# Patient Record
Sex: Male | Born: 1990 | Race: White | Hispanic: No | Marital: Single | Smoking: Former smoker
Health system: Southern US, Community
[De-identification: ages and names within clinical notes are randomized; demographics above are authoritative.]

## PROBLEM LIST (undated history)

## (undated) DIAGNOSIS — Z21 Asymptomatic human immunodeficiency virus [HIV] infection status: Secondary | ICD-10-CM

## (undated) DIAGNOSIS — Z789 Other specified health status: Secondary | ICD-10-CM

## (undated) DIAGNOSIS — C6290 Malignant neoplasm of unspecified testis, unspecified whether descended or undescended: Secondary | ICD-10-CM

## (undated) HISTORY — PX: NO PAST SURGERIES: SHX2092

## (undated) HISTORY — DX: Asymptomatic human immunodeficiency virus (hiv) infection status: Z21

## (undated) HISTORY — DX: Malignant neoplasm of unspecified testis, unspecified whether descended or undescended: C62.90

## (undated) MED FILL — Fosaprepitant Dimeglumine For IV Infusion 150 MG (Base Eq): INTRAVENOUS | Qty: 5 | Status: AC

---

## 2009-05-05 ENCOUNTER — Emergency Department (HOSPITAL_BASED_OUTPATIENT_CLINIC_OR_DEPARTMENT_OTHER): Admission: EM | Admit: 2009-05-05 | Discharge: 2009-05-05 | Payer: Self-pay | Admitting: Emergency Medicine

## 2012-02-11 ENCOUNTER — Emergency Department (HOSPITAL_BASED_OUTPATIENT_CLINIC_OR_DEPARTMENT_OTHER)
Admission: EM | Admit: 2012-02-11 | Discharge: 2012-02-11 | Disposition: A | Discharge status: Home / Self Care | Payer: BC Managed Care – PPO | Attending: Emergency Medicine | Admitting: Emergency Medicine

## 2012-02-11 ENCOUNTER — Encounter (HOSPITAL_BASED_OUTPATIENT_CLINIC_OR_DEPARTMENT_OTHER): Payer: Self-pay | Admitting: *Deleted

## 2012-02-11 ENCOUNTER — Emergency Department (HOSPITAL_BASED_OUTPATIENT_CLINIC_OR_DEPARTMENT_OTHER): Payer: BC Managed Care – PPO

## 2012-02-11 DIAGNOSIS — Y998 Other external cause status: Secondary | ICD-10-CM | POA: Insufficient documentation

## 2012-02-11 DIAGNOSIS — Y92009 Unspecified place in unspecified non-institutional (private) residence as the place of occurrence of the external cause: Secondary | ICD-10-CM | POA: Insufficient documentation

## 2012-02-11 DIAGNOSIS — S91319A Laceration without foreign body, unspecified foot, initial encounter: Secondary | ICD-10-CM

## 2012-02-11 DIAGNOSIS — Y9301 Activity, walking, marching and hiking: Secondary | ICD-10-CM | POA: Insufficient documentation

## 2012-02-11 DIAGNOSIS — S91309A Unspecified open wound, unspecified foot, initial encounter: Secondary | ICD-10-CM | POA: Insufficient documentation

## 2012-02-11 DIAGNOSIS — W268XXA Contact with other sharp object(s), not elsewhere classified, initial encounter: Secondary | ICD-10-CM | POA: Insufficient documentation

## 2012-02-11 MED ORDER — CEPHALEXIN 500 MG PO CAPS: 500.0000 mg | ORAL_CAPSULE | Freq: Four times a day (QID) | ORAL | Status: AC

## 2012-02-11 MED ORDER — CEPHALEXIN 250 MG PO CAPS
500.0000 mg | ORAL_CAPSULE | Freq: Once | ORAL | Status: AC
  Administered 2012-02-11: 500 mg via ORAL
  Filled 2012-02-11: qty 2

## 2012-02-11 MED ORDER — LIDOCAINE HCL 2 % IJ SOLN
INTRAMUSCULAR | Status: AC
  Filled 2012-02-11: qty 1

## 2012-02-11 MED ORDER — TETANUS-DIPHTH-ACELL PERTUSSIS 5-2.5-18.5 LF-MCG/0.5 IM SUSP
0.5000 mL | Freq: Once | INTRAMUSCULAR | Status: AC
  Administered 2012-02-11: 0.5 mL via INTRAMUSCULAR
  Filled 2012-02-11: qty 0.5

## 2012-02-11 NOTE — ED Provider Notes (Signed)
History     CSN: [id number]  Arrival date & time 02/11/12  0809   First MD Initiated Contact with Patient 02/11/12 [phone]      Chief Complaint  Patient presents with  . Extremity Laceration    (Consider location/radiation/quality/duration/timing/severity/associated sxs/prior treatment) HPI Pt reports while cooking out in his yard yesterday he stepped on a large piece of glass, sustaining a laceration to the bottom of his L foot. He pulled the glass out but did not seek medical attention at that time. Complaining of mild pain to bottom of foot.   History reviewed. No pertinent past medical history.  History reviewed. No pertinent past surgical history.  History reviewed. No pertinent family history.  History  Substance Use Topics  . Smoking status: Never Smoker   . Smokeless tobacco: Not on file  . Alcohol Use: No      Review of Systems All other systems reviewed and are negative except as noted in HPI.   Allergies  Review of patient's allergies indicates no known allergies.  Home Medications  No current outpatient prescriptions on file.  BP 132/72  Pulse 85  Resp 14  Ht 5\' 11"  (1.803 m)  Wt 230 lb (104.327 kg)  BMI 32.08 kg/m2  SpO2 100%  Physical Exam  Nursing note and vitals reviewed. Constitutional: He is oriented to person, place, and time. He appears well-developed and well-nourished.  HENT:  Head: Normocephalic and atraumatic.  Eyes: EOM are normal. Pupils are equal, round, and reactive to light.  Neck: Normal range of motion. Neck supple.  Cardiovascular: Normal rate, normal heart sounds and intact distal pulses.   Pulmonary/Chest: Effort normal and breath sounds normal.  Abdominal: Bowel sounds are normal. He exhibits no distension. There is no tenderness.  Musculoskeletal: Normal range of motion. He exhibits no edema and no tenderness.  Neurological: He is alert and oriented to person, place, and time. He has normal strength. No cranial nerve  deficit or sensory deficit.  Skin: Skin is warm and dry.       3cm linear laceration to bottom of L foot  Psychiatric: He has a normal mood and affect.    ED Course  Procedures (including critical care time)  Labs Reviewed - No data to display No results found.   No diagnosis found.    MDM  Discussed with patient that his injury is too remote for primary closure due to infection risk. Will check xray to eval for retained foreign body. Tdap updated. Wound irrigation and prophylactic antibiotics.         Tula Schryver B. Bernette Mayers, MD 02/11/12 [phone]

## 2012-02-11 NOTE — ED Notes (Signed)
Stepped on glass yesterday around 11am, laceration to L foot

## 2012-06-16 ENCOUNTER — Encounter (HOSPITAL_BASED_OUTPATIENT_CLINIC_OR_DEPARTMENT_OTHER): Payer: Self-pay | Admitting: *Deleted

## 2012-06-16 ENCOUNTER — Emergency Department (HOSPITAL_BASED_OUTPATIENT_CLINIC_OR_DEPARTMENT_OTHER)
Admission: EM | Admit: 2012-06-16 | Discharge: 2012-06-16 | Disposition: A | Discharge status: Home / Self Care | Payer: BC Managed Care – PPO | Attending: Emergency Medicine | Admitting: Emergency Medicine

## 2012-06-16 DIAGNOSIS — F4320 Adjustment disorder, unspecified: Secondary | ICD-10-CM

## 2012-06-16 DIAGNOSIS — F39 Unspecified mood [affective] disorder: Secondary | ICD-10-CM | POA: Insufficient documentation

## 2012-06-16 DIAGNOSIS — R630 Anorexia: Secondary | ICD-10-CM | POA: Insufficient documentation

## 2012-06-16 DIAGNOSIS — F17200 Nicotine dependence, unspecified, uncomplicated: Secondary | ICD-10-CM | POA: Insufficient documentation

## 2012-06-16 LAB — CBC WITH DIFFERENTIAL/PLATELET
Basophils Absolute: 0 K/uL (ref 0.0–0.1)
Basophils Relative: 0 % (ref 0–1)
Eosinophils Absolute: 0.2 K/uL (ref 0.0–0.7)
Eosinophils Relative: 2 % (ref 0–5)
HCT: 43.5 % (ref 39.0–52.0)
Hemoglobin: 15.1 g/dL (ref 13.0–17.0)
Lymphocytes Relative: 31 % (ref 12–46)
Lymphs Abs: 3.2 K/uL (ref 0.7–4.0)
MCH: 29.8 pg (ref 26.0–34.0)
MCHC: 34.7 g/dL (ref 30.0–36.0)
MCV: 85.8 fL (ref 78.0–100.0)
Monocytes Absolute: 1 K/uL (ref 0.1–1.0)
Monocytes Relative: 9 % (ref 3–12)
Neutro Abs: 6 K/uL (ref 1.7–7.7)
Neutrophils Relative %: 57 % (ref 43–77)
Platelets: 150 K/uL (ref 150–400)
RBC: 5.07 MIL/uL (ref 4.22–5.81)
RDW: 13 % (ref 11.5–15.5)
WBC: 10.4 K/uL (ref 4.0–10.5)

## 2012-06-16 LAB — BASIC METABOLIC PANEL WITH GFR
BUN: 11 mg/dL (ref 6–23)
CO2: 23 meq/L (ref 19–32)
Calcium: 9.4 mg/dL (ref 8.4–10.5)
Chloride: 102 meq/L (ref 96–112)
Creatinine, Ser: 1 mg/dL (ref 0.50–1.35)
GFR calc Af Amer: >90 mL/min (ref >90)
GFR calc non Af Amer: >90 mL/min (ref >90)
Glucose, Bld: 97 mg/dL (ref 70–99)
Potassium: 3.6 meq/L (ref 3.5–5.1)
Sodium: 138 meq/L (ref 135–145)

## 2012-06-16 LAB — RAPID URINE DRUG SCREEN, HOSP PERFORMED
Amphetamines: NOT DETECTED
Barbiturates: NOT DETECTED
Tetrahydrocannabinol: POSITIVE — AB

## 2012-06-16 NOTE — ED Notes (Signed)
Pt states that he has been feeling "down", states that he has been feeling depressed.  Pt states that this feeling began 4-5 days ago.  No specific event triggered this feeling.  Pt states that he has also been having generalized body aches and dizzyness with this.  Pt denies any SI or HI

## 2012-06-16 NOTE — ED Notes (Signed)
Called [facility] to have community resource information sent over to be given to pt.  I spoke with Trey Paula.

## 2012-06-16 NOTE — ED Notes (Signed)
[facility] called. 

## 2012-06-16 NOTE — ED Provider Notes (Signed)
History  This chart was scribed for Sequoyah Counterman Smitty Cords, MD by Ladona Ridgel Day. This patient was seen in room MH05/MH05 and the patient's care was started at 0014.   CSN: [id number]  Arrival date & time 06/16/12  0014   First MD Initiated Contact with Patient 06/16/12 0021      Chief Complaint  Patient presents with  . Depression   Patient is a 21 y.o. male presenting with mental health disorder. The history is provided by the patient. No language interpreter was used.  Mental Health Problem The primary symptoms include dysphoric mood. The primary symptoms do not include delusions, hallucinations, bizarre behavior, negative symptoms or somatic symptoms. The current episode started this week. This is a new problem.  The onset of the illness is precipitated by emotional stress. The degree of incapacity that he is experiencing as a consequence of his illness is mild. Sequelae: none. Additional symptoms of the illness include appetite change (decreased appetite). Additional symptoms of the illness do not include no anhedonia, no insomnia, no hypersomnia, no unexpected weight change, no fatigue, no agitation, no flight of ideas, no abdominal pain or no seizures. He does not admit to suicidal ideas. He does not have a plan to commit suicide. He does not contemplate harming himself. He has not already injured self. He does not contemplate injuring another person. He has not already  injured another person. Risk factors: none.   Steve Stewart is a 21 y.o. male who presents to the Emergency Department complaining of constant gradually worsening feeling down and depressed over the past 5 days associated with long work hours. He states associated sx of feeling shaky. He states no specific set of events that have led to him feeling down now except that he has been working long hours at work. He denies any SI/HI, hearing voices, hallucinations, fever, emesis, rash, diarrhea. He has not yet seen anyone for this  problem. He denies any history or family history of mental health problems. He takes no medicines, has no medical history, no allergies, no surgeries, no recent travel.   History reviewed. No pertinent past medical history.  History reviewed. No pertinent past surgical history.  No family history on file.  History  Substance Use Topics  . Smoking status: Current Every Day Smoker -- 0.5 packs/day    Types: Cigarettes  . Smokeless tobacco: Not on file  . Alcohol Use: No      Review of Systems  Constitutional: Positive for appetite change (decreased appetite). Negative for fever, chills, fatigue and unexpected weight change.  Respiratory: Negative for shortness of breath.   Gastrointestinal: Negative for nausea, vomiting and abdominal pain.  Neurological: Negative for seizures and weakness.  Psychiatric/Behavioral: Positive for dysphoric mood. Negative for suicidal ideas, hallucinations, behavioral problems, self-injury and agitation. The patient is not nervous/anxious and does not have insomnia.   All other systems reviewed and are negative.    Allergies  Review of patient's allergies indicates no known allergies.  Home Medications  No current outpatient prescriptions on file.  Triage vitals: BP 128/78  Pulse 92  Temp 98.6 F (37 C) (Oral)  Resp 16  Ht 5\' 11"  (1.803 m)  Wt 217 lb (98.431 kg)  BMI 30.27 kg/m2  SpO2 100%  Physical Exam  Nursing note and vitals reviewed. Constitutional: He is oriented to person, place, and time. He appears well-developed and well-nourished. No distress.  HENT:  Head: Normocephalic and atraumatic.  Mouth/Throat: Oropharynx is clear and moist.  Eyes: EOM are  normal. Pupils are equal, round, and reactive to light.  Neck: Neck supple. No tracheal deviation present.  Cardiovascular: Normal rate, regular rhythm and normal heart sounds.   No murmur heard. Pulmonary/Chest: Effort normal and breath sounds normal. No respiratory distress. He  has no wheezes. He has no rales.  Abdominal: Soft. Bowel sounds are normal. He exhibits no distension. There is no tenderness. There is no rebound and no guarding.  Musculoskeletal: Normal range of motion.  Neurological: He is alert and oriented to person, place, and time.  Skin: Skin is warm and dry.  Psychiatric: He has a normal mood and affect. His behavior is normal.    ED Course  Procedures (including critical care time) DIAGNOSTIC STUDIES: Oxygen Saturation is 100% on room air, normal by my interpretation.    COORDINATION OF CARE: At 1240 AM Discussed treatment plan with patient which includes UD, blood work. Patient agrees.   Labs Reviewed - No data to display No results found.   No diagnosis found.    MDM   no si or hi no indications for admission.  Community resources given.    I personally performed the services described in this documentation, which was scribed in my presence. The recorded information has been reviewed and considered.           Jasmine Awe, MD 06/16/12 [phone]

## 2012-08-30 ENCOUNTER — Emergency Department (HOSPITAL_BASED_OUTPATIENT_CLINIC_OR_DEPARTMENT_OTHER)
Admission: EM | Admit: 2012-08-30 | Discharge: 2012-08-30 | Disposition: A | Discharge status: Home / Self Care | Payer: BC Managed Care – PPO | Attending: Emergency Medicine | Admitting: Emergency Medicine

## 2012-08-30 ENCOUNTER — Encounter (HOSPITAL_BASED_OUTPATIENT_CLINIC_OR_DEPARTMENT_OTHER): Payer: Self-pay | Admitting: Emergency Medicine

## 2012-08-30 DIAGNOSIS — M62838 Other muscle spasm: Secondary | ICD-10-CM | POA: Insufficient documentation

## 2012-08-30 DIAGNOSIS — F17200 Nicotine dependence, unspecified, uncomplicated: Secondary | ICD-10-CM | POA: Insufficient documentation

## 2012-08-30 MED ORDER — IBUPROFEN 800 MG PO TABS: 800.0000 mg | ORAL_TABLET | Freq: Three times a day (TID) | ORAL | Status: DC

## 2012-08-30 MED ORDER — IBUPROFEN 800 MG PO TABS
800.0000 mg | ORAL_TABLET | Freq: Once | ORAL | Status: AC
  Administered 2012-08-30: 800 mg via ORAL
  Filled 2012-08-30: qty 1

## 2012-08-30 MED ORDER — DIAZEPAM 5 MG PO TABS: 5.0000 mg | ORAL_TABLET | Freq: Two times a day (BID) | ORAL | Status: DC

## 2012-08-30 MED ORDER — HYDROCODONE-ACETAMINOPHEN 5-325 MG PO TABS: 1.0000 | ORAL_TABLET | Freq: Four times a day (QID) | ORAL | Status: DC | PRN

## 2012-08-30 NOTE — ED Provider Notes (Signed)
History     CSN: [id number]  Arrival date & time 08/30/12  0865   First MD Initiated Contact with Patient 08/30/12 [phone]      Chief Complaint  Patient presents with  . Shoulder Pain    (Consider location/radiation/quality/duration/timing/severity/associated sxs/prior treatment) HPICorey Stewart is a 22 y.o. male says that he was moving heavy furniture approximately second or third of January over the course of the last couple days he's had an increasingly worse pain in his right trapezius. He's had difficulty sleeping because the pain tonight. Pain is severe, sharp, worse when he elevates his shoulder, or presses on it. No fevers, no chills, no numbness, tingling, no weakness in the right arm.  No cough, chest pain, shortness of breath, abdominal pain, nausea vomiting or diarrhea. Patient has not had relief with 600 mg of ibuprofen which he took once earlier today.   History reviewed. No pertinent past medical history.  History reviewed. No pertinent past surgical history.  No family history on file.  History  Substance Use Topics  . Smoking status: Current Every Day Smoker -- 0.5 packs/day    Types: Cigarettes  . Smokeless tobacco: Not on file  . Alcohol Use: No      Review of Systems At least 10pt or greater review of systems completed and are negative except where specified in the HPI.  Allergies  Review of patient's allergies indicates no known allergies.  Home Medications   Current Outpatient Rx  Name  Route  Sig  Dispense  Refill  . GOODY HEADACHE PO   Oral   Take by mouth as needed.         . IBUPROFEN 200 MG PO TABS   Oral   Take 400 mg by mouth as needed.           BP 137/86  Pulse 100  Temp 98.6 F (37 C) (Oral)  Resp 18  Ht 6\' 1"  (1.854 m)  Wt 215 lb (97.523 kg)  BMI 28.37 kg/m2  SpO2 100%  Physical Exam  Nursing notes reviewed.  Electronic medical record reviewed. VITAL SIGNS:   Filed Vitals:   08/30/12 0355  BP: 137/86  Pulse: 100    Temp: 98.6 F (37 C)  TempSrc: Oral  Resp: 18  Height: 6\' 1"  (1.854 m)  Weight: 215 lb (97.523 kg)  SpO2: 100%   CONSTITUTIONAL: Awake, oriented, appears non-toxic HENT: Atraumatic, normocephalic, oral mucosa pink and moist, airway patent. Nares patent without drainage. External ears normal. EYES: Conjunctiva clear, EOMI, PERRLA NECK: Trachea midline, non-tender, supple CARDIOVASCULAR: Normal heart rate, Normal rhythm, No murmurs, rubs, gallops PULMONARY/CHEST: Clear to auscultation, no rhonchi, wheezes, or rales. Symmetrical breath sounds. Non-tender. Palpable muscle spasm in the right trapezius muscle. ABDOMINAL: Non-distended, soft, non-tender - no rebound or guarding.  BS normal. NEUROLOGIC: Non-focal, moving all four extremities, no gross sensory or motor deficits. EXTREMITIES: No clubbing, cyanosis, or edema SKIN: Warm, Dry, No erythema, No rash  ED Course  Procedures (including critical care time)  Labs Reviewed - No data to display No results found.   1. Trapezius muscle spasm       MDM  [name] is a 22 y.o. male presenting with trapezius muscle spasm. Patient has a history of exertion, and has palpable muscle spasm in the right trapezius. Vital signs are unremarkable-pulse is 100 this could be due to discomfort. I do not think is significant of any occult infection or injury.  Pain does not seem out of proportion to  exam - I doubt a deep tissue infection.  I do not think any labs or imaging is indicated at this time.  We'll send the patient home with conservative pain measures to  I explained the diagnosis and have given explicit precautions to return to the ER including fever or chills, worsening pain despite medication or any other new or worsening symptoms. The patient understands and accepts the medical plan as it's been dictated and I have answered their questions. Discharge instructions concerning home care and prescriptions have been given.  The patient is  STABLE and is discharged to home in good condition.          Jones Skene, MD 08/30/12 [phone]

## 2012-08-30 NOTE — ED Notes (Signed)
Pt c/o right shoulder pain x several days. Pt states he has been lifting heavy furniture.

## 2013-04-13 IMAGING — CR DG FOOT 2V*L*
2 series · 2 of 2 positions shown · non-contrast
Comparison: None

CLINICAL DATA: Left foot pain following injury/laceration.
Evaluate for retained foreign body/glass.

LEFT FOOT - 2 VIEW

[t foot ap left]
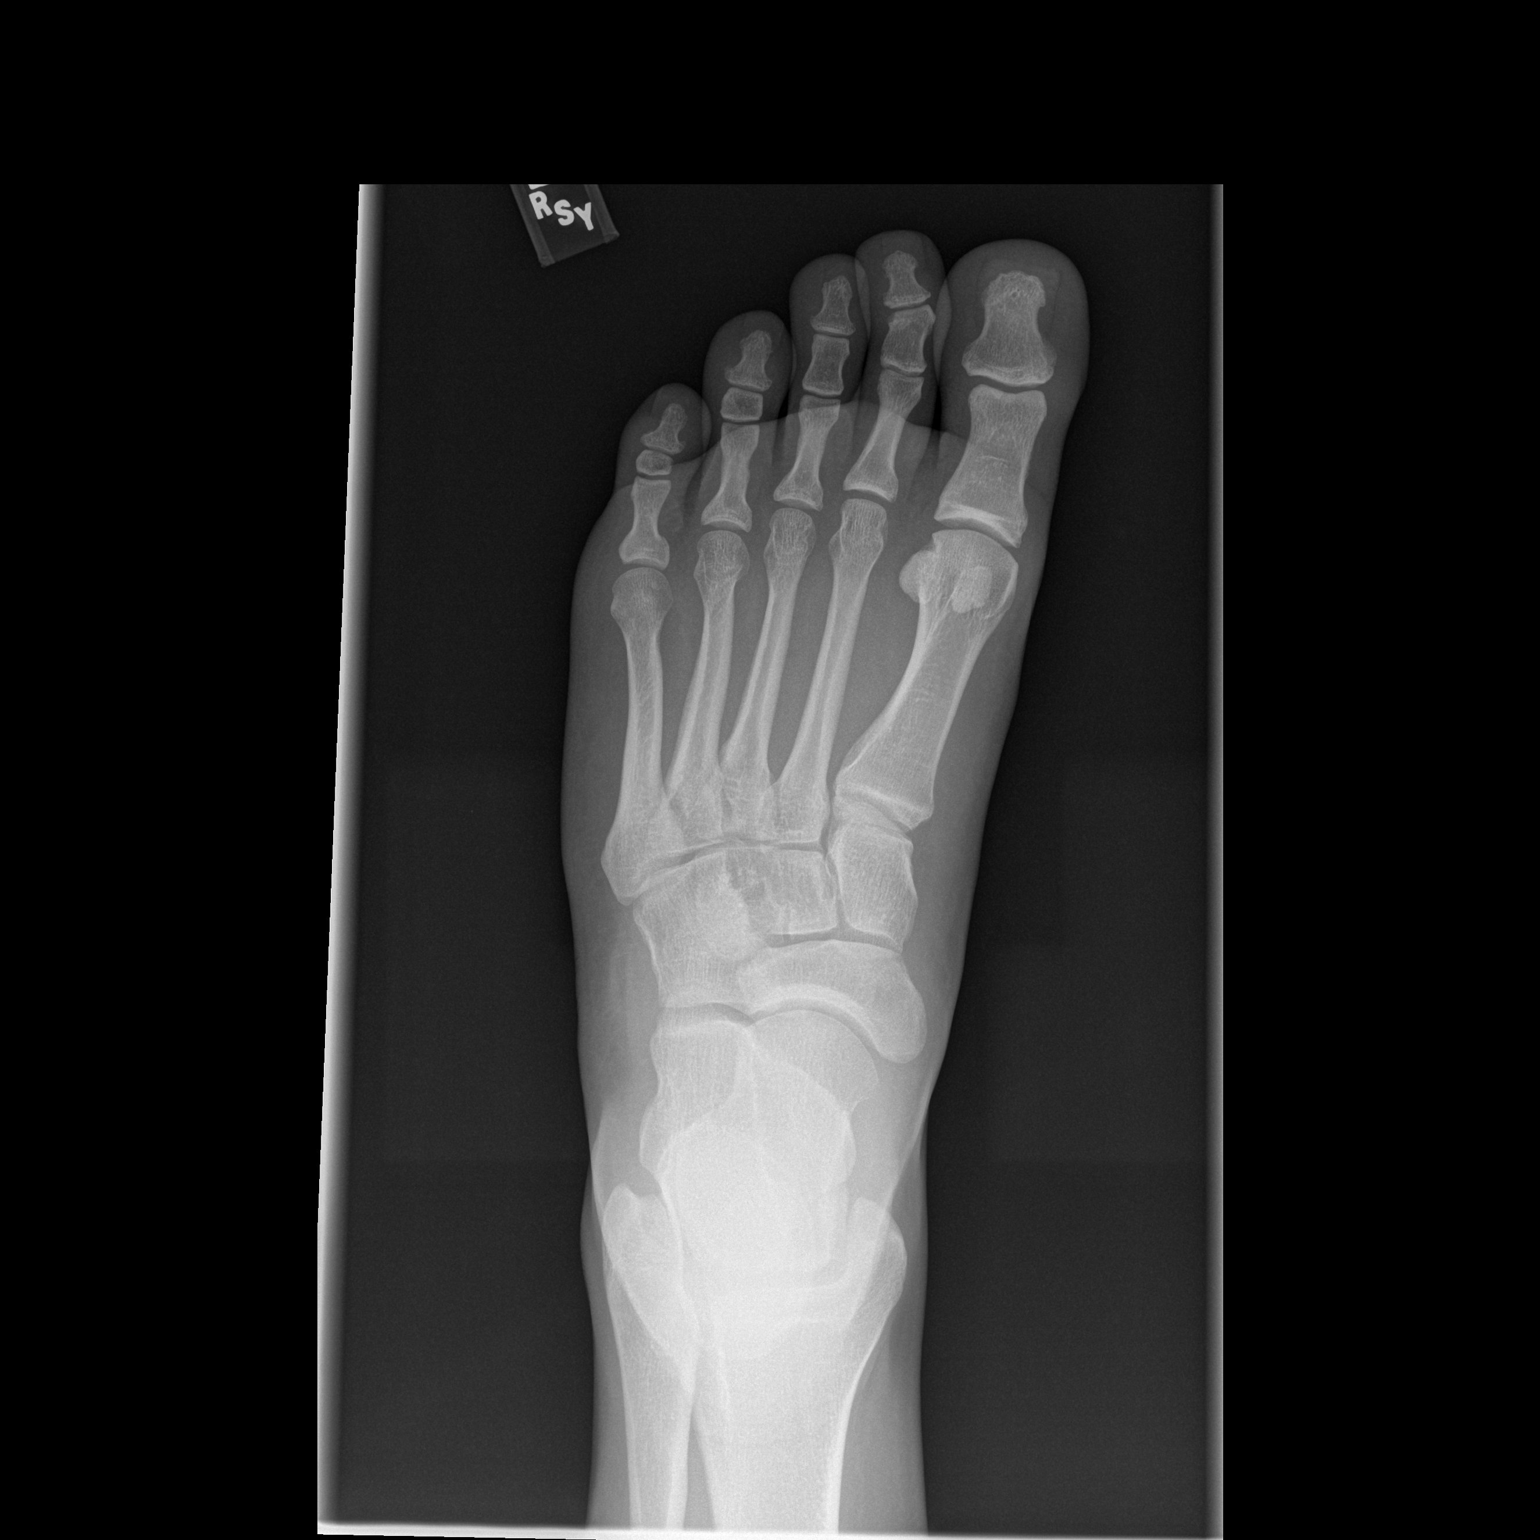

[t foot lat left]
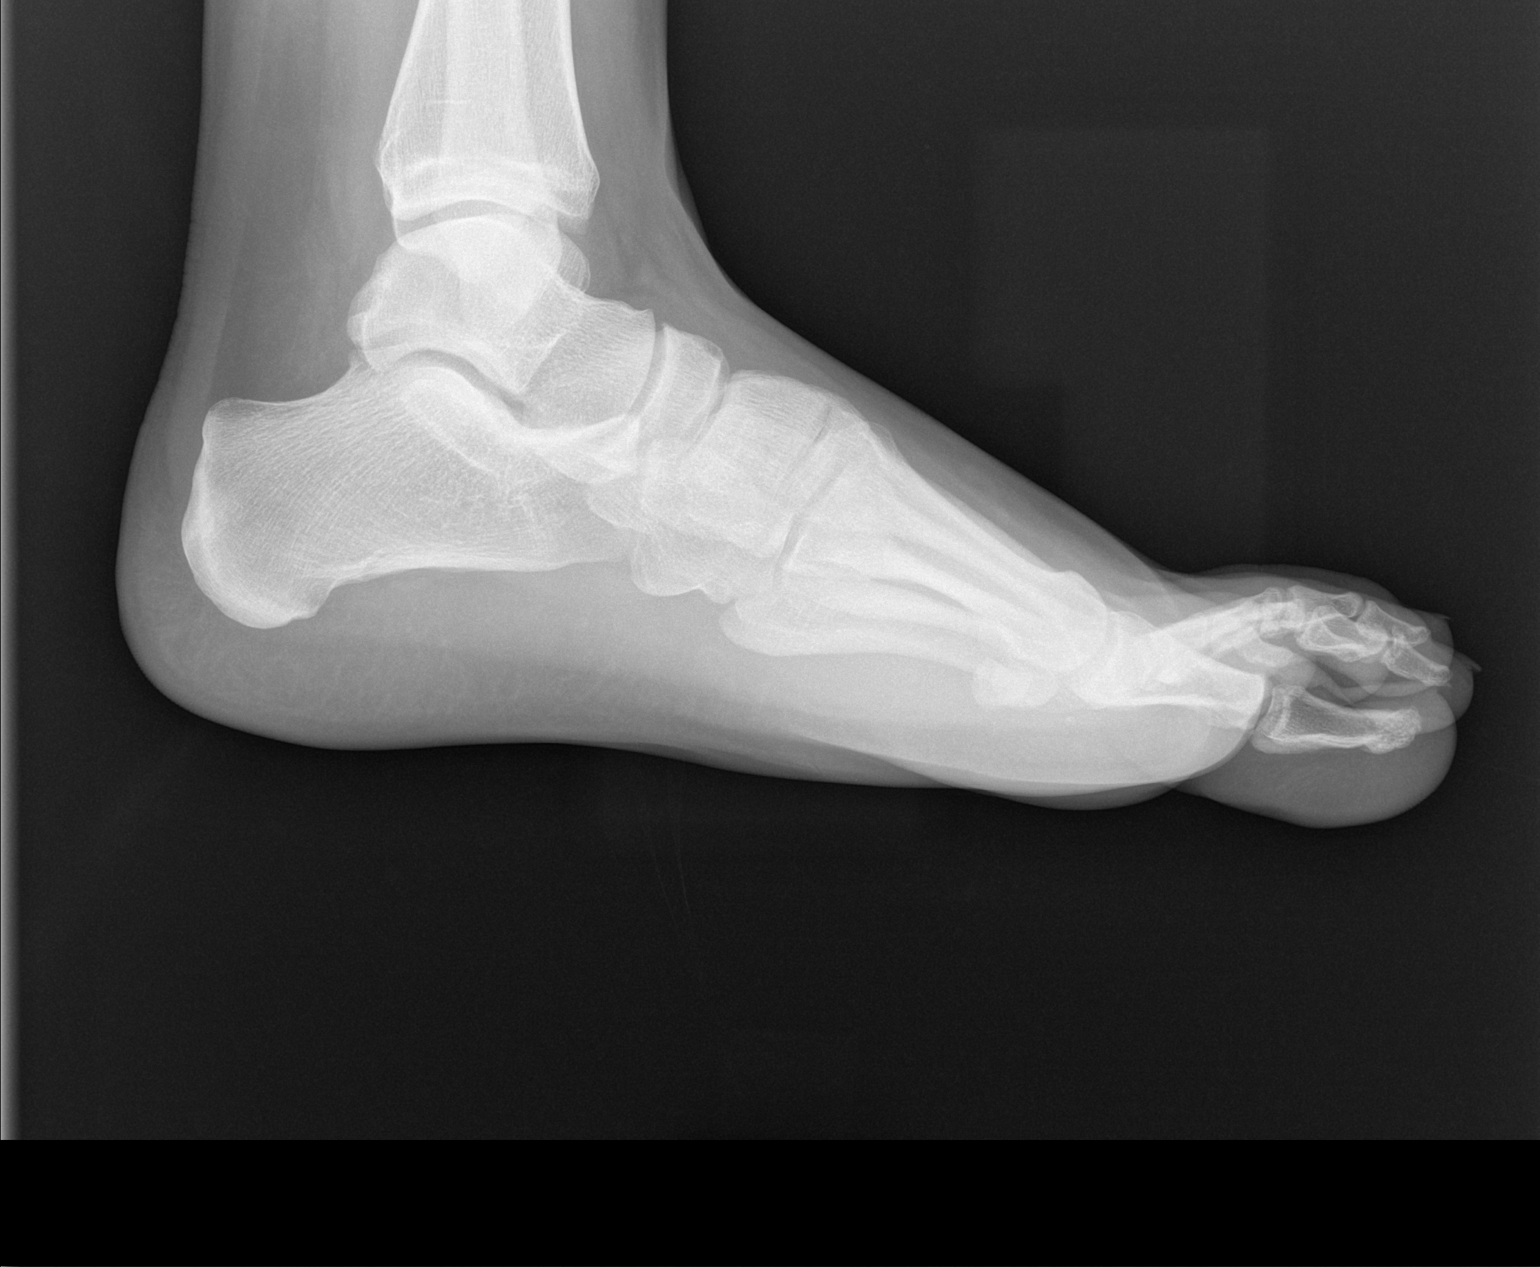

[2 of 2 positions shown; findings below may reference images not displayed]

FINDINGS: There is no evidence of acute fracture, subluxation, or
dislocation.
The Lisfranc joints are intact.
No focal bony lesions are identified.
There is no evidence of radiopaque foreign body.

The joint spaces are unremarkable.
IMPRESSION: No evidence of acute bony abnormality or radiopaque foreign body.

## 2017-07-03 ENCOUNTER — Emergency Department (HOSPITAL_BASED_OUTPATIENT_CLINIC_OR_DEPARTMENT_OTHER)
Admission: EM | Admit: 2017-07-03 | Discharge: 2017-07-03 | Disposition: A | Discharge status: Home / Self Care | Payer: Self-pay | Attending: Emergency Medicine | Admitting: Emergency Medicine

## 2017-07-03 ENCOUNTER — Encounter (HOSPITAL_BASED_OUTPATIENT_CLINIC_OR_DEPARTMENT_OTHER): Payer: Self-pay

## 2017-07-03 ENCOUNTER — Other Ambulatory Visit: Payer: Self-pay

## 2017-07-03 ENCOUNTER — Emergency Department (HOSPITAL_BASED_OUTPATIENT_CLINIC_OR_DEPARTMENT_OTHER): Payer: Self-pay

## 2017-07-03 DIAGNOSIS — M7121 Synovial cyst of popliteal space [Baker], right knee: Secondary | ICD-10-CM

## 2017-07-03 DIAGNOSIS — M79604 Pain in right leg: Secondary | ICD-10-CM

## 2017-07-03 DIAGNOSIS — F17210 Nicotine dependence, cigarettes, uncomplicated: Secondary | ICD-10-CM | POA: Insufficient documentation

## 2017-07-03 MED ORDER — NAPROXEN 375 MG PO TABS
375.0000 mg | ORAL_TABLET | Freq: Two times a day (BID) | ORAL | 0 refills | Status: DC
Start: 2017-07-03 — End: 2023-04-30

## 2017-07-03 NOTE — Discharge Instructions (Signed)
Pick up compression stocking (thigh high) at the pharmacy. Follow-up with orthopedics as discussed.

## 2017-07-03 NOTE — ED Notes (Signed)
ED Provider at bedside. 

## 2017-07-03 NOTE — ED Provider Notes (Signed)
[facility] EMERGENCY DEPARTMENT Provider Note   CSN: [id number] Arrival date & time: 07/03/17  1842     History   Chief Complaint Chief Complaint  Patient presents with  . Leg Pain    HPI Steve Stewart is a 26 y.o. male.  Patient reporting pain that originates in ankle and radiates to the mid thigh. 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 He has also increased physical activity with frequent climbing of a ladder. His pain is most pronounced in the right calf/knee, with tenderness to palpation. He also has pain to his lower/mid thigh and foot.   The history is provided by the patient. No language interpreter was used.  Leg Pain   This is a new problem. The current episode started more than 1 week ago. The problem has been gradually worsening. The quality of the pain is described as aching. The pain is moderate. Pertinent negatives include no numbness and full range of motion.    History reviewed. No pertinent past medical history.  There are no active problems to display for this patient.   History reviewed. No pertinent surgical history.     Home Medications    Prior to Admission medications   Not on File    Family History No family history on file.  Social History Social History   Tobacco Use  . Smoking status: Current Every Day Smoker    Packs/day: 0.50    Types: Cigarettes  . Smokeless tobacco: Never Used  Substance Use Topics  . Alcohol use: No  . Drug use: No     Allergies   Patient has no known allergies.   Review of Systems Review of Systems  Musculoskeletal: Positive for myalgias.  Neurological: Negative for numbness.  All other systems reviewed and are negative.    Physical Exam Updated Vital Signs BP (!) 133/91 (BP Location: Left Arm)   Pulse 99   Temp 98.5 F (36.9 C) (Oral)   Resp 18   Ht 5\' 11"  (1.803 m)   Wt 112 kg (246 lb 14.6 oz)   SpO2 98%   BMI 34.44  kg/m   Physical Exam  Constitutional: He appears well-developed and well-nourished.  HENT:  Head: Normocephalic and atraumatic.  Eyes: Conjunctivae are normal.  Neck: Neck supple.  Cardiovascular: Normal rate and regular rhythm.  No murmur heard. Pulmonary/Chest: Effort normal and breath sounds normal. No respiratory distress.  Abdominal: Soft. There is no tenderness.  Musculoskeletal: He exhibits tenderness. He exhibits no edema.  Positive homans' right calf  Neurological: He is alert.  Skin: Skin is warm and dry.  Psychiatric: He has a normal mood and affect.  Nursing note and vitals reviewed.    ED Treatments / Results  Labs (all labs ordered are listed, but only abnormal results are displayed) Labs Reviewed - No data to display  EKG  EKG Interpretation None       Radiology [place] Venous Img Lower Unilateral Right  Result Date: 07/03/2017 CLINICAL DATA:  Right calf pain and edema for 1-2 weeks EXAM: Right LOWER EXTREMITY VENOUS DOPPLER ULTRASOUND TECHNIQUE: Gray-scale sonography with graded compression, as well as color Doppler and duplex ultrasound were performed to evaluate the lower extremity deep venous systems from the level of the common femoral vein and including the common femoral, femoral, profunda femoral, popliteal and calf veins including the posterior tibial, peroneal and gastrocnemius veins when visible. The superficial great saphenous vein was  also interrogated. Spectral Doppler was utilized to evaluate flow at rest and with distal augmentation maneuvers in the common femoral, femoral and popliteal veins. COMPARISON:  None. FINDINGS: Contralateral Common Femoral Vein: Respiratory phasicity is normal and symmetric with the symptomatic side. No evidence of thrombus. Normal compressibility. Common Femoral Vein: No evidence of thrombus. Normal compressibility, respiratory phasicity and response to augmentation. Saphenofemoral Junction: No evidence of thrombus. Normal  compressibility and flow on color Doppler imaging. Profunda Femoral Vein: No evidence of thrombus. Normal compressibility and flow on color Doppler imaging. Femoral Vein: No evidence of thrombus. Normal compressibility, respiratory phasicity and response to augmentation. Popliteal Vein: No evidence of thrombus. Normal compressibility, respiratory phasicity and response to augmentation. Calf Veins: No evidence of thrombus. Normal compressibility and flow on color Doppler imaging. Superficial Great Saphenous Vein: No evidence of thrombus. Normal compressibility. Venous Reflux:  None. Other Findings: Hypoechoic fluid collection medial and posterior knee measuring 2 x 2.3 x 3.5 cm consistent with popliteal fossa cyst. IMPRESSION: 1. Negative for acute deep venous thrombosis of the right lower extremity 2. Fluid collection posterior and medial knee consistent with popliteal fossa cyst Electronically Signed   By: Jasmine PangKim  Fujinaga M.D.   On: 07/03/2017 22:35    Procedures Procedures (including critical care time)  Medications Ordered in ED Medications - No data to display   Initial Impression / Assessment and Plan / ED Course  I have reviewed the triage vital signs and the nursing notes.  Pertinent labs & imaging results that were available during my care of the patient were reviewed by me and considered in my medical decision making (see chart for details).     Patient US negative for DVT. Baker cyst identified. Suspect majority of pain is muscular in origin due to recent change in activity. Pt advised to follow up with orthopedics if knee pain persists or worsens.. Conservative therapy recommended and discussed. Patient will be discharged home & is agreeable with above plan. Returns precautions discussed. Pt appears safe for discharge.  Final Clinical Impressions(s) / ED Diagnoses   Final diagnoses:  Right leg pain  Baker's cyst of knee, right    ED Discharge Orders        Ordered    naproxen  (NAPROSYN) 375 MG tablet  2 times daily     07/03/17 2306       Felicie MornSmith, Destani Wamser, NP 07/04/17 0007    Rolland PorterJames, Mark, MD 07/08/17 [phone]

## 2017-07-03 NOTE — ED Triage Notes (Signed)
C/o pain to right LE from mid thigh to foot x 1.5 weeks ago-denies injury-NAD-liming gait

## 2017-07-03 NOTE — ED Notes (Signed)
ED Provider at bedside discussing test results and dispo plan of care. 

## 2023-04-28 ENCOUNTER — Emergency Department (HOSPITAL_COMMUNITY): Payer: Medicaid Other

## 2023-04-28 ENCOUNTER — Emergency Department (HOSPITAL_COMMUNITY)
Admission: EM | Admit: 2023-04-28 | Discharge: 2023-04-28 | Disposition: A | Discharge status: Home / Self Care | Payer: Medicaid Other | Attending: Emergency Medicine | Admitting: Emergency Medicine

## 2023-04-28 ENCOUNTER — Encounter (HOSPITAL_COMMUNITY): Payer: Self-pay

## 2023-04-28 DIAGNOSIS — N50812 Left testicular pain: Secondary | ICD-10-CM | POA: Insufficient documentation

## 2023-04-28 LAB — BASIC METABOLIC PANEL
Anion gap: 8 (ref 5–15)
BUN: 14 mg/dL (ref 6–20)
CO2: 22 mmol/L (ref 22–32)
Calcium: 8.6 mg/dL — ABNORMAL LOW (ref 8.9–10.3)
Chloride: 105 mmol/L (ref 98–111)
Creatinine, Ser: 0.97 mg/dL (ref 0.61–1.24)
GFR, Estimated: >60 mL/min (ref >60)
Glucose, Bld: 98 mg/dL (ref 70–99)
Potassium: 4.3 mmol/L (ref 3.5–5.1)
Sodium: 135 mmol/L (ref 135–145)

## 2023-04-28 LAB — CBC WITH DIFFERENTIAL/PLATELET
Abs Immature Granulocytes: 0.01 10*3/uL (ref 0.00–0.07)
Basophils Absolute: 0 10*3/uL (ref 0.0–0.1)
Basophils Relative: 0 %
Eosinophils Absolute: 0.1 10*3/uL (ref 0.0–0.5)
Eosinophils Relative: 1 %
HCT: 43.3 % (ref 39.0–52.0)
Hemoglobin: 14.7 g/dL (ref 13.0–17.0)
Immature Granulocytes: 0 %
Lymphocytes Relative: 32 %
Lymphs Abs: 1.9 10*3/uL (ref 0.7–4.0)
MCH: 29.6 pg (ref 26.0–34.0)
MCHC: 33.9 g/dL (ref 30.0–36.0)
MCV: 87.1 fL (ref 80.0–100.0)
Monocytes Absolute: 0.5 10*3/uL (ref 0.1–1.0)
Monocytes Relative: 8 %
Neutro Abs: 3.5 10*3/uL (ref 1.7–7.7)
Neutrophils Relative %: 59 %
Platelets: 104 10*3/uL — ABNORMAL LOW (ref 150–400)
RBC: 4.97 MIL/uL (ref 4.22–5.81)
RDW: 14.6 % (ref 11.5–15.5)
WBC: 5.9 10*3/uL (ref 4.0–10.5)
nRBC: 0 % (ref 0.0–0.2)

## 2023-04-28 LAB — URINALYSIS, W/ REFLEX TO CULTURE (INFECTION SUSPECTED)
Bacteria, UA: NONE SEEN
Bilirubin Urine: NEGATIVE
Glucose, UA: NEGATIVE mg/dL
Hgb urine dipstick: NEGATIVE
Ketones, ur: NEGATIVE mg/dL
Leukocytes,Ua: NEGATIVE
Nitrite: NEGATIVE
Protein, ur: NEGATIVE mg/dL
Specific Gravity, Urine: 1.011 (ref 1.005–1.030)
pH: 5 (ref 5.0–8.0)

## 2023-04-28 LAB — LACTATE DEHYDROGENASE: LDH: 199 U/L — ABNORMAL HIGH (ref 98–192)

## 2023-04-28 MED ORDER — OXYCODONE HCL 5 MG PO TABS
5.0000 mg | ORAL_TABLET | Freq: Once | ORAL | Status: AC
  Administered 2023-04-28: 5 mg via ORAL
  Filled 2023-04-28: qty 1

## 2023-04-28 MED ORDER — LIDOCAINE HCL (PF) 1 % IJ SOLN
20.0000 mL | Freq: Once | INTRAMUSCULAR | Status: AC
  Administered 2023-04-28: 20 mL via INTRADERMAL
  Filled 2023-04-28: qty 20

## 2023-04-28 MED ORDER — IOHEXOL 350 MG/ML SOLN
75.0000 mL | Freq: Once | INTRAVENOUS | Status: AC | PRN
  Administered 2023-04-28: 75 mL via INTRAVENOUS

## 2023-04-28 NOTE — Consult Note (Signed)
Urology Consult Note   Requesting Attending Physician:  Wynetta Fines, MD Service Providing Consult: Urology  Consulting Attending: Rhoderick Moody, MD   Reason for Consult:  testicular pain  HPI: Steve Stewart is seen in consultation for reasons noted above at the request of Wynetta Fines, MD  for evaluation of testicular pain.  This is a 32 y.o. male with no significant medical history who presents with a week of left testicular pain. He has noted his left testicle growing in size for the past two months, and had new left testicular pain last week. He went to an OSH ED x2 for pain and was discharged on antibiotics. He had a scrotal ultrasound done there and was told he may have testicular cancer and was instructed to follow up with urology.  Overnight he developed worsening of pain and new testicular bruising prompting presentation to [facility] ED. Scrotal ultrasound notable for a heterogeneous left testicle c/f neoplasm. Physical exam shows a firm, enlarged left testicle with an associated area of ecchymosis/fluctuance on the left inferior aspect of his scrotum.   He takes no blood thinners, has no surgical history, no family history of testicular cancer.   We discussed his findings which are highly concerning for testicular malignancy. Our recommendation is for left radical orchiectomy. We discussed the surgical approach and recovery in detail, as well as testosterone/fertility with a single testicle. We also discussed obtaining tumor markers/staging scans while in the ED. We will plan for orchiectomy on Monday.    Past Medical History: History reviewed. No pertinent past medical history.  Past Surgical History:  History reviewed. No pertinent surgical history.  Medication: No current facility-administered medications for this encounter.   Current Outpatient Medications  Medication Sig Dispense Refill   naproxen (NAPROSYN) 375 MG tablet Take 1 tablet (375 mg total) 2  (two) times daily by mouth. 20 tablet 0    Allergies: No Known Allergies  Social History: Social History   Tobacco Use   Smoking status: Every Day    Current packs/day: 0.50    Types: Cigarettes   Smokeless tobacco: Never  Substance Use Topics   Alcohol use: No   Drug use: No    Family History History reviewed. No pertinent family history.  Review of Systems 10 systems were reviewed and are negative except as noted specifically in the HPI.  Objective   Vital signs in last 24 hours: BP (!) 132/94   Pulse 83   Temp (!) 97.5 F (36.4 C)   Resp 18   Ht 5\' 11"  (1.803 m)   Wt 120.2 kg   SpO2 100%   BMI 36.96 kg/m   Physical Exam General: NAD, A&O, resting, appropriate HEENT: [place]/AT, EOMI, MMM Pulmonary: Normal work of breathing Cardiovascular: HDS, adequate peripheral perfusion Abdomen: Soft, NTTP, nondistended. GU: Right testicle soft. Left testicle enlarged and firm, with area of fluctuance on inferior lateral aspect with overlying ecchymosis.  Extremities: warm and well perfused Neuro: Appropriate, no focal neurological deficits  Most Recent Labs: Lab Results  Component Value Date   WBC 5.9 04/28/2023   HGB 14.7 04/28/2023   HCT 43.3 04/28/2023   PLT 104 (L) 04/28/2023    Lab Results  Component Value Date   NA 135 04/28/2023   K 4.3 04/28/2023   CL 105 04/28/2023   CO2 22 04/28/2023   BUN 14 04/28/2023   CREATININE 0.97 04/28/2023   CALCIUM 8.6 (L) 04/28/2023    No results found for: "INR", "APTT"  Urine Culture: @[id number] (laburin,org,[id number],[id number])@   IMAGING: US SCROTUM W/DOPPLER  Result Date: 04/28/2023 CLINICAL DATA:  32 year old male with left side scrotal pain. EXAM: SCROTAL ULTRASOUND DOPPLER ULTRASOUND OF THE TESTICLES TECHNIQUE: Complete ultrasound examination of the testicles, epididymis, and other scrotal structures was performed. Color and spectral Doppler ultrasound were also utilized to evaluate blood flow to the testicles.  COMPARISON:  None Available. FINDINGS: Right testicle Measurements: 4.8 x 3.1 x 2.8 cm. No mass or microlithiasis visualized. Left testicle Measurements: 6.4 x 4.8 x 4.6 cm. In addition to being asymmetrically enlarged the left testis echogenicity is highly heterogeneous (series 1, image 23 and see cine series 2 images). There is maintained vascularity within the testis. Although not with the intense color Doppler hypervascularity typically seen with orchitis. Right epididymis:  Normal in size and appearance. Left epididymis:  Normal in size and appearance. Hydrocele:  None visualized. Varicocele:  None visualized. Pulsed Doppler interrogation of both testes demonstrates normal low resistance arterial and venous waveforms bilaterally. IMPRESSION: 1. Positive for abnormally enlarged and heterogeneous but vascular Left Testis highly suspicious for Testicular Neoplasm. Recommend Urology consultation. 2. Negative for testicular torsion.  Right testis appears normal. Electronically Signed   By: Odessa Fleming M.D.   On: 04/28/2023 10:25    ------  Assessment:  32 y.o. male with left testicular pain and imaging/physical exam findings concerning for testicular cancer.   Recommendations: - Tumor markers ordered (AFP, LDH, bHCG) - staging scans ordered (CT chest/abdomen/pelvis) - Patient will be scheduled for radical left orchiectomy on Monday, 04/30/23. He was instructed to remain NPO after midnight on Sunday night.    Thank you for this consult. Please contact the urology consult pager with any further questions/concerns.

## 2023-04-28 NOTE — ED Notes (Signed)
Pt verbalized understanding of discharge instructions. Pt reports understanding follow up with urology Monday. Pt ambulated to the bathroom then was wheeled from the ed. Pt IV removed. Family to drive home

## 2023-04-28 NOTE — Procedures (Signed)
Scrotal Abscess Incision & Drainage Note  Indications: 32 y.o. male with left scrotal abscess and left testicular mass  Pre-operative Diagnosis: left scrotal abscess  Post-operative Diagnosis: Same  Surgeon: Cathren Harsh, MD  Assistants: None  Procedure Details  Patient was placed in the supine position, prepped with Betadine and draped in the usual sterile fashion. Lidocaine was injected around the abscess base and given time to provide adequate anesthetic. The most fluctuant area was then incised with an 11 blade and 10-15cc of purulence expressed which was swabbed and sent for culture. The area was thoroughly irrigated and probed, then packed with a small gauze strip.                 Complications: None; patient tolerated the procedure well.  Plan:   1.   See consult note for details       Attending Attestation: Dr. Liliane Shi was available.

## 2023-04-28 NOTE — ED Triage Notes (Signed)
Pt reports  going to [facility] in [place] and was given antibiotic for pain in testicles. Pt reports being referred to a urologist. Pt woke up in severe pain this morning with a black spot on his skin on his testicles. Pt verbalizes severe discomfort.

## 2023-04-28 NOTE — ED Provider Notes (Signed)
[facility] EMERGENCY DEPARTMENT AT [facility] Provider Note   CSN: [id number] Arrival date & time: 04/28/23  0849     History  Chief Complaint  Patient presents with   Testicle Pain    Steve Stewart is a 32 y.o. male.  32 year old male with prior medical history as detailed below presents for evaluation.  Patient complains of worsening pain in his left scrotum.  Patient previously seen for same complaint at [facility] in [place].  Patient was diagnosed with possible left testicular mass.  Patient is reporting that he has follow-up scheduled with urology on Tuesday of this coming week in [place].  Patient reports increased pain to the left scrotum overnight.  He decided to come to the ED for repeat evaluation.  The history is provided by the patient and medical records.       Home Medications Prior to Admission medications   Medication Sig Start Date End Date Taking? Authorizing Provider  naproxen (NAPROSYN) 375 MG tablet Take 1 tablet (375 mg total) 2 (two) times daily by mouth. 07/03/17   Felicie Morn, NP      Allergies    Patient has no known allergies.    Review of Systems   Review of Systems  All other systems reviewed and are negative.   Physical Exam Updated Vital Signs BP (!) 132/94   Pulse 83   Temp (!) 97.5 F (36.4 C)   Resp 18   Ht 5\' 11"  (1.803 m)   Wt 120.2 kg   SpO2 100%   BMI 36.96 kg/m  Physical Exam Vitals and nursing note reviewed.  Constitutional:      General: He is not in acute distress.    Appearance: Normal appearance. He is well-developed.  HENT:     Head: Normocephalic and atraumatic.  Eyes:     Conjunctiva/sclera: Conjunctivae normal.     Pupils: Pupils are equal, round, and reactive to light.  Cardiovascular:     Rate and Rhythm: Normal rate and regular rhythm.     Heart sounds: Normal heart sounds.  Pulmonary:     Effort: Pulmonary effort is normal. No respiratory distress.     Breath sounds: Normal  breath sounds.  Abdominal:     General: There is no distension.     Palpations: Abdomen is soft.     Tenderness: There is no abdominal tenderness.  Genitourinary:    Comments: Enlargement of left scrotum noted on exam.  Softly fluctuant area with likely hematoma on the left scrotal wall.  See images below. Musculoskeletal:        General: No deformity. Normal range of motion.     Cervical back: Normal range of motion and neck supple.  Skin:    General: Skin is warm and dry.  Neurological:     General: No focal deficit present.     Mental Status: He is alert and oriented to person, place, and time.        ED Results / Procedures / Treatments   Labs (all labs ordered are listed, but only abnormal results are displayed) Labs Reviewed  CBC WITH DIFFERENTIAL/PLATELET - Abnormal; Notable for the following components:      Result Value   Platelets 104 (*)    All other components within normal limits  BASIC METABOLIC PANEL - Abnormal; Notable for the following components:   Calcium 8.6 (*)    All other components within normal limits  LACTATE DEHYDROGENASE - Abnormal; Notable for the following components:  LDH 199 (*)    All other components within normal limits  URINALYSIS, W/ REFLEX TO CULTURE (INFECTION SUSPECTED)  AFP TUMOR MARKER  BETA HCG QUANT (REF LAB)    EKG None  Radiology US SCROTUM W/DOPPLER  Result Date: 04/28/2023 CLINICAL DATA:  32 year old male with left side scrotal pain. EXAM: SCROTAL ULTRASOUND DOPPLER ULTRASOUND OF THE TESTICLES TECHNIQUE: Complete ultrasound examination of the testicles, epididymis, and other scrotal structures was performed. Color and spectral Doppler ultrasound were also utilized to evaluate blood flow to the testicles. COMPARISON:  None Available. FINDINGS: Right testicle Measurements: 4.8 x 3.1 x 2.8 cm. No mass or microlithiasis visualized. Left testicle Measurements: 6.4 x 4.8 x 4.6 cm. In addition to being asymmetrically enlarged the  left testis echogenicity is highly heterogeneous (series 1, image 23 and see cine series 2 images). There is maintained vascularity within the testis. Although not with the intense color Doppler hypervascularity typically seen with orchitis. Right epididymis:  Normal in size and appearance. Left epididymis:  Normal in size and appearance. Hydrocele:  None visualized. Varicocele:  None visualized. Pulsed Doppler interrogation of both testes demonstrates normal low resistance arterial and venous waveforms bilaterally. IMPRESSION: 1. Positive for abnormally enlarged and heterogeneous but vascular Left Testis highly suspicious for Testicular Neoplasm. Recommend Urology consultation. 2. Negative for testicular torsion.  Right testis appears normal. Electronically Signed   By: Odessa Fleming M.D.   On: 04/28/2023 10:25    Procedures Procedures    Medications Ordered in ED Medications  lidocaine (PF) (XYLOCAINE) 1 % injection 20 mL (has no administration in time range)  oxyCODONE (Oxy IR/ROXICODONE) immediate release tablet 5 mg (5 mg Oral Given 04/28/23 0934)    ED Course/ Medical Decision Making/ A&P                                 Medical Decision Making Amount and/or Complexity of Data Reviewed Labs: ordered. Radiology: ordered.  Risk Prescription drug management.    Medical Screen Complete  This patient presented to the ED with complaint of left-sided scrotal pain.  This complaint involves an extensive number of treatment options. The initial differential diagnosis includes, but is not limited to, scrotal abscess, torsion, testicular mass  This presentation is: Acute, Self-Limited, Previously Undiagnosed, Uncertain Prognosis, Complicated, Systemic Symptoms, and Threat to Life/Bodily Function  Patient with recently diagnosed likely testicular mass.  Patient presents with worsening pain and swelling to the left scrotum.  Exam is suggestive of possible abscess.  Urology aware of case.   Urology resident performed I&D in ED at bedside.  Patient is scheduled with [facility] for left radical orchiectomy on Monday morning.  Patient understands plan of care.  Importance of close follow-up stressed.  Strict return precautions given and understood.   Additional history obtained: External records from outside sources obtained and reviewed including prior ED visits and prior Inpatient records.    Lab Tests:  I ordered and personally interpreted labs.  The pertinent results include: CBC, BMP   Imaging Studies ordered:  I ordered imaging studies including testicular ultrasound I independently visualized and interpreted obtained imaging which showed no torsion I agree with the radiologist interpretation.   Problem List / ED Course:  Scrotal mass, scrotal abscess   Reevaluation:  After the interventions noted above, I reevaluated the patient and found that they have: improved   Disposition:  After consideration of the diagnostic results and the patients response to  treatment, I feel that the patent would benefit from close outpatient follow-up.          Final Clinical Impression(s) / ED Diagnoses Final diagnoses:  Pain in left testicle    Rx / DC Orders ED Discharge Orders     None         Wynetta Fines, MD 04/28/23 [phone]

## 2023-04-28 NOTE — Discharge Instructions (Addendum)
Return for any problem.   Return as instructed by urology.  You should not eat or drink anything after midnight on Sunday.  Patient present as instructed Monday morning for your scheduled surgical procedure with [facility].  Continue antibiotics as previously prescribed. Use pain medication as previously prescribed.

## 2023-04-30 ENCOUNTER — Other Ambulatory Visit: Payer: Self-pay | Admitting: Urology

## 2023-04-30 NOTE — Patient Instructions (Signed)
SURGICAL WAITING ROOM VISITATION  Patients having surgery or a procedure may have no more than 2 support people in the waiting area - these visitors may rotate.    Children under the age of 22 must have an adult with them who is not the patient.  Due to an increase in RSV and influenza rates and associated hospitalizations, children ages 58 and under may not visit patients in [facility] hospitals.  If the patient needs to stay at the hospital during part of their recovery, the visitor guidelines for inpatient rooms apply. Pre-op nurse will coordinate an appropriate time for 1 support person to accompany patient in pre-op.  This support person may not rotate.    Please refer to the [facility] website for the visitor guidelines for Inpatients (after your surgery is over and you are in a regular room).       Your procedure is scheduled on: 04/02/23   Report to [facility] Main Entrance    Report to admitting at  12 noon   Call this number if you have problems the morning of surgery [phone]   Do not eat food :After Midnight.   After Midnight you may have the following liquids until ___11:30___ AM DAY OF SURGERY  Water Non-Citrus Juices (without pulp, NO RED-Apple, White grape, White cranberry) Black Coffee (NO MILK/CREAM OR CREAMERS, sugar ok)  Clear Tea (NO MILK/CREAM OR CREAMERS, sugar ok) regular and decaf                             Plain Jell-O (NO RED)                                           Fruit ices (not with fruit pulp, NO RED)                                     Popsicles (NO RED)                                                               Sports drinks like Gatorade (NO RED)                  The day of surgery:  Drink ONE (1) Pre-Surgery Clear Ensure at 11:30 AM the morning of surgery. Drink in one sitting. Do not sip.  This drink was given to you during your hospital  pre-op appointment visit. Nothing else to drink after completing the   Pre-Surgery Clear Ensure     Oral Hygiene is also important to reduce your risk of infection.                                    Remember - BRUSH YOUR TEETH THE MORNING OF SURGERY WITH YOUR REGULAR TOOTHPASTE   Stop all vitamins and herbal supplements 7 days before surgery.   Take these medicines the morning of surgery : Tylenol if needed  You may not have any metal on your body including hair pins, jewelry, and body piercing             Do not wear make-up, lotions, powders, perfumes/cologne, or deodorant               Men may shave face and neck.   Do not bring valuables to the hospital. [facility] IS NOT             RESPONSIBLE   FOR VALUABLES.   Contacts, glasses, dentures or bridgework may not be worn into surgery.  DO NOT BRING YOUR HOME MEDICATIONS TO THE HOSPITAL. PHARMACY WILL DISPENSE MEDICATIONS LISTED ON YOUR MEDICATION LIST TO YOU DURING YOUR ADMISSION IN THE HOSPITAL!    Patients discharged on the day of surgery will not be allowed to drive home.  Someone NEEDS to stay with you for the first 24 hours after anesthesia.   Special Instructions: Bring a copy of your healthcare power of attorney and living will documents the day of surgery if you haven't scanned them before.              Please read over the following fact sheets you were given: IF YOU HAVE QUESTIONS ABOUT YOUR PRE-OP INSTRUCTIONS PLEASE CALL [phone] Steve Stewart   If you received a COVID test during your pre-op visit  it is requested that you wear a mask when out in public, stay away from anyone that may not be feeling well and notify your surgeon if you develop symptoms. If you test positive for Covid or have been in contact with anyone that has tested positive in the last 10 days please notify you surgeon.    [facility] - Preparing for Surgery Before surgery, you can play an important role.  Because skin is not sterile, your skin needs to be as free of germs as possible.  You can reduce  the number of germs on your skin by washing with CHG (chlorahexidine gluconate) soap before surgery.  CHG is an antiseptic cleaner which kills germs and bonds with the skin to continue killing germs even after washing. Please DO NOT use if you have an allergy to CHG or antibacterial soaps.  If your skin becomes reddened/irritated stop using the CHG and inform your nurse when you arrive at Short Stay. Do not shave (including legs and underarms) for at least 48 hours prior to the first CHG shower.  You may shave your face/neck.  Please follow these instructions carefully:  1.  Shower with CHG Soap the night before surgery and the  morning of surgery.  2.  If you choose to wash your hair, wash your hair first as usual with your normal  shampoo.  3.  After you shampoo, rinse your hair and body thoroughly to remove the shampoo.                             4.  Use CHG as you would any other liquid soap.  You can apply chg directly to the skin and wash.  Gently with a scrungie or clean washcloth.  5.  Apply the CHG Soap to your body ONLY FROM THE NECK DOWN.   Do   not use on face/ open                           Wound or open sores. Avoid contact with eyes,  ears mouth and   genitals (private parts).                       Wash face,  Genitals (private parts) with your normal soap.             6.  Wash thoroughly, paying special attention to the area where your    surgery  will be performed.  7.  Thoroughly rinse your body with warm water from the neck down.  8.  DO NOT shower/wash with your normal soap after using and rinsing off the CHG Soap.                9.  Pat yourself dry with a clean towel.            10.  Wear clean pajamas.            11.  Place clean sheets on your bed the night of your first shower and do not  sleep with pets. Day of Surgery : Do not apply any lotions/deodorants the morning of surgery.  Please wear clean clothes to the hospital/surgery center.  FAILURE TO FOLLOW THESE  INSTRUCTIONS MAY RESULT IN THE CANCELLATION OF YOUR SURGERY  PATIENT SIGNATURE_________________________________  NURSE SIGNATURE__________________________________  ________________________________________________________________________

## 2023-04-30 NOTE — Patient Instructions (Addendum)
SURGICAL WAITING ROOM VISITATION  Patients having surgery or a procedure may have no more than 2 support people in the waiting area - these visitors may rotate.    Children under the age of 7 must have an adult with them who is not the patient.  Due to an increase in RSV and influenza rates and associated hospitalizations, children ages 42 and under may not visit patients in [facility] hospitals.  If the patient needs to stay at the hospital during part of their recovery, the visitor guidelines for inpatient rooms apply. Pre-op nurse will coordinate an appropriate time for 1 support person to accompany patient in pre-op.  This support person may not rotate.    Please refer to the [facility] website for the visitor guidelines for Inpatients (after your surgery is over and you are in a regular room).       Your procedure is scheduled on: Wednesday, Sept 11, 2024   Report to [facility] Main Entrance    Report to admitting at 6:45 AM   Call this number if you have problems the morning of surgery [phone]   Do not eat food or drink :After Midnight.   FOLLOW BOWEL PREP AND ANY ADDITIONAL PRE OP INSTRUCTIONS YOU RECEIVED FROM YOUR SURGEON'S OFFICE!!!     Oral Hygiene is also important to reduce your risk of infection.                                    Remember - BRUSH YOUR TEETH THE MORNING OF SURGERY WITH YOUR REGULAR TOOTHPASTE  DENTURES WILL BE REMOVED PRIOR TO SURGERY PLEASE DO NOT APPLY "Poly grip" OR ADHESIVES!!!   Do NOT smoke after Midnight   Stop all vitamins and herbal supplements 7 days before surgery.   Take these medicines the morning of surgery with A SIP OF WATER: NONE                              You may not have any metal on your body including jewelry, and body piercing             Do not wear lotions, powders, perfumes/cologne, or deodorant              Men may shave face and neck.   Do not bring valuables to the hospital. [facility] IS NOT              RESPONSIBLE   FOR VALUABLES.   Contacts, glasses, dentures or bridgework may not be worn into surgery.   Bring small overnight bag day of surgery.   DO NOT BRING YOUR HOME MEDICATIONS TO THE HOSPITAL. PHARMACY WILL DISPENSE MEDICATIONS LISTED ON YOUR MEDICATION LIST TO YOU DURING YOUR ADMISSION IN THE HOSPITAL!    Patients discharged on the day of surgery will not be allowed to drive home.  Someone NEEDS to stay with you for the first 24 hours after anesthesia.   Special Instructions: Bring a copy of your healthcare power of attorney and living will documents the day of surgery if you haven't scanned them before.              Please read over the following fact sheets you were given: IF YOU HAVE QUESTIONS ABOUT YOUR PRE-OP INSTRUCTIONS PLEASE CALL [phone]   If you received a COVID test during your pre-op visit  it is requested that you wear a mask when out in public, stay away from anyone that may not be feeling well and notify your surgeon if you develop symptoms. If you test positive for Covid or have been in contact with anyone that has tested positive in the last 10 days please notify you surgeon.    [facility] - Preparing for Surgery Before surgery, you can play an important role.  Because skin is not sterile, your skin needs to be as free of germs as possible.  You can reduce the number of germs on your skin by washing with CHG (chlorahexidine gluconate) soap before surgery.  CHG is an antiseptic cleaner which kills germs and bonds with the skin to continue killing germs even after washing. Please DO NOT use if you have an allergy to CHG or antibacterial soaps.  If your skin becomes reddened/irritated stop using the CHG and inform your nurse when you arrive at Short Stay. Do not shave (including legs and underarms) for at least 48 hours prior to the first CHG shower.  You may shave your face/neck.  Please follow these instructions carefully:  1.  Shower with CHG Soap  the night before surgery and the  morning of surgery.  2.  If you choose to wash your hair, wash your hair first as usual with your normal  shampoo.  3.  After you shampoo, rinse your hair and body thoroughly to remove the shampoo.                             4.  Use CHG as you would any other liquid soap.  You can apply chg directly to the skin and wash.  Gently with a scrungie or clean washcloth.  5.  Apply the CHG Soap to your body ONLY FROM THE NECK DOWN.   Do   not use on face/ open                           Wound or open sores. Avoid contact with eyes, ears mouth and   genitals (private parts).                       Wash face,  Genitals (private parts) with your normal soap.             6.  Wash thoroughly, paying special attention to the area where your    surgery  will be performed.  7.  Thoroughly rinse your body with warm water from the neck down.  8.  DO NOT shower/wash with your normal soap after using and rinsing off the CHG Soap.                9.  Pat yourself dry with a clean towel.            10.  Wear clean pajamas.            11.  Place clean sheets on your bed the night of your first shower and do not  sleep with pets. Day of Surgery : Do not apply any lotions/deodorants the morning of surgery.  Please wear clean clothes to the hospital/surgery center.  FAILURE TO FOLLOW THESE INSTRUCTIONS MAY RESULT IN THE CANCELLATION OF YOUR SURGERY  PATIENT SIGNATURE_________________________________  NURSE SIGNATURE__________________________________  ________________________________________________________________________

## 2023-05-01 ENCOUNTER — Encounter (HOSPITAL_COMMUNITY): Payer: Self-pay

## 2023-05-01 ENCOUNTER — Encounter (HOSPITAL_COMMUNITY)
Admission: RE | Admit: 2023-05-01 | Discharge: 2023-05-01 | Disposition: A | Discharge status: Home / Self Care | Payer: Self-pay | Source: Ambulatory Visit | Attending: Urology | Admitting: Urology

## 2023-05-01 ENCOUNTER — Other Ambulatory Visit: Payer: Self-pay

## 2023-05-01 HISTORY — DX: Other specified health status: Z78.9

## 2023-05-01 LAB — AFP TUMOR MARKER: AFP, Serum, Tumor Marker: <1.8 ng/mL (ref 0.0–6.9)

## 2023-05-01 LAB — BETA HCG QUANT (REF LAB): hCG Quant: 2 m[IU]/mL (ref 0–3)

## 2023-05-01 NOTE — Progress Notes (Signed)
Second attempt to complete Medical history on phone . No answer LVM.

## 2023-05-01 NOTE — Anesthesia Preprocedure Evaluation (Signed)
Anesthesia Evaluation  Patient identified by MRN, date of birth, ID band Patient awake    Reviewed: Allergy & Precautions, NPO status , Patient's Chart, lab work & pertinent test results  Airway Mallampati: II  TM Distance: >3 FB Neck ROM: Full    Dental  (+) Chipped, Dental Advisory Given,    Pulmonary Current SmokerPatient did not abstain from smoking.   Pulmonary exam normal breath sounds clear to auscultation       Cardiovascular negative cardio ROS Normal cardiovascular exam Rhythm:Regular Rate:Normal     Neuro/Psych negative neurological ROS  negative psych ROS   GI/Hepatic negative GI ROS, Neg liver ROS,,,  Endo/Other  negative endocrine ROS    Renal/GU negative Renal ROS  negative genitourinary   Musculoskeletal negative musculoskeletal ROS (+)    Abdominal   Peds  Hematology negative hematology ROS (+)   Anesthesia Other Findings   Reproductive/Obstetrics                             Anesthesia Physical Anesthesia Plan  ASA: 2  Anesthesia Plan: General   Post-op Pain Management: Tylenol PO (pre-op)*   Induction: Intravenous  PONV Risk Score and Plan: 1 and Ondansetron, Dexamethasone and Midazolam  Airway Management Planned: LMA  Additional Equipment:   Intra-op Plan:   Post-operative Plan: Extubation in OR  Informed Consent: I have reviewed the patients History and Physical, chart, labs and discussed the procedure including the risks, benefits and alternatives for the proposed anesthesia with the patient or authorized representative who has indicated his/her understanding and acceptance.     Dental advisory given  Plan Discussed with: CRNA  Anesthesia Plan Comments:        Anesthesia Quick Evaluation

## 2023-05-01 NOTE — Progress Notes (Addendum)
PCP - no PCP per pt.  Cardiologist - no  PPM/ICD -  Device Orders -  Rep Notified -   Chest x-ray -  EKG -  Stress Test -  ECHO -  Cardiac Cath -   Sleep Study -  CPAP -   Fasting Blood Sugar -  Checks Blood Sugar _____ times a day  Blood Thinner Instructions: Aspirin Instructions:  ERAS Protcol - PRE-SURGERY Ensure or G2- n/a   COVID vaccine -no  Activity--Able to complete exercise and Climb a flight of stairs without CP or SOB Anesthesia review:   Patient denies shortness of breath, fever, cough and chest pain at PAT appointment   All instructions explained to the patient, with a verbal understanding of the material. Patient agrees to go over the instructions while at home for a better understanding. Patient also instructed to self quarantine after being tested for COVID-19. The opportunity to ask questions was provided.

## 2023-05-01 NOTE — Progress Notes (Addendum)
Please second sign orders for surgery 05-02-23

## 2023-05-01 NOTE — Progress Notes (Signed)
3rd attempt  to obtain med, history via phone call . No answer.

## 2023-05-02 ENCOUNTER — Ambulatory Visit (HOSPITAL_BASED_OUTPATIENT_CLINIC_OR_DEPARTMENT_OTHER): Payer: Medicaid Other | Admitting: Anesthesiology

## 2023-05-02 ENCOUNTER — Other Ambulatory Visit: Payer: Self-pay

## 2023-05-02 ENCOUNTER — Encounter (HOSPITAL_COMMUNITY)
Admission: RE | Disposition: A | Discharge status: Home / Self Care | Payer: Self-pay | Source: Home / Self Care | Attending: Urology

## 2023-05-02 ENCOUNTER — Ambulatory Visit (HOSPITAL_COMMUNITY): Payer: Medicaid Other | Admitting: Anesthesiology

## 2023-05-02 ENCOUNTER — Ambulatory Visit (HOSPITAL_COMMUNITY)
Admission: RE | Admit: 2023-05-02 | Discharge: 2023-05-02 | Disposition: A | Discharge status: Home / Self Care | Payer: Medicaid Other | Attending: Urology | Admitting: Urology

## 2023-05-02 ENCOUNTER — Encounter (HOSPITAL_COMMUNITY): Payer: Self-pay | Admitting: Urology

## 2023-05-02 DIAGNOSIS — R590 Localized enlarged lymph nodes: Secondary | ICD-10-CM | POA: Insufficient documentation

## 2023-05-02 DIAGNOSIS — N509 Disorder of male genital organs, unspecified: Secondary | ICD-10-CM

## 2023-05-02 DIAGNOSIS — C6292 Malignant neoplasm of left testis, unspecified whether descended or undescended: Secondary | ICD-10-CM | POA: Diagnosis not present

## 2023-05-02 DIAGNOSIS — F17210 Nicotine dependence, cigarettes, uncomplicated: Secondary | ICD-10-CM | POA: Insufficient documentation

## 2023-05-02 DIAGNOSIS — N5089 Other specified disorders of the male genital organs: Secondary | ICD-10-CM | POA: Diagnosis present

## 2023-05-02 HISTORY — PX: ORCHIECTOMY: SHX2116

## 2023-05-02 SURGERY — ORCHIECTOMY
Anesthesia: General | Laterality: Left

## 2023-05-02 MED ORDER — DEXAMETHASONE SODIUM PHOSPHATE 10 MG/ML IJ SOLN
INTRAMUSCULAR | Status: DC | PRN
  Administered 2023-05-02: 10 mg via INTRAVENOUS

## 2023-05-02 MED ORDER — FENTANYL CITRATE (PF) 100 MCG/2ML IJ SOLN
INTRAMUSCULAR | Status: AC
  Filled 2023-05-02: qty 2

## 2023-05-02 MED ORDER — PROPOFOL 10 MG/ML IV BOLUS
INTRAVENOUS | Status: AC
  Filled 2023-05-02: qty 20

## 2023-05-02 MED ORDER — ONDANSETRON HCL 4 MG/2ML IJ SOLN
INTRAMUSCULAR | Status: DC | PRN
  Administered 2023-05-02: 4 mg via INTRAVENOUS

## 2023-05-02 MED ORDER — LACTATED RINGERS IV SOLN: INTRAVENOUS | Status: DC | PRN

## 2023-05-02 MED ORDER — PROPOFOL 10 MG/ML IV BOLUS
INTRAVENOUS | Status: DC | PRN
  Administered 2023-05-02: 200 mg via INTRAVENOUS

## 2023-05-02 MED ORDER — CEFAZOLIN IN SODIUM CHLORIDE 3-0.9 GM/100ML-% IV SOLN
2.0000 g | INTRAVENOUS | Status: AC
  Administered 2023-05-02: 3 g via INTRAVENOUS
  Filled 2023-05-02: qty 100

## 2023-05-02 MED ORDER — FENTANYL CITRATE PF 50 MCG/ML IJ SOSY
25.0000 ug | PREFILLED_SYRINGE | INTRAMUSCULAR | Status: DC | PRN
  Administered 2023-05-02: 50 ug via INTRAVENOUS

## 2023-05-02 MED ORDER — DEXAMETHASONE SODIUM PHOSPHATE 10 MG/ML IJ SOLN
INTRAMUSCULAR | Status: AC
  Filled 2023-05-02: qty 1

## 2023-05-02 MED ORDER — OXYCODONE-ACETAMINOPHEN 5-325 MG PO TABS
1.0000 | ORAL_TABLET | ORAL | 0 refills | Status: DC | PRN
Start: 2023-05-02 — End: 2023-06-25

## 2023-05-02 MED ORDER — FENTANYL CITRATE PF 50 MCG/ML IJ SOSY
PREFILLED_SYRINGE | INTRAMUSCULAR | Status: AC
  Filled 2023-05-02: qty 1

## 2023-05-02 MED ORDER — LACTATED RINGERS IV SOLN: INTRAVENOUS | Status: DC

## 2023-05-02 MED ORDER — MIDAZOLAM HCL 2 MG/2ML IJ SOLN
INTRAMUSCULAR | Status: AC
  Filled 2023-05-02: qty 2

## 2023-05-02 MED ORDER — HYDROMORPHONE HCL 2 MG/ML IJ SOLN
INTRAMUSCULAR | Status: AC
  Filled 2023-05-02: qty 1

## 2023-05-02 MED ORDER — MIDAZOLAM HCL 5 MG/5ML IJ SOLN
INTRAMUSCULAR | Status: DC | PRN
  Administered 2023-05-02: 2 mg via INTRAVENOUS

## 2023-05-02 MED ORDER — ACETAMINOPHEN 500 MG PO TABS
1000.0000 mg | ORAL_TABLET | Freq: Once | ORAL | Status: AC
  Administered 2023-05-02: 1000 mg via ORAL
  Filled 2023-05-02: qty 2

## 2023-05-02 MED ORDER — LIDOCAINE HCL (PF) 2 % IJ SOLN
INTRAMUSCULAR | Status: AC
  Filled 2023-05-02: qty 5

## 2023-05-02 MED ORDER — ONDANSETRON HCL 4 MG/2ML IJ SOLN
INTRAMUSCULAR | Status: AC
  Filled 2023-05-02: qty 2

## 2023-05-02 MED ORDER — CHLORHEXIDINE GLUCONATE 0.12 % MT SOLN
15.0000 mL | Freq: Once | OROMUCOSAL | Status: AC
  Administered 2023-05-02: 15 mL via OROMUCOSAL

## 2023-05-02 MED ORDER — ORAL CARE MOUTH RINSE: 15.0000 mL | Freq: Once | OROMUCOSAL | Status: AC

## 2023-05-02 MED ORDER — FENTANYL CITRATE (PF) 100 MCG/2ML IJ SOLN
INTRAMUSCULAR | Status: DC | PRN
  Administered 2023-05-02 (×4): 50 ug via INTRAVENOUS

## 2023-05-02 MED ORDER — 0.9 % SODIUM CHLORIDE (POUR BTL) OPTIME
TOPICAL | Status: DC | PRN
Start: 2023-05-02 — End: 2023-05-02
  Administered 2023-05-02: 1000 mL

## 2023-05-02 MED ORDER — LIDOCAINE HCL (CARDIAC) PF 100 MG/5ML IV SOSY
PREFILLED_SYRINGE | INTRAVENOUS | Status: DC | PRN
  Administered 2023-05-02: 100 mg via INTRAVENOUS

## 2023-05-02 MED ORDER — BUPIVACAINE-EPINEPHRINE (PF) 0.5% -1:200000 IJ SOLN
INTRAMUSCULAR | Status: DC | PRN
  Administered 2023-05-02: 10 mL

## 2023-05-02 MED ORDER — HYDROMORPHONE HCL 1 MG/ML IJ SOLN
INTRAMUSCULAR | Status: DC | PRN
Start: 2023-05-02 — End: 2023-05-02
  Administered 2023-05-02: .5 mg via INTRAVENOUS

## 2023-05-02 MED ORDER — BUPIVACAINE-EPINEPHRINE (PF) 0.5% -1:200000 IJ SOLN
INTRAMUSCULAR | Status: AC
  Filled 2023-05-02: qty 30

## 2023-05-02 SURGICAL SUPPLY — 33 items
ADH SKN CLS APL DERMABOND .7 (GAUZE/BANDAGES/DRESSINGS) ×1
BAG COUNTER SPONGE SURGICOUNT (BAG) IMPLANT
BAG SPNG CNTER NS LX DISP (BAG)
BLADE HEX COATED 2.75 (ELECTRODE) ×1 IMPLANT
BNDG GAUZE DERMACEA FLUFF 4 (GAUZE/BANDAGES/DRESSINGS) ×1 IMPLANT
BNDG GZE DERMACEA 4 6PLY (GAUZE/BANDAGES/DRESSINGS) ×1
COVER SURGICAL LIGHT HANDLE (MISCELLANEOUS) ×1 IMPLANT
DERMABOND ADVANCED .7 DNX12 (GAUZE/BANDAGES/DRESSINGS) IMPLANT
DISSECTOR ROUND CHERRY 3/8 STR (MISCELLANEOUS) ×1 IMPLANT
DRAIN PENROSE 0.5X18 (DRAIN) ×1 IMPLANT
DRAPE LAPAROTOMY T 98X78 PEDS (DRAPES) ×1 IMPLANT
ELECT NDL TIP 2.8 STRL (NEEDLE) ×1 IMPLANT
ELECT NEEDLE TIP 2.8 STRL (NEEDLE) ×1
ELECT REM PT RETURN 15FT ADLT (MISCELLANEOUS) ×1 IMPLANT
GLOVE SURG LX STRL 7.5 STRW (GLOVE) ×1 IMPLANT
GOWN STRL REUS W/ TWL LRG LVL3 (GOWN DISPOSABLE) ×1 IMPLANT
GOWN STRL REUS W/TWL LRG LVL3 (GOWN DISPOSABLE) ×1
KIT BASIN OR (CUSTOM PROCEDURE TRAY) ×1 IMPLANT
KIT TURNOVER KIT A (KITS) IMPLANT
NDL HYPO 22X1.5 SAFETY MO (MISCELLANEOUS) IMPLANT
NEEDLE HYPO 22X1.5 SAFETY MO (MISCELLANEOUS)
NS IRRIG 1000ML POUR BTL (IV SOLUTION) ×1 IMPLANT
PACK GENERAL/GYN (CUSTOM PROCEDURE TRAY) ×1 IMPLANT
SUPPORTER AHLETIC TETRA LG (SOFTGOODS) ×1 IMPLANT
SUT CHROMIC 2 0 SH (SUTURE) IMPLANT
SUT MNCRL AB 4-0 PS2 18 (SUTURE) ×1 IMPLANT
SUT SILK 0 SH 30 (SUTURE) ×1 IMPLANT
SUT VIC AB 2-0 SH 27 (SUTURE) ×2
SUT VIC AB 2-0 SH 27X BRD (SUTURE) IMPLANT
SUT VICRYL 0 UR6 27IN ABS (SUTURE) IMPLANT
SYR CONTROL 10ML LL (SYRINGE) IMPLANT
TOWEL OR 17X26 10 PK STRL BLUE (TOWEL DISPOSABLE) ×2 IMPLANT
WATER STERILE IRR 1000ML POUR (IV SOLUTION) ×1 IMPLANT

## 2023-05-02 NOTE — H&P (Signed)
Urology Preoperative H&P   Chief Complaint: Left testicular mass  History of Present Illness: Steve Stewart is a 32 y.o. male with a large left testicular mass that has been present for 2 months with heterogenous echogeneity with features concerning for testis cancer.  He underwent an I&D of a concurrent left inguinal/lateral scrotal abscess on 04/28/23.  LDH was slightly elevated, but AFP and hCG were WNL. CT from 9/7 showed axillary, retroperitoneal and inguinal lymphadenopathy.  He is here today for left inguinal orchiectomy.    Past Medical History:  Diagnosis Date   Medical history non-contributory    Past Surgical History:  Procedure Laterality Date   NO PAST SURGERIES      Allergies: No Known Allergies  History reviewed. No pertinent family history.  Social History:  reports that he has been smoking cigarettes. He has never used smokeless tobacco. He reports that he does not drink alcohol and does not use drugs.  ROS: A complete review of systems was performed.  All systems are negative except for pertinent findings as noted.  Physical Exam:  Vital signs in last 24 hours: Temp:  [98.6 F (37 C)] 98.6 F (37 C) (09/11 0659) Pulse Rate:  [72] 72 (09/11 0659) Resp:  [20] 20 (09/11 0659) BP: (120)/(73) 120/73 (09/11 0659) SpO2:  [97 %] 97 % (09/11 0659) Weight:  [120 kg-120.2 kg] 120 kg (09/11 0735) Constitutional:  Alert and oriented, No acute distress Cardiovascular: Regular rate and rhythm, No JVD Respiratory: Normal respiratory effort, Lungs clear bilaterally GI: Abdomen is soft, nontender, nondistended, no abdominal masses GU: No CVA tenderness Lymphatic: No lymphadenopathy Neurologic: Grossly intact, no focal deficits Psychiatric: Normal mood and affect  Laboratory Data:  No results for input(s): "WBC", "HGB", "HCT", "PLT" in the last 72 hours.  No results for input(s): "NA", "K", "CL", "GLUCOSE", "BUN", "CALCIUM", "CREATININE" in the last 72 hours.  Invalid  input(s): "CO3"   No results found for this or any previous visit (from the past 24 hour(s)). No results found for this or any previous visit (from the past 240 hour(s)).  Renal Function: Recent Labs    04/28/23 0941  CREATININE 0.97   Estimated Creatinine Clearance: 141.5 mL/min (by C-G formula based on SCr of 0.97 mg/dL).  Radiologic Imaging: CLINICAL DATA:  32 year old male with left side scrotal pain.   EXAM: SCROTAL ULTRASOUND   DOPPLER ULTRASOUND OF THE TESTICLES   TECHNIQUE: Complete ultrasound examination of the testicles, epididymis, and other scrotal structures was performed. Color and spectral Doppler ultrasound were also utilized to evaluate blood flow to the testicles.   COMPARISON:  None Available.   FINDINGS: Right testicle   Measurements: 4.8 x 3.1 x 2.8 cm. No mass or microlithiasis visualized.   Left testicle   Measurements: 6.4 x 4.8 x 4.6 cm. In addition to being asymmetrically enlarged the left testis echogenicity is highly heterogeneous (series 1, image 23 and see cine series 2 images). There is maintained vascularity within the testis. Although not with the intense color Doppler hypervascularity typically seen with orchitis.   Right epididymis:  Normal in size and appearance.   Left epididymis:  Normal in size and appearance.   Hydrocele:  None visualized.   Varicocele:  None visualized.   Pulsed Doppler interrogation of both testes demonstrates normal low resistance arterial and venous waveforms bilaterally.   IMPRESSION: 1. Positive for abnormally enlarged and heterogeneous but vascular Left Testis highly suspicious for Testicular Neoplasm. Recommend Urology consultation. 2. Negative for testicular torsion.  Right  testis appears normal.     Electronically Signed   By: Odessa Fleming M.D.   On: 04/28/2023 10:25        I independently reviewed the above imaging studies.  Assessment and Plan [name] is a 32 y.o. male with a  heterogenous left testicular mass with features concerning for testis cancer   The risks, benefits and alternatives of left inguinal orchiectomy was discussed with the patient.  Risks include, but are not limited to, bleeding complications, infections, nerve damage, hernia formation and the inherent risks of general anesthesia.  He voices understanding and wishes to proceed.   Rhoderick Moody, MD 05/02/2023, 7:45 AM  [facility] Pager: [phone]

## 2023-05-02 NOTE — Op Note (Signed)
Operative Note  Preoperative diagnosis:  1.  Left testicular mass  Postoperative diagnosis: 1.  Left testicular mass  Procedure(s): 1.  Left inguinal radical orchiectomy  Surgeon: Rhoderick Moody, MD  Assistants:  None  Anesthesia:  General  Complications:  None  EBL: 20 mL  Specimens: 1.  Left testicle and spermatic cord  Drains/Catheters: 1.  None  Intraoperative findings:   Grossly negative margins following excision of left testicle and spermatic cord  Indication:  Steve Stewart is a 32 y.o. 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  Tumor markers revealed an elevated LDH.  Staging CT chest/abdomen/pelvis revealed widespread lymphadenopathy in the axilla, retroperitoneum and inguinal regions.  He has been consented for the above procedures, voices understanding and wishes to proceed.  Description of procedure:  After informed consent was obtained, the patient was brought to the operating room and general endotracheal anesthesia was administered.  The patient was placed in the supine position and prepped and draped in usual sterile fashion.  A timeout was then performed.  A 10 cm left inguinal incision was made.  The overlying subcutaneous tissue was incised using electrocautery.  Once the external inguinal ring and external oblique aponeurosis was exposed, the external oblique aponeurosis was incised sharply using Metzenbaum scissors.  The left ilioinguinal nerve was identified and preserved.  The spermatic cord was then circumferentially isolated and a Penrose was placed around it for a tourniquet.  The left testicle was then expressed from the left hemiscrotum, incising the gubernacular attachments with electrocautery.  The left spermatic cord was then dissected as proximally as possible and bisected with Kelly clamps.  The left spermatic cord was then sharply incised distal to the [facility] clamps and sent off  for permanent section.  The left spermatic cord stumps were then suture-ligated with 0 silk suture (with tags).  Inspection of the left hemiscrotum revealed no evidence of persistent infection or evidence of bleeding.  The left inguinal canal was hemostatic.  The external oblique aponeurosis was then closed with a running 2-0 Vicryl suture.  The skin was then closed in 2 layers and dressed with Dermabond.  He tolerated the procedure well and was transferred to the postanesthesia in stable condition.  Plan: Discharge home. Medical oncology referral pending as on OP.

## 2023-05-02 NOTE — Transfer of Care (Signed)
Immediate Anesthesia Transfer of Care Note  Patient: Jawanza Volesky  Procedure(s) Performed: LEFT INGUINAL ORCHIECTOMY (Left)  Patient Location: PACU  Anesthesia Type:General  Level of Consciousness: drowsy  Airway & Oxygen Therapy: Patient Spontanous Breathing and Patient connected to face mask oxygen  Post-op Assessment: Report given to RN and Post -op Vital signs reviewed and stable  Post vital signs: Reviewed and stable  Last Vitals:  Vitals Value Taken Time  BP 121/79 05/02/23 1020  Temp    Pulse 80 05/02/23 1021  Resp 20 05/02/23 1021  SpO2 98 % 05/02/23 1021  Vitals shown include unfiled device data.  Last Pain:  Vitals:   05/02/23 0735  TempSrc:   PainSc: 0-No pain         Complications: No notable events documented.

## 2023-05-03 ENCOUNTER — Encounter (HOSPITAL_COMMUNITY): Payer: Self-pay | Admitting: Urology

## 2023-05-03 NOTE — Anesthesia Postprocedure Evaluation (Signed)
Anesthesia Post Note  Patient: Steve Stewart  Procedure(s) Performed: LEFT INGUINAL ORCHIECTOMY (Left)     Patient location during evaluation: PACU Anesthesia Type: General Level of consciousness: awake and alert Pain management: pain level controlled Vital Signs Assessment: post-procedure vital signs reviewed and stable Respiratory status: spontaneous breathing, nonlabored ventilation, respiratory function stable and patient connected to nasal cannula oxygen Cardiovascular status: blood pressure returned to baseline and stable Postop Assessment: no apparent nausea or vomiting Anesthetic complications: no  No notable events documented.  Last Vitals:  Vitals:   05/02/23 1055 05/02/23 1129  BP: 120/85 117/81  Pulse: 74 68  Resp: 14 14  Temp: 36.4 C 36.5 C  SpO2: 99% 98%    Last Pain:  Vitals:   05/02/23 1129  TempSrc: Oral  PainSc: 3    Pain Goal: Patients Stated Pain Goal: 3 (05/02/23 1055)                 Steve Stewart

## 2023-05-04 ENCOUNTER — Other Ambulatory Visit: Payer: Self-pay

## 2023-05-04 DIAGNOSIS — C6292 Malignant neoplasm of left testis, unspecified whether descended or undescended: Secondary | ICD-10-CM

## 2023-05-04 NOTE — Progress Notes (Signed)
Labs ordered to be done a day before visit.

## 2023-05-07 LAB — SURGICAL PATHOLOGY

## 2023-05-10 ENCOUNTER — Inpatient Hospital Stay: Payer: Self-pay

## 2023-05-10 DIAGNOSIS — E278 Other specified disorders of adrenal gland: Secondary | ICD-10-CM | POA: Insufficient documentation

## 2023-05-10 DIAGNOSIS — F17210 Nicotine dependence, cigarettes, uncomplicated: Secondary | ICD-10-CM | POA: Insufficient documentation

## 2023-05-10 DIAGNOSIS — C6292 Malignant neoplasm of left testis, unspecified whether descended or undescended: Secondary | ICD-10-CM | POA: Insufficient documentation

## 2023-05-10 LAB — CBC WITH DIFFERENTIAL (CANCER CENTER ONLY)
Abs Immature Granulocytes: 0.02 10*3/uL (ref 0.00–0.07)
Basophils Absolute: 0 10*3/uL (ref 0.0–0.1)
Basophils Relative: 0 %
Eosinophils Absolute: 0.2 10*3/uL (ref 0.0–0.5)
Eosinophils Relative: 3 %
HCT: 43.2 % (ref 39.0–52.0)
Hemoglobin: 14.8 g/dL (ref 13.0–17.0)
Immature Granulocytes: 0 %
Lymphocytes Relative: 39 %
Lymphs Abs: 2.3 10*3/uL (ref 0.7–4.0)
MCH: 29.1 pg (ref 26.0–34.0)
MCHC: 34.3 g/dL (ref 30.0–36.0)
MCV: 85 fL (ref 80.0–100.0)
Monocytes Absolute: 0.5 10*3/uL (ref 0.1–1.0)
Monocytes Relative: 8 %
Neutro Abs: 2.9 10*3/uL (ref 1.7–7.7)
Neutrophils Relative %: 50 %
Platelet Count: 114 10*3/uL — ABNORMAL LOW (ref 150–400)
RBC: 5.08 MIL/uL (ref 4.22–5.81)
RDW: 14 % (ref 11.5–15.5)
Smear Review: NORMAL
WBC Count: 5.9 10*3/uL (ref 4.0–10.5)
nRBC: 0 % (ref 0.0–0.2)

## 2023-05-10 LAB — LACTATE DEHYDROGENASE: LDH: 129 U/L (ref 98–192)

## 2023-05-10 LAB — CMP (CANCER CENTER ONLY)
ALT: 21 U/L (ref 0–44)
AST: 14 U/L — ABNORMAL LOW (ref 15–41)
Albumin: 4.1 g/dL (ref 3.5–5.0)
Alkaline Phosphatase: 59 U/L (ref 38–126)
Anion gap: 6 (ref 5–15)
BUN: 19 mg/dL (ref 6–20)
CO2: 27 mmol/L (ref 22–32)
Calcium: 9.6 mg/dL (ref 8.9–10.3)
Chloride: 105 mmol/L (ref 98–111)
Creatinine: 0.99 mg/dL (ref 0.61–1.24)
GFR, Estimated: >60 mL/min (ref >60)
Glucose, Bld: 95 mg/dL (ref 70–99)
Potassium: 4.2 mmol/L (ref 3.5–5.1)
Sodium: 138 mmol/L (ref 135–145)
Total Bilirubin: 0.4 mg/dL (ref 0.3–1.2)
Total Protein: 7.3 g/dL (ref 6.5–8.1)

## 2023-05-10 NOTE — Assessment & Plan Note (Addendum)
pT2 cN1M1 normal TM. Pending evaluation of right adrenal mass. 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 Recommend additional evaluation. If no visceral metastasis, then would be stage III, good risk. Treatment will be BEP x 3. Chemotherapy teaching for BEP Plan for BEP q21 days

## 2023-05-10 NOTE — Assessment & Plan Note (Addendum)
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 Recommend further evaluation. MRI adrenal protocol and biopsy if imaging cannot determine.

## 2023-05-10 NOTE — Progress Notes (Unsigned)
[facility] CONSULT NOTE  Patient Care Team: Patient, [name] - General (General Practice)  ASSESSMENT & PLAN:  Steve Stewart is a 32 y.o.male with unremarkable medical history being seen [facility] for seminoma.  We discussed his pathology and imaging findings today.  His imaging showed multiple stations of lymph node abnormalities mediastinum, right hilar, bilateral axillary, retroperitoneal bilateral iliac chains, bilateral inguinal lymph areas.  There is an incidental right-sided 1.2 cm adrenal nodule.  The pattern of metastasis is, especially for metastasis to the adrenal gland.  We cannot rule out this could be adrenal adenoma which is benign.  Together he does have M1 disease and pT2 cN1M1 with normal TM.  I recommend MRI with adrenal protocol for further evaluation and treatment.  If this is adenoma, he will be staged as M1a, stage IIIA Good risk.  Guideline is chemotherapy with BEP for 3 cycles (9 weeks) or EP for 4 cycles (12 weeks)  He has no history of lung disease.  He is a smoker.  Today we talked about cigarette cessation, he said he will think about it.  We discussed the importance of stopping cigarette smoking to prevent further damage with bleomycin.  He will return to see me in a few weeks after sperm banking, MRI, port placement and teaching prior to starting treatment.   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 Pending evaluation of right adrenal mass. 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 Recommend additional evaluation. If no visceral metastasis, then would be stage III, good risk. Treatment will be BEP x 3. Chemotherapy teaching for BEP Plan for BEP q21 days  Right adrenal mass (HCC) Seminoma to the adrenal gland is very rare. Recommend further evaluation. MRI adrenal protocol and biopsy if imaging cannot determine.  Supportive Sperm banking. Refer to [facility] PFT Refer for mediport  placement  Patient education included in AVS Your pathology showed seminoma from the testicle. The initial treatment is surgery and that has been completed. Per imaging and available date, you have stage IIIA with good risk factors.  The adrenal gland is too small and not safe for biopsy.  We should obtain a MRI and also continue follow-up with imaging in the future.  I have ordered MRI scan to be completed in the next few weeks.   The standard of care as of today is 3 cycles of BEP chemotherapy. We will schedule for chemotherapy teaching.  5-year survival without testicular cancer recurrence is about 90%.  Without treatment, cancer will continue to spread to other areas of the body and patient will succumb to the disease.  Side effects may include decreased blood count, infection, sepsis, need for transfusion, bleeding, hemorrhage, pulmonary toxicity including pulmonary fibrosis, cough, renal toxicity, electrolyte abnormalities, kidney failure, infertility, clotting, neuropathy, mouth sores, hearing loss, and rarely second cancer such as leukemia.  Long-term risks of cardiovascular disease and pulmonary disease may occur. You will need labs prior to chemotherapy and visits prior to each cycle.  If there is signs of residual cancer after chemotherapy, then you may need additional surgery. We will reassess at that time.  You will need schedule for mediport placement, pulmonary function test, fertility consultation. You should get calls from different department on scheduling the appointment  Orders Placed This Encounter  Procedures   MR Abdomen W Wo Contrast    Standing Status:   Future    Standing Expiration Date:   05/09/2024  Order Specific Question:   If indicated for the ordered procedure, I authorize the administration of contrast media per Radiology protocol    Answer:   Yes    Order Specific Question:   What is the patient's sedation requirement?    Answer:   No Sedation    Order  Specific Question:   Does the patient have a pacemaker or implanted devices?    Answer:   No    Order Specific Question:   Preferred imaging location?    Answer:   [facility] (table limit - 550 lbs)   IR IMAGING GUIDED PORT INSERTION    Standing Status:   Future    Standing Expiration Date:   05/10/2024    Order Specific Question:   Reason for Exam (SYMPTOM  OR DIAGNOSIS REQUIRED)    Answer:   mediport for chemotherapy    Order Specific Question:   Preferred Imaging Location?    Answer:   [facility]   CBC with Differential (Cancer Center Only)    Standing Status:   Future    Standing Expiration Date:   06/24/2024   CMP (Cancer Center only)    Standing Status:   Future    Standing Expiration Date:   06/24/2024   Magnesium    Standing Status:   Future    Standing Expiration Date:   06/24/2024   AFP tumor marker    Standing Status:   Future    Standing Expiration Date:   06/24/2024   Beta HCG, Quant (tumor marker)    Standing Status:   Future    Standing Expiration Date:   06/24/2024   Lactate dehydrogenase    Standing Status:   Future    Standing Expiration Date:   06/24/2024   Pulmonary Function Test    Standing Status:   Future    Standing Expiration Date:   05/09/2024    Order Specific Question:   Where should this test be performed?    Answer:   [facility]    Order Specific Question:   Full PFT: includes the following: basic spirometry, spirometry pre & post bronchodilator, diffusion capacity (DLCO), lung volumes    Answer:   Full PFT   All questions were answered. The patient knows to call the clinic with any problems, questions or concerns. No barriers to learning was detected.  [name], MD 9/23/20242:23 PM  CHIEF COMPLAINTS/PURPOSE OF CONSULTATION:  Testicular cancer  HISTORY OF PRESENTING ILLNESS:  Steve Stewart 32 y.o. male is here because of testicular cancer.  I have reviewed his chart and materials related to his cancer extensively and  collaborated history with the patient. Summary of oncologic history is as follows:  He has been smoking about 1/2 pack for day since about 14. He denies short of breath of cough.  Oncology History  Primary seminoma of left testis (HCC)  04/28/2023 Initial Diagnosis   Primary seminoma of left testis [facility]) Presented to ED for worsening testicular pain and presented with 2 months history of testicular mass.   04/28/2023 Imaging   [place] 1. Positive for abnormally enlarged and heterogeneous but vascular Left Testis highly suspicious for Testicular Neoplasm. Recommend Urology consultation. 2. Negative for testicular torsion.  Right testis appears normal.   04/28/2023 Imaging   CT CAP 1. Partially visualized thickening of the skin of the left scrotum. 2. Prominent bilateral axillary lymph nodes, right greater than left. Suspect at least 2 lymph nodes with abnormal cortical thickening in the  right axilla. 3. Shotty retroperitoneal, bilateral iliac chain lymph nodes measuring up to 1.1 cm in short axis. 4. Bilateral inguinal lymph nodes with borderline thickened left inguinal lymph nodes measuring up to 1.1 cm in short axis. 5. 1.2 cm right adrenal mass, indeterminate. Attention on follow-up is recommended. Alternatively evaluation with MRI may be considered. 6. Hazy attenuation within the anterior mediastinum without defined mass may represent residual thymus. Attention on follow-up is recommended. 7. Cholelithiasis. 8. L4-L5 bilateral pars articularis defect. 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 10. Further evaluation with axillary and inguinal ultrasound may be considered.   05/02/2023 Surgery   TESTICLE, LEFT, ORCHIECTOMY:  Seminoma, size 6.1 cm  Tumor limited to testis with rete testis and lymphovascular invasion  (pT2)  Germ cell neoplasia in situ is present  Spermatic cord and all margins of resection are negative for tumor     06/25/2023 -  Chemotherapy    Patient is on Treatment Plan : TESTICULAR BEP q21d x 3 Cycles       MEDICAL HISTORY:  Past Medical History:  Diagnosis Date   Medical history non-contributory     SURGICAL HISTORY: Past Surgical History:  Procedure Laterality Date   NO PAST SURGERIES     ORCHIECTOMY Left 05/02/2023   Procedure: LEFT INGUINAL ORCHIECTOMY;  Surgeon: Rene Paci, MD;  Location: [facility];  Service: Urology;  Laterality: Left;    SOCIAL HISTORY: Social History   Socioeconomic History   Marital status: Single    Spouse name: Not on file   Number of children: Not on file   Years of education: Not on file   Highest education level: Not on file  Occupational History   Not on file  Tobacco Use   Smoking status: Every Day    Current packs/day: 0.50    Types: Cigarettes   Smokeless tobacco: Never   Tobacco comments:    Under a half of a pack a day  Vaping Use   Vaping status: Never Used  Substance and Sexual Activity   Alcohol use: No   Drug use: No   Sexual activity: Yes  Other Topics Concern   Not on file  Social History Narrative   Not on file   Social Determinants of Health   Financial Resource Strain: Not on file  Food Insecurity: No Food Insecurity (05/11/2023)   Hunger Vital Sign    Worried About Running Out of Food in the Last Year: Never true    Ran Out of Food in the Last Year: Never true  Transportation Needs: No Transportation Needs (05/11/2023)   PRAPARE - Administrator, Civil Service (Medical): No    Lack of Transportation (Non-Medical): No  Physical Activity: Not on file  Stress: Not on file  Social Connections: Unknown (01/02/2022)   Received [facility]   Social Network    Social Network: Not on file  Intimate Partner Violence: Not At Risk (04/24/2023)   Received from [facility]   HITS    Over the last 12 months how often did your partner physically hurt you?: 1    Over the last 12 months how often did your partner insult you or  talk down to you?: 1    Over the last 12 months how often did your partner threaten you with physical harm?: 1    Over the last 12 months how often did your partner scream or curse at you?: 1  FAMILY HISTORY: No family history on file.  ALLERGIES:  has No Known Allergies.  MEDICATIONS:  Current Outpatient Medications  Medication Sig Dispense Refill   acetaminophen (TYLENOL) 650 MG CR tablet Take 1,300 mg by mouth every 8 (eight) hours as needed for pain.     albuterol (VENTOLIN HFA) 108 (90 Base) MCG/ACT inhaler Inhale 1 puff into the lungs every 6 (six) hours as needed for wheezing or shortness of breath.     ciprofloxacin (CIPRO) 500 MG tablet Take 500 mg by mouth 2 (two) times daily. 7 day prescription     oxyCODONE-acetaminophen (PERCOCET) 5-325 MG tablet Take 1 tablet by mouth every 4 (four) hours as needed for severe pain. 15 tablet 0   [START ON 06/24/2023] dexamethasone (DECADRON) 4 MG tablet Take 2 tablets (8 mg total) by mouth daily. Start the day after last cisplatin dose on day 6 of chemo x 3 days.Take with food. 30 tablet 1   [START ON 06/24/2023] lidocaine-prilocaine (EMLA) cream Apply to affected area once 30 g 3   [START ON 06/24/2023] ondansetron (ZOFRAN) 8 MG tablet Take 1 tablet (8 mg total) by mouth every 8 (eight) hours as needed for nausea or vomiting. Start on the third day after last cisplatin dose on day 8 of chemo 30 tablet 1   [START ON 06/24/2023] prochlorperazine (COMPAZINE) 10 MG tablet Take 1 tablet (10 mg total) by mouth every 6 (six) hours as needed for nausea or vomiting. 30 tablet 1   No current facility-administered medications for this visit.    REVIEW OF SYSTEMS:   Constitutional: Denies fevers, weight loss or abnormal night sweats Eyes: Denies blurriness of vision, double vision Mouth: Denies mucositis or sore throat Respiratory: Denies cough, shortness of breath or wheezes Cardiovascular: Denies chest discomfort  Gastrointestinal:  Denies nausea,  vomiting, abdominal pain GU: Denies any difficulty. Healing wound Lymphatics: Denies new lymphadenopathy  Neurological: Denies headaches or new weaknesses All other systems were reviewed with the patient and are negative.  PHYSICAL EXAMINATION: ECOG PERFORMANCE STATUS: 0 - Asymptomatic  Vitals:   05/11/23 1240  BP: 111/80  Pulse: 84  Resp: 20  Temp: (!) 97.2 F (36.2 C)  SpO2: 99%   Filed Weights   05/11/23 1240  Weight: 252 lb 11.2 oz (114.6 kg)    GENERAL: alert, no distress and comfortable SKIN: skin color is normal, no jaundice, rashes or significant lesions EYES: sclera clear OROPHARYNX: no exudate, no erythema NECK: supple LYMPH:  no palpable lymphadenopathy in the cervical, axillary regions LUNGS: Effort normal, no respiratory distress.  Clear to auscultation bilaterally HEART: regular rate & rhythm and no lower extremity edema ABDOMEN: soft, non-tender and nondistended NEURO: no focal motor/sensory deficits.  Alternating hand movements equal  LABORATORY DATA:  I have reviewed the data as listed Lab Results  Component Value Date   WBC 5.9 05/10/2023   HGB 14.8 05/10/2023   HCT 43.2 05/10/2023   MCV 85.0 05/10/2023   PLT 114 (L) 05/10/2023   Recent Labs    04/28/23 0941 05/10/23 0834  NA 135 138  K 4.3 4.2  CL 105 105  CO2 22 27  GLUCOSE 98 95  BUN 14 19  CREATININE 0.97 0.99  CALCIUM 8.6* 9.6  GFRNONAA >60 >60  PROT  --  7.3  ALBUMIN  --  4.1  AST  --  14*  ALT  --  21  ALKPHOS  --  59  BILITOT  --  0.4    RADIOGRAPHIC STUDIES: I  have personally reviewed the radiological images as listed and agreed with the findings in the report. CT CHEST ABDOMEN PELVIS W CONTRAST  Result Date: 04/28/2023 CLINICAL DATA:  Suspected testicular neoplasm. Evaluation for metastatic disease. EXAM: CT CHEST, ABDOMEN, AND PELVIS WITH CONTRAST TECHNIQUE: Multidetector CT imaging of the chest, abdomen and pelvis was performed following the standard protocol during  bolus administration of intravenous contrast. RADIATION DOSE REDUCTION: This exam was performed according to the departmental dose-optimization program which includes automated exposure control, adjustment of the mA and/or kV according to patient size and/or use of iterative reconstruction technique. CONTRAST:  75mL OMNIPAQUE IOHEXOL 350 MG/ML SOLN COMPARISON:  Testicular ultrasound April 28, 2023. FINDINGS: CT CHEST FINDINGS Cardiovascular: No significant vascular findings. Normal heart size. No pericardial effusion. Mediastinum/Nodes: Multiple shotty mediastinal lymph nodes. Borderline enlarged right hilar lymph node measures 1.3 cm in short axis. Prominent bilateral axillary lymph nodes, right greater than left. Suspect at least 2 lymph nodes with cortical thickening in the right axilla. Hazy density in the anterior mediastinum may represent residual thymus. Lungs/Pleura: Lungs are clear. No pleural effusion or pneumothorax. Musculoskeletal: No chest wall mass or suspicious bone lesions identified. CT ABDOMEN PELVIS FINDINGS Hepatobiliary: Normal appearance of the liver. Gallstone within the apex of the gallbladder. Pancreas: Unremarkable. No pancreatic ductal dilatation or surrounding inflammatory changes. Spleen: Normal in size without focal abnormality. Adrenals/Urinary Tract: 1.2 cm right adrenal mass. The left adrenal is normal. Normal kidneys. Normal urinary bladder. Stomach/Bowel: Stomach is within normal limits. Appendix appears normal. No evidence of bowel wall thickening, distention, or inflammatory changes. Vascular/Lymphatic: Shotty retroperitoneal bilateral iliac chain lymph nodes measuring up to 1.1 cm in short axis. Bilateral inguinal lymph nodes with borderline thickened left inguinal lymph nodes measuring up to 1.1 cm in short axis. Reproductive: Prostate is unremarkable. Partially visualized thickening of the skin of the left scrotum. Other: No abdominal wall hernia or abnormality. No  abdominopelvic ascites. 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 IMPRESSION: 1. Partially visualized thickening of the skin of the left scrotum. 2. Prominent bilateral axillary lymph nodes, right greater than left. Suspect at least 2 lymph nodes with abnormal cortical thickening in the right axilla. 3. Shotty retroperitoneal, bilateral iliac chain lymph nodes measuring up to 1.1 cm in short axis. 4. Bilateral inguinal lymph nodes with borderline thickened left inguinal lymph nodes measuring up to 1.1 cm in short axis. 5. 1.2 cm right adrenal mass, indeterminate. Attention on follow-up is recommended. Alternatively evaluation with MRI may be considered. 6. Hazy attenuation within the anterior mediastinum without defined mass may represent residual thymus. Attention on follow-up is recommended. 7. Cholelithiasis. 8. L4-L5 bilateral pars articularis defect. 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 10. Further evaluation with axillary and inguinal ultrasound may be considered. Electronically Signed   By: Ted Mcalpine M.D.   On: 04/28/2023 15:05   US SCROTUM W/DOPPLER  Result Date: 04/28/2023 CLINICAL DATA:  32 year old male with left side scrotal pain. EXAM: SCROTAL ULTRASOUND DOPPLER ULTRASOUND OF THE TESTICLES TECHNIQUE: Complete ultrasound examination of the testicles, epididymis, and other scrotal structures was performed. Color and spectral Doppler ultrasound were also utilized to evaluate blood flow to the testicles. COMPARISON:  None Available. FINDINGS: Right testicle Measurements: 4.8 x 3.1 x 2.8 cm. No mass or microlithiasis visualized. Left testicle Measurements: 6.4 x 4.8 x 4.6 cm. In addition to being asymmetrically enlarged the left testis echogenicity is highly heterogeneous (series  1, image 23 and see cine series 2 images). There is maintained vascularity within the testis. Although not with the  intense color Doppler hypervascularity typically seen with orchitis. Right epididymis:  Normal in size and appearance. Left epididymis:  Normal in size and appearance. Hydrocele:  None visualized. Varicocele:  None visualized. Pulsed Doppler interrogation of both testes demonstrates normal low resistance arterial and venous waveforms bilaterally. IMPRESSION: 1. Positive for abnormally enlarged and heterogeneous but vascular Left Testis highly suspicious for Testicular Neoplasm. Recommend Urology consultation. 2. Negative for testicular torsion.  Right testis appears normal. Electronically Signed   By: Odessa Fleming M.D.   On: 04/28/2023 10:25

## 2023-05-11 ENCOUNTER — Other Ambulatory Visit (HOSPITAL_COMMUNITY): Payer: Self-pay

## 2023-05-11 ENCOUNTER — Inpatient Hospital Stay (HOSPITAL_BASED_OUTPATIENT_CLINIC_OR_DEPARTMENT_OTHER): Payer: Medicaid Other

## 2023-05-11 VITALS — BP 111/80 | HR 84 | Temp 97.2°F | Resp 20 | Wt 252.7 lb

## 2023-05-11 DIAGNOSIS — E278 Other specified disorders of adrenal gland: Secondary | ICD-10-CM

## 2023-05-11 DIAGNOSIS — F17210 Nicotine dependence, cigarettes, uncomplicated: Secondary | ICD-10-CM

## 2023-05-11 DIAGNOSIS — C6292 Malignant neoplasm of left testis, unspecified whether descended or undescended: Secondary | ICD-10-CM | POA: Diagnosis not present

## 2023-05-11 LAB — BETA HCG QUANT (REF LAB): hCG Quant: <1 m[IU]/mL (ref 0–3)

## 2023-05-11 MED ORDER — PROCHLORPERAZINE MALEATE 10 MG PO TABS
10.0000 mg | ORAL_TABLET | Freq: Four times a day (QID) | ORAL | 1 refills | Status: AC | PRN
  Filled 2023-05-11: qty 30, 8d supply, fill #0
  Filled 2023-07-11: qty 30, 8d supply, fill #1

## 2023-05-11 MED ORDER — DEXAMETHASONE 4 MG PO TABS
8.0000 mg | ORAL_TABLET | Freq: Every day | ORAL | 1 refills | Status: AC
  Filled 2023-05-11: qty 30, 15d supply, fill #0
  Filled 2023-07-09: qty 30, 15d supply, fill #1

## 2023-05-11 MED ORDER — ONDANSETRON HCL 8 MG PO TABS
8.0000 mg | ORAL_TABLET | Freq: Three times a day (TID) | ORAL | 1 refills | Status: DC | PRN
  Filled 2023-05-11: qty 30, 10d supply, fill #0

## 2023-05-11 MED ORDER — LIDOCAINE-PRILOCAINE 2.5-2.5 % EX CREA
TOPICAL_CREAM | CUTANEOUS | 3 refills | Status: DC
Start: 2023-06-24 — End: 2023-10-30
  Filled 2023-05-11: qty 30, 30d supply, fill #0
  Filled 2023-07-09: qty 30, 1d supply, fill #1

## 2023-05-11 NOTE — Progress Notes (Signed)
START ON PATHWAY REGIMEN - Testicular     A cycle is every 21 days:     Bleomycin      Etoposide      Cisplatin   **Always confirm dose/schedule in your pharmacy ordering system**  Patient Characteristics: Seminoma, Newly Diagnosed, Stage III, Good Risk AJCC T Category: pT2 AJCC N Category: cN1 AJCC M Category: cM1a AJCC S Category Post-Orchiectomy: S0 AJCC 8 Stage Grouping: IIIA Histology: Seminoma Risk Status: Good Risk Intent of Therapy: Curative Intent, Discussed with Patient

## 2023-05-11 NOTE — Patient Instructions (Signed)
Patient education included in AVS Your pathology showed seminoma from the testicle. The initial treatment is surgery and that has been completed. Per imaging and available date, you have stage IIIA with good risk factors.  The adrenal gland is too small and not safe for biopsy.  We should obtain a MRI and also continue follow-up with imaging in the future.  I have ordered MRI scan to be completed in the next few weeks.   The standard of care as of today is 3 cycles of BEP chemotherapy. We will schedule for chemotherapy teaching.  5-year survival without testicular cancer recurrence is about 90%.  Without treatment, cancer will continue to spread to other areas of the body and patient will succumb to the disease.  Side effects may include decreased blood count, infection, sepsis, need for transfusion, bleeding, hemorrhage, pulmonary toxicity including pulmonary fibrosis, cough, renal toxicity, electrolyte abnormalities, kidney failure, infertility, clotting, neuropathy, mouth sores, hearing loss, and rarely second cancer such as leukemia.  Long-term risks of cardiovascular disease and pulmonary disease may occur. You will need labs prior to chemotherapy and visits prior to each cycle.  If there is signs of residual cancer after chemotherapy, then you may need additional surgery. We will reassess at that time.  You will need schedule for mediport placement, pulmonary function test, fertility consultation. You should get calls from different department on scheduling the appointment

## 2023-05-12 ENCOUNTER — Other Ambulatory Visit: Payer: Self-pay

## 2023-05-12 LAB — AFP TUMOR MARKER: AFP, Serum, Tumor Marker: <1.8 ng/mL (ref 0.0–6.9)

## 2023-05-14 ENCOUNTER — Other Ambulatory Visit (HOSPITAL_COMMUNITY): Payer: Self-pay

## 2023-05-14 ENCOUNTER — Other Ambulatory Visit: Payer: Self-pay

## 2023-05-15 ENCOUNTER — Telehealth: Payer: Self-pay

## 2023-05-15 ENCOUNTER — Other Ambulatory Visit (HOSPITAL_COMMUNITY): Payer: Self-pay

## 2023-05-15 ENCOUNTER — Other Ambulatory Visit: Payer: Self-pay

## 2023-05-15 MED ORDER — NICOTINE 7 MG/24HR TD PT24
7.0000 mg | MEDICATED_PATCH | Freq: Every day | TRANSDERMAL | 0 refills | Status: AC
  Filled 2023-05-15 (×2): qty 28, 28d supply, fill #0

## 2023-05-15 MED ORDER — NICOTINE 14 MG/24HR TD PT24
14.0000 mg | MEDICATED_PATCH | Freq: Every day | TRANSDERMAL | 0 refills | Status: AC
  Filled 2023-05-15 (×2): qty 14, 14d supply, fill #0

## 2023-05-15 NOTE — Telephone Encounter (Signed)
TC from pt, requesting medication for smoking cessation which Dr. Cherly Hensen discussed at his last OV. Consulted w/ Dr. Algernon Huxley order given and submitted to [facility]. Also discussed fertility consult w/ pt as Dr. Cherly Hensen ordered yesterday. Received verbal consent to fax referral information to [facility]. Advised pt that someone should be calling him within the next couple of days to set up that appt. He verbalizes understanding of the above information. Demographics and last OV note faxed to [facility] at [phone].

## 2023-05-16 ENCOUNTER — Other Ambulatory Visit (HOSPITAL_COMMUNITY): Payer: Self-pay

## 2023-05-18 ENCOUNTER — Encounter: Payer: Self-pay | Admitting: *Deleted

## 2023-05-18 ENCOUNTER — Other Ambulatory Visit: Payer: Self-pay

## 2023-06-05 ENCOUNTER — Ambulatory Visit (HOSPITAL_COMMUNITY): Payer: Managed Care, Other (non HMO)

## 2023-06-15 ENCOUNTER — Other Ambulatory Visit: Payer: Self-pay | Admitting: Radiology

## 2023-06-15 NOTE — Progress Notes (Unsigned)
Pharmacist Chemotherapy Monitoring - Initial Assessment    Anticipated start date: 06/24/20    The following has been reviewed per standard work regarding the patient's treatment regimen: The patient's diagnosis, treatment plan and drug doses, and organ/hematologic function Lab orders and baseline tests specific to treatment regimen  The treatment plan start date, drug sequencing, and pre-medications Prior authorization status  Patient's documented medication list, including drug-drug interaction screen and prescriptions for anti-emetics and supportive care specific to the treatment regimen The drug concentrations, fluid compatibility, administration routes, and timing of the medications to be used The patient's access for treatment and lifetime cumulative dose history, if applicable  The patient's medication allergies and previous infusion related reactions, if applicable   Changes made to treatment plan:  N/A  Follow up needed:  Pending pulmonary function test   Stephens Shire, [facility], 06/15/2023  3:43 PM

## 2023-06-15 NOTE — Consult Note (Signed)
Chief Complaint: Patient was seen in consultation today for primary seminoma of left testis at the request of Dr. Lilia Pro.  Supervising Physician: Irish Lack  Patient Status: [facility] - Out-pt  History of Present Illness: Steve Stewart is a 32 y.o. male with a medical history of smoking.  A CT scan on 04/28/23 revealed lymphadenopathy in various locations and  a right 1.2 cn adrenal mass.. On 9/7 he also had an I and D of a left inguinal / lateral scrotal abscess, and a left inguinal orchiectomy for malignant neoplasm. Dr Lilia Pro requested consideration for a port-a-catheter placed in IR.Marland Kitchen   Past Medical History:  Diagnosis Date   Medical history non-contributory     Past Surgical History:  Procedure Laterality Date   NO PAST SURGERIES     ORCHIECTOMY Left 05/02/2023   Procedure: LEFT INGUINAL ORCHIECTOMY;  Surgeon: Rene Paci, MD;  Location: [facility] ORS;  Service: Urology;  Laterality: Left;    Allergies: Patient has no known allergies.  Medications: Prior to Admission medications   Medication Sig Start Date End Date Taking? Authorizing Provider  acetaminophen (TYLENOL) 650 MG CR tablet Take 1,300 mg by mouth every 8 (eight) hours as needed for pain.    [provider]  albuterol (VENTOLIN HFA) 108 (90 Base) MCG/ACT inhaler Inhale 1 puff into the lungs every 6 (six) hours as needed for wheezing or shortness of breath. 10/02/22   [provider]  ciprofloxacin (CIPRO) 500 MG tablet Take 500 mg by mouth 2 (two) times daily. 7 day prescription 04/24/23   [provider]  dexamethasone (DECADRON) 4 MG tablet Take 2 tablets (8 mg total) by mouth daily. Start the day after last cisplatin dose on day 6 of chemo x 3 days.Take with food. 06/24/23   [name], MD  lidocaine-prilocaine (EMLA) cream Apply to affected area once 06/24/23   [name], MD  nicotine (NICODERM CQ - DOSED IN MG/24 HR) 7 mg/24hr patch Place 1 patch (7 mg total) onto the  skin daily for 28 days. 05/30/23 06/27/23  [name], MD  ondansetron (ZOFRAN) 8 MG tablet Take 1 tablet (8 mg total) by mouth every 8 (eight) hours as needed for nausea or vomiting. Start on the third day after last cisplatin dose on day 8 of chemo 06/24/23   [name], MD  oxyCODONE-acetaminophen (PERCOCET) 5-325 MG tablet Take 1 tablet by mouth every 4 (four) hours as needed for severe pain. 05/02/23   Rene Paci, MD  prochlorperazine (COMPAZINE) 10 MG tablet Take 1 tablet (10 mg total) by mouth every 6 (six) hours as needed for nausea or vomiting. 06/24/23   [name], MD     No family history on file.  Social History   Socioeconomic History   Marital status: Single    Spouse name: Not on file   Number of children: Not on file   Years of education: Not on file   Highest education level: Not on file  Occupational History   Not on file  Tobacco Use   Smoking status: Every Day    Current packs/day: 0.50    Types: Cigarettes   Smokeless tobacco: Never   Tobacco comments:    Under a half of a pack a day  Vaping Use   Vaping status: Never Used  Substance and Sexual Activity   Alcohol use: No   Drug use: No   Sexual activity: Yes  Other Topics Concern  Not on file  Social History Narrative   Not on file   Social Determinants of Health   Financial Resource Strain: Not on file  Food Insecurity: No Food Insecurity (05/11/2023)   Hunger Vital Sign    Worried About Running Out of Food in the Last Year: Never true    Ran Out of Food in the Last Year: Never true  Transportation Needs: No Transportation Needs (05/11/2023)   PRAPARE - Administrator, Civil Service (Medical): No    Lack of Transportation (Non-Medical): No  Physical Activity: Not on file  Stress: Not on file  Social Connections: Unknown (01/02/2022)   Received from [facility]   Social Network    Social Network: Not on file    Review of Systems: A 12 point ROS discussed  and pertinent positives are indicated in the HPI above.  All other systems are negative.  Review of Systems  Constitutional:  Negative for activity change, chills and fatigue.  Respiratory:  Negative for chest tightness and shortness of breath.   Cardiovascular:  Negative for chest pain.  Neurological:  Negative for speech difficulty.  Psychiatric/Behavioral:  Negative for behavioral problems and confusion.     Vital Signs: There were no vitals taken for this visit.    Physical Exam Constitutional:      Appearance: Normal appearance.  HENT:     Mouth/Throat:     Mouth: Mucous membranes are moist.  Cardiovascular:     Rate and Rhythm: Normal rate and regular rhythm.     Pulses: Normal pulses.     Heart sounds: Normal heart sounds.  Pulmonary:     Effort: Pulmonary effort is normal.     Breath sounds: Normal breath sounds. No wheezing.  Abdominal:     General: Bowel sounds are normal.     Palpations: Abdomen is soft.     Tenderness: There is no abdominal tenderness.  Musculoskeletal:     Right lower leg: No edema.     Left lower leg: No edema.     Comments: Patient states he has left leg pain  Skin:    General: Skin is warm and dry.  Neurological:     Mental Status: He is alert and oriented to person, place, and time.  Psychiatric:        Mood and Affect: Mood normal.        Behavior: Behavior normal.      Labs:  CBC: Recent Labs    04/28/23 0941 05/10/23 0834  WBC 5.9 5.9  HGB 14.7 14.8  HCT 43.3 43.2  PLT 104* 114*      BMP: Recent Labs    04/28/23 0941 05/10/23 0834  NA 135 138  K 4.3 4.2  CL 105 105  CO2 22 27  GLUCOSE 98 95  BUN 14 19  CALCIUM 8.6* 9.6  CREATININE 0.97 0.99  GFRNONAA >60 >60    LIVER FUNCTION TESTS: Recent Labs    05/10/23 0834  BILITOT 0.4  AST 14*  ALT 21  ALKPHOS 59  PROT 7.3  ALBUMIN 4.1   .Marland Kitchen Vitals:   06/18/23 1230  BP: 123/78  Pulse: 75  Resp: 16  Temp: 98.7 F (37.1 C)  SpO2: 98%      Assessment and Plan:  Steve Stewart is a 32 y.o. male with a medical history of smoking.  A CT scan on 04/28/23 revealed lymphadenopathy in various locations and  right 1.2 cn adrenal mass.. On 9/7 he also had  an I and D of a left inguinal / lateral scrotal abscess, and a left inguinal orchiectomy for primary seminoma of left testis.  Dr Lilia Pro requested consideration for a port-a-catheter placed in IR. Procedure reviewed and approved by Dr. Fredia Sorrow. The patient has been NPO since midnight, in no apparent distress and afebrile.   Risks and benefits of image guided port-a-catheter placement was discussed with the patient including, but not limited to bleeding, infection, pneumothorax, or fibrin sheath development and need for additional procedures.  All of the patient's questions were answered, patient is agreeable to proceed. Consent signed and in chart.  Thank you for this interesting consult.  I greatly enjoyed meeting Steve Stewart and look forward to participating in their care.  A copy of this report was sent to the requesting provider on this date.  Electronically Signed: Ardith Dark, NP 06/15/2023, 12:57 PM   I spent a total of  30 Minutes   in face to face in clinical consultation, greater than 50% of which was counseling/coordinating care for port-a-catheter placement.

## 2023-06-18 ENCOUNTER — Other Ambulatory Visit: Payer: Self-pay

## 2023-06-18 ENCOUNTER — Ambulatory Visit (HOSPITAL_COMMUNITY)
Admission: RE | Admit: 2023-06-18 | Discharge: 2023-06-18 | Disposition: A | Discharge status: Home / Self Care | Payer: Medicaid Other | Source: Ambulatory Visit

## 2023-06-18 ENCOUNTER — Encounter (HOSPITAL_COMMUNITY): Payer: Self-pay

## 2023-06-18 DIAGNOSIS — C6290 Malignant neoplasm of unspecified testis, unspecified whether descended or undescended: Secondary | ICD-10-CM | POA: Diagnosis present

## 2023-06-18 DIAGNOSIS — C6292 Malignant neoplasm of left testis, unspecified whether descended or undescended: Secondary | ICD-10-CM

## 2023-06-18 DIAGNOSIS — F17210 Nicotine dependence, cigarettes, uncomplicated: Secondary | ICD-10-CM | POA: Insufficient documentation

## 2023-06-18 HISTORY — PX: IR IMAGING GUIDED PORT INSERTION: IMG5740

## 2023-06-18 MED ORDER — LIDOCAINE HCL 1 % IJ SOLN
20.0000 mL | Freq: Once | INTRAMUSCULAR | Status: AC
  Administered 2023-06-18: 20 mL via INTRADERMAL

## 2023-06-18 MED ORDER — SODIUM CHLORIDE 0.9% FLUSH: 3.0000 mL | Freq: Two times a day (BID) | INTRAVENOUS | Status: DC

## 2023-06-18 MED ORDER — DIPHENHYDRAMINE HCL 50 MG/ML IJ SOLN
INTRAMUSCULAR | Status: AC | PRN
Start: 2023-06-18 — End: 2023-06-18
  Administered 2023-06-18: 25 mg via INTRAVENOUS

## 2023-06-18 MED ORDER — HEPARIN SOD (PORK) LOCK FLUSH 100 UNIT/ML IV SOLN
INTRAVENOUS | Status: AC
  Filled 2023-06-18: qty 5

## 2023-06-18 MED ORDER — LIDOCAINE HCL 1 % IJ SOLN
INTRAMUSCULAR | Status: AC
  Filled 2023-06-18: qty 20

## 2023-06-18 MED ORDER — MIDAZOLAM HCL 2 MG/2ML IJ SOLN
INTRAMUSCULAR | Status: AC
  Filled 2023-06-18: qty 4

## 2023-06-18 MED ORDER — FENTANYL CITRATE (PF) 100 MCG/2ML IJ SOLN
INTRAMUSCULAR | Status: AC
  Filled 2023-06-18: qty 2

## 2023-06-18 MED ORDER — MIDAZOLAM HCL 2 MG/2ML IJ SOLN
INTRAMUSCULAR | Status: AC | PRN
Start: 2023-06-18 — End: 2023-06-18
  Administered 2023-06-18 (×2): 2 mg via INTRAVENOUS

## 2023-06-18 MED ORDER — HEPARIN SOD (PORK) LOCK FLUSH 100 UNIT/ML IV SOLN
500.0000 [IU] | Freq: Once | INTRAVENOUS | Status: AC
  Administered 2023-06-18: 500 [IU]

## 2023-06-18 MED ORDER — FENTANYL CITRATE (PF) 100 MCG/2ML IJ SOLN
INTRAMUSCULAR | Status: AC | PRN
  Administered 2023-06-18 (×2): 50 ug via INTRAVENOUS

## 2023-06-18 MED ORDER — SODIUM CHLORIDE 0.9 % IV SOLN: INTRAVENOUS | Status: DC

## 2023-06-18 MED ORDER — DIPHENHYDRAMINE HCL 50 MG/ML IJ SOLN
INTRAMUSCULAR | Status: AC
  Filled 2023-06-18: qty 1

## 2023-06-18 NOTE — Procedures (Signed)
Interventional Radiology Procedure Note  Procedure: Single Lumen Power Port Placement    Access:  Right IJ vein.  Findings: Catheter tip positioned at SVC/RA junction. Port is ready for immediate use.   Complications: None  EBL: < 10 mL  Recommendations:  - Ok to shower in 24 hours - Do not submerge for 7 days - Routine line care   Lashauna Arpin T. Jacobs Golab, M.D Pager:  [phone]   

## 2023-06-18 NOTE — Discharge Instructions (Signed)
Implanted Port Insertion, Care After  The following information offers guidance on how to care for yourself after your procedure. Your health care provider may also give you more specific instructions. If you have problems or questions, contact your health care provider.  What can I expect after the procedure? After the procedure, it is common to have: Discomfort at the port insertion site. Bruising on the skin over the port. This should improve over 3-4 days.   Urgent needs - Interventional Radiology, clinic [phone] (mon-fri 8-5).   Wound - May remove dressing and shower in 24 to 48 hours.  Keep site clean and dry.  Replace with bandaid as needed.  Do not submerge in tub or water until site healing well. If closed with glue, glue will flake off on its own.   If ordered by your provider, may start Emla cream (or any other creams ointments or lotions) in 2 weeks or after incision is healed. Port is ready for use immediately.   After completion of treatment, your provider should have you set up for monthly port flushes.   Follow these instructions at home: Port care After your port is placed, you will get a manufacturer's information card. The card has information about your port. Keep this card with you at all times. Take care of the port as told by your health care provider. Ask your health care provider if you or a family member can get training for taking care of the port at home. A home health care nurse will be be available to help care for the port. Make sure to remember what type of port you have. Incision care     Follow instructions from your health care provider about how to take care of your port insertion site. Make sure you: Wash your hands with soap and water for at least 20 seconds before and after you change your bandage (dressing). If soap and water are not available, use hand sanitizer. Change your dressing as told by your health care provider. Leave stitches  (sutures), skin glue, or adhesive strips in place. These skin closures may need to stay in place for 2 weeks or longer. If adhesive strip edges start to loosen and curl up, you may trim the loose edges. Do not remove adhesive strips completely unless your health care provider tells you to do that. Check your port insertion site every day for signs of infection. Check for: Redness, swelling, or pain. Fluid or blood. Warmth. Pus or a bad smell. Activity Return to your normal activities as told by your health care provider. Ask your health care provider what activities are safe for you. You may have to avoid lifting. Ask your health care provider how much you can safely lift. General instructions Take over-the-counter and prescription medicines only as told by your health care provider. Do not take baths, swim, or use a hot tub until your health care provider approves. Ask your health care provider if you may take showers. You may only be allowed to take sponge baths. If you were given a sedative during the procedure, it can affect you for several hours. Do not drive or operate machinery until your health care provider says that it is safe. Wear a medical alert bracelet in case of an emergency. This will tell any health care providers that you have a port. Keep all follow-up visits. This is important. Contact a health care provider if: You cannot flush your port with saline as directed, or you cannot   draw blood from the port. You have a fever or chills. You have redness, swelling, or pain around your port insertion site. You have fluid or blood coming from your port insertion site. Your port insertion site feels warm to the touch. You have pus or a bad smell coming from the port insertion site. Get help right away if: You have chest pain or shortness of breath. You have bleeding from your port that you cannot control. These symptoms may be an emergency. Get help right away. Call 911. Do not  wait to see if the symptoms will go away. Do not drive yourself to the hospital. Summary Take care of the port as told by your health care provider. Keep the manufacturer's information card with you at all times. Change your dressing as told by your health care provider. Contact a health care provider if you have a fever or chills or if you have redness, swelling, or pain around your port insertion site. Keep all follow-up visits. This information is not intended to replace advice given to you by your health care provider. Make sure you discuss any questions you have with your health care provider. Document Revised: 02/08/2021 Document Reviewed: 02/08/2021 Elsevier Patient Education  2023 [employer]    Moderate Conscious Sedation  Adult  Care After (English)  After the procedure, it is common to have: Sleepiness for a few hours. Impaired judgment for a few hours. Trouble with balance. Nausea or vomiting if you eat too soon. Follow these instructions at home: For the time period you were told by your health care provider:  Rest. Do not participate in activities where you could fall or become injured. Do not drive or use machinery. Do not drink alcohol. Do not take sleeping pills or medicines that cause drowsiness. Do not make important decisions or sign legal documents. Do not take care of children on your own. Eating and drinking Follow instructions from your health care provider about what you may eat and drink. Drink enough fluid to keep your urine pale yellow. If you vomit: Drink clear fluids slowly and in small amounts as you are able. Clear fluids include water, ice chips, low-calorie sports drinks, and fruit juice that has water added to it (diluted fruit juice). Eat light and bland foods in small amounts as you are able. These foods include bananas, applesauce, rice, lean meats, toast, and crackers. General instructions Take over-the-counter and prescription medicines  only as told by your health care provider. Have a responsible adult stay with you for the time you are told. Do not use any products that contain nicotine or tobacco. These products include cigarettes, chewing tobacco, and vaping devices, such as e-cigarettes. If you need help quitting, ask your health care provider. Return to your normal activities as told by your health care provider. Ask your health care provider what activities are safe for you. Your health care provider may give you more instructions. Make sure you know what you can and cannot do. Contact a health care provider if: You are still sleepy or having trouble with balance after 24 hours. You feel light-headed. You vomit every time you eat or drink. You get a rash. You have a fever. You have redness or swelling around the IV site. Get help right away if: You have trouble breathing. You start to feel confused at home. These symptoms may be an emergency. Get help right away. Call 911. Do not wait to see if the symptoms will go away. Do not   drive yourself to the hospital. This information is not intended to replace advice given to you by your health care provider. Make sure you discuss any questions you have with your health care provider. 

## 2023-06-19 ENCOUNTER — Telehealth: Payer: Self-pay

## 2023-06-19 ENCOUNTER — Other Ambulatory Visit: Payer: Self-pay

## 2023-06-19 ENCOUNTER — Encounter: Payer: Self-pay | Admitting: *Deleted

## 2023-06-19 DIAGNOSIS — C6292 Malignant neoplasm of left testis, unspecified whether descended or undescended: Secondary | ICD-10-CM

## 2023-06-19 NOTE — Progress Notes (Unsigned)
Patient Care Team: Patient, [name] - General (General Practice)  Clinic Day:  06/21/2023  Referring physician: [name], MD  ASSESSMENT & PLAN:  Addendum Ultrasound reported acute DVT in the left lower extremity.  Apixaban starter pack sent to [facility].  Will start at 10 mg twice daily for 1 week, followed by 5 mg twice daily.  Nurse Myriam Jacobson communicated with patient.  Plan to postpone start of chemotherapy treatment for 2 weeks.  Assessment & Plan:  Araceli is a 32 y.o.male with unremarkable medical history being [facility]oma.  We discussed his pathology and imaging findings.  His imaging showed multiple stations of lymph node abnormalities mediastinum, right hilar, bilateral axillary, retroperitoneal bilateral iliac chains, bilateral inguinal lymph areas.  There is an incidental right-sided 1.2 cm adrenal nodule.  The adrenal lesion could be adrenal adenoma which is benign vs malignant.  Together he does have M1 disease and pT2 cN1M1 with normal TM.  I recommend MRI with adrenal protocol for further evaluation and treatment.  If this is adenoma, he will be staged as M1a, stage IIIA Good risk.  Guideline is chemotherapy with BEP for 3 cycles (9 weeks) or EP for 4 cycles (12 weeks)   He has no history of lung disease.  He is a smoker.  Today we talked about cigarette cessation, he said he will think about it.  We discussed the importance of stopping cigarette smoking to prevent further damage with bleomycin.  He will return to see me in a few weeks after sperm banking, MRI, port placement and teaching prior to starting treatment.  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 Pending evaluation of right adrenal mass. 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 Recommend additional evaluation. If no visceral metastasis, then would be stage III, good risk. Treatment will be BEP x 3. Chemotherapy teaching for  BEP Staging CT  Plan for BEP q21 days  Right adrenal mass (HCC) Seminoma to the adrenal gland is very rare. Recommend further evaluation. Delayed due to insurance reason. Now scheduled. MRI adrenal protocol and biopsy if imaging cannot determine.  Left leg pain Stat US   The patient understands the plans discussed today and is in agreement with them.  He knows to contact our office if he develops concerns prior to his next appointment.   [name], MD  [facility] [address] [place] [address] Dept: [phone] Dept Fax: [phone]   No orders of the defined types were placed in this encounter.     CHIEF COMPLAINT:  CC: seminoma  INTERVAL HISTORY:  Josuah is here today for repeat clinical assessment. He denies fevers or chills. He denies groin or testicular pain. His appetite is good.  Left side leg pain including shooting to the hip and back down. No chest pain, short of breath. No bleeding, bloody stool. He reports more lower back pain since the surgery.  He got denied of medicaid and applying again.  I have reviewed the past medical history, past surgical history, social history and family history with the patient and they are unchanged from previous note.  ALLERGIES:  has No Known Allergies.  MEDICATIONS:  Current Outpatient Medications  Medication Sig Dispense Refill   acetaminophen (TYLENOL) 650 MG CR tablet Take 1,300 mg by mouth every 8 (eight) hours as needed for pain.  albuterol (VENTOLIN HFA) 108 (90 Base) MCG/ACT inhaler Inhale 1 puff into the lungs every 6 (six) hours as needed for wheezing or shortness of breath.     nicotine (NICODERM CQ - DOSED IN MG/24 HR) 7 mg/24hr patch Place 1 patch (7 mg total) onto the skin daily for 28 days. 28 patch 0   [START ON 06/24/2023] dexamethasone (DECADRON) 4 MG tablet Take 2 tablets (8 mg total) by mouth daily. Start the day after last  cisplatin dose on day 6 of chemo x 3 days.Take with food. (Patient not taking: Reported on 06/21/2023) 30 tablet 1   [START ON 06/24/2023] lidocaine-prilocaine (EMLA) cream Apply to affected area once (Patient not taking: Reported on 06/21/2023) 30 g 3   [START ON 06/24/2023] ondansetron (ZOFRAN) 8 MG tablet Take 1 tablet (8 mg total) by mouth every 8 (eight) hours as needed for nausea or vomiting. Start on the third day after last cisplatin dose on day 8 of chemo (Patient not taking: Reported on 06/21/2023) 30 tablet 1   oxyCODONE-acetaminophen (PERCOCET) 5-325 MG tablet Take 1 tablet by mouth every 4 (four) hours as needed for severe pain. (Patient not taking: Reported on 06/21/2023) 15 tablet 0   [START ON 06/24/2023] prochlorperazine (COMPAZINE) 10 MG tablet Take 1 tablet (10 mg total) by mouth every 6 (six) hours as needed for nausea or vomiting. (Patient not taking: Reported on 06/21/2023) 30 tablet 1   No current facility-administered medications for this visit.    HISTORY OF PRESENT ILLNESS:   Oncology History  Primary seminoma of left testis (HCC)  04/28/2023 Initial Diagnosis   Primary seminoma of left testis [facility]) Presented to ED for worsening testicular pain and presented with 2 months history of testicular mass.   04/28/2023 Imaging   [place] 1. Positive for abnormally enlarged and heterogeneous but vascular Left Testis highly suspicious for Testicular Neoplasm. Recommend Urology consultation. 2. Negative for testicular torsion.  Right testis appears normal.   04/28/2023 Imaging   CT CAP 1. Partially visualized thickening of the skin of the left scrotum. 2. Prominent bilateral axillary lymph nodes, right greater than left. Suspect at least 2 lymph nodes with abnormal cortical thickening in the right axilla. 3. Shotty retroperitoneal, bilateral iliac chain lymph nodes measuring up to 1.1 cm in short axis. 4. Bilateral inguinal lymph nodes with borderline thickened left inguinal lymph  nodes measuring up to 1.1 cm in short axis. 5. 1.2 cm right adrenal mass, indeterminate. Attention on follow-up is recommended. Alternatively evaluation with MRI may be considered. 6. Hazy attenuation within the anterior mediastinum without defined mass may represent residual thymus. Attention on follow-up is recommended. 7. Cholelithiasis. 8. L4-L5 bilateral pars articularis defect. 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 10. Further evaluation with axillary and inguinal ultrasound may be considered.   05/02/2023 Surgery   TESTICLE, LEFT, ORCHIECTOMY:  Seminoma, size 6.1 cm  Tumor limited to testis with rete testis and lymphovascular invasion  (pT2)  Germ cell neoplasia in situ is present  Spermatic cord and all margins of resection are negative for tumor     07/09/2023 -  Chemotherapy   Patient is on Treatment Plan : TESTICULAR BEP q21d x 3 Cycles         REVIEW OF SYSTEMS:   All relevant systems were reviewed with the patient and are negative.   VITALS:  Blood pressure (!) 140/90, pulse 91, temperature (!) 97.2 F (36.2 C), temperature source Oral, resp. rate 17, weight 257  lb (116.6 kg), SpO2 100%.  Wt Readings from Last 3 Encounters:  06/21/23 257 lb (116.6 kg)  06/18/23 252 lb (114.3 kg)  05/11/23 252 lb 11.2 oz (114.6 kg)    Body mass index is 35.84 kg/m.  Performance status (ECOG): 1 - Symptomatic but completely ambulatory  PHYSICAL EXAM:   GENERAL: alert, no distress and comfortable SKIN: skin color normal, no redness  EYES: normal, sclera clear OROPHARYNX: no exudate, no erythema    NECK: supple,  non-tender, without nodularity LYMPH:  no palpable cervical or axillary lymphadenopathy LUNGS: clear to auscultation with normal breathing effort.  No wheeze or rales HEART: regular rate & rhythm and no murmurs  ABDOMEN: abdomen soft, non-tender and nondistended Musculoskeletal: no right edema. Mild left ankle edema NEURO:  alert  LABORATORY DATA:  I have reviewed the data as listed    Component Value Date/Time   NA 137 06/20/2023 0846   K 4.1 06/20/2023 0846   CL 104 06/20/2023 0846   CO2 26 06/20/2023 0846   GLUCOSE 88 06/20/2023 0846   BUN 16 06/20/2023 0846   CREATININE 1.06 06/20/2023 0846   CALCIUM 9.1 06/20/2023 0846   PROT 7.7 06/20/2023 0846   ALBUMIN 4.2 06/20/2023 0846   AST 17 06/20/2023 0846   ALT 26 06/20/2023 0846   ALKPHOS 64 06/20/2023 0846   BILITOT 0.6 06/20/2023 0846   GFRNONAA >60 06/20/2023 0846   GFRAA >90 06/16/2012 0048    No results found for: "SPEP", "UPEP"  Lab Results  Component Value Date   WBC 6.1 06/20/2023   NEUTROABS 3.2 06/20/2023   HGB 14.9 06/20/2023   HCT 44.7 06/20/2023   MCV 85.3 06/20/2023   PLT 97 (L) 06/20/2023      Chemistry      Component Value Date/Time   NA 137 06/20/2023 0846   K 4.1 06/20/2023 0846   CL 104 06/20/2023 0846   CO2 26 06/20/2023 0846   BUN 16 06/20/2023 0846   CREATININE 1.06 06/20/2023 0846      Component Value Date/Time   CALCIUM 9.1 06/20/2023 0846   ALKPHOS 64 06/20/2023 0846   AST 17 06/20/2023 0846   ALT 26 06/20/2023 0846   BILITOT 0.6 06/20/2023 0846       RADIOGRAPHIC STUDIES: I have personally reviewed the radiological images as listed and agreed with the findings in the report. IR IMAGING GUIDED PORT INSERTION  Result Date: 06/18/2023 CLINICAL DATA:  Metastatic seminoma of the left testicle and need for porta cath to begin chemotherapy. EXAM: IMPLANTED PORT A CATH PLACEMENT WITH ULTRASOUND AND FLUOROSCOPIC GUIDANCE ANESTHESIA/SEDATION: Moderate (conscious) sedation was employed during this procedure. A total of Versed 4.0 mg and Fentanyl 100 mcg was administered intravenously. Moderate Sedation Time: 35 minutes. The patient's level of consciousness and vital signs were monitored continuously by radiology nursing throughout the procedure under my direct supervision. FLUOROSCOPY: 43 seconds.  6.0 mGy.  PROCEDURE: The procedure, risks, benefits, and alternatives were explained to the patient. Questions regarding the procedure were encouraged and answered. The patient understands and consents to the procedure. A time-out was performed prior to initiating the procedure. Ultrasound was utilized to confirm patency of the right internal jugular vein. An ultrasound image was saved and recorded. The right neck and chest were prepped with chlorhexidine in a sterile fashion, and a sterile drape was applied covering the operative field. Maximum barrier sterile technique with sterile gowns and gloves were used for the procedure. Local anesthesia was provided with 1% lidocaine. After  creating a small venotomy incision, a 21 gauge needle was advanced into the right internal jugular vein under direct, real-time ultrasound guidance. Ultrasound image documentation was performed. After securing guidewire access, an 8 Fr dilator was placed. A J-wire was kinked to measure appropriate catheter length. A subcutaneous port pocket was then created along the upper chest wall utilizing sharp and blunt dissection. Portable cautery was utilized. The pocket was irrigated with sterile saline. A single lumen power injectable port was chosen for placement. The 8 Fr catheter was tunneled from the port pocket site to the venotomy incision. The port was placed in the pocket. External catheter was trimmed to appropriate length based on guidewire measurement. At the venotomy, an 8 Fr peel-away sheath was placed over a guidewire. The catheter was then placed through the sheath and the sheath removed. Final catheter positioning was confirmed and documented with a fluoroscopic spot image. The port was accessed with a needle and aspirated and flushed with heparinized saline. The access needle was removed. The venotomy and port pocket incisions were closed with subcutaneous 3-0 Monocryl and subcuticular 4-0 Vicryl. Dermabond was applied to both incisions.  COMPLICATIONS: COMPLICATIONS None FINDINGS: After catheter placement, the tip lies at the cavo-atrial junction. The catheter aspirates normally and is ready for immediate use. IMPRESSION: Placement of single lumen port a cath via right internal jugular vein. The catheter tip lies at the cavo-atrial junction. A power injectable port a cath was placed and is ready for immediate use. Electronically Signed   By: Irish Lack M.D.   On: 06/18/2023 16:19

## 2023-06-19 NOTE — Telephone Encounter (Signed)
TC to pt to find out if PFTs and MRI ordered by Dr. Cherly Hensen have been scheduled or completed. Pt states he knows nothing about PFT and that he went to have MRI on 06/04/23 and it was denied due to him having no insurance. Reports that he is waiting to get approved for Medicaid. Informed Dr. Cherly Hensen, who requests SW consult (order placed), and discussed situation via secure chat w/ N. [place], LCSW and Ramon Dredge, LCSW.   Later in afternoon, set up PFT for pt at [facility] on 10/31 at 1:00, following his visit w/ Dr. Cherly Hensen. Pt notified and states this time will work for him. Per SW information, advised pt to call central scheduling at [phone] to try to reschedule MRI ordered by Dr. Cherly Hensen, informing scheduler that he has applied for Medicaid, which can pay for exam in retrospect if bill is deferred. Pt then states that he found out by a mailed letter that his request for Medicaid was denied; however, he is reapplying due to being out of work since September (was receiving some income when first applied). He has qualified for food stamps, so he believes he will qualify for Medicaid.  Pt states he did not f/u with fertility specialist--he decided he did not want to proceed w/ this. Dr. Cherly Hensen made aware of all of the above information. He will be seeing pt on 10/31 at 11:30.

## 2023-06-19 NOTE — Progress Notes (Signed)
Received call from patient regarding ACS application needing completed by provider office. Advised patient when he arrives for chemoed class tomorrow to ask her to complete or forward to his nurse. He verbalized understanding.  Discussed one-time $1000 Marketing executive to assist with personal expenses while going through treatment. Advised what is needed to apply. He will bring tomorrow as well and provide to registration to be scanned and emailed to me. He will then be given grant paperwork to complete and given a copy as well as expense sheet to review. He will contact me at earliest convenience to discuss expenses in detail. Briefly discussed gas cards and how they are obtained.  He has my card to do so and for any additional financial questions or concerns.

## 2023-06-20 ENCOUNTER — Telehealth: Payer: Self-pay

## 2023-06-20 ENCOUNTER — Other Ambulatory Visit: Payer: Self-pay

## 2023-06-20 ENCOUNTER — Inpatient Hospital Stay: Payer: Medicaid Other

## 2023-06-20 ENCOUNTER — Encounter: Payer: Self-pay | Admitting: *Deleted

## 2023-06-20 DIAGNOSIS — M79605 Pain in left leg: Secondary | ICD-10-CM | POA: Diagnosis not present

## 2023-06-20 DIAGNOSIS — I82402 Acute embolism and thrombosis of unspecified deep veins of left lower extremity: Secondary | ICD-10-CM | POA: Insufficient documentation

## 2023-06-20 DIAGNOSIS — Z7901 Long term (current) use of anticoagulants: Secondary | ICD-10-CM | POA: Insufficient documentation

## 2023-06-20 DIAGNOSIS — F17210 Nicotine dependence, cigarettes, uncomplicated: Secondary | ICD-10-CM | POA: Insufficient documentation

## 2023-06-20 DIAGNOSIS — C6292 Malignant neoplasm of left testis, unspecified whether descended or undescended: Secondary | ICD-10-CM | POA: Insufficient documentation

## 2023-06-20 DIAGNOSIS — E279 Disorder of adrenal gland, unspecified: Secondary | ICD-10-CM | POA: Insufficient documentation

## 2023-06-20 LAB — CBC WITH DIFFERENTIAL (CANCER CENTER ONLY)
Abs Immature Granulocytes: 0.02 10*3/uL (ref 0.00–0.07)
Basophils Absolute: 0 10*3/uL (ref 0.0–0.1)
Basophils Relative: 0 %
Eosinophils Absolute: 0.1 10*3/uL (ref 0.0–0.5)
Eosinophils Relative: 2 %
HCT: 44.7 % (ref 39.0–52.0)
Hemoglobin: 14.9 g/dL (ref 13.0–17.0)
Immature Granulocytes: 0 %
Lymphocytes Relative: 37 %
Lymphs Abs: 2.2 10*3/uL (ref 0.7–4.0)
MCH: 28.4 pg (ref 26.0–34.0)
MCHC: 33.3 g/dL (ref 30.0–36.0)
MCV: 85.3 fL (ref 80.0–100.0)
Monocytes Absolute: 0.6 10*3/uL (ref 0.1–1.0)
Monocytes Relative: 9 %
Neutro Abs: 3.2 10*3/uL (ref 1.7–7.7)
Neutrophils Relative %: 52 %
Platelet Count: 97 10*3/uL — ABNORMAL LOW (ref 150–400)
RBC: 5.24 MIL/uL (ref 4.22–5.81)
RDW: 13.8 % (ref 11.5–15.5)
WBC Count: 6.1 10*3/uL (ref 4.0–10.5)
nRBC: 0 % (ref 0.0–0.2)

## 2023-06-20 LAB — CMP (CANCER CENTER ONLY)
ALT: 26 U/L (ref 0–44)
AST: 17 U/L (ref 15–41)
Albumin: 4.2 g/dL (ref 3.5–5.0)
Alkaline Phosphatase: 64 U/L (ref 38–126)
Anion gap: 7 (ref 5–15)
BUN: 16 mg/dL (ref 6–20)
CO2: 26 mmol/L (ref 22–32)
Calcium: 9.1 mg/dL (ref 8.9–10.3)
Chloride: 104 mmol/L (ref 98–111)
Creatinine: 1.06 mg/dL (ref 0.61–1.24)
GFR, Estimated: >60 mL/min (ref >60)
Glucose, Bld: 88 mg/dL (ref 70–99)
Potassium: 4.1 mmol/L (ref 3.5–5.1)
Sodium: 137 mmol/L (ref 135–145)
Total Bilirubin: 0.6 mg/dL (ref 0.3–1.2)
Total Protein: 7.7 g/dL (ref 6.5–8.1)

## 2023-06-20 LAB — LACTATE DEHYDROGENASE: LDH: 161 U/L (ref 98–192)

## 2023-06-20 NOTE — Progress Notes (Signed)
Patient provided documents for [employer] at check-in. He signed the grant paperwork and received a copy of the approval letter as well as expense sheet in green folder for him to review and call me at earliest convenience to discuss. He received a gas card today from his grant per his request.  He has my card to do so and for any additional financial questions or concerns.

## 2023-06-20 NOTE — Telephone Encounter (Signed)
[facility] Clinical Social Work  Clinical Social Work was referred by medical provider for assessment of psychosocial needs.  Clinical Social Worker attempted to contact patient by phone to offer support and assess for needs.  CSW unable to leave patient vm due to vm not being set up. CSW will attempt again.  Marguerita Merles, LCSWA Clinical Social Worker [facility]

## 2023-06-20 NOTE — Progress Notes (Signed)
[facility] Clinical Social Work  Clinical Social Work was referred by medical provider for assessment of psychosocial needs.  Clinical Social Worker contacted patient by phone to offer support and assess for needs. CSW informed patient of purpose of call and discussed medicaid application. Patient re-submitted application and feels that he meets financial eligibility. CSW and patient discussed the [place] [place] (patient has already been connected with Everlean Alstrom). Patient will contact CSW once he receives status letter from [facility].     Marguerita Merles, LCSWA Clinical Social Worker [facility]

## 2023-06-21 ENCOUNTER — Other Ambulatory Visit (HOSPITAL_COMMUNITY): Payer: Self-pay

## 2023-06-21 ENCOUNTER — Other Ambulatory Visit: Payer: Self-pay

## 2023-06-21 ENCOUNTER — Ambulatory Visit (HOSPITAL_COMMUNITY)
Admission: RE | Admit: 2023-06-21 | Discharge: 2023-06-21 | Disposition: A | Discharge status: Home / Self Care | Payer: Medicaid Other | Source: Ambulatory Visit

## 2023-06-21 ENCOUNTER — Ambulatory Visit (HOSPITAL_BASED_OUTPATIENT_CLINIC_OR_DEPARTMENT_OTHER)
Admission: RE | Admit: 2023-06-21 | Discharge: 2023-06-21 | Disposition: A | Discharge status: Home / Self Care | Payer: Medicaid Other | Source: Ambulatory Visit

## 2023-06-21 ENCOUNTER — Telehealth: Payer: Self-pay

## 2023-06-21 ENCOUNTER — Inpatient Hospital Stay (HOSPITAL_BASED_OUTPATIENT_CLINIC_OR_DEPARTMENT_OTHER): Payer: Medicaid Other

## 2023-06-21 VITALS — BP 140/90 | HR 91 | Temp 97.2°F | Resp 17 | Wt 257.0 lb

## 2023-06-21 DIAGNOSIS — M79605 Pain in left leg: Secondary | ICD-10-CM | POA: Insufficient documentation

## 2023-06-21 DIAGNOSIS — C6292 Malignant neoplasm of left testis, unspecified whether descended or undescended: Secondary | ICD-10-CM | POA: Insufficient documentation

## 2023-06-21 DIAGNOSIS — I82401 Acute embolism and thrombosis of unspecified deep veins of right lower extremity: Secondary | ICD-10-CM | POA: Diagnosis not present

## 2023-06-21 DIAGNOSIS — E278 Other specified disorders of adrenal gland: Secondary | ICD-10-CM

## 2023-06-21 LAB — PULMONARY FUNCTION TEST
DL/VA % pred: 95 %
DL/VA: 4.57 ml/min/mmHg/L
DLCO cor % pred: 82 %
DLCO cor: 27.24 ml/min/mmHg
DLCO unc % pred: 83 %
DLCO unc: 27.47 ml/min/mmHg
FEF 25-75 Post: 5.65 L/s
FEF 25-75 Pre: 4.38 L/s
FEF2575-%Change-Post: 29 %
FEF2575-%Pred-Post: 129 %
FEF2575-%Pred-Pre: 100 %
FEV1-%Change-Post: 6 %
FEV1-%Pred-Post: 96 %
FEV1-%Pred-Pre: 90 %
FEV1-Post: 4.35 L
FEV1-Pre: 4.08 L
FEV1FVC-%Change-Post: 3 %
FEV1FVC-%Pred-Pre: 104 %
FEV6-%Change-Post: 3 %
FEV6-%Pred-Post: 90 %
FEV6-%Pred-Pre: 88 %
FEV6-Post: 4.97 L
FEV6-Pre: 4.83 L
FEV6FVC-%Change-Post: 0 %
FEV6FVC-%Pred-Post: 101 %
FEV6FVC-%Pred-Pre: 101 %
FVC-%Change-Post: 2 %
FVC-%Pred-Post: 89 %
FVC-%Pred-Pre: 86 %
FVC-Post: 4.97 L
FVC-Pre: 4.84 L
Post FEV1/FVC ratio: 87 %
Post FEV6/FVC ratio: 100 %
Pre FEV1/FVC ratio: 84 %
Pre FEV6/FVC Ratio: 100 %
RV % pred: 43 %
RV: 0.77 L
TLC % pred: 80 %
TLC: 5.71 L

## 2023-06-21 MED ORDER — APIXABAN (ELIQUIS) VTE STARTER PACK (10MG AND 5MG)
ORAL_TABLET | ORAL | 0 refills | Status: DC
  Filled 2023-06-21: qty 74, 30d supply, fill #0

## 2023-06-21 MED ORDER — ALBUTEROL SULFATE (2.5 MG/3ML) 0.083% IN NEBU
2.5000 mg | INHALATION_SOLUTION | Freq: Once | RESPIRATORY_TRACT | Status: AC
  Administered 2023-06-21: 2.5 mg via RESPIRATORY_TRACT

## 2023-06-21 NOTE — Assessment & Plan Note (Signed)
pT2 cN1M1 normal TM. Pending evaluation of right adrenal mass. 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 Recommend additional evaluation. If no visceral metastasis, then would be stage III, good risk. Treatment will be BEP x 3. Chemotherapy teaching for BEP Staging CT  Plan for BEP q21 days

## 2023-06-21 NOTE — Assessment & Plan Note (Signed)
Stat US

## 2023-06-21 NOTE — Assessment & Plan Note (Signed)
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 Recommend further evaluation. Delayed due to insurance reason. Now scheduled. MRI adrenal protocol and biopsy if imaging cannot determine.

## 2023-06-21 NOTE — Telephone Encounter (Signed)
-----   Message from [name] sent at 06/21/2023  4:04 PM EDT ----- Steve Stewart please let him know PFT was normal. Thanks

## 2023-06-21 NOTE — Telephone Encounter (Signed)
Left patient a message regarding new dates and start times for treatment left call back number if needed for rescheduling

## 2023-06-21 NOTE — Telephone Encounter (Signed)
Pt advised via MyChart message. °

## 2023-06-22 ENCOUNTER — Inpatient Hospital Stay (HOSPITAL_COMMUNITY)
Admission: EM | Admit: 2023-06-22 | Discharge: 2023-06-25 | DRG: 176 | Disposition: A | Discharge status: Home / Self Care | Payer: 59 | Attending: Student in an Organized Health Care Education/Training Program | Admitting: Student in an Organized Health Care Education/Training Program

## 2023-06-22 ENCOUNTER — Other Ambulatory Visit: Payer: Self-pay

## 2023-06-22 ENCOUNTER — Ambulatory Visit (HOSPITAL_COMMUNITY): Payer: Self-pay

## 2023-06-22 ENCOUNTER — Encounter (HOSPITAL_COMMUNITY): Payer: Self-pay | Admitting: Emergency Medicine

## 2023-06-22 ENCOUNTER — Ambulatory Visit (HOSPITAL_COMMUNITY)
Admission: RE | Admit: 2023-06-22 | Discharge: 2023-06-22 | Disposition: A | Discharge status: Home / Self Care | Payer: 59 | Source: Ambulatory Visit

## 2023-06-22 ENCOUNTER — Emergency Department (HOSPITAL_COMMUNITY): Payer: 59

## 2023-06-22 DIAGNOSIS — E861 Hypovolemia: Secondary | ICD-10-CM | POA: Diagnosis present

## 2023-06-22 DIAGNOSIS — I2602 Saddle embolus of pulmonary artery with acute cor pulmonale: Secondary | ICD-10-CM | POA: Diagnosis not present

## 2023-06-22 DIAGNOSIS — I2690 Septic pulmonary embolism without acute cor pulmonale: Secondary | ICD-10-CM | POA: Diagnosis not present

## 2023-06-22 DIAGNOSIS — C6210 Malignant neoplasm of unspecified descended testis: Secondary | ICD-10-CM

## 2023-06-22 DIAGNOSIS — I2601 Septic pulmonary embolism with acute cor pulmonale: Secondary | ICD-10-CM | POA: Diagnosis not present

## 2023-06-22 DIAGNOSIS — Z1152 Encounter for screening for COVID-19: Secondary | ICD-10-CM

## 2023-06-22 DIAGNOSIS — C779 Secondary and unspecified malignant neoplasm of lymph node, unspecified: Secondary | ICD-10-CM | POA: Diagnosis present

## 2023-06-22 DIAGNOSIS — Z9079 Acquired absence of other genital organ(s): Secondary | ICD-10-CM

## 2023-06-22 DIAGNOSIS — F17210 Nicotine dependence, cigarettes, uncomplicated: Secondary | ICD-10-CM | POA: Diagnosis present

## 2023-06-22 DIAGNOSIS — E278 Other specified disorders of adrenal gland: Secondary | ICD-10-CM

## 2023-06-22 DIAGNOSIS — I2699 Other pulmonary embolism without acute cor pulmonale: Principal | ICD-10-CM | POA: Diagnosis present

## 2023-06-22 DIAGNOSIS — Z21 Asymptomatic human immunodeficiency virus [HIV] infection status: Secondary | ICD-10-CM | POA: Diagnosis present

## 2023-06-22 DIAGNOSIS — E871 Hypo-osmolality and hyponatremia: Secondary | ICD-10-CM | POA: Diagnosis present

## 2023-06-22 DIAGNOSIS — C6212 Malignant neoplasm of descended left testis: Secondary | ICD-10-CM | POA: Diagnosis present

## 2023-06-22 DIAGNOSIS — B20 Human immunodeficiency virus [HIV] disease: Secondary | ICD-10-CM | POA: Diagnosis not present

## 2023-06-22 DIAGNOSIS — Z86718 Personal history of other venous thrombosis and embolism: Secondary | ICD-10-CM

## 2023-06-22 LAB — RESP PANEL BY RT-PCR (RSV, FLU A&B, COVID)  RVPGX2
Influenza A by PCR: NEGATIVE
Influenza B by PCR: NEGATIVE
Resp Syncytial Virus by PCR: NEGATIVE
SARS Coronavirus 2 by RT PCR: NEGATIVE

## 2023-06-22 LAB — CBC WITH DIFFERENTIAL/PLATELET
Abs Immature Granulocytes: 0.01 10*3/uL (ref 0.00–0.07)
Basophils Absolute: 0 10*3/uL (ref 0.0–0.1)
Basophils Relative: 0 %
Eosinophils Absolute: 0.1 10*3/uL (ref 0.0–0.5)
Eosinophils Relative: 1 %
HCT: 43.1 % (ref 39.0–52.0)
Hemoglobin: 14.3 g/dL (ref 13.0–17.0)
Immature Granulocytes: 0 %
Lymphocytes Relative: 33 %
Lymphs Abs: 2.6 10*3/uL (ref 0.7–4.0)
MCH: 28.6 pg (ref 26.0–34.0)
MCHC: 33.2 g/dL (ref 30.0–36.0)
MCV: 86.2 fL (ref 80.0–100.0)
Monocytes Absolute: 0.7 10*3/uL (ref 0.1–1.0)
Monocytes Relative: 9 %
Neutro Abs: 4.5 10*3/uL (ref 1.7–7.7)
Neutrophils Relative %: 57 %
Platelets: 105 10*3/uL — ABNORMAL LOW (ref 150–400)
RBC: 5 MIL/uL (ref 4.22–5.81)
RDW: 13.7 % (ref 11.5–15.5)
WBC: 8 10*3/uL (ref 4.0–10.5)
nRBC: 0 % (ref 0.0–0.2)

## 2023-06-22 LAB — COMPREHENSIVE METABOLIC PANEL
ALT: 27 U/L (ref 0–44)
AST: 25 U/L (ref 15–41)
Albumin: 3.9 g/dL (ref 3.5–5.0)
Alkaline Phosphatase: 62 U/L (ref 38–126)
Anion gap: 10 (ref 5–15)
BUN: 14 mg/dL (ref 6–20)
CO2: 22 mmol/L (ref 22–32)
Calcium: 9.2 mg/dL (ref 8.9–10.3)
Chloride: 95 mmol/L — ABNORMAL LOW (ref 98–111)
Creatinine, Ser: 0.83 mg/dL (ref 0.61–1.24)
GFR, Estimated: >60 mL/min (ref >60)
Glucose, Bld: 99 mg/dL (ref 70–99)
Potassium: 4.7 mmol/L (ref 3.5–5.1)
Sodium: 127 mmol/L — ABNORMAL LOW (ref 135–145)
Total Bilirubin: 1.3 mg/dL — ABNORMAL HIGH (ref 0.3–1.2)
Total Protein: 7.8 g/dL (ref 6.5–8.1)

## 2023-06-22 LAB — URINALYSIS, ROUTINE W REFLEX MICROSCOPIC
Bilirubin Urine: NEGATIVE
Glucose, UA: NEGATIVE mg/dL
Hgb urine dipstick: NEGATIVE
Ketones, ur: NEGATIVE mg/dL
Leukocytes,Ua: NEGATIVE
Nitrite: NEGATIVE
Protein, ur: NEGATIVE mg/dL
Specific Gravity, Urine: 1.025 (ref 1.005–1.030)
pH: 6 (ref 5.0–8.0)

## 2023-06-22 LAB — APTT: aPTT: 33 s (ref 24–36)

## 2023-06-22 LAB — HEPARIN LEVEL (UNFRACTIONATED): Heparin Unfractionated: >1.1 [IU]/mL — ABNORMAL HIGH (ref 0.30–0.70)

## 2023-06-22 MED ORDER — MELATONIN 5 MG PO TABS
5.0000 mg | ORAL_TABLET | Freq: Every evening | ORAL | Status: DC | PRN
  Filled 2023-06-22: qty 1

## 2023-06-22 MED ORDER — FENTANYL CITRATE PF 50 MCG/ML IJ SOSY
50.0000 ug | PREFILLED_SYRINGE | Freq: Once | INTRAMUSCULAR | Status: AC
  Administered 2023-06-22: 50 ug via INTRAVENOUS
  Filled 2023-06-22: qty 1

## 2023-06-22 MED ORDER — ALBUTEROL SULFATE (2.5 MG/3ML) 0.083% IN NEBU: 3.0000 mL | INHALATION_SOLUTION | Freq: Four times a day (QID) | RESPIRATORY_TRACT | Status: DC | PRN

## 2023-06-22 MED ORDER — OXYCODONE HCL 5 MG PO TABS
5.0000 mg | ORAL_TABLET | Freq: Four times a day (QID) | ORAL | Status: DC | PRN
  Administered 2023-06-23 – 2023-06-25 (×7): 5 mg via ORAL
  Filled 2023-06-22 (×7): qty 1

## 2023-06-22 MED ORDER — POLYETHYLENE GLYCOL 3350 17 G PO PACK: 17.0000 g | PACK | Freq: Every day | ORAL | Status: DC | PRN

## 2023-06-22 MED ORDER — SODIUM CHLORIDE 0.9 % IV BOLUS
1000.0000 mL | Freq: Once | INTRAVENOUS | Status: AC
  Administered 2023-06-22: 1000 mL via INTRAVENOUS

## 2023-06-22 MED ORDER — IOHEXOL 350 MG/ML SOLN
75.0000 mL | Freq: Once | INTRAVENOUS | Status: AC | PRN
  Administered 2023-06-22: 75 mL via INTRAVENOUS

## 2023-06-22 MED ORDER — ACETAMINOPHEN 325 MG PO TABS
650.0000 mg | ORAL_TABLET | Freq: Four times a day (QID) | ORAL | Status: DC | PRN
  Administered 2023-06-23 – 2023-06-25 (×5): 650 mg via ORAL
  Filled 2023-06-22 (×5): qty 2

## 2023-06-22 MED ORDER — GADOBUTROL 1 MMOL/ML IV SOLN
10.0000 mL | Freq: Once | INTRAVENOUS | Status: AC | PRN
Start: 2023-06-22 — End: 2023-06-22
  Administered 2023-06-22: 10 mL via INTRAVENOUS

## 2023-06-22 MED ORDER — NICOTINE 7 MG/24HR TD PT24
7.0000 mg | MEDICATED_PATCH | Freq: Every day | TRANSDERMAL | Status: DC
  Administered 2023-06-23 – 2023-06-25 (×3): 7 mg via TRANSDERMAL
  Filled 2023-06-22 (×3): qty 1

## 2023-06-22 MED ORDER — PROCHLORPERAZINE EDISYLATE 10 MG/2ML IJ SOLN: 5.0000 mg | Freq: Four times a day (QID) | INTRAMUSCULAR | Status: DC | PRN

## 2023-06-22 NOTE — Telephone Encounter (Signed)
Pain in right shoulder to mid back hurting. Took 2 tylenol 650 mg without relief. Painful when taking a deep breath. States he was not hurting prior to PFT yesterday. " I'm trying to sit still and take shallow breaths so I don't hurt.I was hurting so bad at 2:00 am , I almost called the ambulance." Took a Percocet 5-325 and has some relief.

## 2023-06-22 NOTE — ED Provider Notes (Signed)
 [place] EMERGENCY DEPARTMENT AT [facility] Provider Note   CSN: [id number] Arrival date & time: 06/22/23  1943     History Chief Complaint  Patient presents with   Generalized pain    Steve Stewart is a 32 y.o. male.  Patient past history significant for primary seminoma of the left testes, left leg DVT presents to the emergency department concerns of generalized pain.  Reports excessive start chemotherapy for testicular cancer on Monday but this has been pushed back given his current symptoms.  States he was diagnosed with a DVT and started on a blood thinner just yesterday but now endorsing pain all over particularly in the left shoulder.  Also reports some worsening shortness of breath but denies any chest pain.  Denies any fevers, chills, cough, congestion, sore throat.  HPI     Home Medications Prior to Admission medications   Medication Sig Start Date End Date Taking? Authorizing Provider  acetaminophen (TYLENOL) 650 MG CR tablet Take 1,300 mg by mouth every 8 (eight) hours as needed for pain.   Yes [provider]  albuterol (VENTOLIN HFA) 108 (90 Base) MCG/ACT inhaler Inhale 1 puff into the lungs every 6 (six) hours as needed for wheezing or shortness of breath. 10/02/22  Yes [provider]  APIXABAN Everlene Balls) VTE STARTER PACK (10MG  AND 5MG ) Take as directed on package: start with two-5mg  tablets twice daily for 7 days. On day 8, switch to one-5mg  tablet twice daily. 06/21/23  Yes [name], MD  nicotine (NICODERM CQ - DOSED IN MG/24 HR) 7 mg/24hr patch Place 1 patch (7 mg total) onto the skin daily for 28 days. 05/30/23 06/27/23 Yes [name], MD  dexamethasone (DECADRON) 4 MG tablet Take 2 tablets (8 mg total) by mouth daily. Start the day after last cisplatin dose on day 6 of chemo x 3 days.Take with food. Patient not taking: Reported on 06/21/2023 06/24/23   [name], MD  lidocaine-prilocaine (EMLA) cream Apply to affected area  once Patient not taking: Reported on 06/21/2023 06/24/23   [name], MD  ondansetron (ZOFRAN) 8 MG tablet Take 1 tablet (8 mg total) by mouth every 8 (eight) hours as needed for nausea or vomiting. Start on the third day after last cisplatin dose on day 8 of chemo Patient not taking: Reported on 06/21/2023 06/24/23   [name], MD  oxyCODONE-acetaminophen (PERCOCET) 5-325 MG tablet Take 1 tablet by mouth every 4 (four) hours as needed for severe pain. Patient not taking: Reported on 06/21/2023 05/02/23   Rene Paci, MD  prochlorperazine (COMPAZINE) 10 MG tablet Take 1 tablet (10 mg total) by mouth every 6 (six) hours as needed for nausea or vomiting. Patient not taking: Reported on 06/21/2023 06/24/23   [name], MD      Allergies    Patient has no known allergies.    Review of Systems   Review of Systems  Constitutional:        Generalized pain  Musculoskeletal:  Positive for myalgias.  All other systems reviewed and are negative.   Physical Exam Updated Vital Signs BP 127/83   Pulse 85   Temp 99 F (37.2 C) (Oral)   Resp (!) 26   SpO2 97%  Physical Exam Vitals and nursing note reviewed.  Constitutional:      General: He is not in acute distress.    Appearance: He is well-developed.  HENT:     Head: Normocephalic and atraumatic.  Eyes:     Conjunctiva/sclera: Conjunctivae normal.  Cardiovascular:     Rate and Rhythm: Normal rate and regular rhythm.     Heart sounds: No murmur heard. Pulmonary:     Effort: Pulmonary effort is normal. No respiratory distress.     Breath sounds: Normal breath sounds.     Comments: Some crackles noted to right side Abdominal:     Palpations: Abdomen is soft.     Tenderness: There is no abdominal tenderness.  Musculoskeletal:        General: No swelling.     Cervical back: Neck supple.  Skin:    General: Skin is warm and dry.     Capillary Refill: Capillary refill takes less than 2 seconds.   Neurological:     Mental Status: He is alert.  Psychiatric:        Mood and Affect: Mood normal.     ED Results / Procedures / Treatments   Labs (all labs ordered are listed, but only abnormal results are displayed) Labs Reviewed  COMPREHENSIVE METABOLIC PANEL - Abnormal; Notable for the following components:      Result Value   Sodium 127 (*)    Chloride 95 (*)    Total Bilirubin 1.3 (*)    All other components within normal limits  CBC WITH DIFFERENTIAL/PLATELET - Abnormal; Notable for the following components:   Platelets 105 (*)    All other components within normal limits  RESP PANEL BY RT-PCR (RSV, FLU A&B, COVID)  RVPGX2  URINALYSIS, ROUTINE W REFLEX MICROSCOPIC  APTT  HEPARIN LEVEL (UNFRACTIONATED)  HIV ANTIBODY (ROUTINE TESTING W REFLEX)  CBC  BASIC METABOLIC PANEL  MAGNESIUM  PHOSPHORUS    EKG EKG Interpretation Date/Time:  Friday June 22 2023 20:08:15 EDT Ventricular Rate:  93 PR Interval:  144 QRS Duration:  95 QT Interval:  336 QTC Calculation: 418 R Axis:   70  Text Interpretation: Sinus rhythm ST elevation suggests acute pericarditis No previous ECGs available Confirmed by Elayne Snare ([id number]) on 06/22/2023 9:19:26 PM  Radiology CT Angio Chest PE W and/or Wo Contrast  Result Date: 06/22/2023 CLINICAL DATA:  Recent diagnosis left testicular cancer with orchiectomy 05/02/2023, imaging guided right chest port insertion 06/18/2023, presents with dyspnea and chest wall pain with suspected DVT. EXAM: CT ANGIOGRAPHY CHEST WITH CONTRAST TECHNIQUE: Multidetector CT imaging of the chest was performed using the standard protocol during bolus administration of intravenous contrast. Multiplanar CT image reconstructions and MIPs were obtained to evaluate the vascular anatomy. RADIATION DOSE REDUCTION: This exam was performed according to the departmental dose-optimization program which includes automated exposure control, adjustment of the mA and/or kV  according to patient size and/or use of iterative reconstruction technique. CONTRAST:  75mL OMNIPAQUE IOHEXOL 350 MG/ML SOLN COMPARISON:  Chest, abdomen pelvis CT with IV contrast 04/28/2023, MRI abdomen earlier today. FINDINGS: Cardiovascular: Interval new insertion of a right chest port with IJ approach catheter terminating at about the superior cavoatrial junction. The cardiac size is upper limits normal. There is no pericardial effusion. No visible coronary calcifications. The aorta, great vessels, and pulmonary veins are normal. The pulmonary arteries are within normal caliber limits. There is hypodense acute thrombus, which grossly appears nonoccluding, in the right lower lobe main artery with extension into at least 2 posterior basal segmental arteries and with scattered thrombus in some of the downstream subsegmental posterior basilar arteries, some of which does appear occluding within the smaller arteries. In the right upper lobe, additional nonocclusive thrombus is seen  in the anterior segmental artery with extension into 1 downstream subsegmental branch. No other arterial embolic disease is seen. Overall this is a small clot burden without findings of right heart strain. Mediastinum/Nodes: Mildly enlarged bilateral hilar lymph nodes are again noted, largest on the right is 1.5 cm short axis on 4:57, unchanged. Largest on the left 1.3 cm short axis on 4:67, stable. There are shotty prevascular space nodes which were also noted previously. Largest of these is 8 mm in short axis on 4:53, with a subcarinal nodal complex measuring 1.5 cm short axis, slightly larger than previously. There is no other mediastinal adenopathy. No thyroid mass. Bilateral axillary adenopathy is redemonstrated. Again the largest right axillary node is 2.3 x 2.7 cm on 4:45, and the largest on the left 3.2 x 1.8 cm on 4:55, all unchanged. The thoracic trachea, main bronchi and thoracic esophagus are unremarkable. Lungs/Pleura: There  is patchy consolidation in the posterior basal right lower lobe which is probably related to 1 or more evolving infarctions, interspersed with ground-glass disease. Underlying pneumonia is possible in the proper clinical setting. There is increased atelectasis in the posterior left lower lobe superior segment and in the medial segment of the right middle lobe. The lungs are otherwise clear. There is no pleural effusion, thickening or pneumothorax. Upper Abdomen: Fatty liver with mild hepatosplenomegaly, splenic coronal diameter 16 cm, previously also 16 cm, with again noted 2.1 cm stone in the distal gallbladder. No acute upper abdominal abnormality. Stable bilateral adrenal adenomas. Mild elevation right hemidiaphragm. Musculoskeletal: No chest wall abnormality. No acute or significant osseous findings. Review of the MIP images confirms the above findings. IMPRESSION: 1. Acute pulmonary emboli in the right lower lobe main artery with extension into at least 2 posterior basal segmental arteries, scattered thrombus in some of the downstream subsegmental posterior basilar arteries, some of which does appear occluding within the smaller arteries. 2. Additional nonocclusive thrombus in the right upper lobe anterior segmental artery with extension into 1 downstream subsegmental branch. 3. Overall this is a small clot burden without findings of acute right heart strain. 4. Patchy consolidation in the posterior basal right lower lobe which is probably related to 1 or more evolving infarctions, interspersed with ground-glass disease. Underlying pneumonia is possible in the proper clinical setting. 5. Stable bilateral axillary adenopathy. Stable mild bihilar adenopathy. 6. Stable fatty liver with mild hepatosplenomegaly. Cholelithiasis. Stable bilateral adrenal adenomas. 7. Critical Value/emergent results were called by telephone at the time of interpretation on 06/22/2023 at 10:06 pm to provider ABIGAIL HARRIS , who verbally  acknowledged these results. Electronically Signed   By: Almira Bar M.D.   On: 06/22/2023 22:18   MR Abdomen W Wo Contrast  Result Date: 06/22/2023 CLINICAL DATA:  Characterize right adrenal mass, new diagnosis testicular cancer EXAM: MRI ABDOMEN WITHOUT AND WITH CONTRAST TECHNIQUE: Multiplanar multisequence MR imaging of the abdomen was performed both before and after the administration of intravenous contrast. CONTRAST:  10mL GADAVIST GADOBUTROL 1 MMOL/ML IV SOLN COMPARISON:  CT chest abdomen pelvis, 04/28/2023 FINDINGS: Lower chest: No acute abnormality. Hepatobiliary: No solid liver abnormality is seen. Hepatomegaly, maximum coronal span 20.0 cm. Gallstone. No gallbladder wall thickening, or biliary dilatation. Pancreas: Unremarkable. No pancreatic ductal dilatation or surrounding inflammatory changes. Spleen: Splenomegaly, maximum span 16.0 cm. Adrenals/Urinary Tract: Definitively benign, macroscopic fat containing bilateral adrenal adenomata (series 6, image 38). Kidneys are normal, without renal calculi, solid lesion, or hydronephrosis. Stomach/Bowel: Stomach is within normal limits. No evidence of bowel wall thickening, distention,  or inflammatory changes. Vascular/Lymphatic: No significant vascular findings are present. No enlarged abdominal lymph nodes. Other: No abdominal wall hernia or abnormality. No ascites. Musculoskeletal: No acute or significant osseous findings. IMPRESSION: 1. Definitively benign, macroscopic fat containing bilateral adrenal adenomata, for which no specific further follow-up or characterization is required. 2. No evidence of lymphadenopathy or metastatic disease in the abdomen. 3. Cholelithiasis. 4. Hepatosplenomegaly. Electronically Signed   By: Jearld Lesch M.D.   On: 06/22/2023 17:21   VAS [place] LOWER EXTREMITY VENOUS (DVT)  Result Date: 06/21/2023  Lower Venous DVT Study Patient Name:  Steve Stewart  Date of Exam:   06/21/2023 Medical Rec #: [id number]     Accession #:     [id number] Date of Birth: 23-Jun-1991     Patient Gender: M Patient Age:   89 years Exam Location:  [facility] Procedure:      VAS [place] LOWER EXTREMITY VENOUS (DVT) Referring Phys: RUBENS CHANG --------------------------------------------------------------------------------  Indications: Pain.  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 Limitations: Patient tension. Comparison Study: No prior study Performing Technologist: Shona Simpson  Examination Guidelines: A complete evaluation includes B-mode imaging, spectral Doppler, color Doppler, and power Doppler as needed of all accessible portions of each vessel. Bilateral testing is considered an integral part of a complete examination. Limited examinations for reoccurring indications may be performed as noted. The reflux portion of the exam is performed with the patient in reverse Trendelenburg.  +-----+---------------+---------+-----------+----------+---------------+ RIGHTCompressibilityPhasicitySpontaneityPropertiesThrombus Aging  +-----+---------------+---------+-----------+----------+---------------+ CFV                 Yes      Yes                  Patent by color +-----+---------------+---------+-----------+----------+---------------+   +---------+---------------+---------+-----------+----------+---------------+ LEFT     CompressibilityPhasicitySpontaneityPropertiesThrombus Aging  +---------+---------------+---------+-----------+----------+---------------+ CFV      Full           Yes      Yes                                  +---------+---------------+---------+-----------+----------+---------------+ SFJ      Full                                                         +---------+---------------+---------+-----------+----------+---------------+ FV Prox  Full                                                         +---------+---------------+---------+-----------+----------+---------------+ FV Mid   Full                                                          +---------+---------------+---------+-----------+----------+---------------+ FV DistalFull                                                         +---------+---------------+---------+-----------+----------+---------------+  PFV                                                   Patent by color +---------+---------------+---------+-----------+----------+---------------+ POP      None           No       No                                   +---------+---------------+---------+-----------+----------+---------------+ PTV      None                                                         +---------+---------------+---------+-----------+----------+---------------+ PERO     None                                                         +---------+---------------+---------+-----------+----------+---------------+ Soleal   Full                                                         +---------+---------------+---------+-----------+----------+---------------+ Gastroc  Full                                                         +---------+---------------+---------+-----------+----------+---------------+     Summary: RIGHT: - No evidence of common femoral vein obstruction.   LEFT: - Findings consistent with acute deep vein thrombosis involving the left popliteal vein, left posterior tibial veins, and left peroneal veins.  - No cystic structure found in the popliteal fossa.  *See table(s) above for measurements and observations. Electronically signed by Heath Lark on 06/21/2023 at 4:18:20 PM.    Final     Procedures Procedures   Medications Ordered in ED Medications  acetaminophen (TYLENOL) tablet 650 mg (has no administration in time range)  oxyCODONE (Oxy IR/ROXICODONE) immediate release tablet 5 mg (has no administration in time range)  prochlorperazine (COMPAZINE) injection 5 mg (has no administration in time range)  polyethylene  glycol (MIRALAX / GLYCOLAX) packet 17 g (has no administration in time range)  melatonin tablet 5 mg (has no administration in time range)  albuterol (PROVENTIL) (2.5 MG/3ML) 0.083% nebulizer solution 3 mL (has no administration in time range)  nicotine (NICODERM CQ - dosed in mg/24 hr) patch 7 mg (has no administration in time range)  fentaNYL (SUBLIMAZE) injection 50 mcg (50 mcg Intravenous Given 06/22/23 2100)  sodium chloride 0.9 % bolus 1,000 mL (1,000 mLs Intravenous New Bag/Given 06/22/23 2114)  iohexol (OMNIPAQUE) 350 MG/ML injection 75 mL (75 mLs Intravenous Contrast Given 06/22/23 2116)    ED Course/ Medical Decision Making/ A&P  Medical Decision Making Amount and/or Complexity of Data Reviewed Labs: ordered.  Risk Prescription drug management. Decision regarding hospitalization.   This patient presents to the ED for concern of generalized pain.  Differential diagnosis includes sepsis, pneumonia, precautions, PE   Lab Tests:  I Ordered, and personally interpreted labs.  The pertinent results include: CBC with mild leukopenia 105, CMP with sodium 127 chloride 95, UA pending, respiratory panel pending   Imaging Studies ordered:  I ordered imaging studies including CT angio chest I independently visualized and interpreted imaging which showed PE with developing pulmonary infarct I agree with the radiologist interpretation   Medicines ordered and prescription drug management:  I ordered medication including fluids, fentanyl for hyponatremia, pain Reevaluation of the patient after these medicines showed that the patient improved I have reviewed the patients home medicines and have made adjustments as needed   Problem List / ED Course:  Patient presents emergency department concerns of generalized pain.  Was diagnosed with testicular cancer in September and has not been able to have chemotherapy started due to numerous concerns.  Patient  diagnosed with a DVT yesterday in his left leg and started on Eliquis.  Has not received a dose of Eliquis today.  Endorsing pain primarily in the left shoulder but feels that the pain is generalized all over.  Denies any recent fever or chills.  Not currently on chemotherapy.  Fentanyl given for pain and fluids for mild hyponatremia 127.  CT angio chest ordered from triage given increased risk of PE given recent DVT and oncologic history. Otherwise denies any exposure to sick contacts as far as he is aware. No hemoptysis. Appears that patient started Eliquis yesterday and took one dose today prior to worsening symptoms. Concern for possible given recent DVT diagnosis and in the setting of cancer.  CT angio chest concerning for pulmonary embolism. No evidence of heart strain. Radiologist concerned for developing pulmonary infarct on the right side. Will handoff patient to oncoming provider for heparin and admission for further management and evaluation. May require oncology consult. Informed patient of this plan and he and his mother are in agreement. Care handed off to [employer], PA-C.  Final Clinical Impression(s) / ED Diagnoses Final diagnoses:  Acute pulmonary embolism, unspecified pulmonary embolism type, unspecified whether acute cor pulmonale present (HCC)  Hyponatremia    Rx / DC Orders ED Discharge Orders     None         Smitty Knudsen, PA-C 06/22/23 2339    Elayne Snare K, DO 06/23/23 0006

## 2023-06-22 NOTE — ED Provider Notes (Signed)
Handoff from [place], pending CTA PE study to r/o PE.  Physical Exam  BP (!) 154/93   Pulse 85   Temp 99 F (37.2 C) (Oral)   Resp (!) 29   SpO2 96%   Physical Exam Vitals and nursing note reviewed.  Constitutional:      General: He is not in acute distress.    Appearance: He is well-developed.  HENT:     Head: Normocephalic and atraumatic.  Eyes:     Conjunctiva/sclera: Conjunctivae normal.  Cardiovascular:     Rate and Rhythm: Normal rate and regular rhythm.     Heart sounds: No murmur heard. Pulmonary:     Effort: Pulmonary effort is normal. No respiratory distress.     Breath sounds: Normal breath sounds.  Abdominal:     Palpations: Abdomen is soft.     Tenderness: There is no abdominal tenderness.  Musculoskeletal:        General: No swelling.     Cervical back: Neck supple.  Skin:    General: Skin is warm and dry.     Capillary Refill: Capillary refill takes less than 2 seconds.  Neurological:     Mental Status: He is alert.  Psychiatric:        Mood and Affect: Mood normal.     Procedures  Procedures  ED Course / MDM    Medical Decision Making Patient with history of testicular cancer, here for shortness of breath, diagnosed with DVT Hu 1 day prior.  Has only taken 1 dose of anticoag.  CTA ordered, by Jari Favre  PA, found to have PE, with pulmonary infarct developing.  I spoke with Dr. Margo Aye, admitted to her service secondary to developing pulmonary infarct, heparin ordered.    Amount and/or Complexity of Data Reviewed Labs: ordered.  Risk Prescription drug management. Decision regarding hospitalization.         Pete Pelt, [place] 06/23/23 0003    Nira Conn, MD 06/23/23 [phone]

## 2023-06-22 NOTE — Telephone Encounter (Signed)
Make sure he is on apixaban 10 mg twice daily for one week (started last night) Please let him know we will get a CT Chest ab and pelvis to evaluation for any cancer progression. If worsening over the weekend. Report to ED. Thanks   Pressley confirmed that he is taking the apixaban BID. Verbalized understanding of the above.

## 2023-06-22 NOTE — Progress Notes (Signed)
PHARMACY - ANTICOAGULATION CONSULT NOTE  Pharmacy Consult for heparin Indication: DVT  No Known Allergies  Patient Measurements:   Heparin Dosing Weight: 91kg  Vital Signs: Temp: 99 F (37.2 C) (11/01 2001) Temp Source: Oral (11/01 2001) BP: 134/75 (11/01 2200) Pulse Rate: 78 (11/01 2200)  Labs: Recent Labs    06/20/23 0846 06/22/23 2030  HGB 14.9 14.3  HCT 44.7 43.1  PLT 97* 105*  CREATININE 1.06 0.83    Estimated Creatinine Clearance: 162.8 mL/min (by C-G formula based on SCr of 0.83 mg/dL).   Medical History: Past Medical History:  Diagnosis Date   Medical history non-contributory      Assessment: 32 yo male with recent diagnosis of testicular cancer and recent new DVT on eliquis presents with pain all over and some worsening SOB.  Pharmacy to dose heparin drip.  LD eliquis 10mg  11/1 @ 1730 Hgb 14.3, plts 105, Scr 0.83 Baseline HL > 1.1, aptt 33   Goal of Therapy:  Heparin level 0.3-0.7 units/ml aPTT 66-102 seconds Monitor platelets by anticoagulation protocol: Yes   Plan:  Starting on 11/2 @ 0530 heparin drip 1550 units/hr Aptt in level in 6 hours after starting Daily CBC    Arley Phenix RPh 06/22/2023, 10:20 PM

## 2023-06-22 NOTE — ED Triage Notes (Signed)
Pt reports being diagnosed w/ testicular cancer in sept. Pt was supposed to be started on chemo on Monday but treatment was pushed back. Pt was diagnosed w/ blood clot in leg and started on blood thinner yesterday but now reports pain all over especially in shoulder. Pt also reports SHOB.

## 2023-06-23 ENCOUNTER — Inpatient Hospital Stay (HOSPITAL_COMMUNITY): Payer: 59

## 2023-06-23 DIAGNOSIS — I2699 Other pulmonary embolism without acute cor pulmonale: Secondary | ICD-10-CM

## 2023-06-23 DIAGNOSIS — I2602 Saddle embolus of pulmonary artery with acute cor pulmonale: Secondary | ICD-10-CM

## 2023-06-23 LAB — BASIC METABOLIC PANEL
Anion gap: 7 (ref 5–15)
BUN: 12 mg/dL (ref 6–20)
CO2: 23 mmol/L (ref 22–32)
Calcium: 8.5 mg/dL — ABNORMAL LOW (ref 8.9–10.3)
Chloride: 103 mmol/L (ref 98–111)
Creatinine, Ser: 0.78 mg/dL (ref 0.61–1.24)
GFR, Estimated: >60 mL/min (ref >60)
Glucose, Bld: 100 mg/dL — ABNORMAL HIGH (ref 70–99)
Potassium: 4 mmol/L (ref 3.5–5.1)
Sodium: 133 mmol/L — ABNORMAL LOW (ref 135–145)

## 2023-06-23 LAB — ECHOCARDIOGRAM COMPLETE
Area-P 1/2: 3.66 cm2
Calc EF: 60.2 %
Height: 71 in
S' Lateral: 3.2 cm
Single Plane A2C EF: 65.4 %
Single Plane A4C EF: 57.2 %
Weight: 4610.26 [oz_av]

## 2023-06-23 LAB — CBC
HCT: 38.7 % — ABNORMAL LOW (ref 39.0–52.0)
Hemoglobin: 12.8 g/dL — ABNORMAL LOW (ref 13.0–17.0)
MCH: 28.5 pg (ref 26.0–34.0)
MCHC: 33.1 g/dL (ref 30.0–36.0)
MCV: 86.2 fL (ref 80.0–100.0)
Platelets: 101 10*3/uL — ABNORMAL LOW (ref 150–400)
RBC: 4.49 MIL/uL (ref 4.22–5.81)
RDW: 13.7 % (ref 11.5–15.5)
WBC: 6.2 10*3/uL (ref 4.0–10.5)
nRBC: 0 % (ref 0.0–0.2)

## 2023-06-23 LAB — APTT
aPTT: 45 s — ABNORMAL HIGH (ref 24–36)
aPTT: 62 s — ABNORMAL HIGH (ref 24–36)

## 2023-06-23 LAB — PROCALCITONIN: Procalcitonin: <0.1 ng/mL

## 2023-06-23 LAB — HIV ANTIBODY (ROUTINE TESTING W REFLEX): HIV Screen 4th Generation wRfx: REACTIVE — AB

## 2023-06-23 LAB — PHOSPHORUS: Phosphorus: 3.7 mg/dL (ref 2.5–4.6)

## 2023-06-23 LAB — MAGNESIUM: Magnesium: 2.1 mg/dL (ref 1.7–2.4)

## 2023-06-23 MED ORDER — HEPARIN BOLUS VIA INFUSION
2700.0000 [IU] | Freq: Once | INTRAVENOUS | Status: AC
  Administered 2023-06-23: 2700 [IU] via INTRAVENOUS
  Filled 2023-06-23: qty 2700

## 2023-06-23 MED ORDER — SODIUM CHLORIDE 0.9 % IV SOLN
2.0000 g | INTRAVENOUS | Status: DC
  Administered 2023-06-23 – 2023-06-24 (×2): 2 g via INTRAVENOUS
  Filled 2023-06-23 (×2): qty 20

## 2023-06-23 MED ORDER — HEPARIN (PORCINE) 25000 UT/250ML-% IV SOLN
1900.0000 [IU]/h | INTRAVENOUS | Status: DC
  Administered 2023-06-23: 1550 [IU]/h via INTRAVENOUS
  Administered 2023-06-23: 1800 [IU]/h via INTRAVENOUS
  Administered 2023-06-24 – 2023-06-25 (×3): 1900 [IU]/h via INTRAVENOUS
  Filled 2023-06-23 (×5): qty 250

## 2023-06-23 MED ORDER — DEXTROSE 5 % IV SOLN
500.0000 mg | INTRAVENOUS | Status: DC
  Administered 2023-06-23 – 2023-06-24 (×2): 500 mg via INTRAVENOUS
  Filled 2023-06-23 (×2): qty 5

## 2023-06-23 NOTE — Progress Notes (Signed)
Brief progress note: -Admitted earlier today. -As per H&P done earlier today: "Steve Stewart is a 32 y.o. male with medical history significant for testicular cancer, recent diagnosis of left lower extremity DVT, on 06/21/2023 was started on Eliquis, who presents with complaints of worsening shortness of breath and pleuritic pain.  Denies hemoptysis.   In the ED, tachypneic and uncomfortable appearing due to pleuritic pain.  Improved after opiate-based analgesics.     CTA positive for acute pulmonary emboli in the right lower lobe main artery with extension into at least 2 posterior basal segmental arteries, scattered thrombus in downstream subsegmental posterior basilar arteries, some of which does appear occluding within the smaller arteries.  Additional nonocclusive thrombus in the right upper lobe anterior segmental artery with extension into 1 downstream segmental branch.  No evidence of right heart strain on CT scan.  Pneumonia could not be excluded.   The patient was started on heparin drip in the ED.  TRH, hospitalist service, was asked to admit.   ED Course: Temperature 98.1.  BP 129/79, pulse 79, respiratory rate 32.  O2 saturation 91% on room air.  Lab studies notable for WBC 6.2.  Hemoglobin 12.8.  Platelet count 101.  Serum sodium 133, glucose 100".  06/23/2023: Continue heparin drip for now.  Likely discharge on DOAC.

## 2023-06-23 NOTE — Plan of Care (Signed)

## 2023-06-23 NOTE — Progress Notes (Signed)
  Echocardiogram 2D Echocardiogram has been performed.  Janalyn Harder 06/23/2023, 2:57 PM

## 2023-06-23 NOTE — Progress Notes (Signed)
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 Chest CT on 06/22/23 resulted back with acute PE.  Anticoag. changed to heparin drip on admission.  Last dose Eliquis 10 mg 11/1 at 1730    - aPTT collected at 7:36p is slightly sub-therapeutic at 62 seconds - no bleeding documented   Goal of Therapy:  Heparin level 0.3-0.7 units/ml aPTT 66-102 seconds Monitor platelets by anticoagulation protocol: Yes  Plan: - increase heparin drip to 1900 units/hr - check 6 hr aPTT - monitor for s/sx bleeding   Dorna Leitz, PharmD, BCPS 06/23/2023 8:23 PM

## 2023-06-23 NOTE — Progress Notes (Addendum)
PHARMACY - ANTICOAGULATION CONSULT NOTE  Pharmacy Consult for heparin Indication: DVT/PE  No Known Allergies  Patient Measurements:   Heparin Dosing Weight: 91kg  Vital Signs: Temp: 98.6 F (37 C) (11/02 1240) Temp Source: Oral (11/02 1240) BP: 115/85 (11/02 1030) Pulse Rate: 89 (11/02 1030)  Labs: Recent Labs    06/22/23 2030 06/22/23 2237 06/23/23 0500 06/23/23 1245  HGB 14.3  --  12.8*  --   HCT 43.1  --  38.7*  --   PLT 105*  --  101*  --   APTT  --  33  --  45*  HEPARINUNFRC  --  >1.10*  --   --   CREATININE 0.83  --  0.78  --     Estimated Creatinine Clearance: 168.9 mL/min (by C-G formula based on SCr of 0.78 mg/dL).   Medical History: Past Medical History:  Diagnosis Date   Medical history non-contributory      Assessment: 32 yo male with recent diagnosis of testicular cancer and recent new DVT started on Eliquis 10/31 presents with pain all over and some worsening SOB. CTA chest with acute pulmonary emboli in the right lower lobe main artery with extension into at least two posterior basal segmental arteries, scattered thrombus in some of the downstream subsegmental posterior basilar arteries, some of which does appear occluding within the smaller arteries. Additional nonocclusive thrombus in the right upper lobe anterior segmental artery with extension into one downstream subsegmental branch. Overall this is a small clot burden without findings of acute right heart strain. Pharmacy consulted to manage heparin infusion.  Last dose Eliquis 10 mg 11/1 at 1730  Today: -Heparin level elevated s/t recent Eliquis use -aPTT 45 - subtherapeutic on heparin at 1550 units/hr -CBC ok -No complications of therapy noted   Goal of Therapy:  Heparin level 0.3-0.7 units/ml aPTT 66-102 seconds Monitor platelets by anticoagulation protocol: Yes   Plan:  -Heparin bolus 2700 units -Increase heparin infusion to 1800 units/hr -Recheck heparin level 6 hours after rate  increase -Daily CBC  Pricilla Riffle, PharmD, BCPS Clinical Pharmacist 06/23/2023 1:41 PM

## 2023-06-23 NOTE — Progress Notes (Addendum)
The patient's HIV screening test returned positive.  HIV viral load ordered by infectious disease and pending.  If positive, this is a new diagnosis.  Appreciate infectious disease, Dr. Gwyneth Sprout Singh's assistance.    The writer made the patient aware of this abnormal finding and let him know that we are waiting for HIV viral load results and if negative this is a false positive result.  No charge note.

## 2023-06-23 NOTE — H&P (Signed)
History and Physical  Steve Stewart ZOX:[id number] DOB: 24-Sep-1990 DOA: 06/22/2023  Referring physician: Barnie Alderman, PA-EDP  PCP: Patient, No Pcp Per  Outpatient Specialists: Oncology Patient coming from: Home.  Chief Complaint: Shortness of breath, pain when taking a deep breath  HPI: Steve Stewart is a 32 y.o. male with medical history significant for testicular cancer, recent diagnosis of left lower extremity DVT, on 06/21/2023 was started on Eliquis, who presents with complaints of worsening shortness of breath and pleuritic pain.  Denies hemoptysis.  In the ED, tachypneic and uncomfortable appearing due to pleuritic pain.  Improved after opiate-based analgesics.    CTA positive for acute pulmonary emboli in the right lower lobe main artery with extension into at least 2 posterior basal segmental arteries, scattered thrombus in downstream subsegmental posterior basilar arteries, some of which does appear occluding within the smaller arteries.  Additional nonocclusive thrombus in the right upper lobe anterior segmental artery with extension into 1 downstream segmental branch.  No evidence of right heart strain on CT scan.  Pneumonia could not be excluded.  The patient was started on heparin drip in the ED.  TRH, hospitalist service, was asked to admit.  ED Course: Temperature 98.1.  BP 129/79, pulse 79, respiratory rate 32.  O2 saturation 91% on room air.  Lab studies notable for WBC 6.2.  Hemoglobin 12.8.  Platelet count 101.  Serum sodium 133, glucose 100.  Review of Systems: Review of systems as noted in the HPI. All other systems reviewed and are negative.   Past Medical History:  Diagnosis Date   Medical history non-contributory    Past Surgical History:  Procedure Laterality Date   IR IMAGING GUIDED PORT INSERTION  06/18/2023   NO PAST SURGERIES     ORCHIECTOMY Left 05/02/2023   Procedure: LEFT INGUINAL ORCHIECTOMY;  Surgeon: Rene Paci, MD;  Location: [facility];   Service: Urology;  Laterality: Left;    Social History:  reports that he has been smoking cigarettes. He has never used smokeless tobacco. He reports that he does not drink alcohol and does not use drugs.   No Known Allergies  Family history: None reported  Prior to Admission medications   Medication Sig Start Date End Date Taking? Authorizing Provider  acetaminophen (TYLENOL) 650 MG CR tablet Take 1,300 mg by mouth every 8 (eight) hours as needed for pain.   Yes [provider]  albuterol (VENTOLIN HFA) 108 (90 Base) MCG/ACT inhaler Inhale 1 puff into the lungs every 6 (six) hours as needed for wheezing or shortness of breath. 10/02/22  Yes [provider]  APIXABAN Everlene Balls) VTE STARTER PACK (10MG  AND 5MG ) Take as directed on package: start with two-5mg  tablets twice daily for 7 days. On day 8, switch to one-5mg  tablet twice daily. 06/21/23  Yes [name], MD  nicotine (NICODERM CQ - DOSED IN MG/24 HR) 7 mg/24hr patch Place 1 patch (7 mg total) onto the skin daily for 28 days. 05/30/23 06/27/23 Yes [name], MD  dexamethasone (DECADRON) 4 MG tablet Take 2 tablets (8 mg total) by mouth daily. Start the day after last cisplatin dose on day 6 of chemo x 3 days.Take with food. Patient not taking: Reported on 06/21/2023 06/24/23   [name], MD  lidocaine-prilocaine (EMLA) cream Apply to affected area once Patient not taking: Reported on 06/21/2023 06/24/23   [name], MD  ondansetron (ZOFRAN) 8 MG tablet Take 1 tablet (8 mg total) by mouth every 8 (eight)  hours as needed for nausea or vomiting. Start on the third day after last cisplatin dose on day 8 of chemo Patient not taking: Reported on 06/21/2023 06/24/23   [name], MD  oxyCODONE-acetaminophen (PERCOCET) 5-325 MG tablet Take 1 tablet by mouth every 4 (four) hours as needed for severe pain. Patient not taking: Reported on 06/21/2023 05/02/23   Rene Paci, MD  prochlorperazine  (COMPAZINE) 10 MG tablet Take 1 tablet (10 mg total) by mouth every 6 (six) hours as needed for nausea or vomiting. Patient not taking: Reported on 06/21/2023 06/24/23   [name], MD    Physical Exam: BP 127/83   Pulse 85   Temp 98.8 F (37.1 C) (Oral)   Resp (!) 26   SpO2 97%   General: 32 y.o. year-old male well developed well nourished in no acute distress.  Alert and oriented x3. Cardiovascular: Regular rate and rhythm with no rubs or gallops.  No thyromegaly or JVD noted.  Unilateral left lower extremity edema. Respiratory: Clear to auscultation with no wheezes or rales. Good inspiratory effort. Abdomen: Soft nontender nondistended with normal bowel sounds x4 quadrants. Muskuloskeletal: No cyanosis or clubbing noted bilaterally Neuro: CN II-XII intact, strength, sensation, reflexes Skin: No ulcerative lesions noted or rashes Psychiatry: Judgement and insight appear normal. Mood is appropriate for condition and setting          Labs on Admission:  Basic Metabolic Panel: Recent Labs  Lab 06/20/23 0846 06/22/23 2030  NA 137 127*  K 4.1 4.7  CL 104 95*  CO2 26 22  GLUCOSE 88 99  BUN 16 14  CREATININE 1.06 0.83  CALCIUM 9.1 9.2   Liver Function Tests: Recent Labs  Lab 06/20/23 0846 06/22/23 2030  AST 17 25  ALT 26 27  ALKPHOS 64 62  BILITOT 0.6 1.3*  PROT 7.7 7.8  ALBUMIN 4.2 3.9   No results for input(s): "LIPASE", "AMYLASE" in the last 168 hours. No results for input(s): "AMMONIA" in the last 168 hours. CBC: Recent Labs  Lab 06/20/23 0846 06/22/23 2030  WBC 6.1 8.0  NEUTROABS 3.2 4.5  HGB 14.9 14.3  HCT 44.7 43.1  MCV 85.3 86.2  PLT 97* 105*   Cardiac Enzymes: No results for input(s): "CKTOTAL", "CKMB", "CKMBINDEX", "TROPONINI" in the last 168 hours.  BNP (last 3 results) No results for input(s): "BNP" in the last 8760 hours.  ProBNP (last 3 results) No results for input(s): "PROBNP" in the last 8760 hours.  CBG: No results for  input(s): "GLUCAP" in the last 168 hours.  Radiological Exams on Admission: CT Angio Chest PE W and/or Wo Contrast  Result Date: 06/22/2023 CLINICAL DATA:  Recent diagnosis left testicular cancer with orchiectomy 05/02/2023, imaging guided right chest port insertion 06/18/2023, presents with dyspnea and chest wall pain with suspected DVT. EXAM: CT ANGIOGRAPHY CHEST WITH CONTRAST TECHNIQUE: Multidetector CT imaging of the chest was performed using the standard protocol during bolus administration of intravenous contrast. Multiplanar CT image reconstructions and MIPs were obtained to evaluate the vascular anatomy. RADIATION DOSE REDUCTION: This exam was performed according to the departmental dose-optimization program which includes automated exposure control, adjustment of the mA and/or kV according to patient size and/or use of iterative reconstruction technique. CONTRAST:  75mL OMNIPAQUE IOHEXOL 350 MG/ML SOLN COMPARISON:  Chest, abdomen pelvis CT with IV contrast 04/28/2023, MRI abdomen earlier today. FINDINGS: Cardiovascular: Interval new insertion of a right chest port with IJ approach catheter terminating at about the superior cavoatrial junction.  The cardiac size is upper limits normal. There is no pericardial effusion. No visible coronary calcifications. The aorta, great vessels, and pulmonary veins are normal. The pulmonary arteries are within normal caliber limits. There is hypodense acute thrombus, which grossly appears nonoccluding, in the right lower lobe main artery with extension into at least 2 posterior basal segmental arteries and with scattered thrombus in some of the downstream subsegmental posterior basilar arteries, some of which does appear occluding within the smaller arteries. In the right upper lobe, additional nonocclusive thrombus is seen in the anterior segmental artery with extension into 1 downstream subsegmental branch. No other arterial embolic disease is seen. Overall this is a  small clot burden without findings of right heart strain. Mediastinum/Nodes: Mildly enlarged bilateral hilar lymph nodes are again noted, largest on the right is 1.5 cm short axis on 4:57, unchanged. Largest on the left 1.3 cm short axis on 4:67, stable. There are shotty prevascular space nodes which were also noted previously. Largest of these is 8 mm in short axis on 4:53, with a subcarinal nodal complex measuring 1.5 cm short axis, slightly larger than previously. There is no other mediastinal adenopathy. No thyroid mass. Bilateral axillary adenopathy is redemonstrated. Again the largest right axillary node is 2.3 x 2.7 cm on 4:45, and the largest on the left 3.2 x 1.8 cm on 4:55, all unchanged. The thoracic trachea, main bronchi and thoracic esophagus are unremarkable. Lungs/Pleura: There is patchy consolidation in the posterior basal right lower lobe which is probably related to 1 or more evolving infarctions, interspersed with ground-glass disease. Underlying pneumonia is possible in the proper clinical setting. There is increased atelectasis in the posterior left lower lobe superior segment and in the medial segment of the right middle lobe. The lungs are otherwise clear. There is no pleural effusion, thickening or pneumothorax. Upper Abdomen: Fatty liver with mild hepatosplenomegaly, splenic coronal diameter 16 cm, previously also 16 cm, with again noted 2.1 cm stone in the distal gallbladder. No acute upper abdominal abnormality. Stable bilateral adrenal adenomas. Mild elevation right hemidiaphragm. Musculoskeletal: No chest wall abnormality. No acute or significant osseous findings. Review of the MIP images confirms the above findings. IMPRESSION: 1. Acute pulmonary emboli in the right lower lobe main artery with extension into at least 2 posterior basal segmental arteries, scattered thrombus in some of the downstream subsegmental posterior basilar arteries, some of which does appear occluding within the  smaller arteries. 2. Additional nonocclusive thrombus in the right upper lobe anterior segmental artery with extension into 1 downstream subsegmental branch. 3. Overall this is a small clot burden without findings of acute right heart strain. 4. Patchy consolidation in the posterior basal right lower lobe which is probably related to 1 or more evolving infarctions, interspersed with ground-glass disease. Underlying pneumonia is possible in the proper clinical setting. 5. Stable bilateral axillary adenopathy. Stable mild bihilar adenopathy. 6. Stable fatty liver with mild hepatosplenomegaly. Cholelithiasis. Stable bilateral adrenal adenomas. 7. Critical Value/emergent results were called by telephone at the time of interpretation on 06/22/2023 at 10:06 pm to provider ABIGAIL HARRIS , who verbally acknowledged these results. Electronically Signed   By: Almira Bar M.D.   On: 06/22/2023 22:18   MR Abdomen W Wo Contrast  Result Date: 06/22/2023 CLINICAL DATA:  Characterize right adrenal mass, new diagnosis testicular cancer EXAM: MRI ABDOMEN WITHOUT AND WITH CONTRAST TECHNIQUE: Multiplanar multisequence MR imaging of the abdomen was performed both before and after the administration of intravenous contrast. CONTRAST:  10mL GADAVIST  GADOBUTROL 1 MMOL/ML IV SOLN COMPARISON:  CT chest abdomen pelvis, 04/28/2023 FINDINGS: Lower chest: No acute abnormality. Hepatobiliary: No solid liver abnormality is seen. Hepatomegaly, maximum coronal span 20.0 cm. Gallstone. No gallbladder wall thickening, or biliary dilatation. Pancreas: Unremarkable. No pancreatic ductal dilatation or surrounding inflammatory changes. Spleen: Splenomegaly, maximum span 16.0 cm. Adrenals/Urinary Tract: Definitively benign, macroscopic fat containing bilateral adrenal adenomata (series 6, image 38). Kidneys are normal, without renal calculi, solid lesion, or hydronephrosis. Stomach/Bowel: Stomach is within normal limits. No evidence of bowel wall  thickening, distention, or inflammatory changes. Vascular/Lymphatic: No significant vascular findings are present. No enlarged abdominal lymph nodes. Other: No abdominal wall hernia or abnormality. No ascites. Musculoskeletal: No acute or significant osseous findings. IMPRESSION: 1. Definitively benign, macroscopic fat containing bilateral adrenal adenomata, for which no specific further follow-up or characterization is required. 2. No evidence of lymphadenopathy or metastatic disease in the abdomen. 3. Cholelithiasis. 4. Hepatosplenomegaly. Electronically Signed   By: Jearld Lesch M.D.   On: 06/22/2023 17:21   VAS [place] LOWER EXTREMITY VENOUS (DVT)  Result Date: 06/21/2023  Lower Venous DVT Study Patient Name:  Steve Stewart  Date of Exam:   06/21/2023 Medical Rec #: [id number]     Accession #:    [id number] Date of Birth: 08-Mar-1991     Patient Gender: M Patient Age:   64 years Exam Location:  [facility] Procedure:      VAS [place] LOWER EXTREMITY VENOUS (DVT) Referring Phys: RUBENS CHANG --------------------------------------------------------------------------------  Indications: Pain.  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 Limitations: Patient tension. Comparison Study: No prior study Performing Technologist: Shona Simpson  Examination Guidelines: A complete evaluation includes B-mode imaging, spectral Doppler, color Doppler, and power Doppler as needed of all accessible portions of each vessel. Bilateral testing is considered an integral part of a complete examination. Limited examinations for reoccurring indications may be performed as noted. The reflux portion of the exam is performed with the patient in reverse Trendelenburg.  +-----+---------------+---------+-----------+----------+---------------+ RIGHTCompressibilityPhasicitySpontaneityPropertiesThrombus Aging  +-----+---------------+---------+-----------+----------+---------------+ CFV                 Yes      Yes                  Patent by  color +-----+---------------+---------+-----------+----------+---------------+   +---------+---------------+---------+-----------+----------+---------------+ LEFT     CompressibilityPhasicitySpontaneityPropertiesThrombus Aging  +---------+---------------+---------+-----------+----------+---------------+ CFV      Full           Yes      Yes                                  +---------+---------------+---------+-----------+----------+---------------+ SFJ      Full                                                         +---------+---------------+---------+-----------+----------+---------------+ FV Prox  Full                                                         +---------+---------------+---------+-----------+----------+---------------+ FV Mid   Full                                                         +---------+---------------+---------+-----------+----------+---------------+  FV DistalFull                                                         +---------+---------------+---------+-----------+----------+---------------+ PFV                                                   Patent by color +---------+---------------+---------+-----------+----------+---------------+ POP      None           No       No                                   +---------+---------------+---------+-----------+----------+---------------+ PTV      None                                                         +---------+---------------+---------+-----------+----------+---------------+ PERO     None                                                         +---------+---------------+---------+-----------+----------+---------------+ Soleal   Full                                                         +---------+---------------+---------+-----------+----------+---------------+ Gastroc  Full                                                          +---------+---------------+---------+-----------+----------+---------------+     Summary: RIGHT: - No evidence of common femoral vein obstruction.   LEFT: - Findings consistent with acute deep vein thrombosis involving the left popliteal vein, left posterior tibial veins, and left peroneal veins.  - No cystic structure found in the popliteal fossa.  *See table(s) above for measurements and observations. Electronically signed by Heath Lark on 06/21/2023 at 4:18:20 PM.    Final     EKG: I independently viewed the EKG done and my findings are as followed: Sinus rhythm rate of 93.  Nonspecific ST-T changes.  QTc 418.  Assessment/Plan Present on Admission:  Acute pulmonary embolism (HCC)  Principal Problem:   Acute pulmonary embolism (HCC)  Acute pulmonary embolism in the setting of testicular cancer, no right heart strain per CT scan Recent diagnosis of left lower extremity DVT, on Eliquis since 06/21/2023 Continue heparin drip x 48 hours Follow 2D echo Home O2 evaluation with ambulation  Possible pneumonia seen on CT scan Cannot be excluded Immunocompromised Rocephin and azithromycin Follow procalcitonin level  Recent diagnosis of testicular cancer Will follow-up with oncology outpatient  Mild hypovolemic hyponatremia Serum sodium 133 Gentle IV fluid hydration NS at 50 cc/h x 1 day Monitor electrolytes   Time: 75 minutes   DVT prophylaxis: Heparin drip  Code Status: Full code  Family Communication: Updated the patient's mother at bedside.  Disposition Plan: Admitted to telemetry unit  Consults called: None.  Admission status: Inpatient status.   Status is: Inpatient The patient requires at least 2 midnights for further evaluation and treatment of present condition.   Darlin Drop MD [facility] Pager [phone]  If 7PM-7AM, please contact night-coverage [email or web address] Password [facility]  06/23/2023, 12:36 AM

## 2023-06-23 NOTE — ED Notes (Signed)
ED TO INPATIENT HANDOFF REPORT  Name/Age/Gender Steve Stewart 32 y.o. male  Code Status    Code Status Orders  (From admission, onward)           Start     Ordered   06/22/23 2241  Full code  Continuous       Question:  By:  Answer:  Consent: discussion documented in EHR   06/22/23 2240           Code Status History     Date Active Date Inactive Code Status Order ID Comments User Context   06/18/2023 1626 06/19/2023 0512 Full Code [id number]  Irish Lack, MD [facility]       Home/SNF/Other Home  Chief Complaint Acute pulmonary embolism (HCC) [I26.99]  Level of Care/Admitting Diagnosis ED Disposition     ED Disposition  Admit   Condition  --   Comment  Hospital Area: [facility] [[id number]]  Level of Care: Telemetry [5]  Admit to tele based on following criteria: Monitor for Ischemic changes  May admit patient to [facility] or [facility] if equivalent level of care is available:: Yes  Covid Evaluation: Asymptomatic - no recent exposure (last 10 days) testing not required  Diagnosis: Acute pulmonary embolism [facility]) [[id number]]  Admitting Physician: Darlin Drop [[id number]]  Attending Physician: Darlin Drop [[id number]]  Certification:: I certify this patient will need inpatient services for at least 2 midnights  Expected Medical Readiness: 06/24/2023          Medical History Past Medical History:  Diagnosis Date   Medical history non-contributory     Allergies No Known Allergies  IV Location/Drains/Wounds Patient Lines/Drains/Airways Status     Active Line/Drains/Airways     Name Placement date Placement time Site Days   Implanted Port 06/18/23 Right Chest 06/18/23  1547  Chest  5   Peripheral IV 06/22/23 20 G Left Antecubital 06/22/23  2034  Antecubital  1   Peripheral IV 06/23/23 22 G 1" Posterior;Right Forearm 06/23/23  0000  Forearm  less than 1            Labs/Imaging Results for orders placed or performed during the  hospital encounter of 06/22/23 (from the past 48 hour(s))  Urinalysis, Routine w reflex microscopic -Urine, Clean Catch     Status: None   Collection Time: 06/22/23  8:26 PM  Result Value Ref Range   Color, Urine YELLOW YELLOW   APPearance CLEAR CLEAR   Specific Gravity, Urine 1.025 1.005 - 1.030   pH 6.0 5.0 - 8.0   Glucose, UA NEGATIVE NEGATIVE mg/dL   Hgb urine dipstick NEGATIVE NEGATIVE   Bilirubin Urine NEGATIVE NEGATIVE   Ketones, ur NEGATIVE NEGATIVE mg/dL   Protein, ur NEGATIVE NEGATIVE mg/dL   Nitrite NEGATIVE NEGATIVE   Leukocytes,Ua NEGATIVE NEGATIVE    Comment: Performed at [facility], [address], [place], [place] [address]  Comprehensive metabolic panel     Status: Abnormal   Collection Time: 06/22/23  8:30 PM  Result Value Ref Range   Sodium 127 (L) 135 - 145 mmol/L   Potassium 4.7 3.5 - 5.1 mmol/L    Comment: HEMOLYSIS AT THIS LEVEL MAY AFFECT RESULT   Chloride 95 (L) 98 - 111 mmol/L   CO2 22 22 - 32 mmol/L   Glucose, Bld 99 70 - 99 mg/dL    Comment: Glucose reference range applies only to samples taken after fasting for at least 8 hours.   BUN 14 6 -  20 mg/dL   Creatinine, Ser 1.82 0.61 - 1.24 mg/dL   Calcium 9.2 8.9 - 99.3 mg/dL   Total Protein 7.8 6.5 - 8.1 g/dL   Albumin 3.9 3.5 - 5.0 g/dL   AST 25 15 - 41 U/L    Comment: HEMOLYSIS AT THIS LEVEL MAY AFFECT RESULT   ALT 27 0 - 44 U/L    Comment: HEMOLYSIS AT THIS LEVEL MAY AFFECT RESULT   Alkaline Phosphatase 62 38 - 126 U/L   Total Bilirubin 1.3 (H) 0.3 - 1.2 mg/dL    Comment: HEMOLYSIS AT THIS LEVEL MAY AFFECT RESULT   GFR, Estimated >60 >60 mL/min    Comment: (NOTE) Calculated using the CKD-EPI Creatinine Equation (2021)    Anion gap 10 5 - 15    Comment: Performed at [facility], [address], [place], [place] [address]  CBC with Differential     Status: Abnormal   Collection Time: 06/22/23  8:30 PM  Result Value Ref Range   WBC 8.0 4.0 - 10.5 K/uL    RBC 5.00 4.22 - 5.81 MIL/uL   Hemoglobin 14.3 13.0 - 17.0 g/dL   HCT 78.9 38.1 - 01.7 %   MCV 86.2 80.0 - 100.0 fL   MCH 28.6 26.0 - 34.0 pg   MCHC 33.2 30.0 - 36.0 g/dL   RDW 51.0 25.8 - 52.7 %   Platelets 105 (L) 150 - 400 K/uL   nRBC 0.0 0.0 - 0.2 %   Neutrophils Relative % 57 %   Neutro Abs 4.5 1.7 - 7.7 K/uL   Lymphocytes Relative 33 %   Lymphs Abs 2.6 0.7 - 4.0 K/uL   Monocytes Relative 9 %   Monocytes Absolute 0.7 0.1 - 1.0 K/uL   Eosinophils Relative 1 %   Eosinophils Absolute 0.1 0.0 - 0.5 K/uL   Basophils Relative 0 %   Basophils Absolute 0.0 0.0 - 0.1 K/uL   Immature Granulocytes 0 %   Abs Immature Granulocytes 0.01 0.00 - 0.07 K/uL    Comment: Performed at [facility], [address], [place], [place] [address]  Resp panel by RT-PCR (RSV, Flu A&B, Covid) Anterior Nasal Swab     Status: None   Collection Time: 06/22/23  9:05 PM   Specimen: Anterior Nasal Swab  Result Value Ref Range   SARS Coronavirus 2 by RT PCR NEGATIVE NEGATIVE    Comment: (NOTE) SARS-CoV-2 target nucleic acids are NOT DETECTED.  The SARS-CoV-2 RNA is generally detectable in upper respiratory specimens during the acute phase of infection. The lowest concentration of SARS-CoV-2 viral copies this assay can detect is 138 copies/mL. A negative result does not preclude SARS-Cov-2 infection and should not be used as the sole basis for treatment or other patient management decisions. A negative result may occur with  improper specimen collection/handling, submission of specimen other than nasopharyngeal swab, presence of viral mutation(s) within the areas targeted by this assay, and inadequate number of viral copies(<138 copies/mL). A negative result must be combined with clinical observations, patient history, and epidemiological information. The expected result is Negative.  Fact Sheet for Patients:  BloggerCourse.com  Fact Sheet for Healthcare  Providers:  SeriousBroker.it  This test is no t yet approved or cleared by the [place] FDA and  has been authorized for detection and/or diagnosis of SARS-CoV-2 by FDA under an Emergency Use Authorization (EUA). This EUA will remain  in effect (meaning this test can be used) for the duration of the COVID-19  declaration under Section 564(b)(1) of the Act, 21 U.S.C.section 360bbb-3(b)(1), unless the authorization is terminated  or revoked sooner.       Influenza A by PCR NEGATIVE NEGATIVE   Influenza B by PCR NEGATIVE NEGATIVE    Comment: (NOTE) The Xpert Xpress SARS-CoV-2/FLU/RSV plus assay is intended as an aid in the diagnosis of influenza from Nasopharyngeal swab specimens and should not be used as a sole basis for treatment. Nasal washings and aspirates are unacceptable for Xpert Xpress SARS-CoV-2/FLU/RSV testing.  Fact Sheet for Patients: BloggerCourse.com  Fact Sheet for Healthcare Providers: SeriousBroker.it  This test is not yet approved or cleared by the [place] FDA and has been authorized for detection and/or diagnosis of SARS-CoV-2 by FDA under an Emergency Use Authorization (EUA). This EUA will remain in effect (meaning this test can be used) for the duration of the COVID-19 declaration under Section 564(b)(1) of the Act, 21 U.S.C. section 360bbb-3(b)(1), unless the authorization is terminated or revoked.     Resp Syncytial Virus by PCR NEGATIVE NEGATIVE    Comment: (NOTE) Fact Sheet for Patients: BloggerCourse.com  Fact Sheet for Healthcare Providers: SeriousBroker.it  This test is not yet approved or cleared by the [place] FDA and has been authorized for detection and/or diagnosis of SARS-CoV-2 by FDA under an Emergency Use Authorization (EUA). This EUA will remain in effect (meaning this test can be used) for the  duration of the COVID-19 declaration under Section 564(b)(1) of the Act, 21 U.S.C. section 360bbb-3(b)(1), unless the authorization is terminated or revoked.  Performed at [facility], [address], [place], [place] [address]   APTT     Status: None   Collection Time: 06/22/23 10:37 PM  Result Value Ref Range   aPTT 33 24 - 36 seconds    Comment: Performed at [facility], [address] [place], [place] [address]  Heparin level (unfractionated)     Status: Abnormal   Collection Time: 06/22/23 10:37 PM  Result Value Ref Range   Heparin Unfractionated >1.10 (H) 0.30 - 0.70 IU/mL    Comment: (NOTE) The clinical reportable range upper limit is being lowered to >1.10 to align with the FDA approved guidance for the current laboratory assay.  If heparin results are below expected values, and patient dosage has  been confirmed, suggest follow up testing of antithrombin III levels. Performed at [facility], [address] [place], [place] [address]   HIV Antibody (routine testing w rflx)     Status: Abnormal   Collection Time: 06/23/23 12:37 AM  Result Value Ref Range   HIV Screen 4th Generation wRfx Reactive (A) Non Reactive    Comment: REPEATED TO VERIFY (NOTE) Reactive result does not distinguish HIV-1 p24 antigen, HIV-1  antibody, HIV-2 antibody, and HIV-1 group O antibody. Results  reactive by HIV Antigen/Antibody EIA must be confirmed. Sent for  confirmation. Performed at [facility], [address] [place], [place] [address]   CBC     Status: Abnormal   Collection Time: 06/23/23  5:00 AM  Result Value Ref Range   WBC 6.2 4.0 - 10.5 K/uL   RBC 4.49 4.22 - 5.81 MIL/uL   Hemoglobin 12.8 (L) 13.0 - 17.0 g/dL   HCT 29.5 (L) 62.1 - 30.8 %   MCV 86.2 80.0 - 100.0 fL   MCH 28.5 26.0 - 34.0 pg   MCHC 33.1 30.0 - 36.0 g/dL   RDW 65.7 84.6 - 96.2 %  Platelets 101 (L) 150 - 400 K/uL    Comment: Immature  Platelet Fraction may be clinically indicated, consider ordering this additional test [id number]    nRBC 0.0 0.0 - 0.2 %    Comment: Performed at [facility], [address] [place], [place] [address]  Basic metabolic panel     Status: Abnormal   Collection Time: 06/23/23  5:00 AM  Result Value Ref Range   Sodium 133 (L) 135 - 145 mmol/L   Potassium 4.0 3.5 - 5.1 mmol/L   Chloride 103 98 - 111 mmol/L   CO2 23 22 - 32 mmol/L   Glucose, Bld 100 (H) 70 - 99 mg/dL    Comment: Glucose reference range applies only to samples taken after fasting for at least 8 hours.   BUN 12 6 - 20 mg/dL   Creatinine, Ser 4.01 0.61 - 1.24 mg/dL   Calcium 8.5 (L) 8.9 - 10.3 mg/dL   GFR, Estimated >02 >72 mL/min    Comment: (NOTE) Calculated using the CKD-EPI Creatinine Equation (2021)    Anion gap 7 5 - 15    Comment: Performed at [facility], [address], [place], [place] [address]  Magnesium     Status: None   Collection Time: 06/23/23  5:00 AM  Result Value Ref Range   Magnesium 2.1 1.7 - 2.4 mg/dL    Comment: Performed at [facility], [address], [place], [place] [address]  Phosphorus     Status: None   Collection Time: 06/23/23  5:00 AM  Result Value Ref Range   Phosphorus 3.7 2.5 - 4.6 mg/dL    Comment: Performed at [facility], [address] [place], [place] [address]  Procalcitonin     Status: None   Collection Time: 06/23/23  7:00 AM  Result Value Ref Range   Procalcitonin <0.10 ng/mL    Comment:        Interpretation: PCT (Procalcitonin) <= 0.5 ng/mL: Systemic infection (sepsis) is not likely. Local bacterial infection is possible. (NOTE)       Sepsis PCT Algorithm           Lower Respiratory Tract                                      Infection PCT Algorithm    ----------------------------     ----------------------------         PCT < 0.25 ng/mL                PCT < 0.10 ng/mL           Strongly encourage             Strongly discourage   discontinuation of antibiotics    initiation of antibiotics    ----------------------------     -----------------------------       PCT 0.25 - 0.50 ng/mL            PCT 0.10 - 0.25 ng/mL               OR       >80% decrease in PCT            Discourage initiation of  antibiotics      Encourage discontinuation           of antibiotics    ----------------------------     -----------------------------         PCT >= 0.50 ng/mL              PCT 0.26 - 0.50 ng/mL               AND        <80% decrease in PCT             Encourage initiation of                                             antibiotics       Encourage continuation           of antibiotics    ----------------------------     -----------------------------        PCT >= 0.50 ng/mL                  PCT > 0.50 ng/mL               AND         increase in PCT                  Strongly encourage                                      initiation of antibiotics    Strongly encourage escalation           of antibiotics                                     -----------------------------                                           PCT <= 0.25 ng/mL                                                 OR                                        > 80% decrease in PCT                                      Discontinue / Do not initiate                                             antibiotics  Performed at [facility], [address], [place], [place] [address]    CT Angio Chest PE W and/or Wo Contrast  Result Date:  06/22/2023 CLINICAL DATA:  Recent diagnosis left testicular cancer with orchiectomy 05/02/2023, imaging guided right chest port insertion 06/18/2023, presents with dyspnea and chest wall pain with suspected DVT. EXAM: CT ANGIOGRAPHY CHEST WITH CONTRAST TECHNIQUE: Multidetector CT imaging of the chest was performed using the  standard protocol during bolus administration of intravenous contrast. Multiplanar CT image reconstructions and MIPs were obtained to evaluate the vascular anatomy. RADIATION DOSE REDUCTION: This exam was performed according to the departmental dose-optimization program which includes automated exposure control, adjustment of the mA and/or kV according to patient size and/or use of iterative reconstruction technique. CONTRAST:  75mL OMNIPAQUE IOHEXOL 350 MG/ML SOLN COMPARISON:  Chest, abdomen pelvis CT with IV contrast 04/28/2023, MRI abdomen earlier today. FINDINGS: Cardiovascular: Interval new insertion of a right chest port with IJ approach catheter terminating at about the superior cavoatrial junction. The cardiac size is upper limits normal. There is no pericardial effusion. No visible coronary calcifications. The aorta, great vessels, and pulmonary veins are normal. The pulmonary arteries are within normal caliber limits. There is hypodense acute thrombus, which grossly appears nonoccluding, in the right lower lobe main artery with extension into at least 2 posterior basal segmental arteries and with scattered thrombus in some of the downstream subsegmental posterior basilar arteries, some of which does appear occluding within the smaller arteries. In the right upper lobe, additional nonocclusive thrombus is seen in the anterior segmental artery with extension into 1 downstream subsegmental branch. No other arterial embolic disease is seen. Overall this is a small clot burden without findings of right heart strain. Mediastinum/Nodes: Mildly enlarged bilateral hilar lymph nodes are again noted, largest on the right is 1.5 cm short axis on 4:57, unchanged. Largest on the left 1.3 cm short axis on 4:67, stable. There are shotty prevascular space nodes which were also noted previously. Largest of these is 8 mm in short axis on 4:53, with a subcarinal nodal complex measuring 1.5 cm short axis, slightly larger than  previously. There is no other mediastinal adenopathy. No thyroid mass. Bilateral axillary adenopathy is redemonstrated. Again the largest right axillary node is 2.3 x 2.7 cm on 4:45, and the largest on the left 3.2 x 1.8 cm on 4:55, all unchanged. The thoracic trachea, main bronchi and thoracic esophagus are unremarkable. Lungs/Pleura: There is patchy consolidation in the posterior basal right lower lobe which is probably related to 1 or more evolving infarctions, interspersed with ground-glass disease. Underlying pneumonia is possible in the proper clinical setting. There is increased atelectasis in the posterior left lower lobe superior segment and in the medial segment of the right middle lobe. The lungs are otherwise clear. There is no pleural effusion, thickening or pneumothorax. Upper Abdomen: Fatty liver with mild hepatosplenomegaly, splenic coronal diameter 16 cm, previously also 16 cm, with again noted 2.1 cm stone in the distal gallbladder. No acute upper abdominal abnormality. Stable bilateral adrenal adenomas. Mild elevation right hemidiaphragm. Musculoskeletal: No chest wall abnormality. No acute or significant osseous findings. Review of the MIP images confirms the above findings. IMPRESSION: 1. Acute pulmonary emboli in the right lower lobe main artery with extension into at least 2 posterior basal segmental arteries, scattered thrombus in some of the downstream subsegmental posterior basilar arteries, some of which does appear occluding within the smaller arteries. 2. Additional nonocclusive thrombus in the right upper lobe anterior segmental artery with extension into 1 downstream subsegmental branch. 3. Overall this is a small clot burden without findings of acute right heart strain. 4. Patchy consolidation in  the posterior basal right lower lobe which is probably related to 1 or more evolving infarctions, interspersed with ground-glass disease. Underlying pneumonia is possible in the proper  clinical setting. 5. Stable bilateral axillary adenopathy. Stable mild bihilar adenopathy. 6. Stable fatty liver with mild hepatosplenomegaly. Cholelithiasis. Stable bilateral adrenal adenomas. 7. Critical Value/emergent results were called by telephone at the time of interpretation on 06/22/2023 at 10:06 pm to provider ABIGAIL HARRIS , who verbally acknowledged these results. Electronically Signed   By: Almira Bar M.D.   On: 06/22/2023 22:18   MR Abdomen W Wo Contrast  Result Date: 06/22/2023 CLINICAL DATA:  Characterize right adrenal mass, new diagnosis testicular cancer EXAM: MRI ABDOMEN WITHOUT AND WITH CONTRAST TECHNIQUE: Multiplanar multisequence MR imaging of the abdomen was performed both before and after the administration of intravenous contrast. CONTRAST:  10mL GADAVIST GADOBUTROL 1 MMOL/ML IV SOLN COMPARISON:  CT chest abdomen pelvis, 04/28/2023 FINDINGS: Lower chest: No acute abnormality. Hepatobiliary: No solid liver abnormality is seen. Hepatomegaly, maximum coronal span 20.0 cm. Gallstone. No gallbladder wall thickening, or biliary dilatation. Pancreas: Unremarkable. No pancreatic ductal dilatation or surrounding inflammatory changes. Spleen: Splenomegaly, maximum span 16.0 cm. Adrenals/Urinary Tract: Definitively benign, macroscopic fat containing bilateral adrenal adenomata (series 6, image 38). Kidneys are normal, without renal calculi, solid lesion, or hydronephrosis. Stomach/Bowel: Stomach is within normal limits. No evidence of bowel wall thickening, distention, or inflammatory changes. Vascular/Lymphatic: No significant vascular findings are present. No enlarged abdominal lymph nodes. Other: No abdominal wall hernia or abnormality. No ascites. Musculoskeletal: No acute or significant osseous findings. IMPRESSION: 1. Definitively benign, macroscopic fat containing bilateral adrenal adenomata, for which no specific further follow-up or characterization is required. 2. No evidence of  lymphadenopathy or metastatic disease in the abdomen. 3. Cholelithiasis. 4. Hepatosplenomegaly. Electronically Signed   By: Jearld Lesch M.D.   On: 06/22/2023 17:21   VAS [place] LOWER EXTREMITY VENOUS (DVT)  Result Date: 06/21/2023  Lower Venous DVT Study Patient Name:  Steve Stewart  Date of Exam:   06/21/2023 Medical Rec #: [id number]     Accession #:    [id number] Date of Birth: 08-28-1990     Patient Gender: M Patient Age:   68 years Exam Location:  [facility] Procedure:      VAS [place] LOWER EXTREMITY VENOUS (DVT) Referring Phys: RUBENS CHANG --------------------------------------------------------------------------------  Indications: Pain.  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 Limitations: Patient tension. Comparison Study: No prior study Performing Technologist: Shona Simpson  Examination Guidelines: A complete evaluation includes B-mode imaging, spectral Doppler, color Doppler, and power Doppler as needed of all accessible portions of each vessel. Bilateral testing is considered an integral part of a complete examination. Limited examinations for reoccurring indications may be performed as noted. The reflux portion of the exam is performed with the patient in reverse Trendelenburg.  +-----+---------------+---------+-----------+----------+---------------+ RIGHTCompressibilityPhasicitySpontaneityPropertiesThrombus Aging  +-----+---------------+---------+-----------+----------+---------------+ CFV                 Yes      Yes                  Patent by color +-----+---------------+---------+-----------+----------+---------------+   +---------+---------------+---------+-----------+----------+---------------+ LEFT     CompressibilityPhasicitySpontaneityPropertiesThrombus Aging  +---------+---------------+---------+-----------+----------+---------------+ CFV      Full           Yes      Yes                                   +---------+---------------+---------+-----------+----------+---------------+  SFJ      Full                                                         +---------+---------------+---------+-----------+----------+---------------+ FV Prox  Full                                                         +---------+---------------+---------+-----------+----------+---------------+ FV Mid   Full                                                         +---------+---------------+---------+-----------+----------+---------------+ FV DistalFull                                                         +---------+---------------+---------+-----------+----------+---------------+ PFV                                                   Patent by color +---------+---------------+---------+-----------+----------+---------------+ POP      None           No       No                                   +---------+---------------+---------+-----------+----------+---------------+ PTV      None                                                         +---------+---------------+---------+-----------+----------+---------------+ PERO     None                                                         +---------+---------------+---------+-----------+----------+---------------+ Soleal   Full                                                         +---------+---------------+---------+-----------+----------+---------------+ Gastroc  Full                                                         +---------+---------------+---------+-----------+----------+---------------+  Summary: RIGHT: - No evidence of common femoral vein obstruction.   LEFT: - Findings consistent with acute deep vein thrombosis involving the left popliteal vein, left posterior tibial veins, and left peroneal veins.  - No cystic structure found in the popliteal fossa.  *See table(s) above for measurements and observations.  Electronically signed by Heath Lark on 06/21/2023 at 4:18:20 PM.    Final     Pending Labs Unresulted Labs (From admission, onward)     Start     Ordered   06/24/23 0500  CBC  Daily,   R      06/23/23 0026   06/23/23 1200  APTT  Once-Timed,   TIMED        06/23/23 0026   06/23/23 0037  HIV-1/2 AB - differentiation  Once,   AD        06/23/23 0037            Vitals/Pain Today's Vitals   06/23/23 0916 06/23/23 1030 06/23/23 1055 06/23/23 1240  BP:  115/85    Pulse:  89    Resp:  (!) 28    Temp:    98.6 F (37 C)  TempSrc:    Oral  SpO2:  93%    PainSc: 8   4      Isolation Precautions No active isolations  Medications Medications  acetaminophen (TYLENOL) tablet 650 mg (has no administration in time range)  oxyCODONE (Oxy IR/ROXICODONE) immediate release tablet 5 mg (5 mg Oral Given 06/23/23 0931)  prochlorperazine (COMPAZINE) injection 5 mg (has no administration in time range)  polyethylene glycol (MIRALAX / GLYCOLAX) packet 17 g (has no administration in time range)  melatonin tablet 5 mg (has no administration in time range)  albuterol (PROVENTIL) (2.5 MG/3ML) 0.083% nebulizer solution 3 mL (has no administration in time range)  nicotine (NICODERM CQ - dosed in mg/24 hr) patch 7 mg (7 mg Transdermal Patch Applied 06/23/23 0933)  heparin ADULT infusion 100 units/mL (25000 units/220mL) (1,550 Units/hr Intravenous New Bag/Given 06/23/23 0527)  cefTRIAXone (ROCEPHIN) 2 g in sodium chloride 0.9 % 100 mL IVPB (0 g Intravenous Stopped 06/23/23 0915)  azithromycin (ZITHROMAX) 500 mg in dextrose 5 % 250 mL IVPB (500 mg Intravenous New Bag/Given 06/23/23 0915)  fentaNYL (SUBLIMAZE) injection 50 mcg (50 mcg Intravenous Given 06/22/23 2100)  sodium chloride 0.9 % bolus 1,000 mL (0 mLs Intravenous Stopped 06/23/23 0737)  iohexol (OMNIPAQUE) 350 MG/ML injection 75 mL (75 mLs Intravenous Contrast Given 06/22/23 2116)    Mobility walks

## 2023-06-24 DIAGNOSIS — I2601 Septic pulmonary embolism with acute cor pulmonale: Secondary | ICD-10-CM | POA: Diagnosis not present

## 2023-06-24 DIAGNOSIS — Z21 Asymptomatic human immunodeficiency virus [HIV] infection status: Secondary | ICD-10-CM

## 2023-06-24 LAB — CBC
HCT: 38.1 % — ABNORMAL LOW (ref 39.0–52.0)
Hemoglobin: 12.8 g/dL — ABNORMAL LOW (ref 13.0–17.0)
MCH: 28.6 pg (ref 26.0–34.0)
MCHC: 33.6 g/dL (ref 30.0–36.0)
MCV: 85.2 fL (ref 80.0–100.0)
Platelets: 105 10*3/uL — ABNORMAL LOW (ref 150–400)
RBC: 4.47 MIL/uL (ref 4.22–5.81)
RDW: 13.6 % (ref 11.5–15.5)
WBC: 5.3 10*3/uL (ref 4.0–10.5)
nRBC: 0 % (ref 0.0–0.2)

## 2023-06-24 LAB — BETA HCG QUANT (REF LAB): hCG Quant: <1 m[IU]/mL (ref 0–3)

## 2023-06-24 LAB — APTT: aPTT: 75 s — ABNORMAL HIGH (ref 24–36)

## 2023-06-24 LAB — AFP TUMOR MARKER: AFP, Serum, Tumor Marker: <1.8 ng/mL (ref 0.0–6.9)

## 2023-06-24 LAB — HEPARIN LEVEL (UNFRACTIONATED): Heparin Unfractionated: 0.46 [IU]/mL (ref 0.30–0.70)

## 2023-06-24 NOTE — Progress Notes (Addendum)
PHARMACY - ANTICOAGULATION CONSULT NOTE  Pharmacy Consult for heparin Indication: DVT/PE  No Known Allergies  Patient Measurements: Height: 5\' 11"  (180.3 cm) Weight: 130.7 kg (288 lb 2.3 oz) IBW/kg (Calculated) : 75.3 Heparin Dosing Weight: 91kg  Vital Signs: Temp: 97.8 F (36.6 C) (11/03 0636) Temp Source: Oral (11/03 0636) BP: 117/66 (11/03 0636) Pulse Rate: 75 (11/03 0636)  Labs: Recent Labs    06/22/23 2030 06/22/23 2237 06/23/23 0500 06/23/23 1245 06/23/23 1936 06/24/23 0613  HGB 14.3  --  12.8*  --   --   --   HCT 43.1  --  38.7*  --   --   --   PLT 105*  --  101*  --   --   --   APTT  --  33  --  45* 62*  --   HEPARINUNFRC  --  >1.10*  --   --   --  0.46  CREATININE 0.83  --  0.78  --   --   --     Estimated Creatinine Clearance: 179.4 mL/min (by C-G formula based on SCr of 0.78 mg/dL).   Medical History: Past Medical History:  Diagnosis Date   Medical history non-contributory      Assessment: 32 yo male with recent diagnosis of testicular cancer and recent new DVT started on Eliquis 10/31 presents with pain all over and some worsening SOB. CTA chest with acute pulmonary emboli in the right lower lobe main artery with extension into at least two posterior basal segmental arteries, scattered thrombus in some of the downstream subsegmental posterior basilar arteries, some of which does appear occluding within the smaller arteries. Additional nonocclusive thrombus in the right upper lobe anterior segmental artery with extension into one downstream subsegmental branch. Overall this is a small clot burden without findings of acute right heart strain. Pharmacy consulted to manage heparin infusion.  Last dose Eliquis 10 mg 11/1 at 1730  Today, 06/24/23: -Heparin level therapeutic at 0.46 -aPTT therapeutic at 75 - aPTT and heparin level now appear to be correlating  -Hgb 12.8--stable. Plts 105--low, stable.  -No s/sx of bleeding reported  Goal of Therapy:   Heparin level 0.3-0.7 units/ml aPTT 66-102 seconds Monitor platelets by anticoagulation protocol: Yes   Plan:  -Continue heparin gtt @1900  units/hr -Stop checking aPTT now that both heparin level and aPTT are correlating -Monitor heparin level, CBC, s/sx of bleeding daily   Cherylin Mylar, PharmD Clinical Pharmacist  11/3/20246:57 AM

## 2023-06-24 NOTE — Progress Notes (Signed)
PROGRESS NOTE    Steve Stewart  SWF:[id number] DOB: 1991/04/06 DOA: 06/22/2023 PCP: Patient, No Pcp Per  Outpatient Specialists:     Brief Narrative:  Patient is a 32 year old male with past medical history significant for testicular cancer, recent diagnosis of left lower extremity DVT (on 06/21/2023: And was started on Eliquis).  Patient was admitted with worsening shortness of breath and pleuritic chest pain.  CTA chest revealed extensive acute pulmonary embolism.  Patient has been on heparin drip.  Eventually transition to Eliquis.  Significantly, screening HIV test came back negative.  HIV RNA has been ordered by the infectious disease to confirm result.  Communicated above results with infectious disease team.  06/24/2023: Patient seen.  Shortness of breath has improved significantly.  No other symptoms endorsed.   Assessment & Plan:   Principal Problem:   Acute pulmonary embolism (HCC)  Acute pulmonary embolism in the setting of testicular cancer, no right heart strain per CT scan: -Recent Doppler ultrasound of lower extremities revealed left lower extremity DVT, on Eliquis since 06/21/2023. -Patient presented with shortness of breath and pleuritic chest pain. -CTA chest done on presentation revealed extensive acute pulmonary embolism. -Eliquis is on hold.  Patient is currently on heparin drip. -Echo does not show strain. -Patient will be transitioned to DOAC. -Suspect the PE/DVT is related to current diagnosis of testicular cancer.    Possible pneumonia seen on CT scan -Procalcitonin is less than 0.1. -Findings are likely related to acute pulmonary embolism. -Will discontinue azithromycin and ceftriaxone. -Continue to monitor patient closely.     Recent diagnosis of testicular cancer Will follow-up with oncology outpatient   Mild hypovolemic hyponatremia Serum sodium 133 Gentle IV fluid hydration NS at 50 cc/h x 1 day Renal panel in AM.  Positive screening HIV  test: -Pursue confirmatory testing. -Infectious disease team has been consulted.   DVT prophylaxis: Patient is on heparin drip Code Status: Full code. Family Communication:  Disposition Plan: Home eventually.   Consultants:  Infectious disease.  Procedures:  None.  Antimicrobials:  Discontinue Rocephin. Discontinue ceftriaxone.   Subjective: No shortness of breath. No chest pain.  Objective: Vitals:   06/23/23 1800 06/23/23 2252 06/24/23 0120 06/24/23 0636  BP: 116/73 124/72 118/74 117/66  Pulse: 87 85 86 75  Resp:  20 20 20   Temp: 98.4 F (36.9 C) 98.6 F (37 C) 98.8 F (37.1 C) 97.8 F (36.6 C)  TempSrc: Oral Oral Oral Oral  SpO2: 93% 97% 96% 96%  Weight:      Height:        Intake/Output Summary (Last 24 hours) at 06/24/2023 1843 Last data filed at 06/24/2023 1757 Gross per 24 hour  Intake 1770.98 ml  Output --  Net 1770.98 ml   Filed Weights   06/23/23 1413  Weight: 130.7 kg    Examination:  General exam: Appears calm and comfortable.  Patient is morbidly obese. Respiratory system: Clear to auscultation. Cardiovascular system: S1 & S2 heard Gastrointestinal system: Abdomen is soft and nontender.  Central nervous system: Alert and oriented. No focal neurological deficits. Extremities: No leg edema.    Data Reviewed: I have personally reviewed following labs and imaging studies  CBC: Recent Labs  Lab 06/20/23 0846 06/22/23 2030 06/23/23 0500 06/24/23 0613  WBC 6.1 8.0 6.2 5.3  NEUTROABS 3.2 4.5  --   --   HGB 14.9 14.3 12.8* 12.8*  HCT 44.7 43.1 38.7* 38.1*  MCV 85.3 86.2 86.2 85.2  PLT 97* 105* 101* 105*  Basic Metabolic Panel: Recent Labs  Lab 06/20/23 0846 06/22/23 2030 06/23/23 0500  NA 137 127* 133*  K 4.1 4.7 4.0  CL 104 95* 103  CO2 26 22 23   GLUCOSE 88 99 100*  BUN 16 14 12   CREATININE 1.06 0.83 0.78  CALCIUM 9.1 9.2 8.5*  MG  --   --  2.1  PHOS  --   --  3.7   GFR: Estimated Creatinine Clearance: 179.4  mL/min (by C-G formula based on SCr of 0.78 mg/dL). Liver Function Tests: Recent Labs  Lab 06/20/23 0846 06/22/23 2030  AST 17 25  ALT 26 27  ALKPHOS 64 62  BILITOT 0.6 1.3*  PROT 7.7 7.8  ALBUMIN 4.2 3.9   No results for input(s): "LIPASE", "AMYLASE" in the last 168 hours. No results for input(s): "AMMONIA" in the last 168 hours. Coagulation Profile: No results for input(s): "INR", "PROTIME" in the last 168 hours. Cardiac Enzymes: No results for input(s): "CKTOTAL", "CKMB", "CKMBINDEX", "TROPONINI" in the last 168 hours. BNP (last 3 results) No results for input(s): "PROBNP" in the last 8760 hours. HbA1C: No results for input(s): "HGBA1C" in the last 72 hours. CBG: No results for input(s): "GLUCAP" in the last 168 hours. Lipid Profile: No results for input(s): "CHOL", "HDL", "LDLCALC", "TRIG", "CHOLHDL", "LDLDIRECT" in the last 72 hours. Thyroid Function Tests: No results for input(s): "TSH", "T4TOTAL", "FREET4", "T3FREE", "THYROIDAB" in the last 72 hours. Anemia Panel: No results for input(s): "VITAMINB12", "FOLATE", "FERRITIN", "TIBC", "IRON", "RETICCTPCT" in the last 72 hours. Urine analysis:    Component Value Date/Time   COLORURINE YELLOW 06/22/2023 2026   APPEARANCEUR CLEAR 06/22/2023 2026   LABSPEC 1.025 06/22/2023 2026   PHURINE 6.0 06/22/2023 2026   GLUCOSEU NEGATIVE 06/22/2023 2026   HGBUR NEGATIVE 06/22/2023 2026   BILIRUBINUR NEGATIVE 06/22/2023 2026   KETONESUR NEGATIVE 06/22/2023 2026   PROTEINUR NEGATIVE 06/22/2023 2026   NITRITE NEGATIVE 06/22/2023 2026   LEUKOCYTESUR NEGATIVE 06/22/2023 2026   Sepsis Labs: @LABRCNTIP (procalcitonin:4,lacticidven:4)  ) Recent Results (from the past 240 hour(s))  Resp panel by RT-PCR (RSV, Flu A&B, Covid) Anterior Nasal Swab     Status: None   Collection Time: 06/22/23  9:05 PM   Specimen: Anterior Nasal Swab  Result Value Ref Range Status   SARS Coronavirus 2 by RT PCR NEGATIVE NEGATIVE Final    Comment:  (NOTE) SARS-CoV-2 target nucleic acids are NOT DETECTED.  The SARS-CoV-2 RNA is generally detectable in upper respiratory specimens during the acute phase of infection. The lowest concentration of SARS-CoV-2 viral copies this assay can detect is 138 copies/mL. A negative result does not preclude SARS-Cov-2 infection and should not be used as the sole basis for treatment or other patient management decisions. A negative result may occur with  improper specimen collection/handling, submission of specimen other than nasopharyngeal swab, presence of viral mutation(s) within the areas targeted by this assay, and inadequate number of viral copies(<138 copies/mL). A negative result must be combined with clinical observations, patient history, and epidemiological information. The expected result is Negative.  Fact Sheet for Patients:  BloggerCourse.com  Fact Sheet for Healthcare Providers:  SeriousBroker.it  This test is no t yet approved or cleared by the [place] FDA and  has been authorized for detection and/or diagnosis of SARS-CoV-2 by FDA under an Emergency Use Authorization (EUA). This EUA will remain  in effect (meaning this test can be used) for the duration of the COVID-19 declaration under Section 564(b)(1) of the Act, 21 U.S.C.section 360bbb-3(b)(1), unless  the authorization is terminated  or revoked sooner.       Influenza A by PCR NEGATIVE NEGATIVE Final   Influenza B by PCR NEGATIVE NEGATIVE Final    Comment: (NOTE) The Xpert Xpress SARS-CoV-2/FLU/RSV plus assay is intended as an aid in the diagnosis of influenza from Nasopharyngeal swab specimens and should not be used as a sole basis for treatment. Nasal washings and aspirates are unacceptable for Xpert Xpress SARS-CoV-2/FLU/RSV testing.  Fact Sheet for Patients: BloggerCourse.com  Fact Sheet for Healthcare  Providers: SeriousBroker.it  This test is not yet approved or cleared by the [place] FDA and has been authorized for detection and/or diagnosis of SARS-CoV-2 by FDA under an Emergency Use Authorization (EUA). This EUA will remain in effect (meaning this test can be used) for the duration of the COVID-19 declaration under Section 564(b)(1) of the Act, 21 U.S.C. section 360bbb-3(b)(1), unless the authorization is terminated or revoked.     Resp Syncytial Virus by PCR NEGATIVE NEGATIVE Final    Comment: (NOTE) Fact Sheet for Patients: BloggerCourse.com  Fact Sheet for Healthcare Providers: SeriousBroker.it  This test is not yet approved or cleared by the [place] FDA and has been authorized for detection and/or diagnosis of SARS-CoV-2 by FDA under an Emergency Use Authorization (EUA). This EUA will remain in effect (meaning this test can be used) for the duration of the COVID-19 declaration under Section 564(b)(1) of the Act, 21 U.S.C. section 360bbb-3(b)(1), unless the authorization is terminated or revoked.  Performed at [facility], [address], [place], [place] [address]          Radiology Studies: ECHOCARDIOGRAM COMPLETE  Result Date: 06/23/2023    ECHOCARDIOGRAM REPORT   Patient Name:   RISHAN OYAMA Date of Exam: 06/23/2023 Medical Rec #:  [id number]    Height:       71.0 in Accession #:    [id number]   Weight:       288.1 lb Date of Birth:  01/30/1991    BSA:          2.463 m Patient Age:    32 years     BP:           115/85 mmHg Patient Gender: M            HR:           90 bpm. Exam Location:  Inpatient Procedure: 2D Echo, Cardiac Doppler and Color Doppler Indications:    I26.02 Pulmonary embolus  History:        Patient has no prior history of Echocardiogram examinations.                 Signs/Symptoms:Shortness of Breath and Dyspnea. Cancer.  Sonographer:    Sheralyn Boatman RDCS Referring Phys: [id number] CAROLE N HALL  Sonographer Comments: Technically difficult study due to poor echo windows. Patient supine due to pain. IMPRESSIONS  1. Left ventricular ejection fraction, by estimation, is 60 to 65%. The left ventricle has normal function. The left ventricle has no regional wall motion abnormalities. There is mild concentric left ventricular hypertrophy. Left ventricular diastolic parameters were normal.  2. Right ventricular systolic function is normal. The right ventricular size is normal.  3. The mitral valve is normal in structure. Trivial mitral valve regurgitation. No evidence of mitral stenosis.  4. The aortic valve is tricuspid. Aortic valve regurgitation is not visualized. No aortic stenosis is present.  5. The inferior vena cava is normal in size with  greater than 50% respiratory variability, suggesting right atrial pressure of 3 mmHg. Comparison(s): No prior Echocardiogram. FINDINGS  Left Ventricle: Left ventricular ejection fraction, by estimation, is 60 to 65%. The left ventricle has normal function. The left ventricle has no regional wall motion abnormalities. The left ventricular internal cavity size was normal in size. There is  mild concentric left ventricular hypertrophy. Left ventricular diastolic parameters were normal. Right Ventricle: The right ventricular size is normal. Right ventricular systolic function is normal. Left Atrium: Left atrial size was normal in size. Right Atrium: Right atrial size was normal in size. Pericardium: There is no evidence of pericardial effusion. Mitral Valve: The mitral valve is normal in structure. Trivial mitral valve regurgitation. No evidence of mitral valve stenosis. Tricuspid Valve: The tricuspid valve is normal in structure. Tricuspid valve regurgitation is not demonstrated. No evidence of tricuspid stenosis. Aortic Valve: The aortic valve is tricuspid. Aortic valve regurgitation is not visualized. No aortic stenosis is  present. Pulmonic Valve: The pulmonic valve was grossly normal. Pulmonic valve regurgitation is not visualized. No evidence of pulmonic stenosis. Aorta: The aortic root is normal in size and structure. Venous: The inferior vena cava is normal in size with greater than 50% respiratory variability, suggesting right atrial pressure of 3 mmHg. IAS/Shunts: The interatrial septum was not well visualized.  LEFT VENTRICLE PLAX 2D LVIDd:         4.50 cm     Diastology LVIDs:         3.20 cm     LV e' medial:    9.14 cm/s LV PW:         1.30 cm     LV E/e' medial:  8.6 LV IVS:        1.10 cm     LV e' lateral:   11.30 cm/s LVOT diam:     2.70 cm     LV E/e' lateral: 7.0 LV SV:         111 LV SV Index:   45 LVOT Area:     5.73 cm  LV Volumes (MOD) LV vol d, MOD A2C: 48.0 ml LV vol d, MOD A4C: 59.4 ml LV vol s, MOD A2C: 16.6 ml LV vol s, MOD A4C: 25.4 ml LV SV MOD A2C:     31.4 ml LV SV MOD A4C:     59.4 ml LV SV MOD BP:      32.8 ml RIGHT VENTRICLE             IVC RV S prime:     13.10 cm/s  IVC diam: 1.90 cm TAPSE (M-mode): 1.7 cm LEFT ATRIUM             Index        RIGHT ATRIUM           Index LA diam:        3.60 cm 1.46 cm/m   RA Area:     12.30 cm LA Vol (A2C):   23.5 ml 9.54 ml/m   RA Volume:   26.20 ml  10.64 ml/m LA Vol (A4C):   34.9 ml 14.17 ml/m LA Biplane Vol: 29.0 ml 11.78 ml/m  AORTIC VALVE LVOT Vmax:   112.00 cm/s LVOT Vmean:  65.600 cm/s LVOT VTI:    0.194 m  AORTA Ao Root diam: 3.40 cm Ao Asc diam:  3.30 cm MITRAL VALVE MV Area (PHT): 3.66 cm    SHUNTS MV Decel Time: 208 msec    Systemic VTI:  0.19 m MV E velocity: 79.00 cm/s  Systemic Diam: 2.70 cm MV A velocity: 62.70 cm/s MV E/A ratio:  1.26 Olga Millers MD Electronically signed by Olga Millers MD Signature Date/Time: 06/23/2023/3:34:29 PM    Final    CT Angio Chest PE W and/or Wo Contrast  Result Date: 06/22/2023 CLINICAL DATA:  Recent diagnosis left testicular cancer with orchiectomy 05/02/2023, imaging guided right chest port insertion  06/18/2023, presents with dyspnea and chest wall pain with suspected DVT. EXAM: CT ANGIOGRAPHY CHEST WITH CONTRAST TECHNIQUE: Multidetector CT imaging of the chest was performed using the standard protocol during bolus administration of intravenous contrast. Multiplanar CT image reconstructions and MIPs were obtained to evaluate the vascular anatomy. RADIATION DOSE REDUCTION: This exam was performed according to the departmental dose-optimization program which includes automated exposure control, adjustment of the mA and/or kV according to patient size and/or use of iterative reconstruction technique. CONTRAST:  75mL OMNIPAQUE IOHEXOL 350 MG/ML SOLN COMPARISON:  Chest, abdomen pelvis CT with IV contrast 04/28/2023, MRI abdomen earlier today. FINDINGS: Cardiovascular: Interval new insertion of a right chest port with IJ approach catheter terminating at about the superior cavoatrial junction. The cardiac size is upper limits normal. There is no pericardial effusion. No visible coronary calcifications. The aorta, great vessels, and pulmonary veins are normal. The pulmonary arteries are within normal caliber limits. There is hypodense acute thrombus, which grossly appears nonoccluding, in the right lower lobe main artery with extension into at least 2 posterior basal segmental arteries and with scattered thrombus in some of the downstream subsegmental posterior basilar arteries, some of which does appear occluding within the smaller arteries. In the right upper lobe, additional nonocclusive thrombus is seen in the anterior segmental artery with extension into 1 downstream subsegmental branch. No other arterial embolic disease is seen. Overall this is a small clot burden without findings of right heart strain. Mediastinum/Nodes: Mildly enlarged bilateral hilar lymph nodes are again noted, largest on the right is 1.5 cm short axis on 4:57, unchanged. Largest on the left 1.3 cm short axis on 4:67, stable. There are shotty  prevascular space nodes which were also noted previously. Largest of these is 8 mm in short axis on 4:53, with a subcarinal nodal complex measuring 1.5 cm short axis, slightly larger than previously. There is no other mediastinal adenopathy. No thyroid mass. Bilateral axillary adenopathy is redemonstrated. Again the largest right axillary node is 2.3 x 2.7 cm on 4:45, and the largest on the left 3.2 x 1.8 cm on 4:55, all unchanged. The thoracic trachea, main bronchi and thoracic esophagus are unremarkable. Lungs/Pleura: There is patchy consolidation in the posterior basal right lower lobe which is probably related to 1 or more evolving infarctions, interspersed with ground-glass disease. Underlying pneumonia is possible in the proper clinical setting. There is increased atelectasis in the posterior left lower lobe superior segment and in the medial segment of the right middle lobe. The lungs are otherwise clear. There is no pleural effusion, thickening or pneumothorax. Upper Abdomen: Fatty liver with mild hepatosplenomegaly, splenic coronal diameter 16 cm, previously also 16 cm, with again noted 2.1 cm stone in the distal gallbladder. No acute upper abdominal abnormality. Stable bilateral adrenal adenomas. Mild elevation right hemidiaphragm. Musculoskeletal: No chest wall abnormality. No acute or significant osseous findings. Review of the MIP images confirms the above findings. IMPRESSION: 1. Acute pulmonary emboli in the right lower lobe main artery with extension into at least 2 posterior basal segmental arteries, scattered thrombus in some of the  downstream subsegmental posterior basilar arteries, some of which does appear occluding within the smaller arteries. 2. Additional nonocclusive thrombus in the right upper lobe anterior segmental artery with extension into 1 downstream subsegmental branch. 3. Overall this is a small clot burden without findings of acute right heart strain. 4. Patchy consolidation in the  posterior basal right lower lobe which is probably related to 1 or more evolving infarctions, interspersed with ground-glass disease. Underlying pneumonia is possible in the proper clinical setting. 5. Stable bilateral axillary adenopathy. Stable mild bihilar adenopathy. 6. Stable fatty liver with mild hepatosplenomegaly. Cholelithiasis. Stable bilateral adrenal adenomas. 7. Critical Value/emergent results were called by telephone at the time of interpretation on 06/22/2023 at 10:06 pm to provider ABIGAIL HARRIS , who verbally acknowledged these results. Electronically Signed   By: Almira Bar M.D.   On: 06/22/2023 22:18        Scheduled Meds:  nicotine  7 mg Transdermal Daily   Continuous Infusions:  azithromycin 500 mg (06/24/23 0845)   cefTRIAXone (ROCEPHIN)  IV 2 g (06/24/23 0800)   heparin 1,900 Units/hr (06/24/23 0746)     LOS: 2 days    Time spent: 35 minutes.    Berton Mount, MD  [facility] Pager #: [phone] 7PM-7AM contact night coverage as above

## 2023-06-25 ENCOUNTER — Ambulatory Visit: Payer: Self-pay

## 2023-06-25 ENCOUNTER — Encounter (HOSPITAL_COMMUNITY): Payer: Self-pay | Admitting: Internal Medicine

## 2023-06-25 ENCOUNTER — Other Ambulatory Visit (HOSPITAL_COMMUNITY): Payer: Self-pay

## 2023-06-25 ENCOUNTER — Other Ambulatory Visit: Payer: Self-pay

## 2023-06-25 ENCOUNTER — Inpatient Hospital Stay (HOSPITAL_COMMUNITY): Payer: 59

## 2023-06-25 DIAGNOSIS — C779 Secondary and unspecified malignant neoplasm of lymph node, unspecified: Secondary | ICD-10-CM | POA: Diagnosis not present

## 2023-06-25 DIAGNOSIS — C6292 Malignant neoplasm of left testis, unspecified whether descended or undescended: Secondary | ICD-10-CM

## 2023-06-25 DIAGNOSIS — C6210 Malignant neoplasm of unspecified descended testis: Secondary | ICD-10-CM | POA: Diagnosis not present

## 2023-06-25 DIAGNOSIS — E871 Hypo-osmolality and hyponatremia: Secondary | ICD-10-CM | POA: Diagnosis not present

## 2023-06-25 DIAGNOSIS — I2690 Septic pulmonary embolism without acute cor pulmonale: Secondary | ICD-10-CM

## 2023-06-25 DIAGNOSIS — B20 Human immunodeficiency virus [HIV] disease: Secondary | ICD-10-CM | POA: Diagnosis not present

## 2023-06-25 DIAGNOSIS — I2699 Other pulmonary embolism without acute cor pulmonale: Secondary | ICD-10-CM | POA: Diagnosis not present

## 2023-06-25 LAB — HIV-1/2 AB - DIFFERENTIATION
HIV 1 Ab: REACTIVE
HIV 2 Ab: UNDETERMINED

## 2023-06-25 LAB — HIV-1 RNA QUANT-NO REFLEX-BLD
HIV 1 RNA Quant: 59200 {copies}/mL
LOG10 HIV-1 RNA: 4.772 {Log}

## 2023-06-25 LAB — CBC
HCT: 40.2 % (ref 39.0–52.0)
Hemoglobin: 13.1 g/dL (ref 13.0–17.0)
MCH: 28.3 pg (ref 26.0–34.0)
MCHC: 32.6 g/dL (ref 30.0–36.0)
MCV: 86.8 fL (ref 80.0–100.0)
Platelets: 125 10*3/uL — ABNORMAL LOW (ref 150–400)
RBC: 4.63 MIL/uL (ref 4.22–5.81)
RDW: 13.6 % (ref 11.5–15.5)
WBC: 5.1 10*3/uL (ref 4.0–10.5)
nRBC: 0 % (ref 0.0–0.2)

## 2023-06-25 LAB — HEPARIN LEVEL (UNFRACTIONATED): Heparin Unfractionated: 0.4 [IU]/mL (ref 0.30–0.70)

## 2023-06-25 MED ORDER — APIXABAN 5 MG PO TABS: 5.0000 mg | ORAL_TABLET | Freq: Two times a day (BID) | ORAL | Status: DC

## 2023-06-25 MED ORDER — APIXABAN 5 MG PO TABS: 10.0000 mg | ORAL_TABLET | Freq: Two times a day (BID) | ORAL | Status: DC

## 2023-06-25 MED ORDER — IOHEXOL 9 MG/ML PO SOLN
ORAL | Status: AC
  Filled 2023-06-25: qty 1000

## 2023-06-25 MED ORDER — IOHEXOL 300 MG/ML  SOLN
100.0000 mL | Freq: Once | INTRAMUSCULAR | Status: AC | PRN
  Administered 2023-06-25: 100 mL via INTRAVENOUS

## 2023-06-25 MED ORDER — APIXABAN (ELIQUIS) EDUCATION KIT FOR DVT/PE PATIENTS: PACK | Freq: Once | Status: DC

## 2023-06-25 MED ORDER — SODIUM CHLORIDE (PF) 0.9 % IJ SOLN
INTRAMUSCULAR | Status: AC
Start: 2023-06-25 — End: ?
  Filled 2023-06-25: qty 50

## 2023-06-25 MED ORDER — IOHEXOL 9 MG/ML PO SOLN
500.0000 mL | ORAL | Status: AC
  Administered 2023-06-25: 500 mL via ORAL

## 2023-06-25 MED ORDER — HEPARIN (PORCINE) 25000 UT/250ML-% IV SOLN: 1900.0000 [IU]/h | INTRAVENOUS | Status: DC

## 2023-06-25 NOTE — Consult Note (Signed)
[facility]       Reason for Consult:HIV screen test positive    Referring Physician: Dr. Dartha Lodge  Principal Problem:   Acute pulmonary embolism (HCC)    iohexol  500 mL Oral Q1H   nicotine  7 mg Transdermal Daily    Recommendations: Await confirmatory testing Will contact patient if positive  Can discharge at any time from ID standpoint, he confirms contact info up to date   Assessment: Screen positive HIV   HPI: Steve Stewart is a 32 y.o. male with history of testicular cancer on treatment here with shortness of breath and found to have a preliminary postiive HIV test.   He remembers a negative test about 1 year ago.  Denies any particular risk factors.  Here with a PE.  No fever, no leukocytosis.     Review of Systems:  Constitutional: negative for malaise, anorexia, and weight loss All other systems reviewed and are negative    Past Medical History:  Diagnosis Date   Medical history non-contributory     Social History   Tobacco Use   Smoking status: Every Day    Current packs/day: 0.50    Types: Cigarettes   Smokeless tobacco: Never   Tobacco comments:    Under a half of a pack a day  Vaping Use   Vaping status: Never Used  Substance Use Topics   Alcohol use: No   Drug use: No    History reviewed. No pertinent family history.  No Known Allergies  Physical Exam: Constitutional: in no apparent distress  Vitals:   06/24/23 2127 06/25/23 0626  BP: 117/72 116/72  Pulse: 72 (!) 59  Resp: 18 20  Temp: 98.6 F (37 C) 97.7 F (36.5 C)  SpO2: 98% 97%   EYES: anicteric Respiratory: normal respiratory effort   Lab Results  Component Value Date   WBC 5.1 06/25/2023   HGB 13.1 06/25/2023   HCT 40.2 06/25/2023   MCV 86.8 06/25/2023   PLT 125 (L) 06/25/2023    Lab Results  Component Value Date   CREATININE 0.78 06/23/2023   BUN 12 06/23/2023   NA 133 (L) 06/23/2023   K 4.0 06/23/2023   CL 103 06/23/2023   CO2 23  06/23/2023    Lab Results  Component Value Date   ALT 27 06/22/2023   AST 25 06/22/2023   ALKPHOS 62 06/22/2023     Microbiology: Recent Results (from the past 240 hour(s))  Resp panel by RT-PCR (RSV, Flu A&B, Covid) Anterior Nasal Swab     Status: None   Collection Time: 06/22/23  9:05 PM   Specimen: Anterior Nasal Swab  Result Value Ref Range Status   SARS Coronavirus 2 by RT PCR NEGATIVE NEGATIVE Final    Comment: (NOTE) SARS-CoV-2 target nucleic acids are NOT DETECTED.  The SARS-CoV-2 RNA is generally detectable in upper respiratory specimens during the acute phase of infection. The lowest concentration of SARS-CoV-2 viral copies this assay can detect is 138 copies/mL. A negative result does not preclude SARS-Cov-2 infection and should not be used as the sole basis for treatment or other patient management decisions. A negative result may occur with  improper specimen collection/handling, submission of specimen other than nasopharyngeal swab, presence of viral mutation(s) within the areas targeted by this assay, and inadequate number of viral copies(<138 copies/mL). A negative result must be combined with clinical observations, patient history, and epidemiological information. The expected result is Negative.  Fact Sheet for  Patients:  BloggerCourse.com  Fact Sheet for Healthcare Providers:  SeriousBroker.it  This test is no t yet approved or cleared by the [place] FDA and  has been authorized for detection and/or diagnosis of SARS-CoV-2 by FDA under an Emergency Use Authorization (EUA). This EUA will remain  in effect (meaning this test can be used) for the duration of the COVID-19 declaration under Section 564(b)(1) of the Act, 21 U.S.C.section 360bbb-3(b)(1), unless the authorization is terminated  or revoked sooner.       Influenza A by PCR NEGATIVE NEGATIVE Final   Influenza B by PCR NEGATIVE NEGATIVE  Final    Comment: (NOTE) The Xpert Xpress SARS-CoV-2/FLU/RSV plus assay is intended as an aid in the diagnosis of influenza from Nasopharyngeal swab specimens and should not be used as a sole basis for treatment. Nasal washings and aspirates are unacceptable for Xpert Xpress SARS-CoV-2/FLU/RSV testing.  Fact Sheet for Patients: BloggerCourse.com  Fact Sheet for Healthcare Providers: SeriousBroker.it  This test is not yet approved or cleared by the [place] FDA and has been authorized for detection and/or diagnosis of SARS-CoV-2 by FDA under an Emergency Use Authorization (EUA). This EUA will remain in effect (meaning this test can be used) for the duration of the COVID-19 declaration under Section 564(b)(1) of the Act, 21 U.S.C. section 360bbb-3(b)(1), unless the authorization is terminated or revoked.     Resp Syncytial Virus by PCR NEGATIVE NEGATIVE Final    Comment: (NOTE) Fact Sheet for Patients: BloggerCourse.com  Fact Sheet for Healthcare Providers: SeriousBroker.it  This test is not yet approved or cleared by the [place] FDA and has been authorized for detection and/or diagnosis of SARS-CoV-2 by FDA under an Emergency Use Authorization (EUA). This EUA will remain in effect (meaning this test can be used) for the duration of the COVID-19 declaration under Section 564(b)(1) of the Act, 21 U.S.C. section 360bbb-3(b)(1), unless the authorization is terminated or revoked.  Performed at [facility], [address], [place], [place] [address]     Gardiner Barefoot, MD [facility] [email or web address] 06/25/2023, 12:00 PM

## 2023-06-25 NOTE — Consult Note (Signed)
[facility] ADMISSION NOTE  Patient Care Team: Patient, [name] - General (General Practice)  Addendum  Received message from radiology.  Report of RP/iliac/inguinal LAD seen again today and similar to Sept CT. I let Dr. Dareen Piano know he can be discharged.   ASSESSMENT & PLAN:  32 y.o. 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  He was found to have multiple lymphadenopathy above and below the diaphragm complaining on starting chemotherapy.  Due to loss of insurance he was unable to complete several outpatient studies with some delaying imaging.  He had follow-up last week in the office and at that time complained of left lower extremity pain.  Stat US was obtain and found acute DVT. He was prescribed with apixaban.  However, after pulmonary function test he had worsening shortness of breath and chest pain and presented to ED for evaluation.  He was found to have PE and started heparin drip.  Over the weekend, he has improvement of his symptoms and now mostly resolved.  He has no recurrent chest pain or shortness of breath.  Leg pain is mostly resolved.  In the meantime, he was found to have positive for HIV and further testing being requested.  His MRI evaluation for adrenal lesion showed adenoma and reported benign.  However, no evidence of lymphadenopathy from MRI of the abdomen. Previously CT reported retroperitoneal, bilateral iliac chain lymph nodes measuring up to 1.1 cm in short axis. CT angio on admission showed bilateral axillary lymphadenopathy with largest on the left 3.2 x 1.8 cm.  Right axillary node 2.3 x 2.7 cm.  Mildly enlarged bilateral hilar nodes are noted largest on the right 1.5 cm in short axis.  Largest on the left 1.3 cm.  Also prevascular, subcarinal node.  Given the above inconsistent findings, I recommend further evaluation with CT scan to assess for lymphadenopathy.  Previous CT suggestive of metastatic disease.  HIV can also cause  lymphadenopathy.  If there are clear retroperitoneal lymphadenopathy, patient may go home with oral anticoagulation and plan on outpatient chemotherapy.  If there is no clear intra-abdominal or retroperitoneal lymphadenopathy, I recommend biopsy on the axillary node or above the diaphragm for confirmation metastases.  Acute DVT and PE Continue heparin drip If confirmed lymphadenopathy of the retroperitoneum or below the diaphragm, may be discharged with oral anticoagulation with apixaban If no lymphadenopathy below the diaphragm, recommend biopsy on one of the abnormal lymph nodes above the diaphragm for diagnosis.  Left testicular seminoma Status post orchiectomy.  Pending systemic treatment  HIV positive Management per primary.  Discharge planning Follow up in clinic   All questions were answered. The patient knows to call the clinic with any problems, questions or concerns.   [name], MD 06/25/2023 3:28 PM   CHIEF COMPLAINTS/PURPOSE OF ADMISSION Pulmonary embolism with testicular cancer  HISTORY OF PRESENTING ILLNESS:  Steve Stewart 32 y.o. male is admitted for acute pulmonary embolism.  After pulmonary function testing last week, he reported worsening chest pain, difficulty breathing and resulting emergency room visit.  He describes difficulty taking deep breath.  On admission, CT was performed and found acute pulmonary embolism on the right side.  He has been on heparin drip and since feeling better.  He did report minimal discomfort or chest pain, and no short of breath anymore.  His leg pain has mostly resolved.  No new swelling.  He denies any bleeding.  Summary of oncologic history as follows: Oncology History  Primary seminoma of left testis (HCC)  04/28/2023 Initial Diagnosis   Primary seminoma of left testis [facility]) Presented to ED for worsening testicular pain and presented with 2 months history of testicular mass.   04/28/2023 Imaging   [place] 1. Positive for abnormally  enlarged and heterogeneous but vascular Left Testis highly suspicious for Testicular Neoplasm. Recommend Urology consultation. 2. Negative for testicular torsion.  Right testis appears normal.   04/28/2023 Imaging   CT CAP 1. Partially visualized thickening of the skin of the left scrotum. 2. Prominent bilateral axillary lymph nodes, right greater than left. Suspect at least 2 lymph nodes with abnormal cortical thickening in the right axilla. 3. Shotty retroperitoneal, bilateral iliac chain lymph nodes measuring up to 1.1 cm in short axis. 4. Bilateral inguinal lymph nodes with borderline thickened left inguinal lymph nodes measuring up to 1.1 cm in short axis. 5. 1.2 cm right adrenal mass, indeterminate. Attention on follow-up is recommended. Alternatively evaluation with MRI may be considered. 6. Hazy attenuation within the anterior mediastinum without defined mass may represent residual thymus. Attention on follow-up is recommended. 7. Cholelithiasis. 8. L4-L5 bilateral pars articularis defect. 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 10. Further evaluation with axillary and inguinal ultrasound may be considered.   05/02/2023 Surgery   TESTICLE, LEFT, ORCHIECTOMY:  Seminoma, size 6.1 cm  Tumor limited to testis with rete testis and lymphovascular invasion  (pT2)  Germ cell neoplasia in situ is present  Spermatic cord and all margins of resection are negative for tumor     07/09/2023 -  Chemotherapy   Patient is on Treatment Plan : TESTICULAR BEP q21d x 3 Cycles       MEDICAL HISTORY:  Past Medical History:  Diagnosis Date   Medical history non-contributory     SURGICAL HISTORY: Past Surgical History:  Procedure Laterality Date   IR IMAGING GUIDED PORT INSERTION  06/18/2023   NO PAST SURGERIES     ORCHIECTOMY Left 05/02/2023   Procedure: LEFT INGUINAL ORCHIECTOMY;  Surgeon: Rene Paci, MD;  Location: [facility];  Service: Urology;   Laterality: Left;    SOCIAL HISTORY: Social History   Socioeconomic History   Marital status: Single    Spouse name: Not on file   Number of children: Not on file   Years of education: Not on file   Highest education level: Not on file  Occupational History   Not on file  Tobacco Use   Smoking status: Every Day    Current packs/day: 0.50    Types: Cigarettes   Smokeless tobacco: Never   Tobacco comments:    Under a half of a pack a day  Vaping Use   Vaping status: Never Used  Substance and Sexual Activity   Alcohol use: No   Drug use: No   Sexual activity: Yes  Other Topics Concern   Not on file  Social History Narrative   Not on file   Social Determinants of Health   Financial Resource Strain: Not on file  Food Insecurity: No Food Insecurity (06/23/2023)   Hunger Vital Sign    Worried About Running Out of Food in the Last Year: Never true    Ran Out of Food in the Last Year: Never true  Transportation Needs: No Transportation Needs (06/23/2023)   PRAPARE - Administrator, Civil Service (Medical): No    Lack of Transportation (Non-Medical): No  Physical Activity: Not on file  Stress: Not on  file  Social Connections: Unknown (01/02/2022)   Received from [facility]   Social Network    Social Network: Not on file  Intimate Partner Violence: Not At Risk (06/23/2023)   Humiliation, Afraid, Rape, and Kick questionnaire    Fear of Current or Ex-Partner: No    Emotionally Abused: No    Physically Abused: No    Sexually Abused: No    FAMILY HISTORY: History reviewed. No pertinent family history.  ALLERGIES:  has No Known Allergies.  MEDICATIONS:  Current Facility-Administered Medications  Medication Dose Route Frequency Provider Last Rate Last Admin   acetaminophen (TYLENOL) tablet 650 mg  650 mg Oral Q6H PRN Dow Adolph N, DO   650 mg at 06/25/23 0603   albuterol (PROVENTIL) (2.5 MG/3ML) 0.083% nebulizer solution 3 mL  3 mL Inhalation Q6H PRN Dow Adolph N, DO       heparin ADULT infusion 100 units/mL (25000 units/221mL)  1,900 Units/hr Intravenous Continuous Leeroy Bock, MD       melatonin tablet 5 mg  5 mg Oral QHS PRN Dow Adolph N, DO       nicotine (NICODERM CQ - dosed in mg/24 hr) patch 7 mg  7 mg Transdermal Daily Dow Adolph N, DO   7 mg at 06/25/23 5366   oxyCODONE (Oxy IR/ROXICODONE) immediate release tablet 5 mg  5 mg Oral Q6H PRN Dow Adolph N, DO   5 mg at 06/25/23 0604   polyethylene glycol (MIRALAX / GLYCOLAX) packet 17 g  17 g Oral Daily PRN Dow Adolph N, DO       prochlorperazine (COMPAZINE) injection 5 mg  5 mg Intravenous Q6H PRN Dow Adolph N, DO        REVIEW OF SYSTEMS:   All other systems were reviewed with the patient and are negative.  PHYSICAL EXAMINATION: ECOG PERFORMANCE STATUS: 1 - Symptomatic but completely ambulatory  Vitals:   06/25/23 0626 06/25/23 1316  BP: 116/72 112/68  Pulse: (!) 59 66  Resp: 20 20  Temp: 97.7 F (36.5 C) 98.2 F (36.8 C)  SpO2: 97% 98%   Filed Weights   06/23/23 1413  Weight: 288 lb 2.3 oz (130.7 kg)    GENERAL:alert, no distress and comfortable SKIN: skin color normal. No jaundice EYES: normal, sclera clear OROPHARYNX: no exudate, moist NECK: supple. No mass LYMPH:  no palpable cervical lymphadenopathy LUNGS: clear to auscultation and normal breathing effort.  No wheeze or rales HEART: regular rate & rhythm and no murmurs ABDOMEN:abdomen soft, non-tender and normal bowel sounds Musculoskeletal:  no lower extremity edema NEURO: alert   LABORATORY DATA:  I have reviewed the data as listed Lab Results  Component Value Date   WBC 5.1 06/25/2023   HGB 13.1 06/25/2023   HCT 40.2 06/25/2023   MCV 86.8 06/25/2023   PLT 125 (L) 06/25/2023   Recent Labs    05/10/23 0834 06/20/23 0846 06/22/23 2030 06/23/23 0500  NA 138 137 127* 133*  K 4.2 4.1 4.7 4.0  CL 105 104 95* 103  CO2 27 26 22 23   GLUCOSE 95 88 99 100*  BUN 19 16 14 12    CREATININE 0.99 1.06 0.83 0.78  CALCIUM 9.6 9.1 9.2 8.5*  GFRNONAA >60 >60 >60 >60  PROT 7.3 7.7 7.8  --   ALBUMIN 4.1 4.2 3.9  --   AST 14* 17 25  --   ALT 21 26 27   --   ALKPHOS 59 64 62  --   BILITOT  0.4 0.6 1.3*  --     RADIOGRAPHIC STUDIES: I have personally reviewed the radiological images as listed and agreed with the findings in the report. ECHOCARDIOGRAM COMPLETE  Result Date: 06/23/2023    ECHOCARDIOGRAM REPORT   Patient Name:   Steve Stewart Date of Exam: 06/23/2023 Medical Rec #:  [id number]    Height:       71.0 in Accession #:    [id number]   Weight:       288.1 lb Date of Birth:  28-Aug-1990    BSA:          2.463 m Patient Age:    32 years     BP:           115/85 mmHg Patient Gender: M            HR:           90 bpm. Exam Location:  Inpatient Procedure: 2D Echo, Cardiac Doppler and Color Doppler Indications:    I26.02 Pulmonary embolus  History:        Patient has no prior history of Echocardiogram examinations.                 Signs/Symptoms:Shortness of Breath and Dyspnea. Cancer.  Sonographer:    Sheralyn Boatman RDCS Referring Phys: [id number] CAROLE N HALL  Sonographer Comments: Technically difficult study due to poor echo windows. Patient supine due to pain. IMPRESSIONS  1. Left ventricular ejection fraction, by estimation, is 60 to 65%. The left ventricle has normal function. The left ventricle has no regional wall motion abnormalities. There is mild concentric left ventricular hypertrophy. Left ventricular diastolic parameters were normal.  2. Right ventricular systolic function is normal. The right ventricular size is normal.  3. The mitral valve is normal in structure. Trivial mitral valve regurgitation. No evidence of mitral stenosis.  4. The aortic valve is tricuspid. Aortic valve regurgitation is not visualized. No aortic stenosis is present.  5. The inferior vena cava is normal in size with greater than 50% respiratory variability, suggesting right atrial pressure of 3 mmHg.  Comparison(s): No prior Echocardiogram. FINDINGS  Left Ventricle: Left ventricular ejection fraction, by estimation, is 60 to 65%. The left ventricle has normal function. The left ventricle has no regional wall motion abnormalities. The left ventricular internal cavity size was normal in size. There is  mild concentric left ventricular hypertrophy. Left ventricular diastolic parameters were normal. Right Ventricle: The right ventricular size is normal. Right ventricular systolic function is normal. Left Atrium: Left atrial size was normal in size. Right Atrium: Right atrial size was normal in size. Pericardium: There is no evidence of pericardial effusion. Mitral Valve: The mitral valve is normal in structure. Trivial mitral valve regurgitation. No evidence of mitral valve stenosis. Tricuspid Valve: The tricuspid valve is normal in structure. Tricuspid valve regurgitation is not demonstrated. No evidence of tricuspid stenosis. Aortic Valve: The aortic valve is tricuspid. Aortic valve regurgitation is not visualized. No aortic stenosis is present. Pulmonic Valve: The pulmonic valve was grossly normal. Pulmonic valve regurgitation is not visualized. No evidence of pulmonic stenosis. Aorta: The aortic root is normal in size and structure. Venous: The inferior vena cava is normal in size with greater than 50% respiratory variability, suggesting right atrial pressure of 3 mmHg. IAS/Shunts: The interatrial septum was not well visualized.  LEFT VENTRICLE PLAX 2D LVIDd:         4.50 cm     Diastology LVIDs:  3.20 cm     LV e' medial:    9.14 cm/s LV PW:         1.30 cm     LV E/e' medial:  8.6 LV IVS:        1.10 cm     LV e' lateral:   11.30 cm/s LVOT diam:     2.70 cm     LV E/e' lateral: 7.0 LV SV:         111 LV SV Index:   45 LVOT Area:     5.73 cm  LV Volumes (MOD) LV vol d, MOD A2C: 48.0 ml LV vol d, MOD A4C: 59.4 ml LV vol s, MOD A2C: 16.6 ml LV vol s, MOD A4C: 25.4 ml LV SV MOD A2C:     31.4 ml LV SV MOD  A4C:     59.4 ml LV SV MOD BP:      32.8 ml RIGHT VENTRICLE             IVC RV S prime:     13.10 cm/s  IVC diam: 1.90 cm TAPSE (M-mode): 1.7 cm LEFT ATRIUM             Index        RIGHT ATRIUM           Index LA diam:        3.60 cm 1.46 cm/m   RA Area:     12.30 cm LA Vol (A2C):   23.5 ml 9.54 ml/m   RA Volume:   26.20 ml  10.64 ml/m LA Vol (A4C):   34.9 ml 14.17 ml/m LA Biplane Vol: 29.0 ml 11.78 ml/m  AORTIC VALVE LVOT Vmax:   112.00 cm/s LVOT Vmean:  65.600 cm/s LVOT VTI:    0.194 m  AORTA Ao Root diam: 3.40 cm Ao Asc diam:  3.30 cm MITRAL VALVE MV Area (PHT): 3.66 cm    SHUNTS MV Decel Time: 208 msec    Systemic VTI:  0.19 m MV E velocity: 79.00 cm/s  Systemic Diam: 2.70 cm MV A velocity: 62.70 cm/s MV E/A ratio:  1.26 Olga Millers MD Electronically signed by Olga Millers MD Signature Date/Time: 06/23/2023/3:34:29 PM    Final    CT Angio Chest PE W and/or Wo Contrast  Result Date: 06/22/2023 CLINICAL DATA:  Recent diagnosis left testicular cancer with orchiectomy 05/02/2023, imaging guided right chest port insertion 06/18/2023, presents with dyspnea and chest wall pain with suspected DVT. EXAM: CT ANGIOGRAPHY CHEST WITH CONTRAST TECHNIQUE: Multidetector CT imaging of the chest was performed using the standard protocol during bolus administration of intravenous contrast. Multiplanar CT image reconstructions and MIPs were obtained to evaluate the vascular anatomy. RADIATION DOSE REDUCTION: This exam was performed according to the departmental dose-optimization program which includes automated exposure control, adjustment of the mA and/or kV according to patient size and/or use of iterative reconstruction technique. CONTRAST:  75mL OMNIPAQUE IOHEXOL 350 MG/ML SOLN COMPARISON:  Chest, abdomen pelvis CT with IV contrast 04/28/2023, MRI abdomen earlier today. FINDINGS: Cardiovascular: Interval new insertion of a right chest port with IJ approach catheter terminating at about the superior cavoatrial  junction. The cardiac size is upper limits normal. There is no pericardial effusion. No visible coronary calcifications. The aorta, great vessels, and pulmonary veins are normal. The pulmonary arteries are within normal caliber limits. There is hypodense acute thrombus, which grossly appears nonoccluding, in the right lower lobe main artery with extension into at least 2 posterior basal  segmental arteries and with scattered thrombus in some of the downstream subsegmental posterior basilar arteries, some of which does appear occluding within the smaller arteries. In the right upper lobe, additional nonocclusive thrombus is seen in the anterior segmental artery with extension into 1 downstream subsegmental branch. No other arterial embolic disease is seen. Overall this is a small clot burden without findings of right heart strain. Mediastinum/Nodes: Mildly enlarged bilateral hilar lymph nodes are again noted, largest on the right is 1.5 cm short axis on 4:57, unchanged. Largest on the left 1.3 cm short axis on 4:67, stable. There are shotty prevascular space nodes which were also noted previously. Largest of these is 8 mm in short axis on 4:53, with a subcarinal nodal complex measuring 1.5 cm short axis, slightly larger than previously. There is no other mediastinal adenopathy. No thyroid mass. Bilateral axillary adenopathy is redemonstrated. Again the largest right axillary node is 2.3 x 2.7 cm on 4:45, and the largest on the left 3.2 x 1.8 cm on 4:55, all unchanged. The thoracic trachea, main bronchi and thoracic esophagus are unremarkable. Lungs/Pleura: There is patchy consolidation in the posterior basal right lower lobe which is probably related to 1 or more evolving infarctions, interspersed with ground-glass disease. Underlying pneumonia is possible in the proper clinical setting. There is increased atelectasis in the posterior left lower lobe superior segment and in the medial segment of the right middle lobe.  The lungs are otherwise clear. There is no pleural effusion, thickening or pneumothorax. Upper Abdomen: Fatty liver with mild hepatosplenomegaly, splenic coronal diameter 16 cm, previously also 16 cm, with again noted 2.1 cm stone in the distal gallbladder. No acute upper abdominal abnormality. Stable bilateral adrenal adenomas. Mild elevation right hemidiaphragm. Musculoskeletal: No chest wall abnormality. No acute or significant osseous findings. Review of the MIP images confirms the above findings. IMPRESSION: 1. Acute pulmonary emboli in the right lower lobe main artery with extension into at least 2 posterior basal segmental arteries, scattered thrombus in some of the downstream subsegmental posterior basilar arteries, some of which does appear occluding within the smaller arteries. 2. Additional nonocclusive thrombus in the right upper lobe anterior segmental artery with extension into 1 downstream subsegmental branch. 3. Overall this is a small clot burden without findings of acute right heart strain. 4. Patchy consolidation in the posterior basal right lower lobe which is probably related to 1 or more evolving infarctions, interspersed with ground-glass disease. Underlying pneumonia is possible in the proper clinical setting. 5. Stable bilateral axillary adenopathy. Stable mild bihilar adenopathy. 6. Stable fatty liver with mild hepatosplenomegaly. Cholelithiasis. Stable bilateral adrenal adenomas. 7. Critical Value/emergent results were called by telephone at the time of interpretation on 06/22/2023 at 10:06 pm to provider ABIGAIL HARRIS , who verbally acknowledged these results. Electronically Signed   By: Almira Bar M.D.   On: 06/22/2023 22:18   MR Abdomen W Wo Contrast  Result Date: 06/22/2023 CLINICAL DATA:  Characterize right adrenal mass, new diagnosis testicular cancer EXAM: MRI ABDOMEN WITHOUT AND WITH CONTRAST TECHNIQUE: Multiplanar multisequence MR imaging of the abdomen was performed both  before and after the administration of intravenous contrast. CONTRAST:  10mL GADAVIST GADOBUTROL 1 MMOL/ML IV SOLN COMPARISON:  CT chest abdomen pelvis, 04/28/2023 FINDINGS: Lower chest: No acute abnormality. Hepatobiliary: No solid liver abnormality is seen. Hepatomegaly, maximum coronal span 20.0 cm. Gallstone. No gallbladder wall thickening, or biliary dilatation. Pancreas: Unremarkable. No pancreatic ductal dilatation or surrounding inflammatory changes. Spleen: Splenomegaly, maximum span 16.0 cm. Adrenals/Urinary Tract:  Definitively benign, macroscopic fat containing bilateral adrenal adenomata (series 6, image 38). Kidneys are normal, without renal calculi, solid lesion, or hydronephrosis. Stomach/Bowel: Stomach is within normal limits. No evidence of bowel wall thickening, distention, or inflammatory changes. Vascular/Lymphatic: No significant vascular findings are present. No enlarged abdominal lymph nodes. Other: No abdominal wall hernia or abnormality. No ascites. Musculoskeletal: No acute or significant osseous findings. IMPRESSION: 1. Definitively benign, macroscopic fat containing bilateral adrenal adenomata, for which no specific further follow-up or characterization is required. 2. No evidence of lymphadenopathy or metastatic disease in the abdomen. 3. Cholelithiasis. 4. Hepatosplenomegaly. Electronically Signed   By: Jearld Lesch M.D.   On: 06/22/2023 17:21   VAS [place] LOWER EXTREMITY VENOUS (DVT)  Result Date: 06/21/2023  Lower Venous DVT Study Patient Name:  Steve Stewart  Date of Exam:   06/21/2023 Medical Rec #: [id number]     Accession #:    [id number] Date of Birth: 29-May-1991     Patient Gender: M Patient Age:   38 years Exam Location:  [facility] Procedure:      VAS [place] LOWER EXTREMITY VENOUS (DVT) Referring Phys: Artemis Loyal --------------------------------------------------------------------------------  Indications: Pain.  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 Limitations: Patient  tension. Comparison Study: No prior study Performing Technologist: Shona Simpson  Examination Guidelines: A complete evaluation includes B-mode imaging, spectral Doppler, color Doppler, and power Doppler as needed of all accessible portions of each vessel. Bilateral testing is considered an integral part of a complete examination. Limited examinations for reoccurring indications may be performed as noted. The reflux portion of the exam is performed with the patient in reverse Trendelenburg.  +-----+---------------+---------+-----------+----------+---------------+ RIGHTCompressibilityPhasicitySpontaneityPropertiesThrombus Aging  +-----+---------------+---------+-----------+----------+---------------+ CFV                 Yes      Yes                  Patent by color +-----+---------------+---------+-----------+----------+---------------+   +---------+---------------+---------+-----------+----------+---------------+ LEFT     CompressibilityPhasicitySpontaneityPropertiesThrombus Aging  +---------+---------------+---------+-----------+----------+---------------+ CFV      Full           Yes      Yes                                  +---------+---------------+---------+-----------+----------+---------------+ SFJ      Full                                                         +---------+---------------+---------+-----------+----------+---------------+ FV Prox  Full                                                         +---------+---------------+---------+-----------+----------+---------------+ FV Mid   Full                                                         +---------+---------------+---------+-----------+----------+---------------+ FV DistalFull                                                         +---------+---------------+---------+-----------+----------+---------------+  PFV                                                   Patent by color  +---------+---------------+---------+-----------+----------+---------------+ POP      None           No       No                                   +---------+---------------+---------+-----------+----------+---------------+ PTV      None                                                         +---------+---------------+---------+-----------+----------+---------------+ PERO     None                                                         +---------+---------------+---------+-----------+----------+---------------+ Soleal   Full                                                         +---------+---------------+---------+-----------+----------+---------------+ Gastroc  Full                                                         +---------+---------------+---------+-----------+----------+---------------+     Summary: RIGHT: - No evidence of common femoral vein obstruction.   LEFT: - Findings consistent with acute deep vein thrombosis involving the left popliteal vein, left posterior tibial veins, and left peroneal veins.  - No cystic structure found in the popliteal fossa.  *See table(s) above for measurements and observations. Electronically signed by Heath Lark on 06/21/2023 at 4:18:20 PM.    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 EXAM: IMPLANTED PORT A CATH PLACEMENT WITH ULTRASOUND AND FLUOROSCOPIC GUIDANCE ANESTHESIA/SEDATION: Moderate (conscious) sedation was employed during this procedure. A total of Versed 4.0 mg and Fentanyl 100 mcg was administered intravenously. Moderate Sedation Time: 35 minutes. The patient's level of consciousness and vital signs were monitored continuously by radiology nursing throughout the procedure under my direct supervision. FLUOROSCOPY: 43 seconds.  6.0 mGy. PROCEDURE: The procedure, risks, benefits, and  alternatives were explained to the patient. Questions regarding the procedure were encouraged and answered. The patient understands and consents to the procedure. A time-out was performed prior to initiating the procedure. Ultrasound was utilized to confirm patency of the right internal jugular vein. An ultrasound image was saved and recorded. The right neck and chest were prepped with chlorhexidine in a sterile fashion, and a  sterile drape was applied covering the operative field. Maximum barrier sterile technique with sterile gowns and gloves were used for the procedure. Local anesthesia was provided with 1% lidocaine. After creating a small venotomy incision, a 21 gauge needle was advanced into the right internal jugular vein under direct, real-time ultrasound guidance. Ultrasound image documentation was performed. After securing guidewire access, an 8 Fr dilator was placed. A J-wire was kinked to measure appropriate catheter length. A subcutaneous port pocket was then created along the upper chest wall utilizing sharp and blunt dissection. Portable cautery was utilized. The pocket was irrigated with sterile saline. A single lumen power injectable port was chosen for placement. The 8 Fr catheter was tunneled from the port pocket site to the venotomy incision. The port was placed in the pocket. External catheter was trimmed to appropriate length based on guidewire measurement. At the venotomy, an 8 Fr peel-away sheath was placed over a guidewire. The catheter was then placed through the sheath and the sheath removed. Final catheter positioning was confirmed and documented with a fluoroscopic spot image. The port was accessed with a needle and aspirated and flushed with heparinized saline. The access needle was removed. The venotomy and port pocket incisions were closed with subcutaneous 3-0 Monocryl and subcuticular 4-0 Vicryl. Dermabond was applied to both incisions. COMPLICATIONS: COMPLICATIONS None FINDINGS:  After catheter placement, the tip lies at the cavo-atrial junction. The catheter aspirates normally and is ready for immediate use. IMPRESSION: Placement of single lumen port a cath via right internal jugular vein. The catheter tip lies at the cavo-atrial junction. A power injectable port a cath was placed and is ready for immediate use. Electronically Signed   By: Irish Lack M.D.   On: 06/18/2023 16:19

## 2023-06-25 NOTE — Plan of Care (Signed)
Pt and mom given discharge instructions. Pt and mom understood instructions. Vitals , IV sites in tact, patient knows to follow up with PCP. Pt discharged by private vehicle.  Problem: Education: Goal: Knowledge of General Education information will improve Description: Including pain rating scale, medication(s)/side effects and non-pharmacologic comfort measures Outcome: Adequate for Discharge   Problem: Health Behavior/Discharge Planning: Goal: Ability to manage health-related needs will improve Outcome: Adequate for Discharge   Problem: Clinical Measurements: Goal: Ability to maintain clinical measurements within normal limits will improve Outcome: Adequate for Discharge Goal: Will remain free from infection Outcome: Adequate for Discharge Goal: Diagnostic test results will improve Outcome: Adequate for Discharge Goal: Respiratory complications will improve Outcome: Adequate for Discharge Goal: Cardiovascular complication will be avoided Outcome: Adequate for Discharge   Problem: Activity: Goal: Risk for activity intolerance will decrease Outcome: Adequate for Discharge   Problem: Nutrition: Goal: Adequate nutrition will be maintained Outcome: Adequate for Discharge   Problem: Coping: Goal: Level of anxiety will decrease Outcome: Adequate for Discharge   Problem: Elimination: Goal: Will not experience complications related to bowel motility Outcome: Adequate for Discharge Goal: Will not experience complications related to urinary retention Outcome: Adequate for Discharge   Problem: Pain Management: Goal: General experience of comfort will improve Outcome: Adequate for Discharge   Problem: Safety: Goal: Ability to remain free from injury will improve Outcome: Adequate for Discharge   Problem: Skin Integrity: Goal: Risk for impaired skin integrity will decrease Outcome: Adequate for Discharge

## 2023-06-25 NOTE — Progress Notes (Addendum)
PHARMACY - ANTICOAGULATION CONSULT NOTE  Pharmacy Consult for Heparin Indication:  DVT and PE  No Known Allergies  Patient Measurements: Height: 5\' 11"  (180.3 cm) Weight: 130.7 kg (288 lb 2.3 oz) IBW/kg (Calculated) : 75.3 Heparin Dosing Weight: 105.1kg  Vital Signs: Temp: 97.7 F (36.5 C) (11/04 0626) Temp Source: Oral (11/04 0626) BP: 116/72 (11/04 0626) Pulse Rate: 59 (11/04 0626)  Labs: Recent Labs    06/22/23 2030 06/22/23 2030 06/22/23 2237 06/23/23 0500 06/23/23 1245 06/23/23 1936 06/24/23 0613 06/25/23 0518  HGB 14.3  --   --  12.8*  --   --  12.8* 13.1  HCT 43.1  --   --  38.7*  --   --  38.1* 40.2  PLT 105*  --   --  101*  --   --  105* 125*  APTT  --    < > 33  --  45* 62* 75*  --   HEPARINUNFRC  --   --  >1.10*  --   --   --  0.46 0.40  CREATININE 0.83  --   --  0.78  --   --   --   --    < > = values in this interval not displayed.    Estimated Creatinine Clearance: 179.4 mL/min (by C-G formula based on SCr of 0.78 mg/dL).   Medical History: Past Medical History:  Diagnosis Date   Medical history non-contributory    Assessment:  AC/Heme: recent DVT on 06/21/23 on Eliquis PTA --> heparin drip -CTA chest w/ +PE - Hep level 0.4 in goal. Hgb 13.1, Plts 125 up slightly.  Goal of Therapy:  Heparin level 0.3-0.7 units/ml Monitor platelets by anticoagulation protocol: Yes   Plan:  Con't IV heparin at 1900 units/hr Daily HL and CBC   Khaalid Lefkowitz S. Merilynn Finland, PharmD, BCPS Clinical Staff Pharmacist [email or web address] Merilynn Finland, Timaya Bojarski Stillinger 06/25/2023,8:12 AM

## 2023-06-25 NOTE — TOC Benefit Eligibility Note (Signed)
Patient Product/process development scientist completed.    The patient is insured through [employer]. Patient has [employer], may use a copay card, and/or apply for patient assistance if available.    Ran test claim for Eliquis 5 mg and the current 30 day co-pay is $567.26 due to a $6200 deductilbe.   This test claim was processed through [facility]- copay amounts may vary at other pharmacies due to pharmacy/plan contracts, or as the patient moves through the different stages of their insurance plan.     Roland Earl, CPHT Pharmacy Technician III Certified Patient Advocate [facility] Patient Advocate Team Direct Number: [phone]  Fax: [phone]

## 2023-06-25 NOTE — Progress Notes (Signed)
PROGRESS NOTE    Steve Stewart  ZOX:[id number] DOB: 1990-09-17 DOA: 06/22/2023 PCP: Patient, No Pcp Per   Brief Narrative:  Patient is a 32 year old male with past medical history significant for testicular cancer, recent diagnosis of left lower extremity DVT (on 06/21/2023: And was started on Eliquis).    Patient was admitted with worsening shortness of breath and pleuritic chest pain.  CTA chest revealed extensive acute pulmonary embolism.   Screening HIV test resulted positive.  ID consulted.  Started on heparin gtt.   Assessment & Plan:   Principal Problem:   Acute pulmonary embolism (HCC)  Acute pulmonary embolism in the setting of testicular cancer, no right heart strain per CT scan: recently diagnosed LE DVT -Eliquis is on hold.  Patient is currently on heparin drip. -Echo does not show strain. -Patient will be transitioned to DOAC once biopsy done or ruled out. - heme/onc following   Possible pneumonia seen on CT scan -Procalcitonin is less than 0.1. -Findings are likely related to acute pulmonary embolism. -Will discontinue azithromycin and ceftriaxone. -Continue to monitor patient closely.     Recent diagnosis of testicular cancer Will follow-up with oncology outpatient - CT abd/pelvis pending with possible lymph node biopsy by IR   Mild hypovolemic hyponatremia- Serum sodium 133 Renal panel in AM.  Positive screening HIV test: -CD4 and HIV RNA testing pending -Infectious disease team has been consulted.  - CT abdomen, pelvis pending.   DVT prophylaxis: Patient is on heparin drip Code Status: Full code. Family Communication:  none at bedside Disposition Plan: Home  Consultants:  Infectious disease. Heme/onc IR  Procedures:  None.  Antimicrobials:  Discontinue Rocephin. Discontinue ceftriaxone.   Subjective: Feeling well overall and wants to go home. Denies shortness of breath. No chest pain.  Objective: Vitals:   06/24/23 0120 06/24/23 0636  06/24/23 2127 06/25/23 0626  BP: 118/74 117/66 117/72 116/72  Pulse: 86 75 72 (!) 59  Resp: 20 20 18 20   Temp: 98.8 F (37.1 C) 97.8 F (36.6 C) 98.6 F (37 C) 97.7 F (36.5 C)  TempSrc: Oral Oral Oral Oral  SpO2: 96% 96% 98% 97%  Weight:      Height:        Intake/Output Summary (Last 24 hours) at 06/25/2023 0807 Last data filed at 06/24/2023 1757 Gross per 24 hour  Intake 817 ml  Output --  Net 817 ml   Filed Weights   06/23/23 1413  Weight: 130.7 kg    Examination:  General exam: Appears calm and comfortable.  Patient is morbidly obese. Respiratory system: Clear to auscultation. Cardiovascular system: S1 & S2 heard Gastrointestinal system: Abdomen is soft and nontender.  Central nervous system: Alert and oriented. No focal neurological deficits. Extremities: No leg edema.  Data Reviewed: I have personally reviewed following labs and imaging studies  CBC: Recent Labs  Lab 06/20/23 0846 06/22/23 2030 06/23/23 0500 06/24/23 0613 06/25/23 0518  WBC 6.1 8.0 6.2 5.3 5.1  NEUTROABS 3.2 4.5  --   --   --   HGB 14.9 14.3 12.8* 12.8* 13.1  HCT 44.7 43.1 38.7* 38.1* 40.2  MCV 85.3 86.2 86.2 85.2 86.8  PLT 97* 105* 101* 105* 125*   Basic Metabolic Panel: Recent Labs  Lab 06/20/23 0846 06/22/23 2030 06/23/23 0500  NA 137 127* 133*  K 4.1 4.7 4.0  CL 104 95* 103  CO2 26 22 23   GLUCOSE 88 99 100*  BUN 16 14 12   CREATININE 1.06 0.83 0.78  CALCIUM  9.1 9.2 8.5*  MG  --   --  2.1  PHOS  --   --  3.7   GFR: Estimated Creatinine Clearance: 179.4 mL/min (by C-G formula based on SCr of 0.78 mg/dL). Liver Function Tests:  Radiology Studies: ECHOCARDIOGRAM COMPLETE  Result Date: 06/23/2023    ECHOCARDIOGRAM REPORT   Patient Name:   Steve Stewart Date of Exam: 06/23/2023 Medical Rec #:  [id number]    Height:       71.0 in Accession #:    [id number]   Weight:       288.1 lb Date of Birth:  1990-12-17    BSA:          2.463 m Patient Age:    32 years     BP:            115/85 mmHg Patient Gender: M            HR:           90 bpm. Exam Location:  Inpatient Procedure: 2D Echo, Cardiac Doppler and Color Doppler Indications:    I26.02 Pulmonary embolus  History:        Patient has no prior history of Echocardiogram examinations.                 Signs/Symptoms:Shortness of Breath and Dyspnea. Cancer.  Sonographer:    Sheralyn Boatman RDCS Referring Phys: [id number] CAROLE N HALL  Sonographer Comments: Technically difficult study due to poor echo windows. Patient supine due to pain. IMPRESSIONS  1. Left ventricular ejection fraction, by estimation, is 60 to 65%. The left ventricle has normal function. The left ventricle has no regional wall motion abnormalities. There is mild concentric left ventricular hypertrophy. Left ventricular diastolic parameters were normal.  2. Right ventricular systolic function is normal. The right ventricular size is normal.  3. The mitral valve is normal in structure. Trivial mitral valve regurgitation. No evidence of mitral stenosis.  4. The aortic valve is tricuspid. Aortic valve regurgitation is not visualized. No aortic stenosis is present.  5. The inferior vena cava is normal in size with greater than 50% respiratory variability, suggesting right atrial pressure of 3 mmHg. Comparison(s): No prior Echocardiogram. FINDINGS  Left Ventricle: Left ventricular ejection fraction, by estimation, is 60 to 65%. The left ventricle has normal function. The left ventricle has no regional wall motion abnormalities. The left ventricular internal cavity size was normal in size. There is  mild concentric left ventricular hypertrophy. Left ventricular diastolic parameters were normal. Right Ventricle: The right ventricular size is normal. Right ventricular systolic function is normal. Left Atrium: Left atrial size was normal in size. Right Atrium: Right atrial size was normal in size. Pericardium: There is no evidence of pericardial effusion. Mitral Valve: The mitral valve is  normal in structure. Trivial mitral valve regurgitation. No evidence of mitral valve stenosis. Tricuspid Valve: The tricuspid valve is normal in structure. Tricuspid valve regurgitation is not demonstrated. No evidence of tricuspid stenosis. Aortic Valve: The aortic valve is tricuspid. Aortic valve regurgitation is not visualized. No aortic stenosis is present. Pulmonic Valve: The pulmonic valve was grossly normal. Pulmonic valve regurgitation is not visualized. No evidence of pulmonic stenosis. Aorta: The aortic root is normal in size and structure. Venous: The inferior vena cava is normal in size with greater than 50% respiratory variability, suggesting right atrial pressure of 3 mmHg. IAS/Shunts: The interatrial septum was not well visualized.  LEFT VENTRICLE PLAX 2D LVIDd:  4.50 cm     Diastology LVIDs:         3.20 cm     LV e' medial:    9.14 cm/s LV PW:         1.30 cm     LV E/e' medial:  8.6 LV IVS:        1.10 cm     LV e' lateral:   11.30 cm/s LVOT diam:     2.70 cm     LV E/e' lateral: 7.0 LV SV:         111 LV SV Index:   45 LVOT Area:     5.73 cm  LV Volumes (MOD) LV vol d, MOD A2C: 48.0 ml LV vol d, MOD A4C: 59.4 ml LV vol s, MOD A2C: 16.6 ml LV vol s, MOD A4C: 25.4 ml LV SV MOD A2C:     31.4 ml LV SV MOD A4C:     59.4 ml LV SV MOD BP:      32.8 ml RIGHT VENTRICLE             IVC RV S prime:     13.10 cm/s  IVC diam: 1.90 cm TAPSE (M-mode): 1.7 cm LEFT ATRIUM             Index        RIGHT ATRIUM           Index LA diam:        3.60 cm 1.46 cm/m   RA Area:     12.30 cm LA Vol (A2C):   23.5 ml 9.54 ml/m   RA Volume:   26.20 ml  10.64 ml/m LA Vol (A4C):   34.9 ml 14.17 ml/m LA Biplane Vol: 29.0 ml 11.78 ml/m  AORTIC VALVE LVOT Vmax:   112.00 cm/s LVOT Vmean:  65.600 cm/s LVOT VTI:    0.194 m  AORTA Ao Root diam: 3.40 cm Ao Asc diam:  3.30 cm MITRAL VALVE MV Area (PHT): 3.66 cm    SHUNTS MV Decel Time: 208 msec    Systemic VTI:  0.19 m MV E velocity: 79.00 cm/s  Systemic Diam: 2.70 cm MV A  velocity: 62.70 cm/s MV E/A ratio:  1.26 Olga Millers MD Electronically signed by Olga Millers MD Signature Date/Time: 06/23/2023/3:34:29 PM    Final      Scheduled Meds:  nicotine  7 mg Transdermal Daily   Continuous Infusions:  heparin 1,900 Units/hr (06/24/23 2137)     LOS: 3 days    Time spent: 35 minutes.    Leeroy Bock, MD  [facility] 7PM-7AM contact night coverage

## 2023-06-25 NOTE — Plan of Care (Signed)
  Problem: Education: Goal: Knowledge of General Education information will improve Description: Including pain rating scale, medication(s)/side effects and non-pharmacologic comfort measures Outcome: Progressing   Problem: Health Behavior/Discharge Planning: Goal: Ability to manage health-related needs will improve Outcome: Progressing   Problem: Clinical Measurements: Goal: Respiratory complications will improve Outcome: Progressing   Problem: Activity: Goal: Risk for activity intolerance will decrease Outcome: Progressing   Problem: Nutrition: Goal: Adequate nutrition will be maintained Outcome: Progressing   Problem: Elimination: Goal: Will not experience complications related to urinary retention Outcome: Progressing   Problem: Skin Integrity: Goal: Risk for impaired skin integrity will decrease Outcome: Progressing   Problem: Coping: Goal: Level of anxiety will decrease Outcome: Not Progressing

## 2023-06-26 ENCOUNTER — Telehealth: Payer: Self-pay

## 2023-06-26 ENCOUNTER — Telehealth (HOSPITAL_COMMUNITY): Payer: Self-pay

## 2023-06-26 ENCOUNTER — Ambulatory Visit: Payer: Self-pay

## 2023-06-26 NOTE — Telephone Encounter (Signed)
Spoke with Steve Stewart, discussed confirmatory testing. He does not have any questions at this time. Encouraged him to reach out if he has any questions or concerns between now and his appointment.   Call transferred to referral coordinator for scheduling.   Sandie Ano, RN

## 2023-06-26 NOTE — Discharge Summary (Signed)
Physician Discharge Summary  Patient: Steve Stewart BJY:[id number] DOB: 01/04/1991   Code Status: Prior Admit date: 06/22/2023 Discharge date: 06/25/2023 Disposition: Home, No home health services recommended PCP: Patient, No Pcp Per  Recommendations for Outpatient Follow-up:  Follow up with PCP within 1-2 weeks Regarding general hospital follow up and preventative care Follow up with oncology Regarding workup and treatment of testicular cancer DVT, PE Follow up with ID Regarding new HIV diagnosis  Discharge Diagnoses:  Principal Problem:   Acute pulmonary embolism (HCC) Active Problems:   Hyponatremia   Malignant neoplasm of descended testis [facility])   Regional lymph node metastasis present [facility])  Brief Hospital Course Summary: Patient is a 32 year old male with past medical history significant for testicular cancer, recent diagnosis of left lower extremity DVT (on 06/21/2023: And was started on Eliquis).     Patient was admitted with worsening shortness of breath and pleuritic chest pain.  CTA chest revealed extensive acute pulmonary embolism.   Screening HIV test resulted positive.   PE- patient has been stable ORA throughout admission. He was on heparin gtt >48 hours and then transitioned to eliquis. He already had starter pack at home from the previous DVT diagnosis.   Testicular cancer- pulmonary nodules- oncology recommended CT scan to further differentiate the new nodes followed by biopsy but patient elected to discharge prior to the results of the CT chest and did not receive the biopsy. He will follow up outpatient with Dr. Cherly Hensen.   Routine screening of HIV returned positive.  ID consulted. Patient denied risk factors or symptoms. Confirmatory testing was pending at time of discharge.  He will follow up with ID outpatient.   Discharge Condition: Stable, improved Recommended discharge diet: Regular healthy  diet  Consultations: Heme/onc IR ID  Procedures/Studies: CTA chest  Discharge Instructions     Discharge patient   Complete by: As directed    Discharge disposition: 01-Home or Self Care   Discharge patient date: 06/25/2023      Allergies as of 06/25/2023   No Known Allergies      Medication List     STOP taking these medications    oxyCODONE-acetaminophen 5-325 MG tablet Commonly known as: Percocet       TAKE these medications    acetaminophen 650 MG CR tablet Commonly known as: TYLENOL Take 1,300 mg by mouth every 8 (eight) hours as needed for pain.   albuterol 108 (90 Base) MCG/ACT inhaler Commonly known as: VENTOLIN HFA Inhale 1 puff into the lungs every 6 (six) hours as needed for wheezing or shortness of breath.   dexamethasone 4 MG tablet Commonly known as: DECADRON Take 2 tablets (8 mg total) by mouth daily. Start the day after last cisplatin dose on day 6 of chemo x 3 days.Take with food.   Eliquis DVT/PE Starter Pack Generic drug: Apixaban Starter Pack (10mg  and 5mg ) Take as directed on package: start with two-5mg  tablets twice daily for 7 days. On day 8, switch to one-5mg  tablet twice daily.   lidocaine-prilocaine cream Commonly known as: EMLA Apply to affected area once   nicotine 7 mg/24hr patch Commonly known as: NICODERM CQ - dosed in mg/24 hr Place 1 patch (7 mg total) onto the skin daily for 28 days.   ondansetron 8 MG tablet Commonly known as: Zofran Take 1 tablet (8 mg total) by mouth every 8 (eight) hours as needed for nausea or vomiting. Start on the third day after last cisplatin dose on day 8 of chemo  prochlorperazine 10 MG tablet Commonly known as: COMPAZINE Take 1 tablet (10 mg total) by mouth every 6 (six) hours as needed for nausea or vomiting.        Follow-up Information     [name], MD. Schedule an appointment as soon as possible for a visit.   Specialty: Oncology Contact information: [address]  [place] [address] [phone]                Subjective   Pt reports no respiratory complaints. Denies bleeding. He has his eliquis prescription already. Has no complaints. Wants to go home.   All questions and concerns were addressed at time of discharge.  Objective  Blood pressure 112/68, pulse 66, temperature 98.2 F (36.8 C), temperature source Oral, resp. rate 20, height 5\' 11"  (1.803 m), weight 130.7 kg, SpO2 98%.   General: Pt is alert, awake, not in acute distress Cardiovascular: RRR, S1/S2 +, no rubs, no gallops Respiratory: CTA bilaterally, no wheezing, no rhonchi Abdominal: Soft, NT, ND, bowel sounds + Extremities: no edema, no cyanosis  The results of significant diagnostics from this hospitalization (including imaging, microbiology, ancillary and laboratory) are listed below for reference.   Imaging studies: CT ABDOMEN PELVIS W CONTRAST  Result Date: 06/25/2023 CLINICAL DATA:  Inpatient. Testicular seminoma. HIV. Restaging. * Tracking Code: BO * EXAM: CT ABDOMEN AND PELVIS WITH CONTRAST TECHNIQUE: Multidetector CT imaging of the abdomen and pelvis was performed using the standard protocol following bolus administration of intravenous contrast. RADIATION DOSE REDUCTION: This exam was performed according to the departmental dose-optimization program which includes automated exposure control, adjustment of the mA and/or kV according to patient size and/or use of iterative reconstruction technique. CONTRAST:  OMNIPAQUE IOHEXOL 300 MG/ML  SOLN COMPARISON:  06/22/2023 MRI abdomen. 04/28/2023 CT chest, abdomen and pelvis. FINDINGS: Lower chest: Solid peripheral basilar 2.0 cm right lower lobe pulmonary nodule (series 4/image 26), new from 04/28/2023 CT, similar to 06/22/2023 chest CT, favoring evolving pulmonary infarct related 2 recent diagnosis of acute pulmonary embolism. Additional patchy consolidation at the posterior right lung base seen on prior chest CT has  resolved. Superior approach central venous catheter tip seen at the cavoatrial junction. Hepatobiliary: Normal liver size. No liver mass. Cholelithiasis. No biliary ductal dilatation. Pancreas: Normal, with no mass or duct dilation. Spleen: Normal size. No mass. Adrenals/Urinary Tract: Stable 1.7 cm right adrenal and 1.6 cm left adrenal nodules characterized as benign adenomas on recent MRI. Normal kidneys with no hydronephrosis and no renal mass. Normal bladder. Stomach/Bowel: Normal non-distended stomach. Normal caliber small bowel with no small bowel wall thickening. Normal appendix. Normal large bowel with no diverticulosis, large bowel wall thickening or pericolonic fat stranding. Vascular/Lymphatic: Normal caliber abdominal aorta. Patent portal, splenic, hepatic and renal veins. Stable mildly enlarged 1.2 cm periportal node (series 2/image 28). Borderline mildly enlarged bilateral inguinal lymph nodes up to 1.4 cm on the left (series 2/image 96), not substantially changed. Stable bilateral external iliac lymphadenopathy up to 1.3 cm on the right (series 2/image 88). Stable mild bilateral common iliac adenopathy up to 1.2 cm on the left (series 2/image 66). Reproductive: Normal size prostate. Other: No pneumoperitoneum, ascites or focal fluid collection. Musculoskeletal: No aggressive appearing focal osseous lesions. Minimal lower thoracic spondylosis. IMPRESSION: 1. Stable mild periportal, bilateral inguinal, bilateral external iliac and bilateral common iliac lymphadenopathy. 2. No new or progressive metastatic disease in the abdomen or pelvis. 3. Solid peripheral basilar 2.0 cm right lower lobe pulmonary nodule, new from  04/28/2023 CT, similar to recent 06/22/2023 chest CT, favoring evolving pulmonary infarct related to recent diagnosis of acute pulmonary embolism. Additional patchy consolidation at the posterior right lung base seen on prior chest CT has resolved. 4. Cholelithiasis. 5. Stable bilateral  adrenal nodules characterized as benign adenomas on recent MRI, requiring no further imaging follow-up. Electronically Signed   By: Delbert Phenix M.D.   On: 06/25/2023 19:53   ECHOCARDIOGRAM COMPLETE  Result Date: 06/23/2023    ECHOCARDIOGRAM REPORT   Patient Name:   Steve Stewart Date of Exam: 06/23/2023 Medical Rec #:  [id number]    Height:       71.0 in Accession #:    [id number]   Weight:       288.1 lb Date of Birth:  Aug 12, 1991    BSA:          2.463 m Patient Age:    32 years     BP:           115/85 mmHg Patient Gender: M            HR:           90 bpm. Exam Location:  Inpatient Procedure: 2D Echo, Cardiac Doppler and Color Doppler Indications:    I26.02 Pulmonary embolus  History:        Patient has no prior history of Echocardiogram examinations.                 Signs/Symptoms:Shortness of Breath and Dyspnea. Cancer.  Sonographer:    Sheralyn Boatman RDCS Referring Phys: [id number] CAROLE N HALL  Sonographer Comments: Technically difficult study due to poor echo windows. Patient supine due to pain. IMPRESSIONS  1. Left ventricular ejection fraction, by estimation, is 60 to 65%. The left ventricle has normal function. The left ventricle has no regional wall motion abnormalities. There is mild concentric left ventricular hypertrophy. Left ventricular diastolic parameters were normal.  2. Right ventricular systolic function is normal. The right ventricular size is normal.  3. The mitral valve is normal in structure. Trivial mitral valve regurgitation. No evidence of mitral stenosis.  4. The aortic valve is tricuspid. Aortic valve regurgitation is not visualized. No aortic stenosis is present.  5. The inferior vena cava is normal in size with greater than 50% respiratory variability, suggesting right atrial pressure of 3 mmHg. Comparison(s): No prior Echocardiogram. FINDINGS  Left Ventricle: Left ventricular ejection fraction, by estimation, is 60 to 65%. The left ventricle has normal function. The left ventricle has  no regional wall motion abnormalities. The left ventricular internal cavity size was normal in size. There is  mild concentric left ventricular hypertrophy. Left ventricular diastolic parameters were normal. Right Ventricle: The right ventricular size is normal. Right ventricular systolic function is normal. Left Atrium: Left atrial size was normal in size. Right Atrium: Right atrial size was normal in size. Pericardium: There is no evidence of pericardial effusion. Mitral Valve: The mitral valve is normal in structure. Trivial mitral valve regurgitation. No evidence of mitral valve stenosis. Tricuspid Valve: The tricuspid valve is normal in structure. Tricuspid valve regurgitation is not demonstrated. No evidence of tricuspid stenosis. Aortic Valve: The aortic valve is tricuspid. Aortic valve regurgitation is not visualized. No aortic stenosis is present. Pulmonic Valve: The pulmonic valve was grossly normal. Pulmonic valve regurgitation is not visualized. No evidence of pulmonic stenosis. Aorta: The aortic root is normal in size and structure. Venous: The inferior vena cava is normal in size with greater than 50% respiratory  variability, suggesting right atrial pressure of 3 mmHg. IAS/Shunts: The interatrial septum was not well visualized.  LEFT VENTRICLE PLAX 2D LVIDd:         4.50 cm     Diastology LVIDs:         3.20 cm     LV e' medial:    9.14 cm/s LV PW:         1.30 cm     LV E/e' medial:  8.6 LV IVS:        1.10 cm     LV e' lateral:   11.30 cm/s LVOT diam:     2.70 cm     LV E/e' lateral: 7.0 LV SV:         111 LV SV Index:   45 LVOT Area:     5.73 cm  LV Volumes (MOD) LV vol d, MOD A2C: 48.0 ml LV vol d, MOD A4C: 59.4 ml LV vol s, MOD A2C: 16.6 ml LV vol s, MOD A4C: 25.4 ml LV SV MOD A2C:     31.4 ml LV SV MOD A4C:     59.4 ml LV SV MOD BP:      32.8 ml RIGHT VENTRICLE             IVC RV S prime:     13.10 cm/s  IVC diam: 1.90 cm TAPSE (M-mode): 1.7 cm LEFT ATRIUM             Index        RIGHT ATRIUM            Index LA diam:        3.60 cm 1.46 cm/m   RA Area:     12.30 cm LA Vol (A2C):   23.5 ml 9.54 ml/m   RA Volume:   26.20 ml  10.64 ml/m LA Vol (A4C):   34.9 ml 14.17 ml/m LA Biplane Vol: 29.0 ml 11.78 ml/m  AORTIC VALVE LVOT Vmax:   112.00 cm/s LVOT Vmean:  65.600 cm/s LVOT VTI:    0.194 m  AORTA Ao Root diam: 3.40 cm Ao Asc diam:  3.30 cm MITRAL VALVE MV Area (PHT): 3.66 cm    SHUNTS MV Decel Time: 208 msec    Systemic VTI:  0.19 m MV E velocity: 79.00 cm/s  Systemic Diam: 2.70 cm MV A velocity: 62.70 cm/s MV E/A ratio:  1.26 Olga Millers MD Electronically signed by Olga Millers MD Signature Date/Time: 06/23/2023/3:34:29 PM    Final    CT Angio Chest PE W and/or Wo Contrast  Result Date: 06/22/2023 CLINICAL DATA:  Recent diagnosis left testicular cancer with orchiectomy 05/02/2023, imaging guided right chest port insertion 06/18/2023, presents with dyspnea and chest wall pain with suspected DVT. EXAM: CT ANGIOGRAPHY CHEST WITH CONTRAST TECHNIQUE: Multidetector CT imaging of the chest was performed using the standard protocol during bolus administration of intravenous contrast. Multiplanar CT image reconstructions and MIPs were obtained to evaluate the vascular anatomy. RADIATION DOSE REDUCTION: This exam was performed according to the departmental dose-optimization program which includes automated exposure control, adjustment of the mA and/or kV according to patient size and/or use of iterative reconstruction technique. CONTRAST:  75mL OMNIPAQUE IOHEXOL 350 MG/ML SOLN COMPARISON:  Chest, abdomen pelvis CT with IV contrast 04/28/2023, MRI abdomen earlier today. FINDINGS: Cardiovascular: Interval new insertion of a right chest port with IJ approach catheter terminating at about the superior cavoatrial junction. The cardiac size is upper limits normal. There is no pericardial effusion.  No visible coronary calcifications. The aorta, great vessels, and pulmonary veins are normal. The pulmonary  arteries are within normal caliber limits. There is hypodense acute thrombus, which grossly appears nonoccluding, in the right lower lobe main artery with extension into at least 2 posterior basal segmental arteries and with scattered thrombus in some of the downstream subsegmental posterior basilar arteries, some of which does appear occluding within the smaller arteries. In the right upper lobe, additional nonocclusive thrombus is seen in the anterior segmental artery with extension into 1 downstream subsegmental branch. No other arterial embolic disease is seen. Overall this is a small clot burden without findings of right heart strain. Mediastinum/Nodes: Mildly enlarged bilateral hilar lymph nodes are again noted, largest on the right is 1.5 cm short axis on 4:57, unchanged. Largest on the left 1.3 cm short axis on 4:67, stable. There are shotty prevascular space nodes which were also noted previously. Largest of these is 8 mm in short axis on 4:53, with a subcarinal nodal complex measuring 1.5 cm short axis, slightly larger than previously. There is no other mediastinal adenopathy. No thyroid mass. Bilateral axillary adenopathy is redemonstrated. Again the largest right axillary node is 2.3 x 2.7 cm on 4:45, and the largest on the left 3.2 x 1.8 cm on 4:55, all unchanged. The thoracic trachea, main bronchi and thoracic esophagus are unremarkable. Lungs/Pleura: There is patchy consolidation in the posterior basal right lower lobe which is probably related to 1 or more evolving infarctions, interspersed with ground-glass disease. Underlying pneumonia is possible in the proper clinical setting. There is increased atelectasis in the posterior left lower lobe superior segment and in the medial segment of the right middle lobe. The lungs are otherwise clear. There is no pleural effusion, thickening or pneumothorax. Upper Abdomen: Fatty liver with mild hepatosplenomegaly, splenic coronal diameter 16 cm, previously  also 16 cm, with again noted 2.1 cm stone in the distal gallbladder. No acute upper abdominal abnormality. Stable bilateral adrenal adenomas. Mild elevation right hemidiaphragm. Musculoskeletal: No chest wall abnormality. No acute or significant osseous findings. Review of the MIP images confirms the above findings. IMPRESSION: 1. Acute pulmonary emboli in the right lower lobe main artery with extension into at least 2 posterior basal segmental arteries, scattered thrombus in some of the downstream subsegmental posterior basilar arteries, some of which does appear occluding within the smaller arteries. 2. Additional nonocclusive thrombus in the right upper lobe anterior segmental artery with extension into 1 downstream subsegmental branch. 3. Overall this is a small clot burden without findings of acute right heart strain. 4. Patchy consolidation in the posterior basal right lower lobe which is probably related to 1 or more evolving infarctions, interspersed with ground-glass disease. Underlying pneumonia is possible in the proper clinical setting. 5. Stable bilateral axillary adenopathy. Stable mild bihilar adenopathy. 6. Stable fatty liver with mild hepatosplenomegaly. Cholelithiasis. Stable bilateral adrenal adenomas. 7. Critical Value/emergent results were called by telephone at the time of interpretation on 06/22/2023 at 10:06 pm to provider ABIGAIL HARRIS , who verbally acknowledged these results. Electronically Signed   By: Almira Bar M.D.   On: 06/22/2023 22:18   MR Abdomen W Wo Contrast  Result Date: 06/22/2023 CLINICAL DATA:  Characterize right adrenal mass, new diagnosis testicular cancer EXAM: MRI ABDOMEN WITHOUT AND WITH CONTRAST TECHNIQUE: Multiplanar multisequence MR imaging of the abdomen was performed both before and after the administration of intravenous contrast. CONTRAST:  10mL GADAVIST GADOBUTROL 1 MMOL/ML IV SOLN COMPARISON:  CT chest abdomen pelvis, 04/28/2023  FINDINGS: Lower chest: No  acute abnormality. Hepatobiliary: No solid liver abnormality is seen. Hepatomegaly, maximum coronal span 20.0 cm. Gallstone. No gallbladder wall thickening, or biliary dilatation. Pancreas: Unremarkable. No pancreatic ductal dilatation or surrounding inflammatory changes. Spleen: Splenomegaly, maximum span 16.0 cm. Adrenals/Urinary Tract: Definitively benign, macroscopic fat containing bilateral adrenal adenomata (series 6, image 38). Kidneys are normal, without renal calculi, solid lesion, or hydronephrosis. Stomach/Bowel: Stomach is within normal limits. No evidence of bowel wall thickening, distention, or inflammatory changes. Vascular/Lymphatic: No significant vascular findings are present. No enlarged abdominal lymph nodes. Other: No abdominal wall hernia or abnormality. No ascites. Musculoskeletal: No acute or significant osseous findings. IMPRESSION: 1. Definitively benign, macroscopic fat containing bilateral adrenal adenomata, for which no specific further follow-up or characterization is required. 2. No evidence of lymphadenopathy or metastatic disease in the abdomen. 3. Cholelithiasis. 4. Hepatosplenomegaly. Electronically Signed   By: Jearld Lesch M.D.   On: 06/22/2023 17:21   VAS [place] LOWER EXTREMITY VENOUS (DVT)  Result Date: 06/21/2023  Lower Venous DVT Study Patient Name:  Steve Stewart  Date of Exam:   06/21/2023 Medical Rec #: [id number]     Accession #:    [id number] Date of Birth: Jul 13, 1991     Patient Gender: M Patient Age:   38 years Exam Location:  [facility] Procedure:      VAS [place] LOWER EXTREMITY VENOUS (DVT) Referring Phys: RUBENS CHANG --------------------------------------------------------------------------------  Indications: Pain.  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 Limitations: Patient tension. Comparison Study: No prior study Performing Technologist: Shona Simpson  Examination Guidelines: A complete evaluation includes B-mode imaging, spectral Doppler, color Doppler, and power  Doppler as needed of all accessible portions of each vessel. Bilateral testing is considered an integral part of a complete examination. Limited examinations for reoccurring indications may be performed as noted. The reflux portion of the exam is performed with the patient in reverse Trendelenburg.  +-----+---------------+---------+-----------+----------+---------------+ RIGHTCompressibilityPhasicitySpontaneityPropertiesThrombus Aging  +-----+---------------+---------+-----------+----------+---------------+ CFV                 Yes      Yes                  Patent by color +-----+---------------+---------+-----------+----------+---------------+   +---------+---------------+---------+-----------+----------+---------------+ LEFT     CompressibilityPhasicitySpontaneityPropertiesThrombus Aging  +---------+---------------+---------+-----------+----------+---------------+ CFV      Full           Yes      Yes                                  +---------+---------------+---------+-----------+----------+---------------+ SFJ      Full                                                         +---------+---------------+---------+-----------+----------+---------------+ FV Prox  Full                                                         +---------+---------------+---------+-----------+----------+---------------+ FV Mid   Full                                                         +---------+---------------+---------+-----------+----------+---------------+  FV DistalFull                                                         +---------+---------------+---------+-----------+----------+---------------+ PFV                                                   Patent by color +---------+---------------+---------+-----------+----------+---------------+ POP      None           No       No                                    +---------+---------------+---------+-----------+----------+---------------+ PTV      None                                                         +---------+---------------+---------+-----------+----------+---------------+ PERO     None                                                         +---------+---------------+---------+-----------+----------+---------------+ Soleal   Full                                                         +---------+---------------+---------+-----------+----------+---------------+ Gastroc  Full                                                         +---------+---------------+---------+-----------+----------+---------------+     Summary: RIGHT: - No evidence of common femoral vein obstruction.   LEFT: - Findings consistent with acute deep vein thrombosis involving the left popliteal vein, left posterior tibial veins, and left peroneal veins.  - No cystic structure found in the popliteal fossa.  *See table(s) above for measurements and observations. Electronically signed by Heath Lark on 06/21/2023 at 4:18:20 PM.    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 EXAM: IMPLANTED PORT A CATH PLACEMENT WITH ULTRASOUND AND FLUOROSCOPIC GUIDANCE ANESTHESIA/SEDATION: Moderate (conscious) sedation was employed during this procedure. A total of Versed 4.0 mg and Fentanyl 100 mcg was administered intravenously. Moderate Sedation Time: 35 minutes. The patient's level of consciousness and vital signs were monitored continuously by radiology nursing throughout the procedure under my direct supervision. FLUOROSCOPY: 43 seconds.  6.0 mGy. PROCEDURE: The procedure, risks, benefits, and alternatives were explained to the patient. Questions  regarding the procedure were encouraged and answered. The patient understands and consents to the  procedure. A time-out was performed prior to initiating the procedure. Ultrasound was utilized to confirm patency of the right internal jugular vein. An ultrasound image was saved and recorded. The right neck and chest were prepped with chlorhexidine in a sterile fashion, and a sterile drape was applied covering the operative field. Maximum barrier sterile technique with sterile gowns and gloves were used for the procedure. Local anesthesia was provided with 1% lidocaine. After creating a small venotomy incision, a 21 gauge needle was advanced into the right internal jugular vein under direct, real-time ultrasound guidance. Ultrasound image documentation was performed. After securing guidewire access, an 8 Fr dilator was placed. A J-wire was kinked to measure appropriate catheter length. A subcutaneous port pocket was then created along the upper chest wall utilizing sharp and blunt dissection. Portable cautery was utilized. The pocket was irrigated with sterile saline. A single lumen power injectable port was chosen for placement. The 8 Fr catheter was tunneled from the port pocket site to the venotomy incision. The port was placed in the pocket. External catheter was trimmed to appropriate length based on guidewire measurement. At the venotomy, an 8 Fr peel-away sheath was placed over a guidewire. The catheter was then placed through the sheath and the sheath removed. Final catheter positioning was confirmed and documented with a fluoroscopic spot image. The port was accessed with a needle and aspirated and flushed with heparinized saline. The access needle was removed. The venotomy and port pocket incisions were closed with subcutaneous 3-0 Monocryl and subcuticular 4-0 Vicryl. Dermabond was applied to both incisions. COMPLICATIONS: COMPLICATIONS None FINDINGS: After catheter placement, the tip lies at the cavo-atrial junction. The catheter aspirates normally and is ready for immediate use. IMPRESSION: Placement  of single lumen port a cath via right internal jugular vein. The catheter tip lies at the cavo-atrial junction. A power injectable port a cath was placed and is ready for immediate use. Electronically Signed   By: Irish Lack M.D.   On: 06/18/2023 16:19    Labs: Basic Metabolic Panel: Recent Labs  Lab 06/20/23 0846 06/22/23 2030 06/23/23 0500  NA 137 127* 133*  K 4.1 4.7 4.0  CL 104 95* 103  CO2 26 22 23   GLUCOSE 88 99 100*  BUN 16 14 12   CREATININE 1.06 0.83 0.78  CALCIUM 9.1 9.2 8.5*  MG  --   --  2.1  PHOS  --   --  3.7   CBC: Recent Labs  Lab 06/20/23 0846 06/22/23 2030 06/23/23 0500 06/24/23 0613 06/25/23 0518  WBC 6.1 8.0 6.2 5.3 5.1  NEUTROABS 3.2 4.5  --   --   --   HGB 14.9 14.3 12.8* 12.8* 13.1  HCT 44.7 43.1 38.7* 38.1* 40.2  MCV 85.3 86.2 86.2 85.2 86.8  PLT 97* 105* 101* 105* 125*   Lab Results  Component Value Date   HIV1RNAQUANT 59,200 06/23/2023  Reactive (11/02 0037)  Microbiology: Results for orders placed or performed during the hospital encounter of 06/22/23  Resp panel by RT-PCR (RSV, Flu A&B, Covid) Anterior Nasal Swab     Status: None   Collection Time: 06/22/23  9:05 PM   Specimen: Anterior Nasal Swab  Result Value Ref Range Status   SARS Coronavirus 2 by RT PCR NEGATIVE NEGATIVE Final    Comment: (NOTE) SARS-CoV-2 target nucleic acids are NOT DETECTED.  The SARS-CoV-2 RNA is generally detectable in upper respiratory  specimens during the acute phase of infection. The lowest concentration of SARS-CoV-2 viral copies this assay can detect is 138 copies/mL. A negative result does not preclude SARS-Cov-2 infection and should not be used as the sole basis for treatment or other patient management decisions. A negative result may occur with  improper specimen collection/handling, submission of specimen other than nasopharyngeal swab, presence of viral mutation(s) within the areas targeted by this assay, and inadequate number of  viral copies(<138 copies/mL). A negative result must be combined with clinical observations, patient history, and epidemiological information. The expected result is Negative.  Fact Sheet for Patients:  BloggerCourse.com  Fact Sheet for Healthcare Providers:  SeriousBroker.it  This test is no t yet approved or cleared by the [place] FDA and  has been authorized for detection and/or diagnosis of SARS-CoV-2 by FDA under an Emergency Use Authorization (EUA). This EUA will remain  in effect (meaning this test can be used) for the duration of the COVID-19 declaration under Section 564(b)(1) of the Act, 21 U.S.C.section 360bbb-3(b)(1), unless the authorization is terminated  or revoked sooner.       Influenza A by PCR NEGATIVE NEGATIVE Final   Influenza B by PCR NEGATIVE NEGATIVE Final    Comment: (NOTE) The Xpert Xpress SARS-CoV-2/FLU/RSV plus assay is intended as an aid in the diagnosis of influenza from Nasopharyngeal swab specimens and should not be used as a sole basis for treatment. Nasal washings and aspirates are unacceptable for Xpert Xpress SARS-CoV-2/FLU/RSV testing.  Fact Sheet for Patients: BloggerCourse.com  Fact Sheet for Healthcare Providers: SeriousBroker.it  This test is not yet approved or cleared by the [place] FDA and has been authorized for detection and/or diagnosis of SARS-CoV-2 by FDA under an Emergency Use Authorization (EUA). This EUA will remain in effect (meaning this test can be used) for the duration of the COVID-19 declaration under Section 564(b)(1) of the Act, 21 U.S.C. section 360bbb-3(b)(1), unless the authorization is terminated or revoked.     Resp Syncytial Virus by PCR NEGATIVE NEGATIVE Final    Comment: (NOTE) Fact Sheet for Patients: BloggerCourse.com  Fact Sheet for Healthcare  Providers: SeriousBroker.it  This test is not yet approved or cleared by the [place] FDA and has been authorized for detection and/or diagnosis of SARS-CoV-2 by FDA under an Emergency Use Authorization (EUA). This EUA will remain in effect (meaning this test can be used) for the duration of the COVID-19 declaration under Section 564(b)(1) of the Act, 21 U.S.C. section 360bbb-3(b)(1), unless the authorization is terminated or revoked.  Performed at [facility], [address], [place], [place] [address]      Time coordinating discharge: Over 30 minutes  Leeroy Bock, MD  [facility] 06/26/2023, 6:59 PM

## 2023-06-26 NOTE — Telephone Encounter (Addendum)
HIV-1 confirmatory testing (+). Called [facility] to discuss results and assist with linkage to care, no answer. Left HIPAA compliant voicemail requesting callback.    Latest Reference Range & Units 06/23/23 00:37 06/23/23 15:28  HIV Screen 4th Generation wRfx Non Reactive  Reactive !   HIV 1 Ab Non Reactive  Reactive   HIV 2 Ab Non Reactive  Indeterminate   Note  SEE COMMENT !   HIV 1 RNA Quant copies/mL  59,200 (C)  log10 HIV-1 RNA log10copy/mL  4.772  !: Data is abnormal (C): Corrected  Sandie Ano, RN

## 2023-06-27 ENCOUNTER — Telehealth: Payer: Self-pay

## 2023-06-27 ENCOUNTER — Other Ambulatory Visit (HOSPITAL_COMMUNITY): Payer: Self-pay

## 2023-06-27 ENCOUNTER — Ambulatory Visit: Payer: Self-pay

## 2023-06-27 LAB — T-HELPER CELLS (CD4) COUNT (NOT AT ARMC)

## 2023-06-27 NOTE — Progress Notes (Unsigned)
HPI: Steve Stewart is a 32 y.o. male who presents to the [facility] clinic for rapid initiation of ART for newly diagnosed HIV infection.  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   ALBUTEROL (VENTOLIN HFA) 108 (90 BASE) MCG/ACT INHALER    Inhale 1 puff into the lungs every 6 (six) hours as needed for wheezing or shortness of breath.   APIXABAN (ELIQUIS) VTE STARTER PACK (10MG  AND 5MG )    Take as directed on package: start with two-5mg  tablets twice daily for 7 days. On day 8, switch to one-5mg  tablet twice daily.   DEXAMETHASONE (DECADRON) 4 MG TABLET    Take 2 tablets (8 mg total) by mouth daily. Start the day after last cisplatin dose on day 6 of chemo x 3 days.Take with food.   LIDOCAINE-PRILOCAINE (EMLA) CREAM    Apply to affected area once   ONDANSETRON (ZOFRAN) 8 MG TABLET    Take 1 tablet (8 mg total) by mouth every 8 (eight) hours as needed for nausea or vomiting. Start on the third day after last cisplatin dose on day 8 of chemo   PROCHLORPERAZINE (COMPAZINE) 10 MG TABLET    Take 1 tablet (10 mg total) by mouth every 6 (six) hours as needed for nausea or vomiting.  Modified Medications   No medications on file  Discontinued Medications   No medications on file    Labs: Lab Results  Component Value Date   HIV1RNAQUANT 59,200 06/23/2023   CD4TABS  06/23/2023    CANCELED BY LAB, SPECIMEN DOES NOT MEET COLLECTION REQUIREMENTS    RPR and STI No results found for:  "LABRPR", "RPRTITER"      No data to display          Hepatitis B No results found for: "HEPBSAB", "HEPBSAG", "HEPBCAB" Hepatitis C No results found for: "HEPCAB", "HCVRNAPCRQN" Hepatitis A No results found for: "HAV" Lipids: No results found for: "CHOL", "TRIG", "HDL", "CHOLHDL", "VLDL", "LDLCALC"   Assessment: Steve Stewart presents to the clinic to establish care for their newly diagnosed HIV infection. Patient tested positive on 06/23/23 with a viral load of 59,200 copies per mL. His CD4 test was incomplete, and will likely be checked today. No labs available yet but will get them drawn today. Medications reviewed; no drug interactions were found. Will start Biktarvy.  Explained that Steve Stewart is a one pill once daily medication with or without food and the importance of not missing any doses. Explained resistance and how it develops and why it is so important to take Biktarvy daily and not skip days or doses. Counseled patient to take it around the same time each day. Counseled on what to do if dose is missed, if closer to missed dose take immediately, if closer to next dose then skip and resume normal schedule.   Cautioned on possible side effects the first week or so including nausea, diarrhea, dizziness, and headaches but that they should resolve after the first  couple of weeks. Counseled patient to separate Biktarvy from divalent cations including multivitamins. Discussed with patient to call clinic if Steve Stewart starts a new medication or herbal supplement. I gave the patient my card and told Steve Stewart to call me with any issues/questions/concerns.  Biktarvy samples for *** weeks were provided today. Steve Stewart is currently insured with [employer]. We will follow up copay costs with him.  Plan: - Biktarvy rapid start - Will follow up on lab work  Steve Stewart, PharmD PGY-2 Infectious Diseases Pharmacy Resident [facility] for Infectious Disease 06/27/2023 4:37 PM

## 2023-06-27 NOTE — Telephone Encounter (Signed)
[facility] Patient Product/process development scientist completed.    The patient is insured through [employer] and has a $3700.00 copay.  We will continue to follow to see if copay assistance is needed.  Clearance Coots, CPhT Specialty Pharmacy Patient [facility] for Infectious Disease Phone: [phone] Fax:  [phone]

## 2023-06-28 ENCOUNTER — Encounter: Payer: Self-pay | Admitting: Family

## 2023-06-28 ENCOUNTER — Ambulatory Visit: Payer: 59

## 2023-06-28 ENCOUNTER — Ambulatory Visit (INDEPENDENT_AMBULATORY_CARE_PROVIDER_SITE_OTHER): Payer: 59 | Admitting: Family

## 2023-06-28 ENCOUNTER — Ambulatory Visit (INDEPENDENT_AMBULATORY_CARE_PROVIDER_SITE_OTHER): Payer: 59 | Admitting: Pharmacist

## 2023-06-28 ENCOUNTER — Ambulatory Visit: Payer: Self-pay

## 2023-06-28 ENCOUNTER — Other Ambulatory Visit: Payer: Self-pay

## 2023-06-28 VITALS — BP 143/81 | HR 88 | Temp 97.9°F | Ht 71.0 in | Wt 251.0 lb

## 2023-06-28 DIAGNOSIS — B20 Human immunodeficiency virus [HIV] disease: Secondary | ICD-10-CM

## 2023-06-28 DIAGNOSIS — I2699 Other pulmonary embolism without acute cor pulmonale: Secondary | ICD-10-CM

## 2023-06-28 DIAGNOSIS — C6210 Malignant neoplasm of unspecified descended testis: Secondary | ICD-10-CM

## 2023-06-28 MED ORDER — BIKTARVY 50-200-25 MG PO TABS: 1.0000 | ORAL_TABLET | Freq: Every day | ORAL | Status: DC

## 2023-06-28 NOTE — Progress Notes (Signed)
Brief Narrative   Patient ID: Steve Stewart, male    DOB: 09-04-1990, 31 y.o.   MRN: [id number]  Steve Stewart is a 32 y/o caucasian male diagnosed with HIV disease in November 2024 with risk factor of MSM. Initial viral load of 59,200 with remaining lab work pending. No history of opportunistic infection. Rapid start with Biktarvy.   Subjective:    Chief Complaint  Patient presents with   New Patient (Initial Visit)    B20    HPI:  Steve Stewart is a 32 y.o. male with newly diagnosed HIV disease last seen on 06/25/2023 by Dr. Luciana Axe with positive HIV testing and awaiting confirmatory testing prior to starting medication.  Subsequently confirmatory testing returned positive with HIV-1 antibodies as well as a HIV-1 RNA level of 59,200.  CD4 count was unable to be completed secondary to an issue with the specimen.  Here today to establish care for HIV disease.  Steve Stewart has been doing okay since since he left the hospital and he is being treated for pulmonary embolism as well as preparing for chemotherapy for testicular cancer.  Has not started on any medications.  Does not recall his last HIV testing.  Risk factor for HIV is MSM.  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  Housing is stable and living with his mother.  Prior to that he was living in a camper with a solar panel.  Has good access to food and good support system around Steve Stewart.  No previous history of mental health disorders/concerns. No recreational or illicit drug use and is former tobacco smoker. Going through changes with his insurance and will meet with financial team today.   Denies fevers, chills, night sweats, headaches, changes in vision, neck pain/stiffness, nausea, diarrhea, vomiting, lesions or rashes.  Lab Results  Component Value Date   CD4TCELL  06/23/2023    CANCELED BY LAB, SPECIMEN DOES NOT MEET COLLECTION REQUIREMENTS   CD4TABS  06/23/2023     CANCELED BY LAB, SPECIMEN DOES NOT MEET COLLECTION REQUIREMENTS   Lab Results  Component Value Date   HIV1RNAQUANT 59,200 06/23/2023     No Known Allergies    Outpatient Medications Prior to Visit  Medication Sig Dispense Refill   acetaminophen (TYLENOL) 650 MG CR tablet Take 1,300 mg by mouth every 8 (eight) hours as needed for pain.     albuterol (VENTOLIN HFA) 108 (90 Base) MCG/ACT inhaler Inhale 1 puff into the lungs every 6 (six) hours as needed for wheezing or shortness of breath.     APIXABAN (ELIQUIS) VTE STARTER PACK (10MG  AND 5MG ) Take as directed on package: start with two-5mg  tablets twice daily for 7 days. On day 8, switch to one-5mg  tablet twice daily. 74 each 0   dexamethasone (DECADRON) 4 MG tablet Take 2 tablets (8 mg total) by mouth daily. Start the day after last cisplatin dose on day 6 of chemo x 3 days.Take with food. (Patient not taking: Reported on 06/21/2023) 30 tablet 1   lidocaine-prilocaine (EMLA) cream Apply to affected area once (Patient not taking: Reported on 06/21/2023) 30 g 3   ondansetron (ZOFRAN) 8 MG tablet Take 1 tablet (8 mg total) by mouth every 8 (eight) hours as needed for nausea or vomiting. Start on the third day after last cisplatin dose on day 8 of chemo (Patient not taking: Reported on 06/21/2023) 30 tablet 1   prochlorperazine (COMPAZINE) 10 MG tablet Take  1 tablet (10 mg total) by mouth every 6 (six) hours as needed for nausea or vomiting. (Patient not taking: Reported on 06/21/2023) 30 tablet 1   No facility-administered medications prior to visit.     Past Medical History:  Diagnosis Date   HIV infection [facility])    Medical history non-contributory    Testicular cancer [facility])      Past Surgical History:  Procedure Laterality Date   IR IMAGING GUIDED PORT INSERTION  06/18/2023   NO PAST SURGERIES     ORCHIECTOMY Left 05/02/2023   Procedure: LEFT INGUINAL ORCHIECTOMY;  Surgeon: Rene Paci, MD;  Location: [facility];   Service: Urology;  Laterality: Left;      Review of Systems  Constitutional:  Negative for appetite change, chills, fatigue, fever and unexpected weight change.  Eyes:  Negative for visual disturbance.  Respiratory:  Negative for cough, chest tightness, shortness of breath and wheezing.   Cardiovascular:  Negative for chest pain and leg swelling.  Gastrointestinal:  Negative for abdominal pain, constipation, diarrhea, nausea and vomiting.  Genitourinary:  Negative for dysuria, flank pain, frequency, genital sores, hematuria and urgency.  Skin:  Negative for rash.  Allergic/Immunologic: Negative for immunocompromised state.  Neurological:  Negative for dizziness and headaches.      Objective:    BP (!) 143/81   Pulse 88   Temp 97.9 F (36.6 C) (Temporal)   Ht 5\' 11"  (1.803 m)   Wt 251 lb (113.9 kg)   SpO2 97%   BMI 35.01 kg/m  Nursing note and vital signs reviewed.  Physical Exam Constitutional:      General: He is not in acute distress.    Appearance: He is well-developed.  Eyes:     Conjunctiva/sclera: Conjunctivae normal.  Cardiovascular:     Rate and Rhythm: Normal rate and regular rhythm.     Heart sounds: Normal heart sounds. No murmur heard.    No friction rub. No gallop.  Pulmonary:     Effort: Pulmonary effort is normal. No respiratory distress.     Breath sounds: Normal breath sounds. No wheezing or rales.  Chest:     Chest wall: No tenderness.  Abdominal:     General: Bowel sounds are normal.     Palpations: Abdomen is soft.     Tenderness: There is no abdominal tenderness.  Musculoskeletal:     Cervical back: Neck supple.  Lymphadenopathy:     Cervical: No cervical adenopathy.  Skin:    General: Skin is warm and dry.     Findings: No rash.  Neurological:     Mental Status: He is alert and oriented to person, place, and time.  Psychiatric:        Behavior: Behavior normal.        Thought Content: Thought content normal.        Judgment: Judgment  normal.         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 Tolerating Eliquis with no spontaneous bleeding.         Genitourinary   Malignant neoplasm of descended testis [facility])    Plan to start chemotherapy and managed by Oncology. Discussed possibility of adding Bactrim for OI prophylaxis pending financial coverage and initiation of chemotherapy.       Relevant Medications   bictegravir-emtricitabine-tenofovir AF (BIKTARVY) 50-200-25 MG TABS tablet     Other   HIV disease (HCC) - Primary    Steve Stewart is a 32 year old Caucasian male diagnosed with HIV-1 disease.  Initial viral load 59,200. Treatment naive and asymptomatic. Discussed the basics of HIV including transmission, risks if left untreated, treatment options, lab work, financial assistance, and plan of care. Introduced to Engineer, technical sales, counseling, Artist, and Engineer, materials. Met with financial counselor and working on ICAP/Medicaid. Met with pharmacy team. Plan for rapid start with Biktarvy. Will await CD4 count, however with plan for chemotherapy for testicular cancer will likely benefit from OI prophylaxis with Bactrim. Await financial coverage. Sample of Biktarvy provided. Plan for follow up in 1  month or sooner if needed.        Relevant Medications   bictegravir-emtricitabine-tenofovir AF (BIKTARVY) 50-200-25 MG TABS tablet   Other Relevant Orders   RPR   HEPATITIS B SURFACE ANTIGEN   HEPATITIS B SURFACE ANTIBODY   HEPATITIS B CORE ANTIBODY, TOTAL   HEPATITIS C ANTIBODY   HEPATITIS A ANTIBODY, TOTAL   LIPID PANEL   HIV-1 RNA ULTRAQUANT REFLEX TO GENTYP+   HLA B*5701   QuantiFERON-TB Gold Plus   T-helper cell (CD4)- ([facility] clinic only)     I am having Steve Stewart start on [place]. I am also having Steve Stewart maintain his acetaminophen, albuterol, dexamethasone, ondansetron, prochlorperazine, lidocaine-prilocaine, and Apixaban Starter Pack (10mg  and 5mg ).   Meds ordered this encounter  Medications   bictegravir-emtricitabine-tenofovir AF (BIKTARVY) 50-200-25 MG TABS tablet    Sig: Take 1 tablet by mouth daily.     Follow-up: Return in about 1 month (around 07/28/2023). or sooner if needed.    Marcos Eke, MSN, FNP-C Nurse Practitioner [facility] for Infectious Disease [facility] Main number: [phone]

## 2023-06-28 NOTE — Patient Instructions (Signed)
Nice to see you.  We will check your lab work today.  Continue to take your medication daily as prescribed.  Refills have been sent to the pharmacy.  Plan for follow up in 1 months or sooner if needed with lab work 1-2 weeks prior to appointment.   Plan for follow up in 1 months or sooner if needed with lab work on the same day.  Have a great day and stay safe!  

## 2023-06-28 NOTE — Assessment & Plan Note (Signed)
Plan to start chemotherapy and managed by Oncology. Discussed possibility of adding Bactrim for OI prophylaxis pending financial coverage and initiation of chemotherapy.

## 2023-06-28 NOTE — Progress Notes (Signed)
HIV Intake   Steve Stewart  32 y.o., male    Diagnosis Date: 06/23/23   Into care date: 06/28/23   Initial VL:  59,200  Initial CD4: pending    Risk factors: MSM  Referring agency/circumstances of diagnosis: Diagnosed at [facility] during an admission for pain and shortness of breath.   Last (-) HIV test: unknown    Patient Bio:    Steve Stewart is originally from [place], he lives in [place] with his mother. He purchased a Electronics engineer and renovated it for his residence. He previously worked in [employer], but has had to take a step back due to cancer diagnosis.   Reports stable food and housing. He has been able to discuss his recent diagnosis with another friend who is living with HIV.    Sexual Health    Sex/gender of partners: male  Pap (cervical/anal) history: discuss at 32 yo   Current relationship status: not currently in a sexual relationship    Substance Use    Tobacco/alcohol/substances: not discussed   Interest in treatment:     Mental Health    Dx history and medications: not discussed    Current symptoms: not discussed   Interest in treatment/counseling:     Primary Care    Other medical conditions: Patient Active Problem List   Diagnosis Date Noted   Hyponatremia 06/25/2023   Malignant neoplasm of descended testis (HCC) 06/25/2023   Regional lymph node metastasis present (HCC) 06/25/2023   Acute pulmonary embolism (HCC) 06/22/2023   Left leg pain 06/21/2023   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     albuterol (VENTOLIN HFA) 108 (90 Base) MCG/ACT inhaler Inhale 1 puff into the lungs every 6 (six) hours as needed for wheezing or shortness of breath.     APIXABAN (ELIQUIS) VTE  STARTER PACK (10MG  AND 5MG ) Take as directed on package: start with two-5mg  tablets twice daily for 7 days. On day 8, switch to one-5mg  tablet twice daily. 74 each 0   dexamethasone (DECADRON) 4 MG tablet Take 2 tablets (8 mg total) by mouth daily. Start the day after last cisplatin dose on day 6 of chemo x 3 days.Take with food. (Patient not taking: Reported on 06/21/2023) 30 tablet 1   lidocaine-prilocaine (EMLA) cream Apply to affected area once (Patient not taking: Reported on 06/21/2023) 30 g 3   ondansetron (ZOFRAN) 8 MG tablet Take 1 tablet (8 mg total) by mouth every 8 (eight) hours as needed for nausea or vomiting. Start on the third day after last cisplatin dose on day 8 of chemo (Patient not taking: Reported on 06/21/2023) 30 tablet 1   prochlorperazine (COMPAZINE) 10 MG tablet Take 1 tablet (10 mg total) by mouth every 6 (six) hours as needed for nausea or vomiting. (Patient not taking: Reported on 06/21/2023) 30 tablet 1   No current facility-administered medications on file prior to visit.      Dental   Dental concerns: Steve Stewart would like to see a dentist. Denies any current pain/discomfort, but has a history of dental pain and losing teeth.      Medication   Barriers to adherence: met with pharmacy team to discuss importance of adherence    Medication tolerance: unable to  assess, has not started medication yet      Labs   HIV  HIV Screen 4th Generation wRfx  Date Value Ref Range Status  06/23/2023 Reactive (A) Non Reactive Final    Comment:    REPEATED TO VERIFY (NOTE) Reactive result does not distinguish HIV-1 p24 antigen, HIV-1  antibody, HIV-2 antibody, and HIV-1 group O antibody. Results  reactive by HIV Antigen/Antibody EIA must be confirmed. Sent for  confirmation. Performed at [facility], [address], [place], [place] [address]    HIV 1 RNA Quant  Date Value Ref Range Status  06/23/2023 59,200 copies/mL Corrected    Comment:     (NOTE) The reportable range for this assay is 20 to 10,000,000 copies HIV-1 RNA/mL.      Hepatitis B  No results found for: "HEPBSAB", "HEPBSAG", "HEPBCAB"  Hepatitis C  No results found for: "HEPCAB", "HCVRNAPCRQN"  Hepatitis A  No results found for: "HAV"  RPR and STI  No results found for: "LABRPR", "RPRTITER"      No data to display           Additional Comments & Interventions    Discussed effect of ART on VL and CD4, discussed U=U, natural progression of disease and risk for other STIs.   Steve Stewart seems to be coping well with his recent medical diagnoses. He has a fair understanding of viral load and CD4 count. He has no further questions or concerns at this time, encouraged him to reach out for any needs he may have.   Referrals made: Dental  Sandie Ano, RN

## 2023-06-28 NOTE — Assessment & Plan Note (Signed)
Improved with some left lower leg edema and tenderness. Tolerating Eliquis with no spontaneous bleeding.

## 2023-06-28 NOTE — Assessment & Plan Note (Signed)
Steve Stewart is a 32 year old Caucasian male diagnosed with HIV-1 disease.  Initial viral load 59,200. Treatment naive and asymptomatic. Discussed the basics of HIV including transmission, risks if left untreated, treatment options, lab work, financial assistance, and plan of care. Introduced to Engineer, technical sales, counseling, Artist, and Engineer, materials. Met with financial counselor and working on ICAP/Medicaid. Met with pharmacy team. Plan for rapid start with Biktarvy. Will await CD4 count, however with plan for chemotherapy for testicular cancer will likely benefit from OI prophylaxis with Bactrim. Await financial coverage. Sample of Biktarvy provided. Plan for follow up in 1 month or sooner if needed.

## 2023-06-29 ENCOUNTER — Ambulatory Visit: Payer: Self-pay

## 2023-06-29 LAB — T-HELPER CELL (CD4) - (RCID CLINIC ONLY)
CD4 % Helper T Cell: 14 % — ABNORMAL LOW (ref 33–65)
CD4 T Cell Abs: 255 /uL — ABNORMAL LOW (ref 400–1790)

## 2023-07-02 ENCOUNTER — Encounter: Payer: Self-pay | Admitting: Family

## 2023-07-02 ENCOUNTER — Ambulatory Visit: Payer: Self-pay

## 2023-07-02 ENCOUNTER — Other Ambulatory Visit: Payer: Self-pay

## 2023-07-04 ENCOUNTER — Other Ambulatory Visit: Payer: Self-pay | Admitting: Pharmacist

## 2023-07-04 DIAGNOSIS — B20 Human immunodeficiency virus [HIV] disease: Secondary | ICD-10-CM

## 2023-07-04 MED ORDER — BIKTARVY 50-200-25 MG PO TABS: 1.0000 | ORAL_TABLET | Freq: Every day | ORAL | Status: DC

## 2023-07-04 NOTE — Progress Notes (Unsigned)
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 Start on Monday 11/18 Take Compazine 10 mg twice daily on days of chemotherapy  Acute pulmonary embolism (HCC) Continue apixaban twice daily Precautions on bleeding, low platelets. Recommend CBC, type and screen on day 9 Hold apixaban on day 11 until new labs on the following week for pretreatment on day 16.  HIV disease (HCC) Will continue treatment per ID   Report he is down to 3 cigarettes a day and he will quit by the end of this week.  The patient understands the plans discussed today and is in agreement with them.  He knows to contact our office if he develops concerns prior to his next appointment.  [name], MD  [facility] - [facility] DEPT OF [facility] [address] [place] [address] Dept: [phone] Dept Fax: [phone]   Orders Placed This Encounter  Procedures   CBC (Cancer Center Only)    Standing Status:   Future    Standing Expiration Date:   07/16/2024   CBC (Cancer Center Only)    Standing Status:   Future    Standing Expiration Date:   07/23/2024   AFP tumor marker    Standing Status:   Future    Standing Expiration Date:   07/29/2024   Beta HCG, Quant (tumor marker)    Standing Status:   Future    Standing Expiration Date:   07/29/2024   Lactate dehydrogenase    Standing Status:   Future    Standing Expiration Date:   07/29/2024   CBC with Differential (Cancer Center Only)    Standing Status:   Future    Standing Expiration Date:   07/29/2024   CMP (Cancer Center only)    Standing Status:   Future    Standing Expiration Date:   07/29/2024    Magnesium    Standing Status:   Future    Standing Expiration Date:   07/29/2024   CBC (Cancer Center Only)    Standing Status:   Future    Standing Expiration Date:   08/06/2024   CMP (Cancer Center only)    Standing Status:   Future    Standing Expiration Date:   08/06/2024   CBC (Cancer Center Only)    Standing Status:   Future    Standing Expiration Date:   08/13/2024   CMP (Cancer Center only)    Standing Status:   Future    Standing Expiration Date:   08/13/2024   AFP tumor marker    Standing Status:   Future    Standing Expiration Date:   08/19/2024   Beta HCG, Quant (tumor marker)    Standing Status:   Future    Standing Expiration Date:   08/19/2024   Lactate dehydrogenase    Standing Status:   Future    Standing Expiration Date:   08/19/2024   CBC with Differential (Cancer Center Only)    Standing Status:   Future    Standing Expiration Date:   08/19/2024   CMP (Cancer Center only)    Standing Status:   Future    Standing  Expiration Date:   08/19/2024   Magnesium    Standing Status:   Future    Standing Expiration Date:   08/19/2024   CBC (Cancer Center Only)    Standing Status:   Future    Standing Expiration Date:   08/27/2024   CMP (Cancer Center only)    Standing Status:   Future    Standing Expiration Date:   08/27/2024   CBC (Cancer Center Only)    Standing Status:   Future    Standing Expiration Date:   09/03/2024   CMP (Cancer Center only)    Standing Status:   Future    Standing Expiration Date:   09/03/2024      CHIEF COMPLAINT:  CC: Testicular cancer   INTERVAL HISTORY:  Johanna is here today for repeat clinical assessment. He denies any more chest pain, shortness of breath, leg swelling or pain.  Pain in the leg is resolved.  He is taking apixaban twice daily as prescribed.  No bleeding, bloody stool.  No other changes.  He has follow-up with ID and started treatment for HIV.  I have reviewed the past medical history, past surgical history, social  history and family history with the patient and they are unchanged from previous note.  ALLERGIES:  has No Known Allergies.  MEDICATIONS:  Current Outpatient Medications  Medication Sig Dispense Refill   acetaminophen (TYLENOL) 650 MG CR tablet Take 1,300 mg by mouth every 8 (eight) hours as needed for pain.     albuterol (VENTOLIN HFA) 108 (90 Base) MCG/ACT inhaler Inhale 1 puff into the lungs every 6 (six) hours as needed for wheezing or shortness of breath.     APIXABAN (ELIQUIS) VTE STARTER PACK (10MG  AND 5MG ) Take as directed on package: start with two-5mg  tablets twice daily for 7 days. On day 8, switch to one-5mg  tablet twice daily. 74 each 0   bictegravir-emtricitabine-tenofovir AF (BIKTARVY) 50-200-25 MG TABS tablet Take 1 tablet by mouth daily.     bictegravir-emtricitabine-tenofovir AF (BIKTARVY) 50-200-25 MG TABS tablet Take 1 tablet by mouth daily for 14 days.     dexamethasone (DECADRON) 4 MG tablet Take 2 tablets (8 mg total) by mouth daily. Start the day after last cisplatin dose on day 6 of chemo x 3 days.Take with food. (Patient not taking: Reported on 06/21/2023) 30 tablet 1   lidocaine-prilocaine (EMLA) cream Apply to affected area once (Patient not taking: Reported on 06/21/2023) 30 g 3   ondansetron (ZOFRAN) 8 MG tablet Take 1 tablet (8 mg total) by mouth every 8 (eight) hours as needed for nausea or vomiting. Start on the third day after last cisplatin dose on day 8 of chemo (Patient not taking: Reported on 06/21/2023) 30 tablet 1   prochlorperazine (COMPAZINE) 10 MG tablet Take 1 tablet (10 mg total) by mouth every 6 (six) hours as needed for nausea or vomiting. (Patient not taking: Reported on 06/21/2023) 30 tablet 1   No current facility-administered medications for this visit.    HISTORY OF PRESENT ILLNESS:   Oncology History  Primary seminoma of left testis (HCC)  04/28/2023 Initial Diagnosis   Primary seminoma of left testis [facility]) Presented to ED for worsening  testicular pain and presented with 2 months history of testicular mass.   04/28/2023 Imaging   [place] 1. Positive for abnormally enlarged and heterogeneous but vascular Left Testis highly suspicious for Testicular Neoplasm. Recommend Urology consultation. 2. Negative for testicular torsion.  Right testis appears normal.   04/28/2023 Imaging  CT CAP 1. Partially visualized thickening of the skin of the left scrotum. 2. Prominent bilateral axillary lymph nodes, right greater than left. Suspect at least 2 lymph nodes with abnormal cortical thickening in the right axilla. 3. Shotty retroperitoneal, bilateral iliac chain lymph nodes measuring up to 1.1 cm in short axis. 4. Bilateral inguinal lymph nodes with borderline thickened left inguinal lymph nodes measuring up to 1.1 cm in short axis. 5. 1.2 cm right adrenal mass, indeterminate. Attention on follow-up is recommended. Alternatively evaluation with MRI may be considered. 6. Hazy attenuation within the anterior mediastinum without defined mass may represent residual thymus. Attention on follow-up is recommended. 7. Cholelithiasis. 8. L4-L5 bilateral pars articularis defect. 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 10. Further evaluation with axillary and inguinal ultrasound may be considered.   05/02/2023 Surgery   TESTICLE, LEFT, ORCHIECTOMY:  Seminoma, size 6.1 cm  Tumor limited to testis with rete testis and lymphovascular invasion  (pT2)  Germ cell neoplasia in situ is present  Spermatic cord and all margins of resection are negative for tumor     06/25/2023 Imaging   CT AP IMPRESSION: 1. Stable mild periportal, bilateral inguinal, bilateral external iliac  and bilateral common iliac lymphadenopathy. 2. No new or progressive metastatic disease in the abdomen or pelvis. 3. Solid peripheral basilar 2.0 cm right lower lobe pulmonary nodule, new from 04/28/2023 CT, similar to recent 06/22/2023 chest CT,  favoring evolving pulmonary infarct related to recent diagnosis of acute pulmonary embolism. Additional patchy consolidation at the posterior right lung base seen on prior chest CT has resolved. 4. Cholelithiasis. 5. Stable bilateral adrenal nodules characterized as benign adenomas on recent MRI, requiring no further imaging follow-up.   07/09/2023 -  Chemotherapy   Patient is on Treatment Plan : TESTICULAR BEP q21d x 3 Cycles         REVIEW OF SYSTEMS:   All relevant systems were reviewed with the patient and are negative.   VITALS:  Blood pressure 111/78, pulse 81, temperature 97.9 F (36.6 C), temperature source Temporal, resp. rate 17, weight 255 lb (115.7 kg), SpO2 100%.  Wt Readings from Last 3 Encounters:  07/05/23 255 lb (115.7 kg)  06/28/23 251 lb (113.9 kg)  06/23/23 288 lb 2.3 oz (130.7 kg)    Body mass index is 35.57 kg/m.  Performance status (ECOG): 0 - Asymptomatic  PHYSICAL EXAM:   GENERAL:alert, no distress and comfortable SKIN: skin color normal, no rashes  EYES: normal, sclera clear LUNGS: clear to auscultation with normal breathing effort.  No wheeze or rales HEART: regular rate & rhythm and no murmurs and no lower extremity edema   LABORATORY DATA:  I have reviewed the data as listed    Component Value Date/Time   NA 133 (L) 06/23/2023 0500   K 4.0 06/23/2023 0500   CL 103 06/23/2023 0500   CO2 23 06/23/2023 0500   GLUCOSE 100 (H) 06/23/2023 0500   BUN 12 06/23/2023 0500   CREATININE 0.78 06/23/2023 0500   CREATININE 1.06 06/20/2023 0846   CALCIUM 8.5 (L) 06/23/2023 0500   PROT 7.8 06/22/2023 2030   ALBUMIN 3.9 06/22/2023 2030   AST 25 06/22/2023 2030   AST 17 06/20/2023 0846   ALT 27 06/22/2023 2030   ALT 26 06/20/2023 0846   ALKPHOS 62 06/22/2023 2030   BILITOT 1.3 (H) 06/22/2023 2030   BILITOT 0.6 06/20/2023 0846   GFRNONAA >60 06/23/2023 0500   GFRNONAA >60 06/20/2023 1191  GFRAA >90 06/16/2012 0048    No results found for:  "SPEP", "UPEP"  Lab Results  Component Value Date   WBC 5.1 06/25/2023   NEUTROABS 4.5 06/22/2023   HGB 13.1 06/25/2023   HCT 40.2 06/25/2023   MCV 86.8 06/25/2023   PLT 125 (L) 06/25/2023      Chemistry      Component Value Date/Time   NA 133 (L) 06/23/2023 0500   K 4.0 06/23/2023 0500   CL 103 06/23/2023 0500   CO2 23 06/23/2023 0500   BUN 12 06/23/2023 0500   CREATININE 0.78 06/23/2023 0500   CREATININE 1.06 06/20/2023 0846      Component Value Date/Time   CALCIUM 8.5 (L) 06/23/2023 0500   ALKPHOS 62 06/22/2023 2030   AST 25 06/22/2023 2030   AST 17 06/20/2023 0846   ALT 27 06/22/2023 2030   ALT 26 06/20/2023 0846   BILITOT 1.3 (H) 06/22/2023 2030   BILITOT 0.6 06/20/2023 0846       RADIOGRAPHIC STUDIES: I have personally reviewed the radiological images as listed and agreed with the findings in the report. CT ABDOMEN PELVIS W CONTRAST  Result Date: 06/25/2023 CLINICAL DATA:  Inpatient. Testicular seminoma. HIV. Restaging. * Tracking Code: BO * EXAM: CT ABDOMEN AND PELVIS WITH CONTRAST TECHNIQUE: Multidetector CT imaging of the abdomen and pelvis was performed using the standard protocol following bolus administration of intravenous contrast. RADIATION DOSE REDUCTION: This exam was performed according to the departmental dose-optimization program which includes automated exposure control, adjustment of the mA and/or kV according to patient size and/or use of iterative reconstruction technique. CONTRAST:  OMNIPAQUE IOHEXOL 300 MG/ML  SOLN COMPARISON:  06/22/2023 MRI abdomen. 04/28/2023 CT chest, abdomen and pelvis. FINDINGS: Lower chest: Solid peripheral basilar 2.0 cm right lower lobe pulmonary nodule (series 4/image 26), new from 04/28/2023 CT, similar to 06/22/2023 chest CT, favoring evolving pulmonary infarct related 2 recent diagnosis of acute pulmonary embolism. Additional patchy consolidation at the posterior right lung base seen on prior chest CT has resolved.  Superior approach central venous catheter tip seen at the cavoatrial junction. Hepatobiliary: Normal liver size. No liver mass. Cholelithiasis. No biliary ductal dilatation. Pancreas: Normal, with no mass or duct dilation. Spleen: Normal size. No mass. Adrenals/Urinary Tract: Stable 1.7 cm right adrenal and 1.6 cm left adrenal nodules characterized as benign adenomas on recent MRI. Normal kidneys with no hydronephrosis and no renal mass. Normal bladder. Stomach/Bowel: Normal non-distended stomach. Normal caliber small bowel with no small bowel wall thickening. Normal appendix. Normal large bowel with no diverticulosis, large bowel wall thickening or pericolonic fat stranding. Vascular/Lymphatic: Normal caliber abdominal aorta. Patent portal, splenic, hepatic and renal veins. Stable mildly enlarged 1.2 cm periportal node (series 2/image 28). Borderline mildly enlarged bilateral inguinal lymph nodes up to 1.4 cm on the left (series 2/image 96), not substantially changed. Stable bilateral external iliac lymphadenopathy up to 1.3 cm on the right (series 2/image 88). Stable mild bilateral common iliac adenopathy up to 1.2 cm on the left (series 2/image 66). Reproductive: Normal size prostate. Other: No pneumoperitoneum, ascites or focal fluid collection. Musculoskeletal: No aggressive appearing focal osseous lesions. Minimal lower thoracic spondylosis. IMPRESSION: 1. Stable mild periportal, bilateral inguinal, bilateral external iliac and bilateral common iliac lymphadenopathy. 2. No new or progressive metastatic disease in the abdomen or pelvis. 3. Solid peripheral basilar 2.0 cm right lower lobe pulmonary nodule, new from 04/28/2023 CT, similar to recent 06/22/2023 chest CT, favoring evolving pulmonary infarct related to recent diagnosis of  acute pulmonary embolism. Additional patchy consolidation at the posterior right lung base seen on prior chest CT has resolved. 4. Cholelithiasis. 5. Stable bilateral adrenal  nodules characterized as benign adenomas on recent MRI, requiring no further imaging follow-up. Electronically Signed   By: Delbert Phenix M.D.   On: 06/25/2023 19:53   ECHOCARDIOGRAM COMPLETE  Result Date: 06/23/2023    ECHOCARDIOGRAM REPORT   Patient Name:   LAFAYETTE MEECH Date of Exam: 06/23/2023 Medical Rec #:  [id number]    Height:       71.0 in Accession #:    [id number]   Weight:       288.1 lb Date of Birth:  [birth year]    BSA:          2.463 m Patient Age:    32 years     BP:           115/85 mmHg Patient Gender: M            HR:           90 bpm. Exam Location:  Inpatient Procedure: 2D Echo, Cardiac Doppler and Color Doppler Indications:    I26.02 Pulmonary embolus  History:        Patient has no prior history of Echocardiogram examinations.                 Signs/Symptoms:Shortness of Breath and Dyspnea. Cancer.  Sonographer:    Sheralyn Boatman RDCS Referring Phys: [id number] CAROLE N HALL  Sonographer Comments: Technically difficult study due to poor echo windows. Patient supine due to pain. IMPRESSIONS  1. Left ventricular ejection fraction, by estimation, is 60 to 65%. The left ventricle has normal function. The left ventricle has no regional wall motion abnormalities. There is mild concentric left ventricular hypertrophy. Left ventricular diastolic parameters were normal.  2. Right ventricular systolic function is normal. The right ventricular size is normal.  3. The mitral valve is normal in structure. Trivial mitral valve regurgitation. No evidence of mitral stenosis.  4. The aortic valve is tricuspid. Aortic valve regurgitation is not visualized. No aortic stenosis is present.  5. The inferior vena cava is normal in size with greater than 50% respiratory variability, suggesting right atrial pressure of 3 mmHg. Comparison(s): No prior Echocardiogram. FINDINGS  Left Ventricle: Left ventricular ejection fraction, by estimation, is 60 to 65%. The left ventricle has normal function. The left ventricle has no  regional wall motion abnormalities. The left ventricular internal cavity size was normal in size. There is  mild concentric left ventricular hypertrophy. Left ventricular diastolic parameters were normal. Right Ventricle: The right ventricular size is normal. Right ventricular systolic function is normal. Left Atrium: Left atrial size was normal in size. Right Atrium: Right atrial size was normal in size. Pericardium: There is no evidence of pericardial effusion. Mitral Valve: The mitral valve is normal in structure. Trivial mitral valve regurgitation. No evidence of mitral valve stenosis. Tricuspid Valve: The tricuspid valve is normal in structure. Tricuspid valve regurgitation is not demonstrated. No evidence of tricuspid stenosis. Aortic Valve: The aortic valve is tricuspid. Aortic valve regurgitation is not visualized. No aortic stenosis is present. Pulmonic Valve: The pulmonic valve was grossly normal. Pulmonic valve regurgitation is not visualized. No evidence of pulmonic stenosis. Aorta: The aortic root is normal in size and structure. Venous: The inferior vena cava is normal in size with greater than 50% respiratory variability, suggesting right atrial pressure of 3 mmHg. IAS/Shunts: The interatrial septum was not well visualized.  LEFT VENTRICLE PLAX 2D LVIDd:         4.50 cm     Diastology LVIDs:         3.20 cm     LV e' medial:    9.14 cm/s LV PW:         1.30 cm     LV E/e' medial:  8.6 LV IVS:        1.10 cm     LV e' lateral:   11.30 cm/s LVOT diam:     2.70 cm     LV E/e' lateral: 7.0 LV SV:         111 LV SV Index:   45 LVOT Area:     5.73 cm  LV Volumes (MOD) LV vol d, MOD A2C: 48.0 ml LV vol d, MOD A4C: 59.4 ml LV vol s, MOD A2C: 16.6 ml LV vol s, MOD A4C: 25.4 ml LV SV MOD A2C:     31.4 ml LV SV MOD A4C:     59.4 ml LV SV MOD BP:      32.8 ml RIGHT VENTRICLE             IVC RV S prime:     13.10 cm/s  IVC diam: 1.90 cm TAPSE (M-mode): 1.7 cm LEFT ATRIUM             Index        RIGHT ATRIUM            Index LA diam:        3.60 cm 1.46 cm/m   RA Area:     12.30 cm LA Vol (A2C):   23.5 ml 9.54 ml/m   RA Volume:   26.20 ml  10.64 ml/m LA Vol (A4C):   34.9 ml 14.17 ml/m LA Biplane Vol: 29.0 ml 11.78 ml/m  AORTIC VALVE LVOT Vmax:   112.00 cm/s LVOT Vmean:  65.600 cm/s LVOT VTI:    0.194 m  AORTA Ao Root diam: 3.40 cm Ao Asc diam:  3.30 cm MITRAL VALVE MV Area (PHT): 3.66 cm    SHUNTS MV Decel Time: 208 msec    Systemic VTI:  0.19 m MV E velocity: 79.00 cm/s  Systemic Diam: 2.70 cm MV A velocity: 62.70 cm/s MV E/A ratio:  1.26 Olga Millers MD Electronically signed by Olga Millers MD Signature Date/Time: 06/23/2023/3:34:29 PM    Final    CT Angio Chest PE W and/or Wo Contrast  Result Date: 06/22/2023 CLINICAL DATA:  Recent diagnosis left testicular cancer with orchiectomy 05/02/2023, imaging guided right chest port insertion 06/18/2023, presents with dyspnea and chest wall pain with suspected DVT. EXAM: CT ANGIOGRAPHY CHEST WITH CONTRAST TECHNIQUE: Multidetector CT imaging of the chest was performed using the standard protocol during bolus administration of intravenous contrast. Multiplanar CT image reconstructions and MIPs were obtained to evaluate the vascular anatomy. RADIATION DOSE REDUCTION: This exam was performed according to the departmental dose-optimization program which includes automated exposure control, adjustment of the mA and/or kV according to patient size and/or use of iterative reconstruction technique. CONTRAST:  75mL OMNIPAQUE IOHEXOL 350 MG/ML SOLN COMPARISON:  Chest, abdomen pelvis CT with IV contrast 04/28/2023, MRI abdomen earlier today. FINDINGS: Cardiovascular: Interval new insertion of a right chest port with IJ approach catheter terminating at about the superior cavoatrial junction. The cardiac size is upper limits normal. There is no pericardial effusion. No visible coronary calcifications. The aorta, great vessels, and pulmonary veins are normal. The pulmonary arteries  are within normal caliber limits. There is hypodense acute thrombus, which grossly appears nonoccluding, in the right lower lobe main artery with extension into at least 2 posterior basal segmental arteries and with scattered thrombus in some of the downstream subsegmental posterior basilar arteries, some of which does appear occluding within the smaller arteries. In the right upper lobe, additional nonocclusive thrombus is seen in the anterior segmental artery with extension into 1 downstream subsegmental branch. No other arterial embolic disease is seen. Overall this is a small clot burden without findings of right heart strain. Mediastinum/Nodes: Mildly enlarged bilateral hilar lymph nodes are again noted, largest on the right is 1.5 cm short axis on 4:57, unchanged. Largest on the left 1.3 cm short axis on 4:67, stable. There are shotty prevascular space nodes which were also noted previously. Largest of these is 8 mm in short axis on 4:53, with a subcarinal nodal complex measuring 1.5 cm short axis, slightly larger than previously. There is no other mediastinal adenopathy. No thyroid mass. Bilateral axillary adenopathy is redemonstrated. Again the largest right axillary node is 2.3 x 2.7 cm on 4:45, and the largest on the left 3.2 x 1.8 cm on 4:55, all unchanged. The thoracic trachea, main bronchi and thoracic esophagus are unremarkable. Lungs/Pleura: There is patchy consolidation in the posterior basal right lower lobe which is probably related to 1 or more evolving infarctions, interspersed with ground-glass disease. Underlying pneumonia is possible in the proper clinical setting. There is increased atelectasis in the posterior left lower lobe superior segment and in the medial segment of the right middle lobe. The lungs are otherwise clear. There is no pleural effusion, thickening or pneumothorax. Upper Abdomen: Fatty liver with mild hepatosplenomegaly, splenic coronal diameter 16 cm, previously also 16 cm,  with again noted 2.1 cm stone in the distal gallbladder. No acute upper abdominal abnormality. Stable bilateral adrenal adenomas. Mild elevation right hemidiaphragm. Musculoskeletal: No chest wall abnormality. No acute or significant osseous findings. Review of the MIP images confirms the above findings. IMPRESSION: 1. Acute pulmonary emboli in the right lower lobe main artery with extension into at least 2 posterior basal segmental arteries, scattered thrombus in some of the downstream subsegmental posterior basilar arteries, some of which does appear occluding within the smaller arteries. 2. Additional nonocclusive thrombus in the right upper lobe anterior segmental artery with extension into 1 downstream subsegmental branch. 3. Overall this is a small clot burden without findings of acute right heart strain. 4. Patchy consolidation in the posterior basal right lower lobe which is probably related to 1 or more evolving infarctions, interspersed with ground-glass disease. Underlying pneumonia is possible in the proper clinical setting. 5. Stable bilateral axillary adenopathy. Stable mild bihilar adenopathy. 6. Stable fatty liver with mild hepatosplenomegaly. Cholelithiasis. Stable bilateral adrenal adenomas. 7. Critical Value/emergent results were called by telephone at the time of interpretation on 06/22/2023 at 10:06 pm to provider ABIGAIL HARRIS , who verbally acknowledged these results. Electronically Signed   By: Almira Bar M.D.   On: 06/22/2023 22:18   MR Abdomen W Wo Contrast  Result Date: 06/22/2023 CLINICAL DATA:  Characterize right adrenal mass, new diagnosis testicular cancer EXAM: MRI ABDOMEN WITHOUT AND WITH CONTRAST TECHNIQUE: Multiplanar multisequence MR imaging of the abdomen was performed both before and after the administration of intravenous contrast. CONTRAST:  10mL GADAVIST GADOBUTROL 1 MMOL/ML IV SOLN COMPARISON:  CT chest abdomen pelvis, 04/28/2023 FINDINGS: Lower chest: No acute  abnormality. Hepatobiliary: No solid liver abnormality is seen. Hepatomegaly, maximum coronal  span 20.0 cm. Gallstone. No gallbladder wall thickening, or biliary dilatation. Pancreas: Unremarkable. No pancreatic ductal dilatation or surrounding inflammatory changes. Spleen: Splenomegaly, maximum span 16.0 cm. Adrenals/Urinary Tract: Definitively benign, macroscopic fat containing bilateral adrenal adenomata (series 6, image 38). Kidneys are normal, without renal calculi, solid lesion, or hydronephrosis. Stomach/Bowel: Stomach is within normal limits. No evidence of bowel wall thickening, distention, or inflammatory changes. Vascular/Lymphatic: No significant vascular findings are present. No enlarged abdominal lymph nodes. Other: No abdominal wall hernia or abnormality. No ascites. Musculoskeletal: No acute or significant osseous findings. IMPRESSION: 1. Definitively benign, macroscopic fat containing bilateral adrenal adenomata, for which no specific further follow-up or characterization is required. 2. No evidence of lymphadenopathy or metastatic disease in the abdomen. 3. Cholelithiasis. 4. Hepatosplenomegaly. Electronically Signed   By: Jearld Lesch M.D.   On: 06/22/2023 17:21   VAS [place] LOWER EXTREMITY VENOUS (DVT)  Result Date: 06/21/2023  Lower Venous DVT Study Patient Name:  REDMOND CLARE  Date of Exam:   06/21/2023 Medical Rec #: [id number]     Accession #:    [id number] Date of Birth: 11/05/1990     Patient Gender: M Patient Age:   46 years Exam Location:  [facility] Procedure:      VAS [place] LOWER EXTREMITY VENOUS (DVT) Referring Phys: Gelsey Amyx --------------------------------------------------------------------------------  Indications: Pain.  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 Limitations: Patient tension. Comparison Study: No prior study Performing Technologist: Shona Simpson  Examination Guidelines: A complete evaluation includes B-mode imaging, spectral Doppler, color Doppler, and power  Doppler as needed of all accessible portions of each vessel. Bilateral testing is considered an integral part of a complete examination. Limited examinations for reoccurring indications may be performed as noted. The reflux portion of the exam is performed with the patient in reverse Trendelenburg.  +-----+---------------+---------+-----------+----------+---------------+ RIGHTCompressibilityPhasicitySpontaneityPropertiesThrombus Aging  +-----+---------------+---------+-----------+----------+---------------+ CFV                 Yes      Yes                  Patent by color +-----+---------------+---------+-----------+----------+---------------+   +---------+---------------+---------+-----------+----------+---------------+ LEFT     CompressibilityPhasicitySpontaneityPropertiesThrombus Aging  +---------+---------------+---------+-----------+----------+---------------+ CFV      Full           Yes      Yes                                  +---------+---------------+---------+-----------+----------+---------------+ SFJ      Full                                                         +---------+---------------+---------+-----------+----------+---------------+ FV Prox  Full                                                         +---------+---------------+---------+-----------+----------+---------------+ FV Mid   Full                                                         +---------+---------------+---------+-----------+----------+---------------+  FV DistalFull                                                         +---------+---------------+---------+-----------+----------+---------------+ PFV                                                   Patent by color +---------+---------------+---------+-----------+----------+---------------+ POP      None           No       No                                    +---------+---------------+---------+-----------+----------+---------------+ PTV      None                                                         +---------+---------------+---------+-----------+----------+---------------+ PERO     None                                                         +---------+---------------+---------+-----------+----------+---------------+ Soleal   Full                                                         +---------+---------------+---------+-----------+----------+---------------+ Gastroc  Full                                                         +---------+---------------+---------+-----------+----------+---------------+     Summary: RIGHT: - No evidence of common femoral vein obstruction.   LEFT: - Findings consistent with acute deep vein thrombosis involving the left popliteal vein, left posterior tibial veins, and left peroneal veins.  - No cystic structure found in the popliteal fossa.  *See table(s) above for measurements and observations. Electronically signed by Heath Lark on 06/21/2023 at 4:18:20 PM.    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 EXAM: IMPLANTED PORT A CATH PLACEMENT WITH ULTRASOUND AND FLUOROSCOPIC GUIDANCE ANESTHESIA/SEDATION: Moderate (conscious) sedation was employed during this procedure. A total of Versed 4.0 mg and Fentanyl 100 mcg was administered intravenously. Moderate Sedation Time: 35 minutes. The patient's level of consciousness and vital signs were monitored continuously by radiology nursing throughout the procedure under my direct supervision. FLUOROSCOPY: 43 seconds.  6.0 mGy. PROCEDURE: The procedure, risks, benefits, and alternatives were explained to the patient. Questions  regarding the procedure were encouraged and answered. The patient understands and consents to the  procedure. A time-out was performed prior to initiating the procedure. Ultrasound was utilized to confirm patency of the right internal jugular vein. An ultrasound image was saved and recorded. The right neck and chest were prepped with chlorhexidine in a sterile fashion, and a sterile drape was applied covering the operative field. Maximum barrier sterile technique with sterile gowns and gloves were used for the procedure. Local anesthesia was provided with 1% lidocaine. After creating a small venotomy incision, a 21 gauge needle was advanced into the right internal jugular vein under direct, real-time ultrasound guidance. Ultrasound image documentation was performed. After securing guidewire access, an 8 Fr dilator was placed. A J-wire was kinked to measure appropriate catheter length. A subcutaneous port pocket was then created along the upper chest wall utilizing sharp and blunt dissection. Portable cautery was utilized. The pocket was irrigated with sterile saline. A single lumen power injectable port was chosen for placement. The 8 Fr catheter was tunneled from the port pocket site to the venotomy incision. The port was placed in the pocket. External catheter was trimmed to appropriate length based on guidewire measurement. At the venotomy, an 8 Fr peel-away sheath was placed over a guidewire. The catheter was then placed through the sheath and the sheath removed. Final catheter positioning was confirmed and documented with a fluoroscopic spot image. The port was accessed with a needle and aspirated and flushed with heparinized saline. The access needle was removed. The venotomy and port pocket incisions were closed with subcutaneous 3-0 Monocryl and subcuticular 4-0 Vicryl. Dermabond was applied to both incisions. COMPLICATIONS: COMPLICATIONS None FINDINGS: After catheter placement, the tip lies at the cavo-atrial junction. The catheter aspirates normally and is ready for immediate use. IMPRESSION: Placement  of single lumen port a cath via right internal jugular vein. The catheter tip lies at the cavo-atrial junction. A power injectable port a cath was placed and is ready for immediate use. Electronically Signed   By: Irish Lack M.D.   On: 06/18/2023 16:19

## 2023-07-04 NOTE — Progress Notes (Signed)
Medication Samples have been provided to the patient.  Drug name: Biktarvy        Strength: 50/200/25 mg       Qty: 2 bottles (14 tablets)   LOT: [id number]   Exp.Date: 05/2025  Dosing instructions: Take one tablet by mouth once daily  The patient has been instructed regarding the correct time, dose, and frequency of taking this medication, including desired effects and most common side effects.   Louis Ivery L. Jannette Fogo, PharmD, BCIDP, AAHIVP, CPP Clinical Pharmacist Practitioner Infectious Diseases Clinical Pharmacist [facility] 08/02/2020, 10:07 AM

## 2023-07-05 ENCOUNTER — Inpatient Hospital Stay: Payer: 59

## 2023-07-05 VITALS — BP 111/78 | HR 81 | Temp 97.9°F | Resp 17 | Wt 255.0 lb

## 2023-07-05 DIAGNOSIS — K121 Other forms of stomatitis: Secondary | ICD-10-CM | POA: Diagnosis not present

## 2023-07-05 DIAGNOSIS — Z7901 Long term (current) use of anticoagulants: Secondary | ICD-10-CM | POA: Insufficient documentation

## 2023-07-05 DIAGNOSIS — I2699 Other pulmonary embolism without acute cor pulmonale: Secondary | ICD-10-CM | POA: Insufficient documentation

## 2023-07-05 DIAGNOSIS — B20 Human immunodeficiency virus [HIV] disease: Secondary | ICD-10-CM | POA: Insufficient documentation

## 2023-07-05 DIAGNOSIS — Z5111 Encounter for antineoplastic chemotherapy: Secondary | ICD-10-CM | POA: Insufficient documentation

## 2023-07-05 DIAGNOSIS — D696 Thrombocytopenia, unspecified: Secondary | ICD-10-CM | POA: Diagnosis not present

## 2023-07-05 DIAGNOSIS — F17210 Nicotine dependence, cigarettes, uncomplicated: Secondary | ICD-10-CM | POA: Diagnosis not present

## 2023-07-05 DIAGNOSIS — C6292 Malignant neoplasm of left testis, unspecified whether descended or undescended: Secondary | ICD-10-CM | POA: Diagnosis not present

## 2023-07-05 NOTE — Patient Instructions (Signed)
Start treatment on Monday 11/18. Plan for 3 cycles. Adrenal gland is benign from MRI. Good news. Hold apixaban on day 11 (Thursday of 2nd week) until new labs on the following week for pretreatment on day 16.  Go to ED if new fever, signs of infection, or bleeding.

## 2023-07-05 NOTE — Assessment & Plan Note (Signed)
Will continue treatment per ID

## 2023-07-05 NOTE — Assessment & Plan Note (Addendum)
pT2 [id number] normal TM. 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 Start on Monday 11/18 Take Compazine 10 mg twice daily on days of chemotherapy

## 2023-07-05 NOTE — Assessment & Plan Note (Addendum)
Continue apixaban twice daily Precautions on bleeding, low platelets. Recommend CBC, type and screen on day 9 Hold apixaban on day 11 until new labs on the following week for pretreatment on day 16.

## 2023-07-06 MED FILL — Fosaprepitant Dimeglumine For IV Infusion 150 MG (Base Eq): INTRAVENOUS | Qty: 5 | Status: AC

## 2023-07-07 LAB — HEPATITIS B CORE ANTIBODY, TOTAL: Hep B Core Total Ab: NONREACTIVE

## 2023-07-07 LAB — HEPATITIS B SURFACE ANTIGEN: Hepatitis B Surface Ag: NONREACTIVE

## 2023-07-07 LAB — HIV-1 GENOTYPE: HIV-1 Genotype: DETECTED — AB

## 2023-07-07 LAB — HEPATITIS C ANTIBODY: Hepatitis C Ab: NONREACTIVE

## 2023-07-07 LAB — HEPATITIS A ANTIBODY, TOTAL: Hepatitis A AB,Total: NONREACTIVE

## 2023-07-07 LAB — QUANTIFERON-TB GOLD PLUS
Mitogen-NIL: 6.62 [IU]/mL
NIL: 0.03 [IU]/mL
QuantiFERON-TB Gold Plus: NEGATIVE
TB1-NIL: 0.02 [IU]/mL
TB2-NIL: 0.03 [IU]/mL

## 2023-07-07 LAB — RPR: RPR Ser Ql: NONREACTIVE

## 2023-07-07 LAB — LIPID PANEL
Cholesterol: 140 mg/dL (ref <200)
HDL: 34 mg/dL — ABNORMAL LOW (ref >=40)
LDL Cholesterol (Calc): 82 mg/dL
Non-HDL Cholesterol (Calc): 106 mg/dL (ref <130)
Total CHOL/HDL Ratio: 4.1 (calc) (ref <5.0)
Triglycerides: 141 mg/dL (ref <150)

## 2023-07-07 LAB — HIV-1 RNA ULTRAQUANT REFLEX TO GENTYP+
HIV 1 RNA Quant: 117000 {copies}/mL — ABNORMAL HIGH
HIV-1 RNA Quant, Log: 5.07 {Log} — ABNORMAL HIGH

## 2023-07-07 LAB — HEPATITIS B SURFACE ANTIBODY,QUALITATIVE: Hep B S Ab: NONREACTIVE

## 2023-07-07 LAB — HLA B*5701: HLA-B*5701 w/rflx HLA-B High: NEGATIVE

## 2023-07-09 ENCOUNTER — Other Ambulatory Visit: Payer: Self-pay

## 2023-07-09 ENCOUNTER — Other Ambulatory Visit: Payer: Self-pay | Admitting: Pharmacist

## 2023-07-09 ENCOUNTER — Inpatient Hospital Stay: Payer: 59

## 2023-07-09 ENCOUNTER — Telehealth: Payer: Self-pay

## 2023-07-09 ENCOUNTER — Inpatient Hospital Stay: Payer: Medicaid Other

## 2023-07-09 ENCOUNTER — Ambulatory Visit: Payer: Self-pay

## 2023-07-09 ENCOUNTER — Other Ambulatory Visit (HOSPITAL_COMMUNITY): Payer: Self-pay

## 2023-07-09 VITALS — BP 111/67 | HR 92 | Temp 98.1°F | Resp 17 | Wt 255.8 lb

## 2023-07-09 DIAGNOSIS — C6292 Malignant neoplasm of left testis, unspecified whether descended or undescended: Secondary | ICD-10-CM

## 2023-07-09 DIAGNOSIS — Z5111 Encounter for antineoplastic chemotherapy: Secondary | ICD-10-CM | POA: Diagnosis not present

## 2023-07-09 DIAGNOSIS — B20 Human immunodeficiency virus [HIV] disease: Secondary | ICD-10-CM

## 2023-07-09 LAB — CBC WITH DIFFERENTIAL (CANCER CENTER ONLY)
Abs Immature Granulocytes: 0.01 10*3/uL (ref 0.00–0.07)
Basophils Absolute: 0 10*3/uL (ref 0.0–0.1)
Basophils Relative: 0 %
Eosinophils Absolute: 0.2 10*3/uL (ref 0.0–0.5)
Eosinophils Relative: 3 %
HCT: 42.1 % (ref 39.0–52.0)
Hemoglobin: 14.2 g/dL (ref 13.0–17.0)
Immature Granulocytes: 0 %
Lymphocytes Relative: 42 %
Lymphs Abs: 2.1 10*3/uL (ref 0.7–4.0)
MCH: 28.5 pg (ref 26.0–34.0)
MCHC: 33.7 g/dL (ref 30.0–36.0)
MCV: 84.4 fL (ref 80.0–100.0)
Monocytes Absolute: 0.3 10*3/uL (ref 0.1–1.0)
Monocytes Relative: 6 %
Neutro Abs: 2.4 10*3/uL (ref 1.7–7.7)
Neutrophils Relative %: 49 %
Platelet Count: 128 10*3/uL — ABNORMAL LOW (ref 150–400)
RBC: 4.99 MIL/uL (ref 4.22–5.81)
RDW: 13.6 % (ref 11.5–15.5)
WBC Count: 5 10*3/uL (ref 4.0–10.5)
nRBC: 0 % (ref 0.0–0.2)

## 2023-07-09 LAB — CMP (CANCER CENTER ONLY)
ALT: 27 U/L (ref 0–44)
AST: 16 U/L (ref 15–41)
Albumin: 4 g/dL (ref 3.5–5.0)
Alkaline Phosphatase: 55 U/L (ref 38–126)
Anion gap: 7 (ref 5–15)
BUN: 19 mg/dL (ref 6–20)
CO2: 26 mmol/L (ref 22–32)
Calcium: 9.2 mg/dL (ref 8.9–10.3)
Chloride: 104 mmol/L (ref 98–111)
Creatinine: 0.97 mg/dL (ref 0.61–1.24)
GFR, Estimated: >60 mL/min (ref >60)
Glucose, Bld: 124 mg/dL — ABNORMAL HIGH (ref 70–99)
Potassium: 3.9 mmol/L (ref 3.5–5.1)
Sodium: 137 mmol/L (ref 135–145)
Total Bilirubin: 0.5 mg/dL (ref <1.2)
Total Protein: 7.2 g/dL (ref 6.5–8.1)

## 2023-07-09 LAB — MAGNESIUM: Magnesium: 1.9 mg/dL (ref 1.7–2.4)

## 2023-07-09 LAB — LACTATE DEHYDROGENASE: LDH: 156 U/L (ref 98–192)

## 2023-07-09 MED ORDER — DEXAMETHASONE SODIUM PHOSPHATE 10 MG/ML IJ SOLN
10.0000 mg | Freq: Once | INTRAMUSCULAR | Status: AC
Start: 2023-07-09 — End: 2023-07-09
  Administered 2023-07-09: 10 mg via INTRAVENOUS
  Filled 2023-07-09: qty 1

## 2023-07-09 MED ORDER — SODIUM CHLORIDE 0.9 % IV SOLN: Freq: Once | INTRAVENOUS | Status: AC

## 2023-07-09 MED ORDER — SODIUM CHLORIDE 0.9% FLUSH
10.0000 mL | INTRAVENOUS | Status: DC | PRN
Start: 2023-07-09 — End: 2023-07-09
  Administered 2023-07-09: 10 mL

## 2023-07-09 MED ORDER — PALONOSETRON HCL INJECTION 0.25 MG/5ML
0.2500 mg | Freq: Once | INTRAVENOUS | Status: AC
  Administered 2023-07-09: 0.25 mg via INTRAVENOUS
  Filled 2023-07-09: qty 5

## 2023-07-09 MED ORDER — POTASSIUM CHLORIDE IN NACL 20-0.9 MEQ/L-% IV SOLN
Freq: Once | INTRAVENOUS | Status: AC
  Filled 2023-07-09: qty 1000

## 2023-07-09 MED ORDER — HEPARIN SOD (PORK) LOCK FLUSH 100 UNIT/ML IV SOLN
500.0000 [IU] | Freq: Once | INTRAVENOUS | Status: AC | PRN
  Administered 2023-07-09: 500 [IU]

## 2023-07-09 MED ORDER — MAGNESIUM SULFATE 2 GM/50ML IV SOLN
2.0000 g | Freq: Once | INTRAVENOUS | Status: AC
Start: 2023-07-09 — End: 2023-07-09
  Administered 2023-07-09: 2 g via INTRAVENOUS
  Filled 2023-07-09: qty 50

## 2023-07-09 MED ORDER — SODIUM CHLORIDE 0.9 % IV SOLN
150.0000 mg | Freq: Once | INTRAVENOUS | Status: AC
  Administered 2023-07-09: 150 mg via INTRAVENOUS
  Filled 2023-07-09: qty 150

## 2023-07-09 MED ORDER — CISPLATIN CHEMO INJECTION 100MG/100ML
20.0000 mg/m2 | Freq: Once | INTRAVENOUS | Status: AC
  Administered 2023-07-09: 50 mg via INTRAVENOUS
  Filled 2023-07-09: qty 50

## 2023-07-09 MED ORDER — SODIUM CHLORIDE 0.9 % IV SOLN
100.0000 mg/m2 | Freq: Once | INTRAVENOUS | Status: AC
  Administered 2023-07-09: 240 mg via INTRAVENOUS
  Filled 2023-07-09: qty 12

## 2023-07-09 MED ORDER — BIKTARVY 50-200-25 MG PO TABS
1.0000 | ORAL_TABLET | Freq: Every day | ORAL | 2 refills | Status: DC
Start: 2023-07-09 — End: 2023-10-16

## 2023-07-09 MED ORDER — SODIUM CHLORIDE 0.9% FLUSH
10.0000 mL | Freq: Once | INTRAVENOUS | Status: AC
  Administered 2023-07-09: 10 mL

## 2023-07-09 NOTE — Telephone Encounter (Signed)
[facility] Patient Advocate Encounter   Was successful in obtaining a [employer] copay card for [facility].  This copay card will make the patients copay $0.  I have spoken with the patient.    The billing information is as follows and has been shared with [facility].  RxBin: [id number] PCN: [id number] Member ID: [id number] Group ID: [id number]    Kae Heller, CPhT Specialty Pharmacy Patient [facility] Phone: [phone] Fax:  [phone]

## 2023-07-09 NOTE — Patient Instructions (Signed)
[facility] - A DEPT OF [facility]  Discharge Instructions: Thank you for choosing [facility] to provide your oncology and hematology care.   If you have a lab appointment with the Cancer Center, please go directly to the Cancer Center and check in at the registration area.   Wear comfortable clothing and clothing appropriate for easy access to any Portacath or PICC line.   We strive to give you quality time with your provider. You may need to reschedule your appointment if you arrive late (15 or more minutes).  Arriving late affects you and other patients whose appointments are after yours.  Also, if you miss three or more appointments without notifying the office, you may be dismissed from the clinic at the provider's discretion.      For prescription refill requests, have your pharmacy contact our office and allow 72 hours for refills to be completed.    Today you received the following chemotherapy and/or immunotherapy agents: Cisplatin & Etoposide      To help prevent nausea and vomiting after your treatment, we encourage you to take your nausea medication as directed.  BELOW ARE SYMPTOMS THAT SHOULD BE REPORTED IMMEDIATELY: *FEVER GREATER THAN 100.4 F (38 C) OR HIGHER *CHILLS OR SWEATING *NAUSEA AND VOMITING THAT IS NOT CONTROLLED WITH YOUR NAUSEA MEDICATION *UNUSUAL SHORTNESS OF BREATH *UNUSUAL BRUISING OR BLEEDING *URINARY PROBLEMS (pain or burning when urinating, or frequent urination) *BOWEL PROBLEMS (unusual diarrhea, constipation, pain near the anus) TENDERNESS IN MOUTH AND THROAT WITH OR WITHOUT PRESENCE OF ULCERS (sore throat, sores in mouth, or a toothache) UNUSUAL RASH, SWELLING OR PAIN  UNUSUAL VAGINAL DISCHARGE OR ITCHING   Items with * indicate a potential emergency and should be followed up as soon as possible or go to the Emergency Department if any problems should occur.  Please show the CHEMOTHERAPY ALERT CARD or  IMMUNOTHERAPY ALERT CARD at check-in to the Emergency Department and triage nurse.  Should you have questions after your visit or need to cancel or reschedule your appointment, please contact [facility] - A DEPT OF [facility]  Dept: [phone]  and follow the prompts.  Office hours are 8:00 a.m. to 4:30 p.m. Monday - Friday. Please note that voicemails left after 4:00 p.m. may not be returned until the following business day.  We are closed weekends and major holidays. You have access to a nurse at all times for urgent questions. Please call the main number to the clinic Dept: [phone] and follow the prompts.   For any non-urgent questions, you may also contact your provider using MyChart. We now offer e-Visits for anyone 38 and older to request care online for non-urgent symptoms. For details visit [email or web address].   Also download the MyChart app! Go to the app store, search "MyChart", open the app, select [facility], and log in with your MyChart username and password.  Cisplatin Injection What is this medication? CISPLATIN (SIS pla tin) treats some types of cancer. It works by slowing down the growth of cancer cells. This medicine may be used for other purposes; ask your health care provider or pharmacist if you have questions. COMMON BRAND NAME(S): Platinol, Platinol -AQ What should I tell my care team before I take this medication? They need to know if you have any of these conditions: Eye disease, vision problems Hearing problems Kidney disease Low blood counts, such as low white cells, platelets, or red blood cells  Tingling of the fingers or toes, or other nerve disorder An unusual or allergic reaction to cisplatin, carboplatin, oxaliplatin, other medications, foods, dyes, or preservatives If you or your partner are pregnant or trying to get pregnant Breast-feeding How should I use this medication? This medication is injected into a  vein. It is given by your care team in a hospital or clinic setting. Talk to your care team about the use of this medication in children. Special care may be needed. Overdosage: If you think you have taken too much of this medicine contact a poison control center or emergency room at once. NOTE: This medicine is only for you. Do not share this medicine with others. What if I miss a dose? Keep appointments for follow-up doses. It is important not to miss your dose. Call your care team if you are unable to keep an appointment. What may interact with this medication? Do not take this medication with any of the following: Live virus vaccines This medication may also interact with the following: Certain antibiotics, such as amikacin, gentamicin, neomycin, polymyxin B, streptomycin, tobramycin, vancomycin Foscarnet This list may not describe all possible interactions. Give your health care provider a list of all the medicines, herbs, non-prescription drugs, or dietary supplements you use. Also tell them if you smoke, drink alcohol, or use illegal drugs. Some items may interact with your medicine. What should I watch for while using this medication? Your condition will be monitored carefully while you are receiving this medication. You may need blood work done while taking this medication. This medication may make you feel generally unwell. This is not uncommon, as chemotherapy can affect healthy cells as well as cancer cells. Report any side effects. Continue your course of treatment even though you feel ill unless your care team tells you to stop. This medication may increase your risk of getting an infection. Call your care team for advice if you get a fever, chills, sore throat, or other symptoms of a cold or flu. Do not treat yourself. Try to avoid being around people who are sick. Avoid taking medications that contain aspirin, acetaminophen, ibuprofen, naproxen, or ketoprofen unless instructed by  your care team. These medications may hide a fever. This medication may increase your risk to bruise or bleed. Call your care team if you notice any unusual bleeding. Be careful brushing or flossing your teeth or using a toothpick because you may get an infection or bleed more easily. If you have any dental work done, tell your dentist you are receiving this medication. Drink fluids as directed while you are taking this medication. This will help protect your kidneys. Call your care team if you get diarrhea. Do not treat yourself. Talk to your care team if you or your partner wish to become pregnant or think you might be pregnant. This medication can cause serious birth defects if taken during pregnancy and for 14 months after the last dose. A negative pregnancy test is required before starting this medication. A reliable form of contraception is recommended while taking this medication and for 14 months after the last dose. Talk to your care team about effective forms of contraception. Do not father a child while taking this medication and for 11 months after the last dose. Use a condom during sex during this time period. Do not breast-feed while taking this medication. This medication may cause infertility. Talk to your care team if you are concerned about your fertility. What side effects may I notice from  receiving this medication? Side effects that you should report to your care team as soon as possible: Allergic reactions--skin rash, itching, hives, swelling of the face, lips, tongue, or throat Eye pain, change in vision, vision loss Hearing loss, ringing in ears Infection--fever, chills, cough, sore throat, wounds that don't heal, pain or trouble when passing urine, general feeling of discomfort or being unwell Kidney injury--decrease in the amount of urine, swelling of the ankles, hands, or feet Low red blood cell level--unusual weakness or fatigue, dizziness, headache, trouble  breathing Painful swelling, warmth, or redness of the skin, blisters or sores at the infusion site Pain, tingling, or numbness in the hands or feet Unusual bruising or bleeding Side effects that usually do not require medical attention (report to your care team if they continue or are bothersome): Hair loss Nausea Vomiting This list may not describe all possible side effects. Call your doctor for medical advice about side effects. You may report side effects to FDA at 1-800-FDA-1088. Where should I keep my medication? This medication is given in a hospital or clinic. It will not be stored at home. NOTE: This sheet is a summary. It may not cover all possible information. If you have questions about this medicine, talk to your doctor, pharmacist, or health care provider.  2024 Elsevier/Gold Standard (2021-12-09 00:00:00) Etoposide Injection What is this medication? ETOPOSIDE (e toe POE side) treats some types of cancer. It works by slowing down the growth of cancer cells. This medicine may be used for other purposes; ask your health care provider or pharmacist if you have questions. COMMON BRAND NAME(S): Etopophos, Toposar, VePesid What should I tell my care team before I take this medication? They need to know if you have any of these conditions: Infection Kidney disease Liver disease Low blood counts, such as low white cell, platelet, red cell counts An unusual or allergic reaction to etoposide, other medications, foods, dyes, or preservatives If you or your partner are pregnant or trying to get pregnant Breastfeeding How should I use this medication? This medication is injected into a vein. It is given by your care team in a hospital or clinic setting. Talk to your care team about the use of this medication in children. Special care may be needed. Overdosage: If you think you have taken too much of this medicine contact a poison control center or emergency room at once. NOTE: This  medicine is only for you. Do not share this medicine with others. What if I miss a dose? Keep appointments for follow-up doses. It is important not to miss your dose. Call your care team if you are unable to keep an appointment. What may interact with this medication? Warfarin This list may not describe all possible interactions. Give your health care provider a list of all the medicines, herbs, non-prescription drugs, or dietary supplements you use. Also tell them if you smoke, drink alcohol, or use illegal drugs. Some items may interact with your medicine. What should I watch for while using this medication? Your condition will be monitored carefully while you are receiving this medication. This medication may make you feel generally unwell. This is not uncommon as chemotherapy can affect healthy cells as well as cancer cells. Report any side effects. Continue your course of treatment even though you feel ill unless your care team tells you to stop. This medication can cause serious side effects. To reduce the risk, your care team may give you other medications to take before receiving this one.  Be sure to follow the directions from your care team. This medication may increase your risk of getting an infection. Call your care team for advice if you get a fever, chills, sore throat, or other symptoms of a cold or flu. Do not treat yourself. Try to avoid being around people who are sick. This medication may increase your risk to bruise or bleed. Call your care team if you notice any unusual bleeding. Talk to your care team about your risk of cancer. You may be more at risk for certain types of cancers if you take this medication. Talk to your care team if you may be pregnant. Serious birth defects can occur if you take this medication during pregnancy and for 6 months after the last dose. You will need a negative pregnancy test before starting this medication. Contraception is recommended while taking  this medication and for 6 months after the last dose. Your care team can help you find the option that works for you. If your partner can get pregnant, use a condom during sex while taking this medication and for 4 months after the last dose. Do not breastfeed while taking this medication. This medication may cause infertility. Talk to your care team if you are concerned about your fertility. What side effects may I notice from receiving this medication? Side effects that you should report to your care team as soon as possible: Allergic reactions--skin rash, itching, hives, swelling of the face, lips, tongue, or throat Infection--fever, chills, cough, sore throat, wounds that don't heal, pain or trouble when passing urine, general feeling of discomfort or being unwell Low red blood cell level--unusual weakness or fatigue, dizziness, headache, trouble breathing Unusual bruising or bleeding Side effects that usually do not require medical attention (report to your care team if they continue or are bothersome): Diarrhea Fatigue Hair loss Loss of appetite Nausea Vomiting This list may not describe all possible side effects. Call your doctor for medical advice about side effects. You may report side effects to FDA at 1-800-FDA-1088. Where should I keep my medication? This medication is given in a hospital or clinic. It will not be stored at home. NOTE: This sheet is a summary. It may not cover all possible information. If you have questions about this medicine, talk to your doctor, pharmacist, or health care provider.  2024 Elsevier/Gold Standard (2021-12-29 00:00:00)

## 2023-07-10 ENCOUNTER — Inpatient Hospital Stay: Payer: Medicaid Other

## 2023-07-10 ENCOUNTER — Other Ambulatory Visit (HOSPITAL_COMMUNITY): Payer: Self-pay

## 2023-07-10 VITALS — BP 123/68 | HR 99 | Temp 98.0°F

## 2023-07-10 DIAGNOSIS — Z5111 Encounter for antineoplastic chemotherapy: Secondary | ICD-10-CM | POA: Diagnosis not present

## 2023-07-10 DIAGNOSIS — C6292 Malignant neoplasm of left testis, unspecified whether descended or undescended: Secondary | ICD-10-CM

## 2023-07-10 LAB — BETA HCG QUANT (REF LAB): hCG Quant: <1 m[IU]/mL (ref 0–3)

## 2023-07-10 LAB — AFP TUMOR MARKER: AFP, Serum, Tumor Marker: <1.8 ng/mL (ref 0.0–6.9)

## 2023-07-10 MED ORDER — HEPARIN SOD (PORK) LOCK FLUSH 100 UNIT/ML IV SOLN
500.0000 [IU] | Freq: Once | INTRAVENOUS | Status: AC | PRN
  Administered 2023-07-10: 500 [IU]

## 2023-07-10 MED ORDER — SODIUM CHLORIDE 0.9 % IV SOLN
20.0000 mg/m2 | Freq: Once | INTRAVENOUS | Status: AC
  Administered 2023-07-10: 50 mg via INTRAVENOUS
  Filled 2023-07-10: qty 50

## 2023-07-10 MED ORDER — POTASSIUM CHLORIDE IN NACL 20-0.9 MEQ/L-% IV SOLN
Freq: Once | INTRAVENOUS | Status: AC
  Filled 2023-07-10: qty 1000

## 2023-07-10 MED ORDER — SODIUM CHLORIDE 0.9 % IV SOLN: Freq: Once | INTRAVENOUS | Status: AC

## 2023-07-10 MED ORDER — MAGNESIUM SULFATE 2 GM/50ML IV SOLN
2.0000 g | Freq: Once | INTRAVENOUS | Status: AC
Start: 2023-07-10 — End: 2023-07-10
  Administered 2023-07-10: 2 g via INTRAVENOUS
  Filled 2023-07-10: qty 50

## 2023-07-10 MED ORDER — DEXAMETHASONE SODIUM PHOSPHATE 10 MG/ML IJ SOLN
10.0000 mg | Freq: Once | INTRAMUSCULAR | Status: AC
Start: 2023-07-10 — End: 2023-07-10
  Administered 2023-07-10: 10 mg via INTRAVENOUS
  Filled 2023-07-10: qty 1

## 2023-07-10 MED ORDER — ETOPOSIDE CHEMO INJECTION 1 GM/50ML
100.0000 mg/m2 | Freq: Once | INTRAVENOUS | Status: AC
  Administered 2023-07-10: 240 mg via INTRAVENOUS
  Filled 2023-07-10: qty 12

## 2023-07-10 MED ORDER — SODIUM CHLORIDE 0.9% FLUSH
10.0000 mL | INTRAVENOUS | Status: DC | PRN
Start: 2023-07-10 — End: 2023-07-10
  Administered 2023-07-10: 10 mL

## 2023-07-10 MED ORDER — SODIUM CHLORIDE 0.9 % IV SOLN
30.0000 [IU] | Freq: Once | INTRAVENOUS | Status: AC
  Administered 2023-07-10: 30 [IU] via INTRAVENOUS
  Filled 2023-07-10: qty 10

## 2023-07-10 MED FILL — Fosaprepitant Dimeglumine For IV Infusion 150 MG (Base Eq): INTRAVENOUS | Qty: 5 | Status: AC

## 2023-07-10 NOTE — Patient Instructions (Signed)
[facility] - [facility] OF [facility]  Discharge Instructions: Thank you for choosing [facility] to provide your oncology and hematology care.   If you have a lab appointment with the Cancer Center, please go directly to the Cancer Center and check in at the registration area.   Wear comfortable clothing and clothing appropriate for easy access to any Portacath or PICC line.   We strive to give you quality time with your provider. You may need to reschedule your appointment if you arrive late (15 or more minutes).  Arriving late affects you and other patients whose appointments are after yours.  Also, if you miss three or more appointments without notifying the office, you may be dismissed from the clinic at the provider's discretion.      For prescription refill requests, have your pharmacy contact our office and allow 72 hours for refills to be completed.    Today you received the following chemotherapy and/or immunotherapy agents: Bleomycin, Cisplatin & Etoposide      To help prevent nausea and vomiting after your treatment, we encourage you to take your nausea medication as directed.  BELOW ARE SYMPTOMS THAT SHOULD BE REPORTED IMMEDIATELY: *FEVER GREATER THAN 100.4 F (38 C) OR HIGHER *CHILLS OR SWEATING *NAUSEA AND VOMITING THAT IS NOT CONTROLLED WITH YOUR NAUSEA MEDICATION *UNUSUAL SHORTNESS OF BREATH *UNUSUAL BRUISING OR BLEEDING *URINARY PROBLEMS (pain or burning when urinating, or frequent urination) *BOWEL PROBLEMS (unusual diarrhea, constipation, pain near the anus) TENDERNESS IN MOUTH AND THROAT WITH OR WITHOUT PRESENCE OF ULCERS (sore throat, sores in mouth, or a toothache) UNUSUAL RASH, SWELLING OR PAIN  UNUSUAL VAGINAL DISCHARGE OR ITCHING   Items with * indicate a potential emergency and should be followed up as soon as possible or go to the Emergency Department if any problems should occur.  Please show the CHEMOTHERAPY  ALERT CARD or IMMUNOTHERAPY ALERT CARD at check-in to the Emergency Department and triage nurse.  Should you have questions after your visit or need to cancel or reschedule your appointment, please contact [facility] - A DEPT OF [facility]  Dept: [phone]  and follow the prompts.  Office hours are 8:00 a.m. to 4:30 p.m. Monday - Friday. Please note that voicemails left after 4:00 p.m. may not be returned until the following business day.  We are closed weekends and major holidays. You have access to a nurse at all times for urgent questions. Please call the main number to the clinic Dept: [phone] and follow the prompts.   For any non-urgent questions, you may also contact your provider using MyChart. We now offer e-Visits for anyone 53 and older to request care online for non-urgent symptoms. For details visit [email or web address].   Also download the MyChart app! Go to the app store, search "MyChart", open the app, select [facility], and log in with your MyChart username and password.  Bleomycin Injection What is this medication? BLEOMYCIN (blee oh MYE sin) treats some types of cancer. It works by slowing down the growth of cancer cells. It may also be used to prevent and treat the buildup of fluid around the lungs that can be caused by cancer. It works by helping your body create less space around your lungs, so that fluid cannot build up. This medicine may be used for other purposes; ask your health care provider or pharmacist if you have questions. COMMON BRAND NAME(S): Blenoxane What should I tell  my care team before I take this medication? They need to know if you have any of these conditions: Kidney disease Lung disease Recent or ongoing radiation therapy Tobacco use An unusual or allergic reaction to bleomycin, other medications, foods, dyes, or preservatives Pregnant or trying to get pregnant Breastfeeding How should I use this  medication? This medication is injected into a vein or a body cavity. It can also be injected into a muscle or under the skin. It is given by your care team in a hospital or clinic setting. Talk to your care team about the use of this medication in children. Special care may be needed. Overdosage: If you think you have taken too much of this medicine contact a poison control center or emergency room at once. NOTE: This medicine is only for you. Do not share this medicine with others. What if I miss a dose? Keep appointments for follow-up doses. It is important not to miss your dose. Call your care team if you are unable to keep an appointment. What may interact with this medication? Do not take this medication with any of the following: Brentuximab vedotin This medication may also interact with the following: Certain antibiotics Cisplatin Cyclosporine Diuretics Foscarnet Medications to increase blood counts, such as filgrastim, pegfilgrastim, sargramostim Vaccines This list may not describe all possible interactions. Give your health care provider a list of all the medicines, herbs, non-prescription drugs, or dietary supplements you use. Also tell them if you smoke, drink alcohol, or use illegal drugs. Some items may interact with your medicine. What should I watch for while using this medication? Your condition will be monitored carefully while you are receiving this medication. This medication may make you feel generally unwell. This is not uncommon as chemotherapy can affect healthy cells as well as cancer cells. Report any side effects. Continue your course of treatment even though you feel ill unless your care team tells you to stop. This medication may increase your risk of getting an infection. Call your care team for advice if you get a fever, chills, sore throat, or other symptoms of a cold or flu. Do not treat yourself. Try to avoid being around people who are sick. Avoid taking  medications that contain aspirin, acetaminophen, ibuprofen, naproxen, or ketoprofen unless instructed by your care team. These medications may hide a fever. Talk to your care team if you may be pregnant. Serious birth defects can occur if you take this medication during pregnancy. Contraception is recommended while taking this medication. Your care team can help you find the option that works for you. Do not breastfeed while taking this medication. There is a maximum amount of this medication you should receive throughout your life. The amount depends on the medical condition being treated and your overall health. Your care team will watch how much of this medication you receive. Tell your care team if you have taken this medication before. What side effects may I notice from receiving this medication? Side effects that you should report to your care team as soon as possible: Allergic reactions--skin rash, itching, hives, swelling of the face, lips, tongue, or throat Dry cough, shortness of breath or trouble breathing Infusion reactions--chest pain, shortness of breath or trouble breathing, feeling faint or lightheaded Kidney injury--decrease in the amount of urine, swelling of the ankles, hands, or feet Liver injury--right upper belly pain, loss of appetite, nausea, light-colored stool, dark yellow or brown urine, yellowing skin or eyes, unusual weakness or fatigue Side effects that  usually do not require medical attention (report to your care team if they continue or are bothersome): Change in nail shape, thickness, or color Change in skin color Hair loss Loss of appetite with weight loss Pain, redness, or irritation at injection site Vomiting This list may not describe all possible side effects. Call your doctor for medical advice about side effects. You may report side effects to FDA at 1-800-FDA-1088. Where should I keep my medication? This medication is given in a hospital or clinic. It  will not be stored at home. NOTE: This sheet is a summary. It may not cover all possible information. If you have questions about this medicine, talk to your doctor, pharmacist, or health care provider.  2024 Elsevier/Gold Standard (2022-01-02 00:00:00)

## 2023-07-11 ENCOUNTER — Other Ambulatory Visit (HOSPITAL_COMMUNITY): Payer: Self-pay

## 2023-07-11 ENCOUNTER — Inpatient Hospital Stay: Payer: Medicaid Other

## 2023-07-11 VITALS — BP 126/80 | HR 62 | Resp 16

## 2023-07-11 DIAGNOSIS — C6292 Malignant neoplasm of left testis, unspecified whether descended or undescended: Secondary | ICD-10-CM

## 2023-07-11 DIAGNOSIS — Z5111 Encounter for antineoplastic chemotherapy: Secondary | ICD-10-CM | POA: Diagnosis not present

## 2023-07-11 MED ORDER — POTASSIUM CHLORIDE IN NACL 20-0.9 MEQ/L-% IV SOLN
Freq: Once | INTRAVENOUS | Status: AC
  Filled 2023-07-11: qty 1000

## 2023-07-11 MED ORDER — SODIUM CHLORIDE 0.9 % IV SOLN: Freq: Once | INTRAVENOUS | Status: AC

## 2023-07-11 MED ORDER — HEPARIN SOD (PORK) LOCK FLUSH 100 UNIT/ML IV SOLN
500.0000 [IU] | Freq: Once | INTRAVENOUS | Status: AC | PRN
  Administered 2023-07-11: 500 [IU]

## 2023-07-11 MED ORDER — MAGNESIUM SULFATE 2 GM/50ML IV SOLN
2.0000 g | Freq: Once | INTRAVENOUS | Status: AC
  Administered 2023-07-11: 2 g via INTRAVENOUS
  Filled 2023-07-11: qty 50

## 2023-07-11 MED ORDER — PALONOSETRON HCL INJECTION 0.25 MG/5ML
0.2500 mg | Freq: Once | INTRAVENOUS | Status: AC
Start: 2023-07-11 — End: 2023-07-11
  Administered 2023-07-11: 0.25 mg via INTRAVENOUS
  Filled 2023-07-11: qty 5

## 2023-07-11 MED ORDER — DEXAMETHASONE SODIUM PHOSPHATE 10 MG/ML IJ SOLN
10.0000 mg | Freq: Once | INTRAMUSCULAR | Status: AC
Start: 2023-07-11 — End: 2023-07-11
  Administered 2023-07-11: 10 mg via INTRAVENOUS
  Filled 2023-07-11: qty 1

## 2023-07-11 MED ORDER — SODIUM CHLORIDE 0.9 % IV SOLN
100.0000 mg/m2 | Freq: Once | INTRAVENOUS | Status: AC
  Administered 2023-07-11: 240 mg via INTRAVENOUS
  Filled 2023-07-11: qty 12

## 2023-07-11 MED ORDER — SODIUM CHLORIDE 0.9 % IV SOLN
20.0000 mg/m2 | Freq: Once | INTRAVENOUS | Status: AC
  Administered 2023-07-11: 50 mg via INTRAVENOUS
  Filled 2023-07-11: qty 50

## 2023-07-11 MED ORDER — SODIUM CHLORIDE 0.9 % IV SOLN
150.0000 mg | Freq: Once | INTRAVENOUS | Status: AC
  Administered 2023-07-11: 150 mg via INTRAVENOUS
  Filled 2023-07-11: qty 150

## 2023-07-11 MED ORDER — SODIUM CHLORIDE 0.9% FLUSH
10.0000 mL | INTRAVENOUS | Status: DC | PRN
  Administered 2023-07-11: 10 mL

## 2023-07-11 NOTE — Patient Instructions (Signed)
 [facility]  Discharge Instructions: Thank you for choosing [facility] to provide your oncology and hematology care.   If you have a lab appointment with the Cancer Center, please go directly to the Cancer Center and check in at the registration area.   Wear comfortable clothing and clothing appropriate for easy access to any Portacath or PICC line.   We strive to give you quality time with your provider. You may need to reschedule your appointment if you arrive late (15 or more minutes).  Arriving late affects you and other patients whose appointments are after yours.  Also, if you miss three or more appointments without notifying the office, you may be dismissed from the clinic at the provider's discretion.      For prescription refill requests, have your pharmacy contact our office and allow 72 hours for refills to be completed.    Today you received the following chemotherapy and/or immunotherapy agents Cisplatin & Etoposide      To help prevent nausea and vomiting after your treatment, we encourage you to take your nausea medication as directed.  BELOW ARE SYMPTOMS THAT SHOULD BE REPORTED IMMEDIATELY: *FEVER GREATER THAN 100.4 F (38 C) OR HIGHER *CHILLS OR SWEATING *NAUSEA AND VOMITING THAT IS NOT CONTROLLED WITH YOUR NAUSEA MEDICATION *UNUSUAL SHORTNESS OF BREATH *UNUSUAL BRUISING OR BLEEDING *URINARY PROBLEMS (pain or burning when urinating, or frequent urination) *BOWEL PROBLEMS (unusual diarrhea, constipation, pain near the anus) TENDERNESS IN MOUTH AND THROAT WITH OR WITHOUT PRESENCE OF ULCERS (sore throat, sores in mouth, or a toothache) UNUSUAL RASH, SWELLING OR PAIN  UNUSUAL VAGINAL DISCHARGE OR ITCHING   Items with * indicate a potential emergency and should be followed up as soon as possible or go to the Emergency Department if any problems should occur.  Please show the CHEMOTHERAPY ALERT CARD or  IMMUNOTHERAPY ALERT CARD at check-in to the Emergency Department and triage nurse.  Should you have questions after your visit or need to cancel or reschedule your appointment, please contact [facility] - A DEPT OF [facility]  Dept: [phone]  and follow the prompts.  Office hours are 8:00 a.m. to 4:30 p.m. Monday - Friday. Please note that voicemails left after 4:00 p.m. may not be returned until the following business day.  We are closed weekends and major holidays. You have access to a nurse at all times for urgent questions. Please call the main number to the clinic Dept: [phone] and follow the prompts.   For any non-urgent questions, you may also contact your provider using MyChart. We now offer e-Visits for anyone 28 and older to request care online for non-urgent symptoms. For details visit [email or web address].   Also download the MyChart app! Go to the app store, search "MyChart", open the app, select , and log in with your MyChart username and password.

## 2023-07-12 ENCOUNTER — Other Ambulatory Visit (HOSPITAL_COMMUNITY): Payer: Self-pay

## 2023-07-12 ENCOUNTER — Other Ambulatory Visit: Payer: Self-pay

## 2023-07-12 ENCOUNTER — Inpatient Hospital Stay: Payer: 59

## 2023-07-12 VITALS — BP 143/79 | HR 80 | Temp 97.8°F | Resp 14

## 2023-07-12 DIAGNOSIS — Z5111 Encounter for antineoplastic chemotherapy: Secondary | ICD-10-CM | POA: Diagnosis not present

## 2023-07-12 DIAGNOSIS — C6292 Malignant neoplasm of left testis, unspecified whether descended or undescended: Secondary | ICD-10-CM

## 2023-07-12 MED ORDER — POTASSIUM CHLORIDE IN NACL 20-0.9 MEQ/L-% IV SOLN
Freq: Once | INTRAVENOUS | Status: AC
  Filled 2023-07-12: qty 1000

## 2023-07-12 MED ORDER — FAMOTIDINE IN NACL 20-0.9 MG/50ML-% IV SOLN
20.0000 mg | Freq: Once | INTRAVENOUS | Status: AC
  Administered 2023-07-12: 20 mg via INTRAVENOUS
  Filled 2023-07-12: qty 50

## 2023-07-12 MED ORDER — DEXAMETHASONE SODIUM PHOSPHATE 10 MG/ML IJ SOLN
10.0000 mg | Freq: Once | INTRAMUSCULAR | Status: AC
Start: 2023-07-12 — End: 2023-07-12
  Administered 2023-07-12: 10 mg via INTRAVENOUS
  Filled 2023-07-12: qty 1

## 2023-07-12 MED ORDER — FAMOTIDINE 20 MG PO TABS
20.0000 mg | ORAL_TABLET | Freq: Two times a day (BID) | ORAL | 2 refills | Status: AC
  Filled 2023-07-12: qty 30, 15d supply, fill #0
  Filled 2023-12-31: qty 30, 15d supply, fill #1

## 2023-07-12 MED ORDER — SODIUM CHLORIDE 0.9 % IV SOLN: Freq: Once | INTRAVENOUS | Status: AC

## 2023-07-12 MED ORDER — ETOPOSIDE CHEMO INJECTION 1 GM/50ML
100.0000 mg/m2 | Freq: Once | INTRAVENOUS | Status: AC
  Administered 2023-07-12: 240 mg via INTRAVENOUS
  Filled 2023-07-12: qty 12

## 2023-07-12 MED ORDER — SODIUM CHLORIDE 0.9 % IV SOLN
20.0000 mg/m2 | Freq: Once | INTRAVENOUS | Status: AC
  Administered 2023-07-12: 50 mg via INTRAVENOUS
  Filled 2023-07-12: qty 50

## 2023-07-12 MED ORDER — SODIUM CHLORIDE 0.9% FLUSH
10.0000 mL | INTRAVENOUS | Status: DC | PRN
  Administered 2023-07-12: 10 mL

## 2023-07-12 MED ORDER — MAGNESIUM SULFATE 2 GM/50ML IV SOLN
2.0000 g | Freq: Once | INTRAVENOUS | Status: AC
Start: 2023-07-12 — End: 2023-07-12
  Administered 2023-07-12: 2 g via INTRAVENOUS
  Filled 2023-07-12: qty 50

## 2023-07-12 MED ORDER — HEPARIN SOD (PORK) LOCK FLUSH 100 UNIT/ML IV SOLN
500.0000 [IU] | Freq: Once | INTRAVENOUS | Status: AC | PRN
  Administered 2023-07-12: 500 [IU]

## 2023-07-12 MED FILL — Fosaprepitant Dimeglumine For IV Infusion 150 MG (Base Eq): INTRAVENOUS | Qty: 5 | Status: AC

## 2023-07-12 NOTE — Patient Instructions (Signed)
[facility]  Discharge Instructions: Thank you for choosing [facility] to provide your oncology and hematology care.   If you have a lab appointment with the Cancer Center, please go directly to the Cancer Center and check in at the registration area.   Wear comfortable clothing and clothing appropriate for easy access to any Portacath or PICC line.   We strive to give you quality time with your provider. You may need to reschedule your appointment if you arrive late (15 or more minutes).  Arriving late affects you and other patients whose appointments are after yours.  Also, if you miss three or more appointments without notifying the office, you may be dismissed from the clinic at the provider's discretion.      For prescription refill requests, have your pharmacy contact our office and allow 72 hours for refills to be completed.    Today you received the following chemotherapy and/or immunotherapy agents: cisplatin and etoposide      To help prevent nausea and vomiting after your treatment, we encourage you to take your nausea medication as directed.  BELOW ARE SYMPTOMS THAT SHOULD BE REPORTED IMMEDIATELY: *FEVER GREATER THAN 100.4 F (38 C) OR HIGHER *CHILLS OR SWEATING *NAUSEA AND VOMITING THAT IS NOT CONTROLLED WITH YOUR NAUSEA MEDICATION *UNUSUAL SHORTNESS OF BREATH *UNUSUAL BRUISING OR BLEEDING *URINARY PROBLEMS (pain or burning when urinating, or frequent urination) *BOWEL PROBLEMS (unusual diarrhea, constipation, pain near the anus) TENDERNESS IN MOUTH AND THROAT WITH OR WITHOUT PRESENCE OF ULCERS (sore throat, sores in mouth, or a toothache) UNUSUAL RASH, SWELLING OR PAIN  UNUSUAL VAGINAL DISCHARGE OR ITCHING   Items with * indicate a potential emergency and should be followed up as soon as possible or go to the Emergency Department if any problems should occur.  Please show the CHEMOTHERAPY ALERT CARD or  IMMUNOTHERAPY ALERT CARD at check-in to the Emergency Department and triage nurse.  Should you have questions after your visit or need to cancel or reschedule your appointment, please contact [facility] - A DEPT OF [facility]  Dept: [phone]  and follow the prompts.  Office hours are 8:00 a.m. to 4:30 p.m. Monday - Friday. Please note that voicemails left after 4:00 p.m. may not be returned until the following business day.  We are closed weekends and major holidays. You have access to a nurse at all times for urgent questions. Please call the main number to the clinic Dept: [phone] and follow the prompts.   For any non-urgent questions, you may also contact your provider using MyChart. We now offer e-Visits for anyone 62 and older to request care online for non-urgent symptoms. For details visit [email or web address].   Also download the MyChart app! Go to the app store, search "MyChart", open the app, select [facility], and log in with your MyChart username and password.

## 2023-07-13 ENCOUNTER — Inpatient Hospital Stay: Payer: Medicaid Other

## 2023-07-13 VITALS — BP 122/95 | HR 76 | Temp 97.3°F | Resp 18 | Wt 264.5 lb

## 2023-07-13 DIAGNOSIS — C6292 Malignant neoplasm of left testis, unspecified whether descended or undescended: Secondary | ICD-10-CM

## 2023-07-13 DIAGNOSIS — Z5111 Encounter for antineoplastic chemotherapy: Secondary | ICD-10-CM | POA: Diagnosis not present

## 2023-07-13 MED ORDER — POTASSIUM CHLORIDE IN NACL 20-0.9 MEQ/L-% IV SOLN
Freq: Once | INTRAVENOUS | Status: AC
  Filled 2023-07-13: qty 1000

## 2023-07-13 MED ORDER — SODIUM CHLORIDE 0.9 % IV SOLN
100.0000 mg/m2 | Freq: Once | INTRAVENOUS | Status: AC
  Administered 2023-07-13: 240 mg via INTRAVENOUS
  Filled 2023-07-13: qty 12

## 2023-07-13 MED ORDER — SODIUM CHLORIDE 0.9 % IV SOLN
150.0000 mg | Freq: Once | INTRAVENOUS | Status: AC
  Administered 2023-07-13: 150 mg via INTRAVENOUS
  Filled 2023-07-13: qty 150

## 2023-07-13 MED ORDER — SODIUM CHLORIDE 0.9% FLUSH
10.0000 mL | INTRAVENOUS | Status: DC | PRN
  Administered 2023-07-13: 10 mL

## 2023-07-13 MED ORDER — SODIUM CHLORIDE 0.9 % IV SOLN
20.0000 mg/m2 | Freq: Once | INTRAVENOUS | Status: AC
  Administered 2023-07-13: 50 mg via INTRAVENOUS
  Filled 2023-07-13: qty 50

## 2023-07-13 MED ORDER — MAGNESIUM SULFATE 2 GM/50ML IV SOLN
2.0000 g | Freq: Once | INTRAVENOUS | Status: AC
  Administered 2023-07-13: 2 g via INTRAVENOUS
  Filled 2023-07-13: qty 50

## 2023-07-13 MED ORDER — HEPARIN SOD (PORK) LOCK FLUSH 100 UNIT/ML IV SOLN
500.0000 [IU] | Freq: Once | INTRAVENOUS | Status: AC | PRN
  Administered 2023-07-13: 500 [IU]

## 2023-07-13 MED ORDER — PALONOSETRON HCL INJECTION 0.25 MG/5ML
0.2500 mg | Freq: Once | INTRAVENOUS | Status: AC
Start: 2023-07-13 — End: 2023-07-13
  Administered 2023-07-13: 0.25 mg via INTRAVENOUS
  Filled 2023-07-13: qty 5

## 2023-07-13 MED ORDER — DEXAMETHASONE SODIUM PHOSPHATE 10 MG/ML IJ SOLN
10.0000 mg | Freq: Once | INTRAMUSCULAR | Status: AC
Start: 2023-07-13 — End: 2023-07-13
  Administered 2023-07-13: 10 mg via INTRAVENOUS
  Filled 2023-07-13: qty 1

## 2023-07-13 MED ORDER — SODIUM CHLORIDE 0.9 % IV SOLN: Freq: Once | INTRAVENOUS | Status: AC

## 2023-07-13 NOTE — Patient Instructions (Signed)
[facility]  Discharge Instructions: Thank you for choosing [facility] to provide your oncology and hematology care.   If you have a lab appointment with the Cancer Center, please go directly to the Cancer Center and check in at the registration area.   Wear comfortable clothing and clothing appropriate for easy access to any Portacath or PICC line.   We strive to give you quality time with your provider. You may need to reschedule your appointment if you arrive late (15 or more minutes).  Arriving late affects you and other patients whose appointments are after yours.  Also, if you miss three or more appointments without notifying the office, you may be dismissed from the clinic at the provider's discretion.      For prescription refill requests, have your pharmacy contact our office and allow 72 hours for refills to be completed.    Today you received the following chemotherapy and/or immunotherapy agents Cisplatin, Etoposide.      To help prevent nausea and vomiting after your treatment, we encourage you to take your nausea medication as directed.  BELOW ARE SYMPTOMS THAT SHOULD BE REPORTED IMMEDIATELY: *FEVER GREATER THAN 100.4 F (38 C) OR HIGHER *CHILLS OR SWEATING *NAUSEA AND VOMITING THAT IS NOT CONTROLLED WITH YOUR NAUSEA MEDICATION *UNUSUAL SHORTNESS OF BREATH *UNUSUAL BRUISING OR BLEEDING *URINARY PROBLEMS (pain or burning when urinating, or frequent urination) *BOWEL PROBLEMS (unusual diarrhea, constipation, pain near the anus) TENDERNESS IN MOUTH AND THROAT WITH OR WITHOUT PRESENCE OF ULCERS (sore throat, sores in mouth, or a toothache) UNUSUAL RASH, SWELLING OR PAIN  UNUSUAL VAGINAL DISCHARGE OR ITCHING   Items with * indicate a potential emergency and should be followed up as soon as possible or go to the Emergency Department if any problems should occur.  Please show the CHEMOTHERAPY ALERT CARD or  IMMUNOTHERAPY ALERT CARD at check-in to the Emergency Department and triage nurse.  Should you have questions after your visit or need to cancel or reschedule your appointment, please contact [facility] - A DEPT OF [facility]  Dept: [phone]  and follow the prompts.  Office hours are 8:00 a.m. to 4:30 p.m. Monday - Friday. Please note that voicemails left after 4:00 p.m. may not be returned until the following business day.  We are closed weekends and major holidays. You have access to a nurse at all times for urgent questions. Please call the main number to the clinic Dept: [phone] and follow the prompts.   For any non-urgent questions, you may also contact your provider using MyChart. We now offer e-Visits for anyone 68 and older to request care online for non-urgent symptoms. For details visit [email or web address].   Also download the MyChart app! Go to the app store, search "MyChart", open the app, select , and log in with your MyChart username and password.

## 2023-07-16 DIAGNOSIS — Z9189 Other specified personal risk factors, not elsewhere classified: Secondary | ICD-10-CM | POA: Insufficient documentation

## 2023-07-16 NOTE — Assessment & Plan Note (Signed)
Diagnosis: pT2 cN1M0S0 normal TM. 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 Take Compazine 10 mg twice daily on days of chemotherapy

## 2023-07-16 NOTE — Telephone Encounter (Signed)
Called pt to see how he did with his recent treatment.  Noted pt had sent a message to Dr Cherly Hensen regarding swelling of shoulder blades.  Discussed with pt & he reports soreness in shoulder blades.  He denies any trouble breathing or swallowing although he says that his gums are swollen.  Discussed using baking soda, salt water rinses & alternating with Biotene.  Pt took Motrin last night.  Instructed not to take any of the NSAIDS & can take tylenol.  Discussed taking a warm bath & OK to take zofran if needed today.  He reports laying around & trying to rest but also moving around some to prevent further blood clots.  He knows his next appt & how to reach [place].  Informed we would see what Dr Cherly Hensen thinks otherwise.

## 2023-07-16 NOTE — Assessment & Plan Note (Signed)
Last dose of apixaban tonight Precautions on bleeding, low platelets. Recommend CBC, type and screen on day 16 Hold apixaban until new labs next week for pretreatment on day 16.

## 2023-07-16 NOTE — Assessment & Plan Note (Signed)
Monitor for neutropenic fever, signs of bleeding this week.  Report to ED if any concerning symptoms.

## 2023-07-16 NOTE — Telephone Encounter (Signed)
-----   Message from Nurse Ashby Dawes sent at 07/09/2023  4:05 PM EST ----- Regarding: first time/ cisplatin and etoposide/ Dr Cherly Hensen pt Pt had first time cisplatin and etoposide today. Tolerated well.   Thanks!

## 2023-07-17 ENCOUNTER — Inpatient Hospital Stay: Payer: 59

## 2023-07-17 ENCOUNTER — Other Ambulatory Visit (HOSPITAL_COMMUNITY): Payer: Self-pay

## 2023-07-17 ENCOUNTER — Inpatient Hospital Stay (HOSPITAL_BASED_OUTPATIENT_CLINIC_OR_DEPARTMENT_OTHER): Payer: 59

## 2023-07-17 ENCOUNTER — Other Ambulatory Visit: Payer: Self-pay

## 2023-07-17 VITALS — BP 132/93 | HR 100 | Temp 97.4°F | Resp 17 | Wt 253.9 lb

## 2023-07-17 DIAGNOSIS — C6292 Malignant neoplasm of left testis, unspecified whether descended or undescended: Secondary | ICD-10-CM

## 2023-07-17 DIAGNOSIS — K121 Other forms of stomatitis: Secondary | ICD-10-CM | POA: Insufficient documentation

## 2023-07-17 DIAGNOSIS — Z5111 Encounter for antineoplastic chemotherapy: Secondary | ICD-10-CM | POA: Diagnosis not present

## 2023-07-17 DIAGNOSIS — I2699 Other pulmonary embolism without acute cor pulmonale: Secondary | ICD-10-CM | POA: Diagnosis not present

## 2023-07-17 DIAGNOSIS — Z9189 Other specified personal risk factors, not elsewhere classified: Secondary | ICD-10-CM | POA: Diagnosis not present

## 2023-07-17 DIAGNOSIS — G4700 Insomnia, unspecified: Secondary | ICD-10-CM

## 2023-07-17 LAB — CBC WITH DIFFERENTIAL (CANCER CENTER ONLY)
Abs Immature Granulocytes: 0.02 10*3/uL (ref 0.00–0.07)
Basophils Absolute: 0 10*3/uL (ref 0.0–0.1)
Basophils Relative: 0 %
Eosinophils Absolute: 0 10*3/uL (ref 0.0–0.5)
Eosinophils Relative: 1 %
HCT: 40.9 % (ref 39.0–52.0)
Hemoglobin: 14 g/dL (ref 13.0–17.0)
Immature Granulocytes: 1 %
Lymphocytes Relative: 36 %
Lymphs Abs: 1.4 10*3/uL (ref 0.7–4.0)
MCH: 28.5 pg (ref 26.0–34.0)
MCHC: 34.2 g/dL (ref 30.0–36.0)
MCV: 83.3 fL (ref 80.0–100.0)
Monocytes Absolute: 0 10*3/uL — ABNORMAL LOW (ref 0.1–1.0)
Monocytes Relative: 1 %
Neutro Abs: 2.4 10*3/uL (ref 1.7–7.7)
Neutrophils Relative %: 61 %
Platelet Count: 69 10*3/uL — ABNORMAL LOW (ref 150–400)
RBC: 4.91 MIL/uL (ref 4.22–5.81)
RDW: 13.7 % (ref 11.5–15.5)
WBC Count: 3.9 10*3/uL — ABNORMAL LOW (ref 4.0–10.5)
nRBC: 0 % (ref 0.0–0.2)

## 2023-07-17 LAB — CMP (CANCER CENTER ONLY)
ALT: 30 U/L (ref 0–44)
AST: 12 U/L — ABNORMAL LOW (ref 15–41)
Albumin: 3.8 g/dL (ref 3.5–5.0)
Alkaline Phosphatase: 50 U/L (ref 38–126)
Anion gap: 5 (ref 5–15)
BUN: 17 mg/dL (ref 6–20)
CO2: 27 mmol/L (ref 22–32)
Calcium: 8.8 mg/dL — ABNORMAL LOW (ref 8.9–10.3)
Chloride: 103 mmol/L (ref 98–111)
Creatinine: 0.99 mg/dL (ref 0.61–1.24)
GFR, Estimated: >60 mL/min (ref >60)
Glucose, Bld: 105 mg/dL — ABNORMAL HIGH (ref 70–99)
Potassium: 4.2 mmol/L (ref 3.5–5.1)
Sodium: 135 mmol/L (ref 135–145)
Total Bilirubin: 0.5 mg/dL (ref <1.2)
Total Protein: 6.5 g/dL (ref 6.5–8.1)

## 2023-07-17 MED ORDER — TRAZODONE HCL 50 MG PO TABS
50.0000 mg | ORAL_TABLET | Freq: Every day | ORAL | 1 refills | Status: AC
  Filled 2023-07-17: qty 90, 90d supply, fill #0
  Filled 2023-10-12: qty 90, 90d supply, fill #1

## 2023-07-17 MED ORDER — PROCHLORPERAZINE MALEATE 10 MG PO TABS: 10.0000 mg | ORAL_TABLET | Freq: Once | ORAL | Status: DC

## 2023-07-17 MED ORDER — SODIUM CHLORIDE 0.9 % IV SOLN: Freq: Once | INTRAVENOUS | Status: AC

## 2023-07-17 MED ORDER — SODIUM CHLORIDE 0.9% FLUSH
10.0000 mL | INTRAVENOUS | Status: DC | PRN
  Administered 2023-07-17: 10 mL

## 2023-07-17 MED ORDER — SODIUM CHLORIDE 0.9% FLUSH
10.0000 mL | Freq: Once | INTRAVENOUS | Status: AC
  Administered 2023-07-17: 10 mL

## 2023-07-17 MED ORDER — HEPARIN SOD (PORK) LOCK FLUSH 100 UNIT/ML IV SOLN
500.0000 [IU] | Freq: Once | INTRAVENOUS | Status: AC | PRN
Start: 2023-07-17 — End: 2023-07-17
  Administered 2023-07-17: 500 [IU]

## 2023-07-17 MED ORDER — SODIUM CHLORIDE 0.9 % IV SOLN
30.0000 [IU] | Freq: Once | INTRAVENOUS | Status: AC
  Administered 2023-07-17: 30 [IU] via INTRAVENOUS
  Filled 2023-07-17: qty 10

## 2023-07-17 MED ORDER — LIDOCAINE VISCOUS HCL 2 % MT SOLN
15.0000 mL | OROMUCOSAL | 0 refills | Status: AC | PRN
  Filled 2023-07-17: qty 100, 1d supply, fill #0

## 2023-07-17 NOTE — Progress Notes (Signed)
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 Take Compazine 10 mg twice daily on days of chemotherapy  Acute pulmonary embolism (HCC) Last dose of apixaban tonight Precautions on bleeding, low platelets. Recommend CBC, type and screen on day 16 Hold apixaban until new labs next week for pretreatment on day 16.  At risk for side effect of medication Monitor for neutropenic fever, signs of bleeding this week.  Report to ED if any concerning symptoms.  Oral ulcer Lidocaine solution 2% sent to [facility] pharmacy to mix 1:1 with Maalox for mouth pain as needed.   Beware of neutropenic fever and bleeding this week. Report to ED if indicated. Patient educated.  The patient understands the plans discussed today and is in agreement with them.  He knows to contact our office if he develops concerns prior to his next appointment.  [name], MD  [facility] - A DEPT OF [facility] [address] [place] [address] Dept: [phone] Dept Fax: [phone]   No orders of the defined types were placed in this encounter.     CHIEF COMPLAINT:  CC: seminoma  Current Treatment:  BEP  INTERVAL HISTORY:  Journey is here today for repeat clinical assessment. He denies fevers or chills. His appetite is good.  Nausea: taking compazine 2-3 a day and helped.  No diarrhea or constipation. No coughing, chest pain, short of breath. No neuropathy. Left groin pain comes and goes since surgery. No wound. No hearing change. Good urine output. No dark or blood stool on apixaban. Legs are normal feeling now.  Some sore in  the back of mouth. He is using baking soda and salt water.   I have reviewed the past medical history, past surgical history, social history and family history with the patient and they are unchanged from previous note.  ALLERGIES:  has No Known Allergies.  MEDICATIONS:  Current Outpatient Medications  Medication Sig Dispense Refill   lidocaine (XYLOCAINE) 2 % solution Use as directed 15 mLs in the mouth or throat every 3 (three) hours as needed for mouth pain. Mix 1 to 1 ratio with Maalox to rinse for pain 100 mL 0   traZODone (DESYREL) 50 MG tablet Take 1 tablet (50 mg total) by mouth at bedtime. 90 tablet 1   acetaminophen (TYLENOL) 650 MG CR tablet Take 1,300 mg by mouth every 8 (eight) hours as needed for pain.     albuterol (VENTOLIN HFA) 108 (90 Base) MCG/ACT inhaler Inhale 1 puff into the lungs every 6 (six) hours as needed for wheezing or shortness of breath.     APIXABAN (ELIQUIS) VTE STARTER PACK (10MG  AND 5MG ) Take as directed on package: start with two-5mg  tablets twice daily for 7 days. On day 8, switch to one-5mg  tablet twice daily. 74 each 0   bictegravir-emtricitabine-tenofovir AF (BIKTARVY) 50-200-25 MG TABS tablet Take 1 tablet by mouth daily.     bictegravir-emtricitabine-tenofovir AF (BIKTARVY) 50-200-25 MG TABS tablet Take 1 tablet by mouth daily. 30 tablet 2   dexamethasone (DECADRON) 4 MG tablet Take 2 tablets (8 mg total) by mouth daily. Start the day  after last cisplatin dose on day 6 of chemo x 3 days.Take with food. (Patient not taking: Reported on 06/21/2023) 30 tablet 1   famotidine (PEPCID) 20 MG tablet Take 1 tablet (20 mg total) by mouth 2 (two) times daily. 30 tablet 2   lidocaine-prilocaine (EMLA) cream Apply to affected area once (Patient not taking: Reported on 06/21/2023) 30 g 3   ondansetron (ZOFRAN) 8 MG tablet Take 1 tablet (8 mg total) by mouth every 8 (eight) hours as needed for nausea or vomiting. Start on the third day after last cisplatin dose on day 8  of chemo (Patient not taking: Reported on 06/21/2023) 30 tablet 1   prochlorperazine (COMPAZINE) 10 MG tablet Take 1 tablet (10 mg total) by mouth every 6 (six) hours as needed for nausea or vomiting. (Patient not taking: Reported on 06/21/2023) 30 tablet 1   No current facility-administered medications for this visit.   Facility-Administered Medications Ordered in Other Visits  Medication Dose Route Frequency Provider Last Rate Last Admin   bleomycin (BLEOCIN) 30 Units in sodium chloride 0.9 % 50 mL chemo infusion  30 Units Intravenous Once [name], MD       heparin lock flush 100 unit/mL  500 Units Intracatheter Once PRN [name], MD       sodium chloride flush (NS) 0.9 % injection 10 mL  10 mL Intracatheter PRN [name], MD        HISTORY OF PRESENT ILLNESS:   Oncology History  Primary seminoma of left testis (HCC)  04/28/2023 Initial Diagnosis   Primary seminoma of left testis [facility]) Presented to ED for worsening testicular pain and presented with 2 months history of testicular mass.   04/28/2023 Imaging   [place] 1. Positive for abnormally enlarged and heterogeneous but vascular Left Testis highly suspicious for Testicular Neoplasm. Recommend Urology consultation. 2. Negative for testicular torsion.  Right testis appears normal.   04/28/2023 Imaging   CT CAP 1. Partially visualized thickening of the skin of the left scrotum. 2. Prominent bilateral axillary lymph nodes, right greater than left. Suspect at least 2 lymph nodes with abnormal cortical thickening in the right axilla. 3. Shotty retroperitoneal, bilateral iliac chain lymph nodes measuring up to 1.1 cm in short axis. 4. Bilateral inguinal lymph nodes with borderline thickened left inguinal lymph nodes measuring up to 1.1 cm in short axis. 5. 1.2 cm right adrenal mass, indeterminate. Attention on follow-up is recommended. Alternatively evaluation with MRI may be considered. 6. Hazy attenuation within the  anterior mediastinum without defined mass may represent residual thymus. Attention on follow-up is recommended. 7. Cholelithiasis. 8. L4-L5 bilateral pars articularis defect. 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 10. Further evaluation with axillary and inguinal ultrasound may be considered.   05/02/2023 Surgery   TESTICLE, LEFT, ORCHIECTOMY:  Seminoma, size 6.1 cm  Tumor limited to testis with rete testis and lymphovascular invasion  (pT2)  Germ cell neoplasia in situ is present  Spermatic cord and all margins of resection are negative for tumor     06/25/2023 Imaging   CT AP IMPRESSION: 1. Stable mild periportal, bilateral inguinal, bilateral external iliac  and bilateral common iliac lymphadenopathy. 2. No new or progressive metastatic disease in the abdomen or pelvis. 3. Solid peripheral basilar 2.0 cm right lower lobe pulmonary nodule, new from 04/28/2023 CT, similar to recent 06/22/2023 chest CT, favoring evolving pulmonary infarct related to recent diagnosis of acute pulmonary embolism. Additional patchy consolidation at  the posterior right lung base seen on prior chest CT has resolved. 4. Cholelithiasis. 5. Stable bilateral adrenal nodules characterized as benign adenomas on recent MRI, requiring no further imaging follow-up.   07/09/2023 -  Chemotherapy   Patient is on Treatment Plan : TESTICULAR BEP q21d x 3 Cycles         REVIEW OF SYSTEMS:   All relevant systems were reviewed with the patient and are negative.   VITALS:  Blood pressure (!) 132/93, pulse 100, temperature (!) 97.4 F (36.3 C), temperature source Temporal, resp. rate 17, weight 253 lb 14.4 oz (115.2 kg), SpO2 99%.  Wt Readings from Last 3 Encounters:  07/17/23 253 lb 14.4 oz (115.2 kg)  07/13/23 264 lb 8 oz (120 kg)  07/09/23 255 lb 12 oz (116 kg)    Body mass index is 35.41 kg/m.  Performance status (ECOG): 0 - Asymptomatic  PHYSICAL EXAM:   GENERAL:alert, no  distress and comfortable SKIN: skin color normal, no rashes  EYES: normal, sclera clear OROPHARYNX: no exudate, no erythema    LUNGS: clear to auscultation with normal breathing effort.  No wheeze or rales HEART: regular rate & rhythm and no murmurs and no lower extremity edema ABDOMEN: abdomen soft, non-tender and nondistended Musculoskeletal: no edema NEURO: alert  LABORATORY DATA:  I have reviewed the data as listed    Component Value Date/Time   NA 135 07/17/2023 0803   K 4.2 07/17/2023 0803   CL 103 07/17/2023 0803   CO2 27 07/17/2023 0803   GLUCOSE 105 (H) 07/17/2023 0803   BUN 17 07/17/2023 0803   CREATININE 0.99 07/17/2023 0803   CALCIUM 8.8 (L) 07/17/2023 0803   PROT 6.5 07/17/2023 0803   ALBUMIN 3.8 07/17/2023 0803   AST 12 (L) 07/17/2023 0803   ALT 30 07/17/2023 0803   ALKPHOS 50 07/17/2023 0803   BILITOT 0.5 07/17/2023 0803   GFRNONAA >60 07/17/2023 0803   GFRAA >90 06/16/2012 0048    No results found for: "SPEP", "UPEP"  Lab Results  Component Value Date   WBC 3.9 (L) 07/17/2023   NEUTROABS 2.4 07/17/2023   HGB 14.0 07/17/2023   HCT 40.9 07/17/2023   MCV 83.3 07/17/2023   PLT 69 (L) 07/17/2023      Chemistry      Component Value Date/Time   NA 135 07/17/2023 0803   K 4.2 07/17/2023 0803   CL 103 07/17/2023 0803   CO2 27 07/17/2023 0803   BUN 17 07/17/2023 0803   CREATININE 0.99 07/17/2023 0803      Component Value Date/Time   CALCIUM 8.8 (L) 07/17/2023 0803   ALKPHOS 50 07/17/2023 0803   AST 12 (L) 07/17/2023 0803   ALT 30 07/17/2023 0803   BILITOT 0.5 07/17/2023 0803       RADIOGRAPHIC STUDIES: I have personally reviewed the radiological images as listed and agreed with the findings in the report. CT ABDOMEN PELVIS W CONTRAST  Result Date: 06/25/2023 CLINICAL DATA:  Inpatient. Testicular seminoma. HIV. Restaging. * Tracking Code: BO * EXAM: CT ABDOMEN AND PELVIS WITH CONTRAST TECHNIQUE: Multidetector CT imaging of the abdomen and  pelvis was performed using the standard protocol following bolus administration of intravenous contrast. RADIATION DOSE REDUCTION: This exam was performed according to the departmental dose-optimization program which includes automated exposure control, adjustment of the mA and/or kV according to patient size and/or use of iterative reconstruction technique. CONTRAST:  OMNIPAQUE IOHEXOL 300 MG/ML  SOLN COMPARISON:  06/22/2023 MRI abdomen. 04/28/2023 CT  chest, abdomen and pelvis. FINDINGS: Lower chest: Solid peripheral basilar 2.0 cm right lower lobe pulmonary nodule (series 4/image 26), new from 04/28/2023 CT, similar to 06/22/2023 chest CT, favoring evolving pulmonary infarct related 2 recent diagnosis of acute pulmonary embolism. Additional patchy consolidation at the posterior right lung base seen on prior chest CT has resolved. Superior approach central venous catheter tip seen at the cavoatrial junction. Hepatobiliary: Normal liver size. No liver mass. Cholelithiasis. No biliary ductal dilatation. Pancreas: Normal, with no mass or duct dilation. Spleen: Normal size. No mass. Adrenals/Urinary Tract: Stable 1.7 cm right adrenal and 1.6 cm left adrenal nodules characterized as benign adenomas on recent MRI. Normal kidneys with no hydronephrosis and no renal mass. Normal bladder. Stomach/Bowel: Normal non-distended stomach. Normal caliber small bowel with no small bowel wall thickening. Normal appendix. Normal large bowel with no diverticulosis, large bowel wall thickening or pericolonic fat stranding. Vascular/Lymphatic: Normal caliber abdominal aorta. Patent portal, splenic, hepatic and renal veins. Stable mildly enlarged 1.2 cm periportal node (series 2/image 28). Borderline mildly enlarged bilateral inguinal lymph nodes up to 1.4 cm on the left (series 2/image 96), not substantially changed. Stable bilateral external iliac lymphadenopathy up to 1.3 cm on the right (series 2/image 88). Stable mild  bilateral common iliac adenopathy up to 1.2 cm on the left (series 2/image 66). Reproductive: Normal size prostate. Other: No pneumoperitoneum, ascites or focal fluid collection. Musculoskeletal: No aggressive appearing focal osseous lesions. Minimal lower thoracic spondylosis. IMPRESSION: 1. Stable mild periportal, bilateral inguinal, bilateral external iliac and bilateral common iliac lymphadenopathy. 2. No new or progressive metastatic disease in the abdomen or pelvis. 3. Solid peripheral basilar 2.0 cm right lower lobe pulmonary nodule, new from 04/28/2023 CT, similar to recent 06/22/2023 chest CT, favoring evolving pulmonary infarct related to recent diagnosis of acute pulmonary embolism. Additional patchy consolidation at the posterior right lung base seen on prior chest CT has resolved. 4. Cholelithiasis. 5. Stable bilateral adrenal nodules characterized as benign adenomas on recent MRI, requiring no further imaging follow-up. Electronically Signed   By: Delbert Phenix M.D.   On: 06/25/2023 19:53   ECHOCARDIOGRAM COMPLETE  Result Date: 06/23/2023    ECHOCARDIOGRAM REPORT   Patient Name:   NURIEL DIMUZIO Date of Exam: 06/23/2023 Medical Rec #:  [id number]    Height:       71.0 in Accession #:    [id number]   Weight:       288.1 lb Date of Birth:  09/11/1990    BSA:          2.463 m Patient Age:    32 years     BP:           115/85 mmHg Patient Gender: M            HR:           90 bpm. Exam Location:  Inpatient Procedure: 2D Echo, Cardiac Doppler and Color Doppler Indications:    I26.02 Pulmonary embolus  History:        Patient has no prior history of Echocardiogram examinations.                 Signs/Symptoms:Shortness of Breath and Dyspnea. Cancer.  Sonographer:    Sheralyn Boatman RDCS Referring Phys: [id number] CAROLE N HALL  Sonographer Comments: Technically difficult study due to poor echo windows. Patient supine due to pain. IMPRESSIONS  1. Left ventricular ejection fraction, by estimation, is 60 to 65%. The left  ventricle has normal function. The left  ventricle has no regional wall motion abnormalities. There is mild concentric left ventricular hypertrophy. Left ventricular diastolic parameters were normal.  2. Right ventricular systolic function is normal. The right ventricular size is normal.  3. The mitral valve is normal in structure. Trivial mitral valve regurgitation. No evidence of mitral stenosis.  4. The aortic valve is tricuspid. Aortic valve regurgitation is not visualized. No aortic stenosis is present.  5. The inferior vena cava is normal in size with greater than 50% respiratory variability, suggesting right atrial pressure of 3 mmHg. Comparison(s): No prior Echocardiogram. FINDINGS  Left Ventricle: Left ventricular ejection fraction, by estimation, is 60 to 65%. The left ventricle has normal function. The left ventricle has no regional wall motion abnormalities. The left ventricular internal cavity size was normal in size. There is  mild concentric left ventricular hypertrophy. Left ventricular diastolic parameters were normal. Right Ventricle: The right ventricular size is normal. Right ventricular systolic function is normal. Left Atrium: Left atrial size was normal in size. Right Atrium: Right atrial size was normal in size. Pericardium: There is no evidence of pericardial effusion. Mitral Valve: The mitral valve is normal in structure. Trivial mitral valve regurgitation. No evidence of mitral valve stenosis. Tricuspid Valve: The tricuspid valve is normal in structure. Tricuspid valve regurgitation is not demonstrated. No evidence of tricuspid stenosis. Aortic Valve: The aortic valve is tricuspid. Aortic valve regurgitation is not visualized. No aortic stenosis is present. Pulmonic Valve: The pulmonic valve was grossly normal. Pulmonic valve regurgitation is not visualized. No evidence of pulmonic stenosis. Aorta: The aortic root is normal in size and structure. Venous: The inferior vena cava is normal in  size with greater than 50% respiratory variability, suggesting right atrial pressure of 3 mmHg. IAS/Shunts: The interatrial septum was not well visualized.  LEFT VENTRICLE PLAX 2D LVIDd:         4.50 cm     Diastology LVIDs:         3.20 cm     LV e' medial:    9.14 cm/s LV PW:         1.30 cm     LV E/e' medial:  8.6 LV IVS:        1.10 cm     LV e' lateral:   11.30 cm/s LVOT diam:     2.70 cm     LV E/e' lateral: 7.0 LV SV:         111 LV SV Index:   45 LVOT Area:     5.73 cm  LV Volumes (MOD) LV vol d, MOD A2C: 48.0 ml LV vol d, MOD A4C: 59.4 ml LV vol s, MOD A2C: 16.6 ml LV vol s, MOD A4C: 25.4 ml LV SV MOD A2C:     31.4 ml LV SV MOD A4C:     59.4 ml LV SV MOD BP:      32.8 ml RIGHT VENTRICLE             IVC RV S prime:     13.10 cm/s  IVC diam: 1.90 cm TAPSE (M-mode): 1.7 cm LEFT ATRIUM             Index        RIGHT ATRIUM           Index LA diam:        3.60 cm 1.46 cm/m   RA Area:     12.30 cm LA Vol (A2C):   23.5 ml 9.54 ml/m  RA Volume:   26.20 ml  10.64 ml/m LA Vol (A4C):   34.9 ml 14.17 ml/m LA Biplane Vol: 29.0 ml 11.78 ml/m  AORTIC VALVE LVOT Vmax:   112.00 cm/s LVOT Vmean:  65.600 cm/s LVOT VTI:    0.194 m  AORTA Ao Root diam: 3.40 cm Ao Asc diam:  3.30 cm MITRAL VALVE MV Area (PHT): 3.66 cm    SHUNTS MV Decel Time: 208 msec    Systemic VTI:  0.19 m MV E velocity: 79.00 cm/s  Systemic Diam: 2.70 cm MV A velocity: 62.70 cm/s MV E/A ratio:  1.26 Olga Millers MD Electronically signed by Olga Millers MD Signature Date/Time: 06/23/2023/3:34:29 PM    Final    CT Angio Chest PE W and/or Wo Contrast  Result Date: 06/22/2023 CLINICAL DATA:  Recent diagnosis left testicular cancer with orchiectomy 05/02/2023, imaging guided right chest port insertion 06/18/2023, presents with dyspnea and chest wall pain with suspected DVT. EXAM: CT ANGIOGRAPHY CHEST WITH CONTRAST TECHNIQUE: Multidetector CT imaging of the chest was performed using the standard protocol during bolus administration of intravenous  contrast. Multiplanar CT image reconstructions and MIPs were obtained to evaluate the vascular anatomy. RADIATION DOSE REDUCTION: This exam was performed according to the departmental dose-optimization program which includes automated exposure control, adjustment of the mA and/or kV according to patient size and/or use of iterative reconstruction technique. CONTRAST:  75mL OMNIPAQUE IOHEXOL 350 MG/ML SOLN COMPARISON:  Chest, abdomen pelvis CT with IV contrast 04/28/2023, MRI abdomen earlier today. FINDINGS: Cardiovascular: Interval new insertion of a right chest port with IJ approach catheter terminating at about the superior cavoatrial junction. The cardiac size is upper limits normal. There is no pericardial effusion. No visible coronary calcifications. The aorta, great vessels, and pulmonary veins are normal. The pulmonary arteries are within normal caliber limits. There is hypodense acute thrombus, which grossly appears nonoccluding, in the right lower lobe main artery with extension into at least 2 posterior basal segmental arteries and with scattered thrombus in some of the downstream subsegmental posterior basilar arteries, some of which does appear occluding within the smaller arteries. In the right upper lobe, additional nonocclusive thrombus is seen in the anterior segmental artery with extension into 1 downstream subsegmental branch. No other arterial embolic disease is seen. Overall this is a small clot burden without findings of right heart strain. Mediastinum/Nodes: Mildly enlarged bilateral hilar lymph nodes are again noted, largest on the right is 1.5 cm short axis on 4:57, unchanged. Largest on the left 1.3 cm short axis on 4:67, stable. There are shotty prevascular space nodes which were also noted previously. Largest of these is 8 mm in short axis on 4:53, with a subcarinal nodal complex measuring 1.5 cm short axis, slightly larger than previously. There is no other mediastinal adenopathy. No  thyroid mass. Bilateral axillary adenopathy is redemonstrated. Again the largest right axillary node is 2.3 x 2.7 cm on 4:45, and the largest on the left 3.2 x 1.8 cm on 4:55, all unchanged. The thoracic trachea, main bronchi and thoracic esophagus are unremarkable. Lungs/Pleura: There is patchy consolidation in the posterior basal right lower lobe which is probably related to 1 or more evolving infarctions, interspersed with ground-glass disease. Underlying pneumonia is possible in the proper clinical setting. There is increased atelectasis in the posterior left lower lobe superior segment and in the medial segment of the right middle lobe. The lungs are otherwise clear. There is no pleural effusion, thickening or pneumothorax. Upper Abdomen: Fatty liver with mild  hepatosplenomegaly, splenic coronal diameter 16 cm, previously also 16 cm, with again noted 2.1 cm stone in the distal gallbladder. No acute upper abdominal abnormality. Stable bilateral adrenal adenomas. Mild elevation right hemidiaphragm. Musculoskeletal: No chest wall abnormality. No acute or significant osseous findings. Review of the MIP images confirms the above findings. IMPRESSION: 1. Acute pulmonary emboli in the right lower lobe main artery with extension into at least 2 posterior basal segmental arteries, scattered thrombus in some of the downstream subsegmental posterior basilar arteries, some of which does appear occluding within the smaller arteries. 2. Additional nonocclusive thrombus in the right upper lobe anterior segmental artery with extension into 1 downstream subsegmental branch. 3. Overall this is a small clot burden without findings of acute right heart strain. 4. Patchy consolidation in the posterior basal right lower lobe which is probably related to 1 or more evolving infarctions, interspersed with ground-glass disease. Underlying pneumonia is possible in the proper clinical setting. 5. Stable bilateral axillary adenopathy.  Stable mild bihilar adenopathy. 6. Stable fatty liver with mild hepatosplenomegaly. Cholelithiasis. Stable bilateral adrenal adenomas. 7. Critical Value/emergent results were called by telephone at the time of interpretation on 06/22/2023 at 10:06 pm to provider ABIGAIL HARRIS , who verbally acknowledged these results. Electronically Signed   By: Almira Bar M.D.   On: 06/22/2023 22:18   MR Abdomen W Wo Contrast  Result Date: 06/22/2023 CLINICAL DATA:  Characterize right adrenal mass, new diagnosis testicular cancer EXAM: MRI ABDOMEN WITHOUT AND WITH CONTRAST TECHNIQUE: Multiplanar multisequence MR imaging of the abdomen was performed both before and after the administration of intravenous contrast. CONTRAST:  10mL GADAVIST GADOBUTROL 1 MMOL/ML IV SOLN COMPARISON:  CT chest abdomen pelvis, 04/28/2023 FINDINGS: Lower chest: No acute abnormality. Hepatobiliary: No solid liver abnormality is seen. Hepatomegaly, maximum coronal span 20.0 cm. Gallstone. No gallbladder wall thickening, or biliary dilatation. Pancreas: Unremarkable. No pancreatic ductal dilatation or surrounding inflammatory changes. Spleen: Splenomegaly, maximum span 16.0 cm. Adrenals/Urinary Tract: Definitively benign, macroscopic fat containing bilateral adrenal adenomata (series 6, image 38). Kidneys are normal, without renal calculi, solid lesion, or hydronephrosis. Stomach/Bowel: Stomach is within normal limits. No evidence of bowel wall thickening, distention, or inflammatory changes. Vascular/Lymphatic: No significant vascular findings are present. No enlarged abdominal lymph nodes. Other: No abdominal wall hernia or abnormality. No ascites. Musculoskeletal: No acute or significant osseous findings. IMPRESSION: 1. Definitively benign, macroscopic fat containing bilateral adrenal adenomata, for which no specific further follow-up or characterization is required. 2. No evidence of lymphadenopathy or metastatic disease in the abdomen. 3.  Cholelithiasis. 4. Hepatosplenomegaly. Electronically Signed   By: Jearld Lesch M.D.   On: 06/22/2023 17:21   VAS [place] LOWER EXTREMITY VENOUS (DVT)  Result Date: 06/21/2023  Lower Venous DVT Study Patient Name:  ABIODUN BECKIUS  Date of Exam:   06/21/2023 Medical Rec #: [id number]     Accession #:    [id number] Date of Birth: 17-Sep-1990     Patient Gender: M Patient Age:   62 years Exam Location:  [facility] Procedure:      VAS [place] LOWER EXTREMITY VENOUS (DVT) Referring Phys: Breshay Ilg --------------------------------------------------------------------------------  Indications: Pain.  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 Limitations: Patient tension. Comparison Study: No prior study Performing Technologist: Shona Simpson  Examination Guidelines: A complete evaluation includes B-mode imaging, spectral Doppler, color Doppler, and power Doppler as needed of all accessible portions of each vessel. Bilateral testing is considered an integral part of a complete examination. Limited examinations for reoccurring indications may be performed as  noted. The reflux portion of the exam is performed with the patient in reverse Trendelenburg.  +-----+---------------+---------+-----------+----------+---------------+ RIGHTCompressibilityPhasicitySpontaneityPropertiesThrombus Aging  +-----+---------------+---------+-----------+----------+---------------+ CFV                 Yes      Yes                  Patent by color +-----+---------------+---------+-----------+----------+---------------+   +---------+---------------+---------+-----------+----------+---------------+ LEFT     CompressibilityPhasicitySpontaneityPropertiesThrombus Aging  +---------+---------------+---------+-----------+----------+---------------+ CFV      Full           Yes      Yes                                  +---------+---------------+---------+-----------+----------+---------------+ SFJ      Full                                                          +---------+---------------+---------+-----------+----------+---------------+ FV Prox  Full                                                         +---------+---------------+---------+-----------+----------+---------------+ FV Mid   Full                                                         +---------+---------------+---------+-----------+----------+---------------+ FV DistalFull                                                         +---------+---------------+---------+-----------+----------+---------------+ PFV                                                   Patent by color +---------+---------------+---------+-----------+----------+---------------+ POP      None           No       No                                   +---------+---------------+---------+-----------+----------+---------------+ PTV      None                                                         +---------+---------------+---------+-----------+----------+---------------+ PERO     None                                                         +---------+---------------+---------+-----------+----------+---------------+  Soleal   Full                                                         +---------+---------------+---------+-----------+----------+---------------+ Gastroc  Full                                                         +---------+---------------+---------+-----------+----------+---------------+     Summary: RIGHT: - No evidence of common femoral vein obstruction.   LEFT: - Findings consistent with acute deep vein thrombosis involving the left popliteal vein, left posterior tibial veins, and left peroneal veins.  - No cystic structure found in the popliteal fossa.  *See table(s) above for measurements and observations. Electronically signed by Heath Lark on 06/21/2023 at 4:18:20 PM.    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 EXAM: IMPLANTED PORT A CATH PLACEMENT WITH ULTRASOUND AND FLUOROSCOPIC GUIDANCE ANESTHESIA/SEDATION: Moderate (conscious) sedation was employed during this procedure. A total of Versed 4.0 mg and Fentanyl 100 mcg was administered intravenously. Moderate Sedation Time: 35 minutes. The patient's level of consciousness and vital signs were monitored continuously by radiology nursing throughout the procedure under my direct supervision. FLUOROSCOPY: 43 seconds.  6.0 mGy. PROCEDURE: The procedure, risks, benefits, and alternatives were explained to the patient. Questions regarding the procedure were encouraged and answered. The patient understands and consents to the procedure. A time-out was performed prior to initiating the procedure. Ultrasound was utilized to confirm patency of the right internal jugular vein. An ultrasound image was saved and recorded. The right neck and chest were prepped with chlorhexidine in a sterile fashion, and a sterile drape was applied covering the operative field. Maximum barrier sterile technique with sterile gowns and gloves were used for the procedure. Local anesthesia was provided with 1% lidocaine. After creating a small venotomy incision, a 21 gauge needle was advanced into the right internal jugular vein under direct, real-time ultrasound guidance. Ultrasound image documentation was performed. After securing guidewire access, an 8 Fr dilator was placed. A J-wire was kinked to measure appropriate catheter length. A subcutaneous port pocket was then created along the upper chest wall utilizing sharp and blunt dissection. Portable cautery was utilized. The pocket was irrigated with sterile saline. A single lumen power injectable port was chosen for placement. The 8 Fr catheter was tunneled from the port pocket site to the venotomy incision. The port was placed in  the pocket. External catheter was trimmed to appropriate length based on guidewire measurement. At the venotomy, an 8 Fr peel-away sheath was placed over a guidewire. The catheter was then placed through the sheath and the sheath removed. Final catheter positioning was confirmed and documented with a fluoroscopic spot image. The port was accessed with a needle and aspirated and flushed with heparinized saline. The access needle was removed. The venotomy and port pocket incisions were closed with subcutaneous 3-0 Monocryl and subcuticular 4-0 Vicryl. Dermabond was applied to both incisions. COMPLICATIONS: COMPLICATIONS None FINDINGS: After catheter placement, the tip lies at the cavo-atrial junction. The catheter aspirates  normally and is ready for immediate use. IMPRESSION: Placement of single lumen port a cath via right internal jugular vein. The catheter tip lies at the cavo-atrial junction. A power injectable port a cath was placed and is ready for immediate use. Electronically Signed   By: Irish Lack M.D.   On: 06/18/2023 16:19

## 2023-07-17 NOTE — Assessment & Plan Note (Signed)
Lidocaine solution 2% sent to [facility] pharmacy to mix 1:1 with Maalox for mouth pain as needed.

## 2023-07-17 NOTE — Patient Instructions (Signed)
[facility] - A DEPT OF [facility]  Discharge Instructions: Thank you for choosing [facility] to provide your oncology and hematology care.   If you have a lab appointment with the Cancer Center, please go directly to the Cancer Center and check in at the registration area.   Wear comfortable clothing and clothing appropriate for easy access to any Portacath or PICC line.   We strive to give you quality time with your provider. You may need to reschedule your appointment if you arrive late (15 or more minutes).  Arriving late affects you and other patients whose appointments are after yours.  Also, if you miss three or more appointments without notifying the office, you may be dismissed from the clinic at the provider's discretion.      For prescription refill requests, have your pharmacy contact our office and allow 72 hours for refills to be completed.    Today you received the following chemotherapy and/or immunotherapy agents: Bleocin      To help prevent nausea and vomiting after your treatment, we encourage you to take your nausea medication as directed.  BELOW ARE SYMPTOMS THAT SHOULD BE REPORTED IMMEDIATELY: *FEVER GREATER THAN 100.4 F (38 C) OR HIGHER *CHILLS OR SWEATING *NAUSEA AND VOMITING THAT IS NOT CONTROLLED WITH YOUR NAUSEA MEDICATION *UNUSUAL SHORTNESS OF BREATH *UNUSUAL BRUISING OR BLEEDING *URINARY PROBLEMS (pain or burning when urinating, or frequent urination) *BOWEL PROBLEMS (unusual diarrhea, constipation, pain near the anus) TENDERNESS IN MOUTH AND THROAT WITH OR WITHOUT PRESENCE OF ULCERS (sore throat, sores in mouth, or a toothache) UNUSUAL RASH, SWELLING OR PAIN  UNUSUAL VAGINAL DISCHARGE OR ITCHING   Items with * indicate a potential emergency and should be followed up as soon as possible or go to the Emergency Department if any problems should occur.  Please show the CHEMOTHERAPY ALERT CARD or IMMUNOTHERAPY  ALERT CARD at check-in to the Emergency Department and triage nurse.  Should you have questions after your visit or need to cancel or reschedule your appointment, please contact [facility] - A DEPT OF [facility]  Dept: [phone]  and follow the prompts.  Office hours are 8:00 a.m. to 4:30 p.m. Monday - Friday. Please note that voicemails left after 4:00 p.m. may not be returned until the following business day.  We are closed weekends and major holidays. You have access to a nurse at all times for urgent questions. Please call the main number to the clinic Dept: [phone] and follow the prompts.   For any non-urgent questions, you may also contact your provider using MyChart. We now offer e-Visits for anyone 92 and older to request care online for non-urgent symptoms. For details visit [email or web address].   Also download the MyChart app! Go to the app store, search "MyChart", open the app, select [place], and log in with your MyChart username and password.

## 2023-07-17 NOTE — Progress Notes (Signed)
Patient states he took Compazine this morning around 0730.

## 2023-07-20 ENCOUNTER — Emergency Department (HOSPITAL_COMMUNITY)
Admission: EM | Admit: 2023-07-20 | Discharge: 2023-07-20 | Disposition: A | Discharge status: Home / Self Care | Payer: 59 | Attending: Emergency Medicine | Admitting: Emergency Medicine

## 2023-07-20 ENCOUNTER — Emergency Department (HOSPITAL_COMMUNITY): Payer: 59

## 2023-07-20 ENCOUNTER — Encounter (HOSPITAL_COMMUNITY): Payer: Self-pay

## 2023-07-20 ENCOUNTER — Other Ambulatory Visit: Payer: Self-pay

## 2023-07-20 DIAGNOSIS — Z8547 Personal history of malignant neoplasm of testis: Secondary | ICD-10-CM | POA: Insufficient documentation

## 2023-07-20 DIAGNOSIS — Z21 Asymptomatic human immunodeficiency virus [HIV] infection status: Secondary | ICD-10-CM | POA: Diagnosis not present

## 2023-07-20 DIAGNOSIS — D701 Agranulocytosis secondary to cancer chemotherapy: Secondary | ICD-10-CM | POA: Insufficient documentation

## 2023-07-20 DIAGNOSIS — Z7901 Long term (current) use of anticoagulants: Secondary | ICD-10-CM | POA: Insufficient documentation

## 2023-07-20 DIAGNOSIS — R0789 Other chest pain: Secondary | ICD-10-CM | POA: Diagnosis present

## 2023-07-20 DIAGNOSIS — R0602 Shortness of breath: Secondary | ICD-10-CM | POA: Diagnosis not present

## 2023-07-20 DIAGNOSIS — D696 Thrombocytopenia, unspecified: Secondary | ICD-10-CM | POA: Insufficient documentation

## 2023-07-20 LAB — CBC
HCT: 37.7 % — ABNORMAL LOW (ref 39.0–52.0)
Hemoglobin: 12.6 g/dL — ABNORMAL LOW (ref 13.0–17.0)
MCH: 28.2 pg (ref 26.0–34.0)
MCHC: 33.4 g/dL (ref 30.0–36.0)
MCV: 84.3 fL (ref 80.0–100.0)
Platelets: 22 10*3/uL — CL (ref 150–400)
RBC: 4.47 MIL/uL (ref 4.22–5.81)
RDW: 13.7 % (ref 11.5–15.5)
WBC: 1.4 10*3/uL — CL (ref 4.0–10.5)
nRBC: 0 % (ref 0.0–0.2)

## 2023-07-20 LAB — BASIC METABOLIC PANEL
Anion gap: 8 (ref 5–15)
BUN: 19 mg/dL (ref 6–20)
CO2: 23 mmol/L (ref 22–32)
Calcium: 9.1 mg/dL (ref 8.9–10.3)
Chloride: 103 mmol/L (ref 98–111)
Creatinine, Ser: 0.91 mg/dL (ref 0.61–1.24)
GFR, Estimated: >60 mL/min (ref >60)
Glucose, Bld: 97 mg/dL (ref 70–99)
Potassium: 4.1 mmol/L (ref 3.5–5.1)
Sodium: 134 mmol/L — ABNORMAL LOW (ref 135–145)

## 2023-07-20 LAB — DIFFERENTIAL
Abs Immature Granulocytes: 0.03 10*3/uL (ref 0.00–0.07)
Basophils Absolute: 0 10*3/uL (ref 0.0–0.1)
Basophils Relative: 1 %
Eosinophils Absolute: 0 10*3/uL (ref 0.0–0.5)
Eosinophils Relative: 1 %
Immature Granulocytes: 2 %
Lymphocytes Relative: 65 %
Lymphs Abs: 1 10*3/uL (ref 0.7–4.0)
Monocytes Absolute: 0.1 10*3/uL (ref 0.1–1.0)
Monocytes Relative: 6 %
Neutro Abs: 0.4 10*3/uL — CL (ref 1.7–7.7)
Neutrophils Relative %: 25 %

## 2023-07-20 LAB — TROPONIN I (HIGH SENSITIVITY)
Troponin I (High Sensitivity): 3 ng/L (ref <18)
Troponin I (High Sensitivity): 2 ng/L (ref <18)

## 2023-07-20 MED ORDER — IOHEXOL 350 MG/ML SOLN
75.0000 mL | Freq: Once | INTRAVENOUS | Status: AC | PRN
  Administered 2023-07-20: 75 mL via INTRAVENOUS

## 2023-07-20 MED ORDER — MORPHINE SULFATE (PF) 4 MG/ML IV SOLN
4.0000 mg | Freq: Once | INTRAVENOUS | Status: AC
  Administered 2023-07-20: 4 mg via INTRAVENOUS
  Filled 2023-07-20: qty 1

## 2023-07-20 MED ORDER — ALUM & MAG HYDROXIDE-SIMETH 200-200-20 MG/5ML PO SUSP
30.0000 mL | Freq: Once | ORAL | Status: AC
  Administered 2023-07-20: 30 mL via ORAL
  Filled 2023-07-20: qty 30

## 2023-07-20 MED ORDER — SODIUM CHLORIDE (PF) 0.9 % IJ SOLN
INTRAMUSCULAR | Status: AC
  Filled 2023-07-20: qty 50

## 2023-07-20 NOTE — ED Notes (Signed)
Called lab to add differential.

## 2023-07-20 NOTE — ED Provider Notes (Signed)
[place] [facility] AT [facility] Provider Note   CSN: [id number] Arrival date & time: 07/20/23  0630     History  Chief Complaint  Patient presents with   Chest Pain   Shortness of Breath    Steve Stewart is a 32 y.o. male.  Patient to ED with mom reporting onset bilateral chest pain through to the back that started around midnight. No fever. No sick contacts. History of testicular cancer, on chemo, HIV, recent diagnosis of PE 06/22/23 who was taken off Eliquis 3 days ago, he states, to have blood tests done this coming week. He feels his symptoms are similar to when he was diagnosed with PE. No vomiting.  The history is provided by the patient. No language interpreter was used.  Chest Pain Associated symptoms: shortness of breath   Shortness of Breath Associated symptoms: chest pain        Home Medications Prior to Admission medications   Medication Sig Start Date End Date Taking? Authorizing Provider  acetaminophen (TYLENOL) 650 MG CR tablet Take 1,300 mg by mouth every 8 (eight) hours as needed for pain.    [provider]  albuterol (VENTOLIN HFA) 108 (90 Base) MCG/ACT inhaler Inhale 1 puff into the lungs every 6 (six) hours as needed for wheezing or shortness of breath. 10/02/22   [provider]  APIXABAN Everlene Balls) VTE STARTER PACK (10MG  AND 5MG ) Take as directed on package: start with two-5mg  tablets twice daily for 7 days. On day 8, switch to one-5mg  tablet twice daily. 06/21/23   [name], MD  bictegravir-emtricitabine-tenofovir AF (BIKTARVY) 50-200-25 MG TABS tablet Take 1 tablet by mouth daily. 06/28/23   Veryl Speak, FNP  bictegravir-emtricitabine-tenofovir AF (BIKTARVY) 50-200-25 MG TABS tablet Take 1 tablet by mouth daily. 07/09/23   Kuppelweiser, Cassie L, RPH-CPP  dexamethasone (DECADRON) 4 MG tablet Take 2 tablets (8 mg total) by mouth daily. Start the day after last cisplatin dose on day 6 of chemo x 3  days.Take with food. Patient not taking: Reported on 06/21/2023 06/24/23   [name], MD  famotidine (PEPCID) 20 MG tablet Take 1 tablet (20 mg total) by mouth 2 (two) times daily. 07/12/23   [name], MD  lidocaine (XYLOCAINE) 2 % solution Use as directed 15 mLs in the mouth or throat every 3 (three) hours as needed for mouth pain. Mix 1 to 1 ratio with Maalox to rinse for pain 07/17/23   [name], MD  lidocaine-prilocaine (EMLA) cream Apply to affected area once Patient not taking: Reported on 06/21/2023 06/24/23   [name], MD  ondansetron (ZOFRAN) 8 MG tablet Take 1 tablet (8 mg total) by mouth every 8 (eight) hours as needed for nausea or vomiting. Start on the third day after last cisplatin dose on day 8 of chemo Patient not taking: Reported on 06/21/2023 06/24/23   [name], MD  prochlorperazine (COMPAZINE) 10 MG tablet Take 1 tablet (10 mg total) by mouth every 6 (six) hours as needed for nausea or vomiting. Patient not taking: Reported on 06/21/2023 06/24/23   [name], MD  traZODone (DESYREL) 50 MG tablet Take 1 tablet (50 mg total) by mouth at bedtime. 07/17/23   [name], MD      Allergies    Patient has no known allergies.    Review of Systems   Review of Systems  Respiratory:  Positive for shortness of breath.   Cardiovascular:  Positive for chest pain.    Physical Exam Updated Vital Signs BP 131/78   Pulse (!) 59   Temp 97.8 F (36.6 C) (Oral)   Resp 14   Ht 5\' 11"  (1.803 m)   Wt 114.3 kg   SpO2 100%   BMI 35.15 kg/m  Physical Exam Vitals and nursing note reviewed.  Constitutional:      Appearance: He is well-developed. He is not ill-appearing.  Cardiovascular:     Rate and Rhythm: Normal rate and regular rhythm.     Heart sounds: No murmur heard. Pulmonary:     Effort: Pulmonary effort is normal.     Breath sounds: Normal breath sounds. No decreased breath sounds, wheezing, rhonchi or rales.  Musculoskeletal:         General: Normal range of motion.     Cervical back: Normal range of motion and neck supple.  Skin:    General: Skin is warm and dry.  Neurological:     Mental Status: He is alert and oriented to person, place, and time.     ED Results / Procedures / Treatments   Labs (all labs ordered are listed, but only abnormal results are displayed) Labs Reviewed  BASIC METABOLIC PANEL - Abnormal; Notable for the following components:      Result Value   Sodium 134 (*)    All other components within normal limits  CBC - Abnormal; Notable for the following components:   WBC 1.4 (*)    Hemoglobin 12.6 (*)    HCT 37.7 (*)    Platelets 22 (*)    All other components within normal limits  DIFFERENTIAL - Abnormal; Notable for the following components:   Neutro Abs 0.4 (*)    All other components within normal limits  TROPONIN I (HIGH SENSITIVITY)  TROPONIN I (HIGH SENSITIVITY)   Results for orders placed or performed during the hospital encounter of 07/20/23  Basic metabolic panel  Result Value Ref Range   Sodium 134 (L) 135 - 145 mmol/L   Potassium 4.1 3.5 - 5.1 mmol/L   Chloride 103 98 - 111 mmol/L   CO2 23 22 - 32 mmol/L   Glucose, Bld 97 70 - 99 mg/dL   BUN 19 6 - 20 mg/dL   Creatinine, Ser 5.63 0.61 - 1.24 mg/dL   Calcium 9.1 8.9 - 87.5 mg/dL   GFR, Estimated >64 >33 mL/min   Anion gap 8 5 - 15  CBC  Result Value Ref Range   WBC 1.4 (LL) 4.0 - 10.5 K/uL   RBC 4.47 4.22 - 5.81 MIL/uL   Hemoglobin 12.6 (L) 13.0 - 17.0 g/dL   HCT 29.5 (L) 18.8 - 41.6 %   MCV 84.3 80.0 - 100.0 fL   MCH 28.2 26.0 - 34.0 pg   MCHC 33.4 30.0 - 36.0 g/dL   RDW 60.6 30.1 - 60.1 %   Platelets 22 (LL) 150 - 400 K/uL   nRBC 0.0 0.0 - 0.2 %  Differential  Result Value Ref Range   Neutrophils Relative % 25 %   Neutro Abs 0.4 (LL) 1.7 - 7.7 K/uL   Lymphocytes Relative 65 %   Lymphs Abs 1.0 0.7 - 4.0 K/uL   Monocytes Relative 6 %   Monocytes Absolute 0.1 0.1 - 1.0 K/uL   Eosinophils Relative 1 %    Eosinophils Absolute 0.0 0.0 - 0.5 K/uL   Basophils Relative 1 %   Basophils Absolute 0.0 0.0 - 0.1 K/uL   Immature Granulocytes  2 %   Abs Immature Granulocytes 0.03 0.00 - 0.07 K/uL   Reactive, Benign Lymphocytes PRESENT   Troponin I (High Sensitivity)  Result Value Ref Range   Troponin I (High Sensitivity) 3 <18 ng/L  Troponin I (High Sensitivity)  Result Value Ref Range   Troponin I (High Sensitivity) 2 <18 ng/L    EKG EKG Interpretation Date/Time:  Friday July 20 2023 06:42:53 EST Ventricular Rate:  67 PR Interval:  125 QRS Duration:  101 QT Interval:  388 QTC Calculation: 410 R Axis:   58  Text Interpretation: Sinus rhythm Baseline wander in lead(s) I II aVR Confirmed by Nicanor Alcon, April ([id number]) on 07/20/2023 6:48:34 AM  Radiology CT Angio Chest PE W and/or Wo Contrast  Result Date: 07/20/2023 CLINICAL DATA:  Short of breath and chest pain beginning late last night. Patient stopped taking Eliquis on Tuesday. EXAM: CT ANGIOGRAPHY CHEST WITH CONTRAST TECHNIQUE: Multidetector CT imaging of the chest was performed using the standard protocol during bolus administration of intravenous contrast. Multiplanar CT image reconstructions and MIPs were obtained to evaluate the vascular anatomy. RADIATION DOSE REDUCTION: This exam was performed according to the departmental dose-optimization program which includes automated exposure control, adjustment of the mA and/or kV according to patient size and/or use of iterative reconstruction technique. CONTRAST:  75mL OMNIPAQUE IOHEXOL 350 MG/ML SOLN COMPARISON:  06/22/2023. FINDINGS: Cardiovascular: Pulmonary arteries are well opacified. No evidence of a pulmonary embolism. Previously noted right-sided pulmonary emboli have resolved. Heart is normal in size and configuration. No pericardial effusion. Great vessels are normal in caliber. No aortic dissection or atherosclerosis. Arch branch vessels are widely patent. Mediastinum/Nodes: No neck  base, mediastinal or hilar masses. No enlarged lymph nodes. Trachea and esophagus are unremarkable. Lungs/Pleura: Mild ground-glass opacities in the lower lobes suspected to be atelectasis. More confluent type opacity noted in the lower lobes on the prior study resolved. Remainder of the lungs is clear. No pleural effusion or pneumothorax. Upper Abdomen: No acute findings. 1.9 cm gallstone, dependent aspect of a distended gallbladder. No gallbladder wall thickening or adjacent inflammation. Musculoskeletal: No chest wall abnormality. No acute or significant osseous findings. Review of the MIP images confirms the above findings. IMPRESSION: 1. No evidence of a pulmonary embolism. Previously noted right-sided pulmonary emboli have resolved. 2. No acute findings. 3. Cholelithiasis. 4. Mild ground-glass opacities in the lower lobes suspected to be atelectasis. Electronically Signed   By: Amie Portland M.D.   On: 07/20/2023 09:15   DG Chest 2 View  Result Date: 07/20/2023 CLINICAL DATA:  Chest pain EXAM: CHEST - 2 VIEW COMPARISON:  06/22/2023 FINDINGS: Porta catheter on the right with tip at the upper cavoatrial junction. Normal heart size and mediastinal contours. No acute infiltrate or edema. No effusion or pneumothorax. No acute osseous findings. IMPRESSION: No active cardiopulmonary disease. Electronically Signed   By: Tiburcio Pea M.D.   On: 07/20/2023 06:59    Procedures Procedures    Medications Ordered in ED Medications  morphine (PF) 4 MG/ML injection 4 mg (4 mg Intravenous Given 07/20/23 0728)  iohexol (OMNIPAQUE) 350 MG/ML injection 75 mL (75 mLs Intravenous Contrast Given 07/20/23 0843)  alum & mag hydroxide-simeth (MAALOX/MYLANTA) 200-200-20 MG/5ML suspension 30 mL (30 mLs Oral Given 07/20/23 1005)    ED Course/ Medical Decision Making/ A&P Clinical Course as of 07/20/23 1023  Fri Jul 20, 2023  0824 Recheck - he is feeling more subjectively SOB, O2 sats 99%, no tachycardia. He is  hot/sweaty. WBC and platelets significantly low. CTA  pending. Will discuss with oncology.  [SU]  [id number] Feeling better with O2 via [place] and a fan. VSS without change. Still has some central chest pain and is requesting something for indigestion. EKG nonacute, negative troponin x 2. CXR normal. GI cocktail ordered - will re-evaluate.  [SU]  [id number] Discussed with oncology. Reason for stopping Eliquis is not immediately clear but feels it was in anticipation of a drop in platelets with chemo.   CTA today shows clots have resolved. Will not restart Eliquis (platelets now <50K and no clot burden on CTA). He can be discharged home - strict return precautions for fever >100.5 and/or any bleeding.  [SU]    Clinical Course User Index [SU] Elpidio Anis, PA-C                                 Medical Decision Making This patient presents to the ED for concern of chest pain, this involves an extensive number of treatment options, and is a complaint that carries with it a high risk of complications and morbidity.  The differential diagnosis includes ACS, PNA, dissection, PE   Co morbidities that complicate the patient evaluation  HIV, testicular CA on chemo, recent diagnosis of PE   Additional history obtained:  Additional history and/or information obtained from chart review, notable for  From Dr. Nelta Numbers note 07/17/23: Patient instructed to stop Eliquis, last dose that evening.    Lab Tests:  I Ordered, and personally interpreted labs.  The pertinent results include:  WBC 1.4, plts 22 (69). Other labs essentially normal.     Imaging Studies ordered:  I ordered imaging studies including CTA chest Radiologist interpretation: Pulm emobli previously seen have resolved. CXR without PNA, edema, PTX   Cardiac Monitoring:  The patient was maintained on a cardiac monitor.  I personally viewed and interpreted the cardiac monitored which showed an underlying rhythm of: NSR, baseline wander   Medicines  ordered and prescription drug management:  I ordered medication including Morphine  for pain Reevaluation of the patient after these medicines showed that the patient improved Also ordered GI cocktail to address residual pain.    Consultations Obtained:  I requested consultation with the Oncology, Dr. Arlana Pouch, and discussed lab and imaging findings as well as pertinent plan - they recommend: do not restart Eliquis as plts now <50K (PE resolved on CTA as well). Can discharge home. Strongly encouraged to return to hospital if any fever (>100.5) and/or bleeding.    Problem List / ED Course:  Patient with recent diagnoses of Testicular CA, HIV and DVT/PE - off Eliquis as instructed for 3 days with onset chest pain and SOB last night. No fever.    Reevaluation:  After the interventions noted above, I reevaluated the patient and found that they have :improved   Social Determinants of Health:  Good family support   Disposition:  After consideration of the diagnostic results and the patients response to treatment, I feel that the patient would benefit from discharge home as per discussion with oncology. Patient and mother updated and are comfortable with discharge home.    Amount and/or Complexity of Data Reviewed Labs: ordered. Radiology: ordered.  Risk OTC drugs. Prescription drug management.           Final Clinical Impression(s) / ED Diagnoses Final diagnoses:  Atypical chest pain  Thrombocytopenia (HCC)  Chemotherapy-induced neutropenia (HCC)    Rx / DC Orders ED Discharge Orders  None         Elpidio Anis, PA-C 07/20/23 1023    Margarita Grizzle, MD 07/20/23 [phone]

## 2023-07-20 NOTE — ED Triage Notes (Signed)
Pt reports with [facility] and chest pain that started around midnight. Pt states that the pain goes through to his back. Pt reports stopping Eliquis on Tuesday and feels like he may have a clot in his lungs. Last tx on Tuesday.

## 2023-07-20 NOTE — Discharge Instructions (Signed)
As discussed, you do not need to restart Eliquis. Keep your scheduled appointment next week for repeat labs studies.   Return to the emergency department with any fever (>100.5) and/or any bleeding.

## 2023-07-23 DIAGNOSIS — D61818 Other pancytopenia: Secondary | ICD-10-CM | POA: Insufficient documentation

## 2023-07-23 NOTE — Assessment & Plan Note (Signed)
PE resolved Holding apixaban due to severe thrombocytopenia Resume at 2.5 mg

## 2023-07-23 NOTE — Assessment & Plan Note (Signed)
  Continue cycle 1 day 16

## 2023-07-23 NOTE — Progress Notes (Unsigned)
Patient Care Team: Patient, [name] - General (General Practice)  Clinic Day:  07/24/2023  Referring physician: [name], MD  ASSESSMENT & PLAN:   Assessment & Plan: 32 y.o. male with diagnosis of left seminoma s/p orchiectomy.  Diagnosis: pT2 [id number] normal TM. 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  MRI showed benign adrenal nodule.  Primary seminoma of left testis (HCC)  Continue cycle 1 day 16  Acute pulmonary embolism (HCC) PE resolved Holding apixaban due to severe thrombocytopenia Follow up next week  Other pancytopenia (HCC) Labs showed improvement.  Nausea without vomiting Take Compazine twice daily on days of chemotherapy as prophylaxis  At risk for side effect of medication Monitor for neutropenic fever Discussed preventing constipation by adding Benefiber, MiraLAX as needed, stool softener as needed. Monitor for bleeding and clotting  Cholelithiasis No signs of cholecystitis. Discussed precautions, dietary restrictions.   Patient will need to monitor for signs of PE and DVT.   The patient will proceed with treatment today. He will return to see me on 12/9 with labs.  12/16 with labs, 12/30 with labs. The patient understands the plans discussed today and is in agreement with them.  He knows to contact our office if he develops concerns prior to his next appointment.  [name], MD  [facility] - A DEPT OF [facility] [address] [place] [address] Dept: [phone] Dept Fax: [phone]   No orders of the defined types were placed in this encounter.     CHIEF COMPLAINT:  CC: seminoma  Current Treatment:  BEP  INTERVAL HISTORY:  Malaki is here today for repeat clinical assessment. He went to ED for chest pain and found pancytopenia. Platelet was 22. CTA negative for PE. He denies fevers or chills. He denies pain. His appetite is  good. Maalox helped resolved. He denies heartburn or stomach pain, nausea, vomiting or diarrhea.  Some constipation.  No fever or heart racing. No chest pain, no bloody stool or dark stool or bloody urine.    I have reviewed the past medical history, past surgical history, social history and family history with the patient and they are unchanged from previous note.  ALLERGIES:  has No Known Allergies.  MEDICATIONS:  Current Outpatient Medications  Medication Sig Dispense Refill   acetaminophen (TYLENOL) 650 MG CR tablet Take 1,300 mg by mouth every 8 (eight) hours as needed for pain.     albuterol (VENTOLIN HFA) 108 (90 Base) MCG/ACT inhaler Inhale 1 puff into the lungs every 6 (six) hours as needed for wheezing or shortness of breath.     bictegravir-emtricitabine-tenofovir AF (BIKTARVY) 50-200-25 MG TABS tablet Take 1 tablet by mouth daily. 30 tablet 2   dexamethasone (DECADRON) 4 MG tablet Take 2 tablets (8 mg total) by mouth daily. Start the day after last cisplatin dose on day 6 of chemo x 3 days.Take with food. 30 tablet 1   famotidine (PEPCID) 20 MG tablet Take 1 tablet (20 mg total) by mouth 2 (two) times daily. 30 tablet 2   lidocaine (XYLOCAINE) 2 % solution Use as directed 15 mLs in the mouth or throat every 3 (three) hours as needed for mouth pain. Mix 1 to 1 ratio with Maalox to rinse for pain 100 mL 0   lidocaine-prilocaine (EMLA) cream Apply to affected area once 30 g 3   ondansetron (ZOFRAN) 8 MG  tablet Take 1 tablet (8 mg total) by mouth every 8 (eight) hours as needed for nausea or vomiting. Start on the third day after last cisplatin dose on day 8 of chemo 30 tablet 1   prochlorperazine (COMPAZINE) 10 MG tablet Take 1 tablet (10 mg total) by mouth every 6 (six) hours as needed for nausea or vomiting. 30 tablet 1   traZODone (DESYREL) 50 MG tablet Take 1 tablet (50 mg total) by mouth at bedtime. 90 tablet 1   apixaban (ELIQUIS) 5 MG TABS tablet Take 5 mg by mouth 2 (two) times  daily. ON HOLD as of 07/24/23     No current facility-administered medications for this visit.   Facility-Administered Medications Ordered in Other Visits  Medication Dose Route Frequency Provider Last Rate Last Admin   0.9 %  sodium chloride infusion   Intravenous Once [name], MD       bleomycin (BLEOCIN) 30 Units in sodium chloride 0.9 % 50 mL chemo infusion  30 Units Intravenous Once [name], MD       heparin lock flush 100 unit/mL  500 Units Intracatheter Once PRN [name], MD       prochlorperazine (COMPAZINE) tablet 10 mg  10 mg Oral Once [name], MD       sodium chloride flush (NS) 0.9 % injection 10 mL  10 mL Intracatheter PRN [name], MD        HISTORY OF PRESENT ILLNESS:   Oncology History  Primary seminoma of left testis (HCC)  04/28/2023 Initial Diagnosis   Primary seminoma of left testis [facility]) Presented to ED for worsening testicular pain and presented with 2 months history of testicular mass.   04/28/2023 Imaging   [place] 1. Positive for abnormally enlarged and heterogeneous but vascular Left Testis highly suspicious for Testicular Neoplasm. Recommend Urology consultation. 2. Negative for testicular torsion.  Right testis appears normal.   04/28/2023 Imaging   CT CAP 1. Partially visualized thickening of the skin of the left scrotum. 2. Prominent bilateral axillary lymph nodes, right greater than left. Suspect at least 2 lymph nodes with abnormal cortical thickening in the right axilla. 3. Shotty retroperitoneal, bilateral iliac chain lymph nodes measuring up to 1.1 cm in short axis. 4. Bilateral inguinal lymph nodes with borderline thickened left inguinal lymph nodes measuring up to 1.1 cm in short axis. 5. 1.2 cm right adrenal mass, indeterminate. Attention on follow-up is recommended. Alternatively evaluation with MRI may be considered. 6. Hazy attenuation within the anterior mediastinum without defined mass may represent residual  thymus. Attention on follow-up is recommended. 7. Cholelithiasis. 8. L4-L5 bilateral pars articularis defect. 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 10. Further evaluation with axillary and inguinal ultrasound may be considered.   05/02/2023 Surgery   TESTICLE, LEFT, ORCHIECTOMY:  Seminoma, size 6.1 cm  Tumor limited to testis with rete testis and lymphovascular invasion  (pT2)  Germ cell neoplasia in situ is present  Spermatic cord and all margins of resection are negative for tumor     06/25/2023 Imaging   CT AP IMPRESSION: 1. Stable mild periportal, bilateral inguinal, bilateral external iliac  and bilateral common iliac lymphadenopathy. 2. No new or progressive metastatic disease in the abdomen or pelvis. 3. Solid peripheral basilar 2.0 cm right lower lobe pulmonary nodule, new from 04/28/2023 CT, similar to recent 06/22/2023 chest CT, favoring evolving pulmonary infarct related to recent diagnosis of acute pulmonary embolism. Additional patchy  consolidation at the posterior right lung base seen on prior chest CT has resolved. 4. Cholelithiasis. 5. Stable bilateral adrenal nodules characterized as benign adenomas on recent MRI, requiring no further imaging follow-up.   07/09/2023 -  Chemotherapy   Patient is on Treatment Plan : TESTICULAR BEP q21d x 3 Cycles         REVIEW OF SYSTEMS:   All relevant systems were reviewed with the patient and are negative.   VITALS:  Blood pressure 124/81, pulse 86, temperature 97.7 F (36.5 C), temperature source Oral, resp. rate 16, height 5\' 11"  (1.803 m), weight 259 lb 3.2 oz (117.6 kg), SpO2 100%.  Wt Readings from Last 3 Encounters:  07/24/23 259 lb 3.2 oz (117.6 kg)  07/20/23 252 lb (114.3 kg)  07/17/23 253 lb 14.4 oz (115.2 kg)    Body mass index is 36.15 kg/m.  Performance status (ECOG): 0 - Asymptomatic  PHYSICAL EXAM:   GENERAL:alert, no distress and comfortable SKIN: skin color normal.  No  jaundice EYES: normal, sclera clear OROPHARYNX: no exudate, no erythema    LUNGS: clear to auscultation with normal breathing effort.  No wheeze or rales HEART: regular rate & rhythm and no murmurs and no lower extremity edema ABDOMEN: abdomen soft, non-tender and nondistended Musculoskeletal: no edema NEURO: alert, fluent speech  LABORATORY DATA:  I have reviewed the data as listed    Component Value Date/Time   NA 138 07/24/2023 0759   K 3.6 07/24/2023 0759   CL 105 07/24/2023 0759   CO2 27 07/24/2023 0759   GLUCOSE 111 (H) 07/24/2023 0759   BUN 13 07/24/2023 0759   CREATININE 0.89 07/24/2023 0759   CALCIUM 8.5 (L) 07/24/2023 0759   PROT 6.1 (L) 07/24/2023 0759   ALBUMIN 3.6 07/24/2023 0759   AST 13 (L) 07/24/2023 0759   ALT 28 07/24/2023 0759   ALKPHOS 53 07/24/2023 0759   BILITOT 0.3 07/24/2023 0759   GFRNONAA >60 07/24/2023 0759   GFRAA >90 06/16/2012 0048    No results found for: "SPEP", "UPEP"  Lab Results  Component Value Date   WBC 1.1 (L) 07/24/2023   NEUTROABS 0.4 (LL) 07/20/2023   HGB 11.3 (L) 07/24/2023   HCT 33.0 (L) 07/24/2023   MCV 84.0 07/24/2023   PLT 47 (L) 07/24/2023      Chemistry      Component Value Date/Time   NA 138 07/24/2023 0759   K 3.6 07/24/2023 0759   CL 105 07/24/2023 0759   CO2 27 07/24/2023 0759   BUN 13 07/24/2023 0759   CREATININE 0.89 07/24/2023 0759      Component Value Date/Time   CALCIUM 8.5 (L) 07/24/2023 0759   ALKPHOS 53 07/24/2023 0759   AST 13 (L) 07/24/2023 0759   ALT 28 07/24/2023 0759   BILITOT 0.3 07/24/2023 0759       RADIOGRAPHIC STUDIES: I have personally reviewed the radiological images as listed and agreed with the findings in the report. CT Angio Chest PE W and/or Wo Contrast  Result Date: 07/20/2023 CLINICAL DATA:  Short of breath and chest pain beginning late last night. Patient stopped taking Eliquis on Tuesday. EXAM: CT ANGIOGRAPHY CHEST WITH CONTRAST TECHNIQUE: Multidetector CT imaging of  the chest was performed using the standard protocol during bolus administration of intravenous contrast. Multiplanar CT image reconstructions and MIPs were obtained to evaluate the vascular anatomy. RADIATION DOSE REDUCTION: This exam was performed according to the departmental dose-optimization program which includes automated exposure control, adjustment of the mA  and/or kV according to patient size and/or use of iterative reconstruction technique. CONTRAST:  75mL OMNIPAQUE IOHEXOL 350 MG/ML SOLN COMPARISON:  06/22/2023. FINDINGS: Cardiovascular: Pulmonary arteries are well opacified. No evidence of a pulmonary embolism. Previously noted right-sided pulmonary emboli have resolved. Heart is normal in size and configuration. No pericardial effusion. Great vessels are normal in caliber. No aortic dissection or atherosclerosis. Arch branch vessels are widely patent. Mediastinum/Nodes: No neck base, mediastinal or hilar masses. No enlarged lymph nodes. Trachea and esophagus are unremarkable. Lungs/Pleura: Mild ground-glass opacities in the lower lobes suspected to be atelectasis. More confluent type opacity noted in the lower lobes on the prior study resolved. Remainder of the lungs is clear. No pleural effusion or pneumothorax. Upper Abdomen: No acute findings. 1.9 cm gallstone, dependent aspect of a distended gallbladder. No gallbladder wall thickening or adjacent inflammation. Musculoskeletal: No chest wall abnormality. No acute or significant osseous findings. Review of the MIP images confirms the above findings. IMPRESSION: 1. No evidence of a pulmonary embolism. Previously noted right-sided pulmonary emboli have resolved. 2. No acute findings. 3. Cholelithiasis. 4. Mild ground-glass opacities in the lower lobes suspected to be atelectasis. Electronically Signed   By: Amie Portland M.D.   On: 07/20/2023 09:15   DG Chest 2 View  Result Date: 07/20/2023 CLINICAL DATA:  Chest pain EXAM: CHEST - 2 VIEW  COMPARISON:  06/22/2023 FINDINGS: Porta catheter on the right with tip at the upper cavoatrial junction. Normal heart size and mediastinal contours. No acute infiltrate or edema. No effusion or pneumothorax. No acute osseous findings. IMPRESSION: No active cardiopulmonary disease. Electronically Signed   By: Tiburcio Pea M.D.   On: 07/20/2023 06:59   CT ABDOMEN PELVIS W CONTRAST  Result Date: 06/25/2023 CLINICAL DATA:  Inpatient. Testicular seminoma. HIV. Restaging. * Tracking Code: BO * EXAM: CT ABDOMEN AND PELVIS WITH CONTRAST TECHNIQUE: Multidetector CT imaging of the abdomen and pelvis was performed using the standard protocol following bolus administration of intravenous contrast. RADIATION DOSE REDUCTION: This exam was performed according to the departmental dose-optimization program which includes automated exposure control, adjustment of the mA and/or kV according to patient size and/or use of iterative reconstruction technique. CONTRAST:  OMNIPAQUE IOHEXOL 300 MG/ML  SOLN COMPARISON:  06/22/2023 MRI abdomen. 04/28/2023 CT chest, abdomen and pelvis. FINDINGS: Lower chest: Solid peripheral basilar 2.0 cm right lower lobe pulmonary nodule (series 4/image 26), new from 04/28/2023 CT, similar to 06/22/2023 chest CT, favoring evolving pulmonary infarct related 2 recent diagnosis of acute pulmonary embolism. Additional patchy consolidation at the posterior right lung base seen on prior chest CT has resolved. Superior approach central venous catheter tip seen at the cavoatrial junction. Hepatobiliary: Normal liver size. No liver mass. Cholelithiasis. No biliary ductal dilatation. Pancreas: Normal, with no mass or duct dilation. Spleen: Normal size. No mass. Adrenals/Urinary Tract: Stable 1.7 cm right adrenal and 1.6 cm left adrenal nodules characterized as benign adenomas on recent MRI. Normal kidneys with no hydronephrosis and no renal mass. Normal bladder. Stomach/Bowel: Normal non-distended stomach.  Normal caliber small bowel with no small bowel wall thickening. Normal appendix. Normal large bowel with no diverticulosis, large bowel wall thickening or pericolonic fat stranding. Vascular/Lymphatic: Normal caliber abdominal aorta. Patent portal, splenic, hepatic and renal veins. Stable mildly enlarged 1.2 cm periportal node (series 2/image 28). Borderline mildly enlarged bilateral inguinal lymph nodes up to 1.4 cm on the left (series 2/image 96), not substantially changed. Stable bilateral external iliac lymphadenopathy up to 1.3 cm on the right (series 2/image  88). Stable mild bilateral common iliac adenopathy up to 1.2 cm on the left (series 2/image 66). Reproductive: Normal size prostate. Other: No pneumoperitoneum, ascites or focal fluid collection. Musculoskeletal: No aggressive appearing focal osseous lesions. Minimal lower thoracic spondylosis. IMPRESSION: 1. Stable mild periportal, bilateral inguinal, bilateral external iliac and bilateral common iliac lymphadenopathy. 2. No new or progressive metastatic disease in the abdomen or pelvis. 3. Solid peripheral basilar 2.0 cm right lower lobe pulmonary nodule, new from 04/28/2023 CT, similar to recent 06/22/2023 chest CT, favoring evolving pulmonary infarct related to recent diagnosis of acute pulmonary embolism. Additional patchy consolidation at the posterior right lung base seen on prior chest CT has resolved. 4. Cholelithiasis. 5. Stable bilateral adrenal nodules characterized as benign adenomas on recent MRI, requiring no further imaging follow-up. Electronically Signed   By: Delbert Phenix M.D.   On: 06/25/2023 19:53

## 2023-07-23 NOTE — Assessment & Plan Note (Signed)
Labs showed improvement.

## 2023-07-24 ENCOUNTER — Ambulatory Visit: Payer: 59 | Admitting: Family

## 2023-07-24 ENCOUNTER — Inpatient Hospital Stay: Payer: Medicaid Other

## 2023-07-24 ENCOUNTER — Other Ambulatory Visit: Payer: Self-pay

## 2023-07-24 ENCOUNTER — Inpatient Hospital Stay (HOSPITAL_BASED_OUTPATIENT_CLINIC_OR_DEPARTMENT_OTHER): Payer: Medicaid Other

## 2023-07-24 VITALS — BP 107/76 | HR 82 | Temp 99.0°F | Resp 16

## 2023-07-24 VITALS — BP 124/81 | HR 86 | Temp 97.7°F | Resp 16 | Ht 71.0 in | Wt 259.2 lb

## 2023-07-24 DIAGNOSIS — K8020 Calculus of gallbladder without cholecystitis without obstruction: Secondary | ICD-10-CM

## 2023-07-24 DIAGNOSIS — D61818 Other pancytopenia: Secondary | ICD-10-CM | POA: Diagnosis not present

## 2023-07-24 DIAGNOSIS — C6292 Malignant neoplasm of left testis, unspecified whether descended or undescended: Secondary | ICD-10-CM

## 2023-07-24 DIAGNOSIS — R110 Nausea: Secondary | ICD-10-CM | POA: Diagnosis not present

## 2023-07-24 DIAGNOSIS — Z9189 Other specified personal risk factors, not elsewhere classified: Secondary | ICD-10-CM

## 2023-07-24 DIAGNOSIS — I2699 Other pulmonary embolism without acute cor pulmonale: Secondary | ICD-10-CM | POA: Diagnosis not present

## 2023-07-24 DIAGNOSIS — I2609 Other pulmonary embolism with acute cor pulmonale: Secondary | ICD-10-CM | POA: Diagnosis not present

## 2023-07-24 LAB — CBC (CANCER CENTER ONLY)
HCT: 33 % — ABNORMAL LOW (ref 39.0–52.0)
Hemoglobin: 11.3 g/dL — ABNORMAL LOW (ref 13.0–17.0)
MCH: 28.8 pg (ref 26.0–34.0)
MCHC: 34.2 g/dL (ref 30.0–36.0)
MCV: 84 fL (ref 80.0–100.0)
Platelet Count: 47 10*3/uL — ABNORMAL LOW (ref 150–400)
RBC: 3.93 MIL/uL — ABNORMAL LOW (ref 4.22–5.81)
RDW: 13.5 % (ref 11.5–15.5)
WBC Count: 1.1 10*3/uL — ABNORMAL LOW (ref 4.0–10.5)
nRBC: 0 % (ref 0.0–0.2)

## 2023-07-24 LAB — CMP (CANCER CENTER ONLY)
ALT: 28 U/L (ref 0–44)
AST: 13 U/L — ABNORMAL LOW (ref 15–41)
Albumin: 3.6 g/dL (ref 3.5–5.0)
Alkaline Phosphatase: 53 U/L (ref 38–126)
Anion gap: 6 (ref 5–15)
BUN: 13 mg/dL (ref 6–20)
CO2: 27 mmol/L (ref 22–32)
Calcium: 8.5 mg/dL — ABNORMAL LOW (ref 8.9–10.3)
Chloride: 105 mmol/L (ref 98–111)
Creatinine: 0.89 mg/dL (ref 0.61–1.24)
GFR, Estimated: >60 mL/min (ref >60)
Glucose, Bld: 111 mg/dL — ABNORMAL HIGH (ref 70–99)
Potassium: 3.6 mmol/L (ref 3.5–5.1)
Sodium: 138 mmol/L (ref 135–145)
Total Bilirubin: 0.3 mg/dL (ref <1.2)
Total Protein: 6.1 g/dL — ABNORMAL LOW (ref 6.5–8.1)

## 2023-07-24 MED ORDER — SODIUM CHLORIDE 0.9% FLUSH
10.0000 mL | INTRAVENOUS | Status: DC | PRN
  Administered 2023-07-24: 10 mL

## 2023-07-24 MED ORDER — SODIUM CHLORIDE 0.9% FLUSH
10.0000 mL | Freq: Once | INTRAVENOUS | Status: AC
  Administered 2023-07-24: 10 mL

## 2023-07-24 MED ORDER — PROCHLORPERAZINE MALEATE 10 MG PO TABS
10.0000 mg | ORAL_TABLET | Freq: Once | ORAL | Status: AC
  Administered 2023-07-24: 10 mg via ORAL
  Filled 2023-07-24: qty 1

## 2023-07-24 MED ORDER — SODIUM CHLORIDE 0.9 % IV SOLN: Freq: Once | INTRAVENOUS | Status: AC

## 2023-07-24 MED ORDER — HEPARIN SOD (PORK) LOCK FLUSH 100 UNIT/ML IV SOLN
500.0000 [IU] | Freq: Once | INTRAVENOUS | Status: AC | PRN
  Administered 2023-07-24: 500 [IU]

## 2023-07-24 MED ORDER — SODIUM CHLORIDE 0.9 % IV SOLN
30.0000 [IU] | Freq: Once | INTRAVENOUS | Status: AC
  Administered 2023-07-24: 30 [IU] via INTRAVENOUS
  Filled 2023-07-24: qty 10

## 2023-07-24 NOTE — Patient Instructions (Addendum)
[facility] - A DEPT OF [facility]  Discharge Instructions: Thank you for choosing [facility] to provide your oncology and hematology care.   If you have a lab appointment with the Cancer Center, please go directly to the Cancer Center and check in at the registration area.   Wear comfortable clothing and clothing appropriate for easy access to any Portacath or PICC line.   We strive to give you quality time with your provider. You may need to reschedule your appointment if you arrive late (15 or more minutes).  Arriving late affects you and other patients whose appointments are after yours.  Also, if you miss three or more appointments without notifying the office, you may be dismissed from the clinic at the provider's discretion.      For prescription refill requests, have your pharmacy contact our office and allow 72 hours for refills to be completed.    Today you received the following chemotherapy and/or immunotherapy agent: Bleocin      To help prevent nausea and vomiting after your treatment, we encourage you to take your nausea medication as directed.  BELOW ARE SYMPTOMS THAT SHOULD BE REPORTED IMMEDIATELY: *FEVER GREATER THAN 100.4 F (38 C) OR HIGHER *CHILLS OR SWEATING *NAUSEA AND VOMITING THAT IS NOT CONTROLLED WITH YOUR NAUSEA MEDICATION *UNUSUAL SHORTNESS OF BREATH *UNUSUAL BRUISING OR BLEEDING *URINARY PROBLEMS (pain or burning when urinating, or frequent urination) *BOWEL PROBLEMS (unusual diarrhea, constipation, pain near the anus) TENDERNESS IN MOUTH AND THROAT WITH OR WITHOUT PRESENCE OF ULCERS (sore throat, sores in mouth, or a toothache) UNUSUAL RASH, SWELLING OR PAIN  UNUSUAL VAGINAL DISCHARGE OR ITCHING   Items with * indicate a potential emergency and should be followed up as soon as possible or go to the Emergency Department if any problems should occur.  Please show the CHEMOTHERAPY ALERT CARD or IMMUNOTHERAPY  ALERT CARD at check-in to the Emergency Department and triage nurse.  Should you have questions after your visit or need to cancel or reschedule your appointment, please contact [facility] - A DEPT OF [facility]  Dept: [phone]  and follow the prompts.  Office hours are 8:00 a.m. to 4:30 p.m. Monday - Friday. Please note that voicemails left after 4:00 p.m. may not be returned until the following business day.  We are closed weekends and major holidays. You have access to a nurse at all times for urgent questions. Please call the main number to the clinic Dept: [phone] and follow the prompts.   For any non-urgent questions, you may also contact your provider using MyChart. We now offer e-Visits for anyone 31 and older to request care online for non-urgent symptoms. For details visit [email or web address].   Also download the MyChart app! Go to the app store, search "MyChart", open the app, select [facility], and log in with your MyChart username and password.  Bleomycin Injection What is this medication? BLEOMYCIN (blee oh MYE sin) treats some types of cancer. It works by slowing down the growth of cancer cells. It may also be used to prevent and treat the buildup of fluid around the lungs that can be caused by cancer. It works by helping your body create less space around your lungs, so that fluid cannot build up. This medicine may be used for other purposes; ask your health care provider or pharmacist if you have questions. COMMON BRAND NAME(S): Blenoxane What should I  tell my care team before I take this medication? They need to know if you have any of these conditions: Kidney disease Lung disease Recent or ongoing radiation therapy Tobacco use An unusual or allergic reaction to bleomycin, other medications, foods, dyes, or preservatives Pregnant or trying to get pregnant Breastfeeding How should I use this medication? This medication is  injected into a vein or a body cavity. It can also be injected into a muscle or under the skin. It is given by your care team in a hospital or clinic setting. Talk to your care team about the use of this medication in children. Special care may be needed. Overdosage: If you think you have taken too much of this medicine contact a poison control center or emergency room at once. NOTE: This medicine is only for you. Do not share this medicine with others. What if I miss a dose? Keep appointments for follow-up doses. It is important not to miss your dose. Call your care team if you are unable to keep an appointment. What may interact with this medication? Do not take this medication with any of the following: Brentuximab vedotin This medication may also interact with the following: Certain antibiotics Cisplatin Cyclosporine Diuretics Fosc[place]et Medications to increase blood counts, such as filgrastim, pegfilgrastim, sargramostim Vaccines This list may not describe all possible interactions. Give your health care provider a list of all the medicines, herbs, non-prescription drugs, or dietary supplements you use. Also tell them if you smoke, drink alcohol, or use illegal drugs. Some items may interact with your medicine. What should I watch for while using this medication? Your condition will be monitored carefully while you are receiving this medication. This medication may make you feel generally unwell. This is not uncommon as chemotherapy can affect healthy cells as well as cancer cells. Report any side effects. Continue your course of treatment even though you feel ill unless your care team tells you to stop. This medication may increase your risk of getting an infection. Call your care team for advice if you get a fever, chills, sore throat, or other symptoms of a cold or flu. Do not treat yourself. Try to avoid being around people who are sick. Avoid taking medications that contain aspirin,  acetaminophen, ibuprofen, naproxen, or ketoprofen unless instructed by your care team. These medications may hide a fever. Talk to your care team if you may be pregnant. Serious birth defects can occur if you take this medication during pregnancy. Contraception is recommended while taking this medication. Your care team can help you find the option that works for you. Do not breastfeed while taking this medication. There is a maximum amount of this medication you should receive throughout your life. The amount depends on the medical condition being treated and your overall health. Your care team will watch how much of this medication you receive. Tell your care team if you have taken this medication before. What side effects may I notice from receiving this medication? Side effects that you should report to your care team as soon as possible: Allergic reactions--skin rash, itching, hives, swelling of the face, lips, tongue, or throat Dry cough, shortness of breath or trouble breathing Infusion reactions--chest pain, shortness of breath or trouble breathing, feeling faint or lightheaded Kidney injury--decrease in the amount of urine, swelling of the ankles, hands, or feet Liver injury--right upper belly pain, loss of appetite, nausea, light-colored stool, dark yellow or brown urine, yellowing skin or eyes, unusual weakness or fatigue Side effects  that usually do not require medical attention (report to your care team if they continue or are bothersome): Change in nail shape, thickness, or color Change in skin color Hair loss Loss of appetite with weight loss Pain, redness, or irritation at injection site Vomiting This list may not describe all possible side effects. Call your doctor for medical advice about side effects. You may report side effects to FDA at 1-800-FDA-1088. Where should I keep my medication? This medication is given in a hospital or clinic. It will not be stored at home. NOTE:  This sheet is a summary. It may not cover all possible information. If you have questions about this medicine, talk to your doctor, pharmacist, or health care provider.  2024 Elsevier/Gold Standard (2022-01-02 00:00:00)

## 2023-07-24 NOTE — Assessment & Plan Note (Signed)
Take Compazine twice daily on days of chemotherapy as prophylaxis

## 2023-07-24 NOTE — Assessment & Plan Note (Signed)
Monitor for neutropenic fever Discussed preventing constipation by adding Benefiber, MiraLAX as needed, stool softener as needed. Monitor for bleeding and clotting

## 2023-07-24 NOTE — Assessment & Plan Note (Signed)
No signs of cholecystitis. Discussed precautions, dietary restrictions.

## 2023-07-24 NOTE — Progress Notes (Unsigned)
Brief Narrative   Patient ID: Steve Stewart, male    DOB: [birth year], 32 y.o.   MRN: [id number]  Mr. Guanzon is a 32 y/o caucasian male diagnosed with HIV disease in November 2024 with risk factor of MSM. Initial viral load of 59,200 with CD4 count of 255. Enters care at [facility] Stage 2. Genotype with E138A (R- Ripivilrine). No history of opportunistic infection. Quantiferon Gold and [id number] negative. Rapid start with Biktarvy.   Subjective:    No chief complaint on file.   HPI:  Steve Stewart is a 32 y.o. male with HIV disease last seen on 06/28/23 for newly diagnosed HIV-1. Initial viral load was 59,200 with CD4 count 255. Entered care at [facility] Stage 2. Genotype with E138A with probable resistance to rilpivirine (Cabenuva contraindicated). Quantiferon Gold and [id number] negative. Negative for Hepatitis B Surface B antigen and not immune to Hepatitis B or Hepatitis A. Kidney function, liver function and electrolytes are normal.  Rapid start with Biktarvy. Here today for 1 month follow up.     Denies fevers, chills, night sweats, headaches, changes in vision, neck pain/stiffness, nausea, diarrhea, vomiting, lesions or rashes.  Lab Results  Component Value Date   CD4TCELL 14 (L) 06/28/2023   CD4TABS 255 (L) 06/28/2023   Lab Results  Component Value Date   HIV1RNAQUANT 117,000 (H) 06/28/2023     No Known Allergies    Outpatient Medications Prior to Visit  Medication Sig Dispense Refill   acetaminophen (TYLENOL) 650 MG CR tablet Take 1,300 mg by mouth every 8 (eight) hours as needed for pain.     albuterol (VENTOLIN HFA) 108 (90 Base) MCG/ACT inhaler Inhale 1 puff into the lungs every 6 (six) hours as needed for wheezing or shortness of breath.     apixaban (ELIQUIS) 5 MG TABS tablet Take 5 mg by mouth 2 (two) times daily. ON HOLD as of 07/24/23     bictegravir-emtricitabine-tenofovir AF (BIKTARVY) 50-200-25 MG TABS tablet Take 1 tablet by mouth daily. 30 tablet 2    dexamethasone (DECADRON) 4 MG tablet Take 2 tablets (8 mg total) by mouth daily. Start the day after last cisplatin dose on day 6 of chemo x 3 days.Take with food. 30 tablet 1   famotidine (PEPCID) 20 MG tablet Take 1 tablet (20 mg total) by mouth 2 (two) times daily. 30 tablet 2   lidocaine (XYLOCAINE) 2 % solution Use as directed 15 mLs in the mouth or throat every 3 (three) hours as needed for mouth pain. Mix 1 to 1 ratio with Maalox to rinse for pain 100 mL 0   lidocaine-prilocaine (EMLA) cream Apply to affected area once 30 g 3   ondansetron (ZOFRAN) 8 MG tablet Take 1 tablet (8 mg total) by mouth every 8 (eight) hours as needed for nausea or vomiting. Start on the third day after last cisplatin dose on day 8 of chemo 30 tablet 1   prochlorperazine (COMPAZINE) 10 MG tablet Take 1 tablet (10 mg total) by mouth every 6 (six) hours as needed for nausea or vomiting. 30 tablet 1   traZODone (DESYREL) 50 MG tablet Take 1 tablet (50 mg total) by mouth at bedtime. 90 tablet 1   Facility-Administered Medications Prior to Visit  Medication Dose Route Frequency Provider Last Rate Last Admin   [COMPLETED] prochlorperazine (COMPAZINE) tablet 10 mg  10 mg Oral Once [name], MD   10 mg at 07/24/23 0921   sodium chloride flush (NS) 0.9 % injection  10 mL  10 mL Intracatheter PRN [name], MD   10 mL at 07/24/23 1018     Past Medical History:  Diagnosis Date   HIV infection [facility])    Medical history non-contributory    Testicular cancer [facility])      Past Surgical History:  Procedure Laterality Date   IR IMAGING GUIDED PORT INSERTION  06/18/2023   NO PAST SURGERIES     ORCHIECTOMY Left 05/02/2023   Procedure: LEFT INGUINAL ORCHIECTOMY;  Surgeon: Rene Paci, MD;  Location: [facility] ORS;  Service: Urology;  Laterality: Left;      Review of Systems  Constitutional:  Negative for appetite change, chills, fatigue, fever and unexpected weight change.  Eyes:  Negative for visual  disturbance.  Respiratory:  Negative for cough, chest tightness, shortness of breath and wheezing.   Cardiovascular:  Negative for chest pain and leg swelling.  Gastrointestinal:  Negative for abdominal pain, constipation, diarrhea, nausea and vomiting.  Genitourinary:  Negative for dysuria, flank pain, frequency, genital sores, hematuria and urgency.  Skin:  Negative for rash.  Allergic/Immunologic: Negative for immunocompromised state.  Neurological:  Negative for dizziness and headaches.      Objective:    There were no vitals taken for this visit. Nursing note and vital signs reviewed.  Physical Exam Constitutional:      General: He is not in acute distress.    Appearance: He is well-developed.  Eyes:     Conjunctiva/sclera: Conjunctivae normal.  Cardiovascular:     Rate and Rhythm: Normal rate and regular rhythm.     Heart sounds: Normal heart sounds. No murmur heard.    No friction rub. No gallop.  Pulmonary:     Effort: Pulmonary effort is normal. No respiratory distress.     Breath sounds: Normal breath sounds. No wheezing or rales.  Chest:     Chest wall: No tenderness.  Abdominal:     General: Bowel sounds are normal.     Palpations: Abdomen is soft.     Tenderness: There is no abdominal tenderness.  Musculoskeletal:     Cervical back: Neck supple.  Lymphadenopathy:     Cervical: No cervical adenopathy.  Skin:    General: Skin is warm and dry.     Findings: No rash.  Neurological:     Mental Status: He is alert and oriented to person, place, and time.  Psychiatric:        Behavior: Behavior normal.        Thought Content: Thought content normal.        Judgment: Judgment normal.         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  Schnittker maintain his acetaminophen, albuterol, dexamethasone, ondansetron, prochlorperazine, lidocaine-prilocaine, Biktarvy, famotidine, lidocaine, traZODone, and apixaban.   No orders of the defined types were placed in this encounter.    Follow-up: No follow-ups on file. or sooner if needed.    Marcos Eke, MSN, FNP-C Nurse Practitioner [facility] for Infectious Disease [facility] Main number: [phone]

## 2023-07-25 ENCOUNTER — Other Ambulatory Visit: Payer: Self-pay

## 2023-07-25 ENCOUNTER — Encounter: Payer: Self-pay | Admitting: *Deleted

## 2023-07-25 ENCOUNTER — Telehealth: Payer: Self-pay

## 2023-07-25 NOTE — Progress Notes (Signed)
Patient called after receiving my card per my request to discuss grant expense.  Advised what would be needed to submit expense from grant and how they are covered as well as processing time. He verbalized understanding.  Patient also had questions regarding applying for SSDI. Advised patient I would reach out to social worker to have them contact him for follow up. Staff message sent to [facility].  He has my card for any additional financial questions or concerns.

## 2023-07-25 NOTE — Telephone Encounter (Signed)
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 Clinical Social Worker attempted to contact patient by phone. No answer. CSW unable to leave vm due to mailbox being full.  CSW will attempt again. Marguerita Merles, LCSWA Clinical Social Worker [facility]

## 2023-07-26 ENCOUNTER — Inpatient Hospital Stay (HOSPITAL_COMMUNITY)
Admission: EM | Admit: 2023-07-26 | Discharge: 2023-08-03 | DRG: 175 | Disposition: A | Discharge status: Home / Self Care | Payer: 59 | Attending: Internal Medicine | Admitting: Internal Medicine

## 2023-07-26 ENCOUNTER — Other Ambulatory Visit: Payer: Self-pay

## 2023-07-26 ENCOUNTER — Emergency Department (HOSPITAL_COMMUNITY): Payer: 59

## 2023-07-26 ENCOUNTER — Inpatient Hospital Stay: Payer: Medicaid Other

## 2023-07-26 DIAGNOSIS — I82412 Acute embolism and thrombosis of left femoral vein: Secondary | ICD-10-CM | POA: Diagnosis present

## 2023-07-26 DIAGNOSIS — B20 Human immunodeficiency virus [HIV] disease: Secondary | ICD-10-CM | POA: Diagnosis present

## 2023-07-26 DIAGNOSIS — E278 Other specified disorders of adrenal gland: Secondary | ICD-10-CM | POA: Diagnosis present

## 2023-07-26 DIAGNOSIS — Z6835 Body mass index (BMI) 35.0-35.9, adult: Secondary | ICD-10-CM

## 2023-07-26 DIAGNOSIS — C6292 Malignant neoplasm of left testis, unspecified whether descended or undescended: Secondary | ICD-10-CM | POA: Diagnosis present

## 2023-07-26 DIAGNOSIS — I2699 Other pulmonary embolism without acute cor pulmonale: Principal | ICD-10-CM | POA: Diagnosis present

## 2023-07-26 DIAGNOSIS — Z86718 Personal history of other venous thrombosis and embolism: Secondary | ICD-10-CM

## 2023-07-26 DIAGNOSIS — I82409 Acute embolism and thrombosis of unspecified deep veins of unspecified lower extremity: Secondary | ICD-10-CM

## 2023-07-26 DIAGNOSIS — D61810 Antineoplastic chemotherapy induced pancytopenia: Secondary | ICD-10-CM | POA: Diagnosis present

## 2023-07-26 DIAGNOSIS — E871 Hypo-osmolality and hyponatremia: Secondary | ICD-10-CM | POA: Diagnosis present

## 2023-07-26 DIAGNOSIS — Z79899 Other long term (current) drug therapy: Secondary | ICD-10-CM

## 2023-07-26 DIAGNOSIS — I82442 Acute embolism and thrombosis of left tibial vein: Secondary | ICD-10-CM | POA: Diagnosis present

## 2023-07-26 DIAGNOSIS — D61818 Other pancytopenia: Secondary | ICD-10-CM | POA: Diagnosis present

## 2023-07-26 DIAGNOSIS — I2609 Other pulmonary embolism with acute cor pulmonale: Principal | ICD-10-CM | POA: Diagnosis present

## 2023-07-26 DIAGNOSIS — Z86711 Personal history of pulmonary embolism: Secondary | ICD-10-CM | POA: Diagnosis present

## 2023-07-26 DIAGNOSIS — I82432 Acute embolism and thrombosis of left popliteal vein: Secondary | ICD-10-CM | POA: Diagnosis present

## 2023-07-26 DIAGNOSIS — J9601 Acute respiratory failure with hypoxia: Secondary | ICD-10-CM | POA: Diagnosis present

## 2023-07-26 DIAGNOSIS — Z9079 Acquired absence of other genital organ(s): Secondary | ICD-10-CM

## 2023-07-26 DIAGNOSIS — T451X5A Adverse effect of antineoplastic and immunosuppressive drugs, initial encounter: Secondary | ICD-10-CM | POA: Diagnosis present

## 2023-07-26 DIAGNOSIS — E669 Obesity, unspecified: Secondary | ICD-10-CM | POA: Diagnosis present

## 2023-07-26 DIAGNOSIS — I82452 Acute embolism and thrombosis of left peroneal vein: Secondary | ICD-10-CM | POA: Diagnosis present

## 2023-07-26 DIAGNOSIS — R042 Hemoptysis: Secondary | ICD-10-CM | POA: Diagnosis not present

## 2023-07-26 DIAGNOSIS — Z7901 Long term (current) use of anticoagulants: Secondary | ICD-10-CM

## 2023-07-26 DIAGNOSIS — D649 Anemia, unspecified: Secondary | ICD-10-CM

## 2023-07-26 DIAGNOSIS — Z87891 Personal history of nicotine dependence: Secondary | ICD-10-CM

## 2023-07-26 MED ORDER — ALBUTEROL SULFATE HFA 108 (90 BASE) MCG/ACT IN AERS: 2.0000 | INHALATION_SPRAY | RESPIRATORY_TRACT | Status: DC | PRN

## 2023-07-26 NOTE — Progress Notes (Signed)
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  Clinical Social Worker contacted patient by phone to offer support and assess for needs. Patient expressed primary need for assistance with applying for Ssi/SSDI due to financial impact of being in treatment (Referral to servant center will be completed at patient's next appt). CSW discussed local agency [facility] and getting connected to care (Patient gave CSW permission to send in referral and allow agency to leave vm on his phone). Patient inquired about medicaid retroactive coverage, patient will call billing to discuss further.   Marguerita Merles, LCSWA Clinical Social Worker [facility]

## 2023-07-26 NOTE — ED Triage Notes (Signed)
Pt reports left leg pain behind knee and pain in upper back. Pt reports SHOB and hx of DVT and PE. Pt has been off of Eliquis for the last couple weeks per MD due to low platelet count.

## 2023-07-26 NOTE — ED Provider Notes (Signed)
[place] EMERGENCY DEPARTMENT AT [facility] Provider Note   CSN: [id number] Arrival date & time: 07/26/23  2234     History  Chief Complaint  Patient presents with   Leg Pain   Shortness of Breath    Steve Stewart is a 32 y.o. male with hx of left seminoma s/p orchiectomy, acute PE diagnosed Nov 1st 2024, here with chest pain and SOB.  Patient was started on Eliquis following his PE diagnosis at the start of November, and subsequently had complete resolution of PE on repeat CT scan 1 week ago.  However his oncologist taken him off of Eliquis approximately 2 weeks ago due to severe thrombocytopenia.  The patient reports that earlier today began having acute onset of pain behind his left knee and also near his right shoulder blade, which is pleuritic, and reminiscent of his prior PE.  He denies loss of consciousness or lightheadedness.  He denies dark or bloody stools.  HPI     Home Medications Prior to Admission medications   Medication Sig Start Date End Date Taking? Authorizing Provider  acetaminophen (TYLENOL) 650 MG CR tablet Take 1,300 mg by mouth every 8 (eight) hours as needed for pain.   Yes [provider]  albuterol (VENTOLIN HFA) 108 (90 Base) MCG/ACT inhaler Inhale 1 puff into the lungs every 6 (six) hours as needed for wheezing or shortness of breath. 10/02/22  Yes [provider]  bictegravir-emtricitabine-tenofovir AF (BIKTARVY) 50-200-25 MG TABS tablet Take 1 tablet by mouth daily. 07/09/23  Yes Kuppelweiser, Cassie L, RPH-CPP  dexamethasone (DECADRON) 4 MG tablet Take 2 tablets (8 mg total) by mouth daily. Start the day after last cisplatin dose on day 6 of chemo x 3 days.Take with food. 06/24/23  Yes [name], MD  famotidine (PEPCID) 20 MG tablet Take 1 tablet (20 mg total) by mouth 2 (two) times daily. 07/12/23  Yes [name], MD  lidocaine (XYLOCAINE) 2 % solution Use as directed 15 mLs in the mouth or throat every 3  (three) hours as needed for mouth pain. Mix 1 to 1 ratio with Maalox to rinse for pain 07/17/23  Yes [name], MD  lidocaine-prilocaine (EMLA) cream Apply to affected area once 06/24/23  Yes [name], MD  prochlorperazine (COMPAZINE) 10 MG tablet Take 1 tablet (10 mg total) by mouth every 6 (six) hours as needed for nausea or vomiting. 06/24/23  Yes [name], MD  traZODone (DESYREL) 50 MG tablet Take 1 tablet (50 mg total) by mouth at bedtime. 07/17/23  Yes [name], MD  apixaban (ELIQUIS) 5 MG TABS tablet Take 5 mg by mouth 2 (two) times daily. ON HOLD as of 07/24/23    [provider]  ondansetron (ZOFRAN) 8 MG tablet Take 1 tablet (8 mg total) by mouth every 8 (eight) hours as needed for nausea or vomiting. Start on the third day after last cisplatin dose on day 8 of chemo Patient not taking: Reported on 07/27/2023 06/24/23   [name], MD      Allergies    Patient has no known allergies.    Review of Systems   Review of Systems  Physical Exam Updated Vital Signs BP 119/73   Pulse 75   Temp 98.7 F (37.1 C)   Resp (!) 24   Wt 116.1 kg   SpO2 100%   BMI 35.70 kg/m  Physical Exam Constitutional:      General: He is  not in acute distress. HENT:     Head: Normocephalic and atraumatic.  Eyes:     Conjunctiva/sclera: Conjunctivae normal.     Pupils: Pupils are equal, round, and reactive to light.  Cardiovascular:     Rate and Rhythm: Regular rhythm. Tachycardia present.  Pulmonary:     Effort: Pulmonary effort is normal. No respiratory distress.  Abdominal:     General: There is no distension.     Tenderness: There is no abdominal tenderness.  Skin:    General: Skin is warm and dry.  Neurological:     General: No focal deficit present.     Mental Status: He is alert. Mental status is at baseline.  Psychiatric:        Mood and Affect: Mood normal.        Behavior: Behavior normal.     ED Results / Procedures / Treatments    Labs (all labs ordered are listed, but only abnormal results are displayed) Labs Reviewed  BASIC METABOLIC PANEL - Abnormal; Notable for the following components:      Result Value   Calcium 8.8 (*)    All other components within normal limits  CBC WITH DIFFERENTIAL/PLATELET - Abnormal; Notable for the following components:   WBC 2.2 (*)    RBC 4.20 (*)    Hemoglobin 11.9 (*)    HCT 35.6 (*)    Platelets 135 (*)    Neutro Abs 0.5 (*)    All other components within normal limits  TROPONIN I (HIGH SENSITIVITY)    EKG EKG Interpretation Date/Time:  Thursday July 26 2023 22:44:13 EST Ventricular Rate:  112 PR Interval:  130 QRS Duration:  103 QT Interval:  316 QTC Calculation: 432 R Axis:   86  Text Interpretation: Sinus tachycardia Low voltage, precordial leads RSR' in V1 or V2, right VCD or RVH Minimal ST elevation, lateral leads Confirmed by Alvester Chou [phone]) on 07/27/2023 2:46:49 AM  Radiology CT Angio Chest PE W and/or Wo Contrast  Result Date: 07/27/2023 CLINICAL DATA:  High probability for PE. EXAM: CT ANGIOGRAPHY CHEST WITH CONTRAST TECHNIQUE: Multidetector CT imaging of the chest was performed using the standard protocol during bolus administration of intravenous contrast. Multiplanar CT image reconstructions and MIPs were obtained to evaluate the vascular anatomy. RADIATION DOSE REDUCTION: This exam was performed according to the departmental dose-optimization program which includes automated exposure control, adjustment of the mA and/or kV according to patient size and/or use of iterative reconstruction technique. CONTRAST:  80mL OMNIPAQUE IOHEXOL 350 MG/ML SOLN COMPARISON:  CT angiogram chest 07/19/2028. FINDINGS: Cardiovascular: There are pulmonary emboli within lobar, segmental and subsegmental branches of the right lower lobe there also pulmonary emboli within subsegmental branches of the left upper lobe. Aorta is normal in size. Heart is normal in size. There  is no pericardial effusion. Right chest port catheter tip ends in the distal SVC. Mediastinum/Nodes: No enlarged mediastinal, hilar, or axillary lymph nodes. Thyroid gland, trachea, and esophagus demonstrate no significant findings. Lungs/Pleura: Peripheral patchy ground-glass opacities in the right lower lobe are favored as pulmonary infarcts. There is minimal atelectasis in the right middle lobe. No pleural effusion or pneumothorax. Upper Abdomen: No acute abnormality. Musculoskeletal: No chest wall abnormality. No acute or significant osseous findings. Review of the MIP images confirms the above findings. IMPRESSION: 1. Bilateral pulmonary emboli, right greater than left. Positive for acute PE with CTevidence of right heart strain (RV/LV Ratio = 1.3) consistent with at least submassive (intermediate risk) PE. The presence  of right heart strain has been associated with an increased risk of morbidity and mortality. 2. Peripheral patchy ground-glass opacities in the right lower lobe are favored as pulmonary infarcts. These results were called by telephone at the time of interpretation on 07/27/2023 at 12:39 am to provider [facility] , who verbally acknowledged these results. Electronically Signed   By: Darliss Cheney M.D.   On: 07/27/2023 00:41   DG Chest 2 View  Result Date: 07/26/2023 CLINICAL DATA:  Shortness of breath. History of DVT and pulmonary embolus. Off of Eliquis for couple of weeks. EXAM: CHEST - 2 VIEW COMPARISON:  07/20/2023 FINDINGS: Power port type central venous catheter with tip over the cavoatrial junction region. No pneumothorax. Shallow inspiration. Linear atelectasis or infiltration seen in the right lung base. No pleural effusions. No pneumothorax. Mediastinal contours appear intact. Heart size and pulmonary vascularity are normal. IMPRESSION: Shallow inspiration with linear atelectasis or infiltration in the right lung base. Electronically Signed   By: Burman Nieves M.D.   On:  07/26/2023 23:24    Procedures Procedures    Medications Ordered in ED Medications  albuterol (VENTOLIN HFA) 108 (90 Base) MCG/ACT inhaler 2 puff (has no administration in time range)  apixaban (ELIQUIS) tablet 10 mg (10 mg Oral Given 07/27/23 0231)    Followed by  apixaban (ELIQUIS) tablet 5 mg (has no administration in time range)  iohexol (OMNIPAQUE) 350 MG/ML injection 80 mL (80 mLs Intravenous Contrast Given 07/27/23 0020)  oxyCODONE-acetaminophen (PERCOCET/ROXICET) 5-325 MG per tablet 1 tablet (1 tablet Oral Given 07/27/23 0231)    ED Course/ Medical Decision Making/ A&P Clinical Course as of 07/27/23 0248  Fri Jul 27, 2023  0232 I spoke to Dr Pamelia Hoit from oncology who agrees that the patient needs to be resumed on anticoagulation and did feel that Eliquis is still the best option at this time.  Platelet counts have improved and the patient was taken off of Eliquis, but the patient will need to follow-up with his covering oncologist for recheck of his platelet counts.  I updated the patient and his mother at bedside and they are in agreement with this plan.  We will also give some pain medication for his pain [MT]    Clinical Course User Index [MT] Nishat Livingston, Kermit Balo, MD                                 Medical Decision Making Amount and/or Complexity of Data Reviewed Labs: ordered. Radiology: ordered.  Risk Prescription drug management. Decision regarding hospitalization.   This patient presents to the ED with concern for left posterior leg pain and pleuritic right-sided chest pain. This involves an extensive number of treatment options, and is a complaint that carries with it a high risk of complications and morbidity.  The differential diagnosis includes DVT versus Baker's cyst versus PE versus pneumonia versus pneumothorax versus other  Co-morbidities that complicate the patient evaluation: History of prior thromboocclusive event, at high risk of recurrence, history of  malignancy at high risk of thromboembolic event  Additional history obtained from the patient's mother at bedside  External records from outside source obtained and reviewed including prior CT scans from November, as well as oncology office evaluation at the end of November, taken the patient off of Eliquis due to thrombocytopenia  I ordered and personally interpreted labs.  The pertinent results include: No emergent findings  I ordered imaging studies including CT  PE study I independently visualized and interpreted imaging which showed pulmonary embolism with potential right heart strain pattern I agree with the radiologist interpretation  The patient was maintained on a cardiac monitor.  I personally viewed and interpreted the cardiac monitored which showed an underlying rhythm of: sinus rhythm  Per my interpretation the patient's ECG shows sinus tachycardia no acute ischemic findings  I ordered medication including Eliquis for A/C, percocet for pain  I have reviewed the patients home medicines and have made adjustments as needed  After the interventions noted above, I reevaluated the patient and found that they have: stayed the same  Dispostion:  After consideration of the diagnostic results and the patients response to treatment, I feel that the patent would benefit from medical admission.  Patient stable on room air but mildly tachypneic persistently          Final Clinical Impression(s) / ED Diagnoses Final diagnoses:  Other acute pulmonary embolism, unspecified whether acute cor pulmonale present [facility])    Rx / DC Orders ED Discharge Orders     None         Rachel Samples, Kermit Balo, MD 07/27/23 [phone]

## 2023-07-27 ENCOUNTER — Observation Stay (HOSPITAL_COMMUNITY): Payer: 59

## 2023-07-27 ENCOUNTER — Inpatient Hospital Stay: Payer: Medicaid Other

## 2023-07-27 ENCOUNTER — Emergency Department (HOSPITAL_COMMUNITY): Payer: 59

## 2023-07-27 ENCOUNTER — Encounter (HOSPITAL_COMMUNITY): Payer: Self-pay | Admitting: Family Medicine

## 2023-07-27 DIAGNOSIS — Z79899 Other long term (current) drug therapy: Secondary | ICD-10-CM | POA: Diagnosis not present

## 2023-07-27 DIAGNOSIS — I82442 Acute embolism and thrombosis of left tibial vein: Secondary | ICD-10-CM | POA: Diagnosis present

## 2023-07-27 DIAGNOSIS — I82412 Acute embolism and thrombosis of left femoral vein: Secondary | ICD-10-CM | POA: Diagnosis present

## 2023-07-27 DIAGNOSIS — Z86718 Personal history of other venous thrombosis and embolism: Secondary | ICD-10-CM | POA: Diagnosis not present

## 2023-07-27 DIAGNOSIS — C6292 Malignant neoplasm of left testis, unspecified whether descended or undescended: Secondary | ICD-10-CM | POA: Diagnosis present

## 2023-07-27 DIAGNOSIS — E278 Other specified disorders of adrenal gland: Secondary | ICD-10-CM | POA: Diagnosis present

## 2023-07-27 DIAGNOSIS — Z87891 Personal history of nicotine dependence: Secondary | ICD-10-CM | POA: Diagnosis not present

## 2023-07-27 DIAGNOSIS — J9601 Acute respiratory failure with hypoxia: Secondary | ICD-10-CM | POA: Diagnosis present

## 2023-07-27 DIAGNOSIS — B20 Human immunodeficiency virus [HIV] disease: Secondary | ICD-10-CM

## 2023-07-27 DIAGNOSIS — E871 Hypo-osmolality and hyponatremia: Secondary | ICD-10-CM | POA: Diagnosis present

## 2023-07-27 DIAGNOSIS — D61810 Antineoplastic chemotherapy induced pancytopenia: Secondary | ICD-10-CM

## 2023-07-27 DIAGNOSIS — Z86711 Personal history of pulmonary embolism: Secondary | ICD-10-CM

## 2023-07-27 DIAGNOSIS — I2729 Other secondary pulmonary hypertension: Secondary | ICD-10-CM | POA: Diagnosis not present

## 2023-07-27 DIAGNOSIS — E669 Obesity, unspecified: Secondary | ICD-10-CM | POA: Diagnosis present

## 2023-07-27 DIAGNOSIS — I82452 Acute embolism and thrombosis of left peroneal vein: Secondary | ICD-10-CM | POA: Diagnosis present

## 2023-07-27 DIAGNOSIS — I2602 Saddle embolus of pulmonary artery with acute cor pulmonale: Secondary | ICD-10-CM

## 2023-07-27 DIAGNOSIS — I2609 Other pulmonary embolism with acute cor pulmonale: Secondary | ICD-10-CM | POA: Diagnosis present

## 2023-07-27 DIAGNOSIS — I82432 Acute embolism and thrombosis of left popliteal vein: Secondary | ICD-10-CM | POA: Diagnosis present

## 2023-07-27 DIAGNOSIS — T451X5A Adverse effect of antineoplastic and immunosuppressive drugs, initial encounter: Secondary | ICD-10-CM

## 2023-07-27 DIAGNOSIS — D649 Anemia, unspecified: Secondary | ICD-10-CM | POA: Diagnosis not present

## 2023-07-27 DIAGNOSIS — I2699 Other pulmonary embolism without acute cor pulmonale: Secondary | ICD-10-CM | POA: Diagnosis not present

## 2023-07-27 DIAGNOSIS — Z9079 Acquired absence of other genital organ(s): Secondary | ICD-10-CM | POA: Diagnosis not present

## 2023-07-27 DIAGNOSIS — Z6835 Body mass index (BMI) 35.0-35.9, adult: Secondary | ICD-10-CM | POA: Diagnosis not present

## 2023-07-27 DIAGNOSIS — Z7901 Long term (current) use of anticoagulants: Secondary | ICD-10-CM | POA: Diagnosis not present

## 2023-07-27 DIAGNOSIS — R042 Hemoptysis: Secondary | ICD-10-CM | POA: Diagnosis not present

## 2023-07-27 LAB — CBC WITH DIFFERENTIAL/PLATELET
Abs Immature Granulocytes: 0.03 10*3/uL (ref 0.00–0.07)
Basophils Absolute: 0 10*3/uL (ref 0.0–0.1)
Basophils Relative: 1 %
Eosinophils Absolute: 0 10*3/uL (ref 0.0–0.5)
Eosinophils Relative: 1 %
HCT: 35.6 % — ABNORMAL LOW (ref 39.0–52.0)
Hemoglobin: 11.9 g/dL — ABNORMAL LOW (ref 13.0–17.0)
Immature Granulocytes: 1 %
Lymphocytes Relative: 48 %
Lymphs Abs: 1.1 10*3/uL (ref 0.7–4.0)
MCH: 28.3 pg (ref 26.0–34.0)
MCHC: 33.4 g/dL (ref 30.0–36.0)
MCV: 84.8 fL (ref 80.0–100.0)
Monocytes Absolute: 0.5 10*3/uL (ref 0.1–1.0)
Monocytes Relative: 24 %
Neutro Abs: 0.5 10*3/uL — ABNORMAL LOW (ref 1.7–7.7)
Neutrophils Relative %: 25 %
Platelets: 135 10*3/uL — ABNORMAL LOW (ref 150–400)
RBC: 4.2 MIL/uL — ABNORMAL LOW (ref 4.22–5.81)
RDW: 14.1 % (ref 11.5–15.5)
WBC: 2.2 10*3/uL — ABNORMAL LOW (ref 4.0–10.5)
nRBC: 0 % (ref 0.0–0.2)

## 2023-07-27 LAB — BASIC METABOLIC PANEL
Anion gap: 9 (ref 5–15)
Anion gap: 9 (ref 5–15)
BUN: 18 mg/dL (ref 6–20)
BUN: 15 mg/dL (ref 6–20)
CO2: 25 mmol/L (ref 22–32)
CO2: 22 mmol/L (ref 22–32)
Calcium: 8.8 mg/dL — ABNORMAL LOW (ref 8.9–10.3)
Calcium: 8.8 mg/dL — ABNORMAL LOW (ref 8.9–10.3)
Chloride: 105 mmol/L (ref 98–111)
Chloride: 104 mmol/L (ref 98–111)
Creatinine, Ser: 0.93 mg/dL (ref 0.61–1.24)
Creatinine, Ser: 0.94 mg/dL (ref 0.61–1.24)
GFR, Estimated: >60 mL/min (ref >60)
GFR, Estimated: >60 mL/min (ref >60)
Glucose, Bld: 83 mg/dL (ref 70–99)
Glucose, Bld: 107 mg/dL — ABNORMAL HIGH (ref 70–99)
Potassium: 3.8 mmol/L (ref 3.5–5.1)
Potassium: 4.3 mmol/L (ref 3.5–5.1)
Sodium: 139 mmol/L (ref 135–145)
Sodium: 135 mmol/L (ref 135–145)

## 2023-07-27 LAB — ECHOCARDIOGRAM LIMITED
Area-P 1/2: 4.6 cm2
Calc EF: 67.9 %
S' Lateral: 2.9 cm
Single Plane A2C EF: 68.3 %
Single Plane A4C EF: 68.4 %
Weight: 4096 [oz_av]

## 2023-07-27 LAB — CBC
HCT: 33.6 % — ABNORMAL LOW (ref 39.0–52.0)
Hemoglobin: 11.4 g/dL — ABNORMAL LOW (ref 13.0–17.0)
MCH: 28.6 pg (ref 26.0–34.0)
MCHC: 33.9 g/dL (ref 30.0–36.0)
MCV: 84.2 fL (ref 80.0–100.0)
Platelets: 140 10*3/uL — ABNORMAL LOW (ref 150–400)
RBC: 3.99 MIL/uL — ABNORMAL LOW (ref 4.22–5.81)
RDW: 14.2 % (ref 11.5–15.5)
WBC: 2.4 10*3/uL — ABNORMAL LOW (ref 4.0–10.5)
nRBC: 0 % (ref 0.0–0.2)

## 2023-07-27 LAB — TROPONIN I (HIGH SENSITIVITY): Troponin I (High Sensitivity): 2 ng/L (ref <18)

## 2023-07-27 MED ORDER — APIXABAN 5 MG PO TABS: 5.0000 mg | ORAL_TABLET | Freq: Two times a day (BID) | ORAL | Status: DC

## 2023-07-27 MED ORDER — ACETAMINOPHEN 650 MG RE SUPP: 650.0000 mg | Freq: Four times a day (QID) | RECTAL | Status: DC | PRN

## 2023-07-27 MED ORDER — HYDROMORPHONE HCL 1 MG/ML IJ SOLN
0.5000 mg | Freq: Once | INTRAMUSCULAR | Status: AC
  Administered 2023-07-27: 0.5 mg via INTRAVENOUS
  Filled 2023-07-27: qty 0.5

## 2023-07-27 MED ORDER — APIXABAN 5 MG PO TABS
10.0000 mg | ORAL_TABLET | Freq: Two times a day (BID) | ORAL | Status: DC
  Administered 2023-07-27 – 2023-07-28 (×2): 10 mg via ORAL
  Filled 2023-07-27 (×2): qty 2

## 2023-07-27 MED ORDER — BICTEGRAVIR-EMTRICITAB-TENOFOV 50-200-25 MG PO TABS
1.0000 | ORAL_TABLET | Freq: Every day | ORAL | Status: DC
  Administered 2023-07-27 – 2023-08-03 (×8): 1 via ORAL
  Filled 2023-07-27 (×8): qty 1

## 2023-07-27 MED ORDER — ALBUTEROL SULFATE (2.5 MG/3ML) 0.083% IN NEBU
2.5000 mg | INHALATION_SOLUTION | RESPIRATORY_TRACT | Status: DC | PRN
  Administered 2023-07-29: 2.5 mg via RESPIRATORY_TRACT
  Filled 2023-07-27: qty 3

## 2023-07-27 MED ORDER — MAGIC MOUTHWASH W/LIDOCAINE: 15.0000 mL | ORAL | Status: DC | PRN

## 2023-07-27 MED ORDER — TRAZODONE HCL 50 MG PO TABS
50.0000 mg | ORAL_TABLET | Freq: Every day | ORAL | Status: DC
  Administered 2023-07-27 – 2023-08-02 (×7): 50 mg via ORAL
  Filled 2023-07-27 (×7): qty 1

## 2023-07-27 MED ORDER — APIXABAN 5 MG PO TABS: 10.0000 mg | ORAL_TABLET | Freq: Two times a day (BID) | ORAL | Status: DC

## 2023-07-27 MED ORDER — DEXAMETHASONE 4 MG PO TABS
4.0000 mg | ORAL_TABLET | Freq: Every day | ORAL | Status: AC
  Administered 2023-07-27: 4 mg via ORAL
  Filled 2023-07-27: qty 1

## 2023-07-27 MED ORDER — LIDOCAINE VISCOUS HCL 2 % MT SOLN: 15.0000 mL | OROMUCOSAL | Status: DC | PRN

## 2023-07-27 MED ORDER — FAMOTIDINE 20 MG PO TABS
20.0000 mg | ORAL_TABLET | Freq: Two times a day (BID) | ORAL | Status: DC
Start: 2023-07-27 — End: 2023-08-03
  Administered 2023-07-27 – 2023-08-03 (×15): 20 mg via ORAL
  Filled 2023-07-27 (×14): qty 1

## 2023-07-27 MED ORDER — ONDANSETRON HCL 4 MG/2ML IJ SOLN
4.0000 mg | Freq: Four times a day (QID) | INTRAMUSCULAR | Status: DC | PRN
Start: 2023-07-27 — End: 2023-07-28
  Administered 2023-07-27: 4 mg via INTRAVENOUS
  Filled 2023-07-27: qty 2

## 2023-07-27 MED ORDER — IOHEXOL 350 MG/ML SOLN
80.0000 mL | Freq: Once | INTRAVENOUS | Status: AC | PRN
  Administered 2023-07-27: 80 mL via INTRAVENOUS

## 2023-07-27 MED ORDER — APIXABAN 5 MG PO TABS
10.0000 mg | ORAL_TABLET | Freq: Two times a day (BID) | ORAL | Status: DC
  Administered 2023-07-27: 10 mg via ORAL
  Filled 2023-07-27: qty 2

## 2023-07-27 MED ORDER — MORPHINE SULFATE (PF) 2 MG/ML IV SOLN
2.0000 mg | INTRAVENOUS | Status: DC | PRN
  Administered 2023-07-27 (×3): 4 mg via INTRAVENOUS
  Filled 2023-07-27 (×4): qty 2

## 2023-07-27 MED ORDER — HYDROMORPHONE HCL 1 MG/ML IJ SOLN
0.5000 mg | INTRAMUSCULAR | Status: DC | PRN
  Administered 2023-07-27 – 2023-07-28 (×3): 0.5 mg via INTRAVENOUS
  Filled 2023-07-27 (×3): qty 0.5

## 2023-07-27 MED ORDER — MORPHINE SULFATE (PF) 4 MG/ML IV SOLN
4.0000 mg | Freq: Once | INTRAVENOUS | Status: AC
  Administered 2023-07-27: 4 mg via INTRAVENOUS
  Filled 2023-07-27: qty 1

## 2023-07-27 MED ORDER — ACETAMINOPHEN 325 MG PO TABS
650.0000 mg | ORAL_TABLET | Freq: Four times a day (QID) | ORAL | Status: DC | PRN
  Filled 2023-07-27: qty 2

## 2023-07-27 MED ORDER — OXYCODONE-ACETAMINOPHEN 5-325 MG PO TABS
1.0000 | ORAL_TABLET | Freq: Once | ORAL | Status: AC
  Administered 2023-07-27: 1 via ORAL
  Filled 2023-07-27: qty 1

## 2023-07-27 MED ORDER — ONDANSETRON HCL 4 MG PO TABS
4.0000 mg | ORAL_TABLET | Freq: Four times a day (QID) | ORAL | Status: DC | PRN
Start: 2023-07-27 — End: 2023-07-28

## 2023-07-27 NOTE — Progress Notes (Signed)
Bilateral lower extremity venous duplex has been completed. Preliminary results can be found in CV Proc through chart review.  Results were given to the patient's nurse, Amy.  07/27/23 9:01 AM Olen Cordial RVT

## 2023-07-27 NOTE — Discharge Instructions (Signed)
Information on my medicine - ELIQUIS (apixaban)  Why was Eliquis prescribed for you? Eliquis was prescribed to treat blood clots that may have been found in the veins of your legs (deep vein thrombosis) or in your lungs (pulmonary embolism) and to reduce the risk of them occurring again.  What do You need to know about Eliquis ? The starting dose is 10 mg (two 5 mg tablets) taken TWICE daily for the FIRST SEVEN (7) DAYS, then the dose is reduced to ONE 5 mg tablet taken TWICE daily.  Eliquis may be taken with or without food.   Try to take the dose about the same time in the morning and in the evening. If you have difficulty swallowing the tablet whole please discuss with your pharmacist how to take the medication safely.  Take Eliquis exactly as prescribed and DO NOT stop taking Eliquis without talking to the doctor who prescribed the medication.  Stopping may increase your risk of developing a new blood clot.  Refill your prescription before you run out.  After discharge, you should have regular check-up appointments with your healthcare provider that is prescribing your Eliquis.    What do you do if you miss a dose? If a dose of ELIQUIS is not taken at the scheduled time, take it as soon as possible on the same day and twice-daily administration should be resumed. The dose should not be doubled to make up for a missed dose.  Important Safety Information A possible side effect of Eliquis is bleeding. You should call your healthcare provider right away if you experience any of the following: Bleeding from an injury or your nose that does not stop. Unusual colored urine (red or dark brown) or unusual colored stools (red or black). Unusual bruising for unknown reasons. A serious fall or if you hit your head (even if there is no bleeding).  Some medicines may interact with Eliquis and might increase your risk of bleeding or clotting while on Eliquis. To help avoid this, consult your  healthcare provider or pharmacist prior to using any new prescription or non-prescription medications, including herbals, vitamins, non-steroidal anti-inflammatory drugs (NSAIDs) and supplements.  This website has more information on Eliquis (apixaban): http://www.eliquis.com/eliquis/home

## 2023-07-27 NOTE — Assessment & Plan Note (Addendum)
Pt on chemo with heme/onc including bleomycin. Most recently got chemo on 12/3 Tumor burden from CA is believed to be minimal (hoped to be in remission following surgery, has distant lymph node enlargement, may be due to CA or may be due to HIV). Watch for recurrent thrombocytopenia from chemo with daily CBC while here and now back on eliquis. Ordering his decadron for this AM (dose 3 of 3 that he typically takes for 3 days following chemo).

## 2023-07-27 NOTE — Progress Notes (Signed)
[facility] CSW Progress Note  Clinical Child psychotherapist  met with patient during inpatient visit to complete referral to there servant center . CSW will submit the application on patient's behalf.  Marguerita Merles, LCSW Clinical Social Worker [facility]

## 2023-07-27 NOTE — Assessment & Plan Note (Addendum)
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 Occurring in setting of holding eliquis ~1 month after initial dx and treatment of PE due to thrombocytopenia this past week. Thrombocytopenia resolved for the moment. EDP d/w Dr. Pamelia Hoit: resume eliquis Already got dose of eliquis 10mg  here in ED before I saw pt Will skip heparin gtt due to above and just leave on eliquis 2d echo Venous US BLE to look for DVT Daily CBC while here to monitor for re-development of thrombocytopenia since he just got another round of chemo 3 days ago. Morphine PRN CP

## 2023-07-27 NOTE — Progress Notes (Signed)
  Echocardiogram 2D Echocardiogram has been performed.  Janalyn Harder 07/27/2023, 12:01 PM

## 2023-07-27 NOTE — H&P (Signed)
History and Physical    Patient: Steve Stewart WUJ:[id number] DOB: [birth year] DOA: 07/26/2023 DOS: the patient was seen and examined on 07/27/2023 PCP: Patient, No Pcp Per  Patient coming from: Home  Chief Complaint:  Chief Complaint  Patient presents with   Leg Pain   Shortness of Breath   HPI: Steve Stewart is a 32 y.o. male with medical history significant of seminoma s/p orchiectomy, acute PE diagnosed Nov 1st 2024 with LLE DVT, HIV on HAART.  Pt currently on chemo for seminoma, just had bleomycin x3 days ago.  Pt had been on eliquis for DVT/PE but this was put on hold this past week due to severe thrombocytopenia with platelets in the 20s.  Pt had complete resolution of PE on repeat CT scan x1 week ago.  Pt in to ED: earlier today began having acute onset of pain behind L knee and near R shoulder blade.  Pain is pleuritic, similar to prior PE.  Associated SOB.  No melena nor hematochezia.  Work up in ED demonstrates acute PE with RHS on CT scan.  Prior thrombocytopenia is improved with platelets currently 135.  Pt reports he also has recurrent pain behind his L knee, same location as DVT last month.   Review of Systems: As mentioned in the history of present illness. All other systems reviewed and are negative. Past Medical History:  Diagnosis Date   HIV infection [facility])    Medical history non-contributory    Testicular cancer [facility])    Past Surgical History:  Procedure Laterality Date   IR IMAGING GUIDED PORT INSERTION  06/18/2023   NO PAST SURGERIES     ORCHIECTOMY Left 05/02/2023   Procedure: LEFT INGUINAL ORCHIECTOMY;  Surgeon: Rene Paci, MD;  Location: [facility];  Service: Urology;  Laterality: Left;   Social History:  reports that he quit smoking about 5 weeks ago. His smoking use included cigarettes. He has never used smokeless tobacco. He reports that he does not drink alcohol and does not use drugs.  No Known Allergies  No family history  on file.  Prior to Admission medications   Medication Sig Start Date End Date Taking? Authorizing Provider  acetaminophen (TYLENOL) 650 MG CR tablet Take 1,300 mg by mouth every 8 (eight) hours as needed for pain.   Yes [provider]  albuterol (VENTOLIN HFA) 108 (90 Base) MCG/ACT inhaler Inhale 1 puff into the lungs every 6 (six) hours as needed for wheezing or shortness of breath. 10/02/22  Yes [provider]  bictegravir-emtricitabine-tenofovir AF (BIKTARVY) 50-200-25 MG TABS tablet Take 1 tablet by mouth daily. 07/09/23  Yes Kuppelweiser, Cassie L, RPH-CPP  dexamethasone (DECADRON) 4 MG tablet Take 2 tablets (8 mg total) by mouth daily. Start the day after last cisplatin dose on day 6 of chemo x 3 days.Take with food. 06/24/23  Yes [name], MD  famotidine (PEPCID) 20 MG tablet Take 1 tablet (20 mg total) by mouth 2 (two) times daily. 07/12/23  Yes [name], MD  lidocaine (XYLOCAINE) 2 % solution Use as directed 15 mLs in the mouth or throat every 3 (three) hours as needed for mouth pain. Mix 1 to 1 ratio with Maalox to rinse for pain 07/17/23  Yes [name], MD  lidocaine-prilocaine (EMLA) cream Apply to affected area once 06/24/23  Yes [name], MD  prochlorperazine (COMPAZINE) 10 MG tablet Take 1 tablet (10 mg total) by mouth every 6 (six) hours as needed for nausea  or vomiting. 06/24/23  Yes [name], MD  traZODone (DESYREL) 50 MG tablet Take 1 tablet (50 mg total) by mouth at bedtime. 07/17/23  Yes [name], MD  apixaban (ELIQUIS) 5 MG TABS tablet Take 5 mg by mouth 2 (two) times daily. ON HOLD as of 07/24/23    [provider]  ondansetron (ZOFRAN) 8 MG tablet Take 1 tablet (8 mg total) by mouth every 8 (eight) hours as needed for nausea or vomiting. Start on the third day after last cisplatin dose on day 8 of chemo Patient not taking: Reported on 07/27/2023 06/24/23   [name], MD    Physical Exam: Vitals:    07/27/23 0245 07/27/23 0300 07/27/23 0315 07/27/23 0330  BP: 135/86 133/77 127/77 122/72  Pulse: 85 92 91 90  Resp: (!) 24 (!) 32 (!) 30 (!) 38  Temp:      TempSrc:      SpO2: 100% 98% 98% 98%  Weight:       Constitutional: NAD, calm, comfortable Respiratory: clear to auscultation bilaterally, no wheezing, no crackles. Normal respiratory effort. No accessory muscle use.  Cardiovascular: Regular rate and rhythm, no murmurs / rubs / gallops. No extremity edema. 2+ pedal pulses. No carotid bruits.  Abdomen: no tenderness, no masses palpated. No hepatosplenomegaly. Bowel sounds positive.  Neurologic: CN 2-12 grossly intact. Sensation intact, DTR normal. Strength 5/5 in all 4.  Psychiatric: Normal judgment and insight. Alert and oriented x 3. Normal mood.   Data Reviewed:    Labs on Admission: I have personally reviewed following labs and imaging studies  CBC: Recent Labs  Lab 07/20/23 0655 07/24/23 0759 07/26/23 2253  WBC 1.4* 1.1* 2.2*  NEUTROABS 0.4*  --  0.5*  HGB 12.6* 11.3* 11.9*  HCT 37.7* 33.0* 35.6*  MCV 84.3 84.0 84.8  PLT 22* 47* 135*   Basic Metabolic Panel: Recent Labs  Lab 07/20/23 0655 07/24/23 0759 07/26/23 2253  NA 134* 138 139  K 4.1 3.6 3.8  CL 103 105 105  CO2 23 27 25   GLUCOSE 97 111* 83  BUN 19 13 18   CREATININE 0.91 0.89 0.93  CALCIUM 9.1 8.5* 8.8*   GFR: Estimated Creatinine Clearance: 145 mL/min (by C-G formula based on SCr of 0.93 mg/dL). Liver Function Tests: Recent Labs  Lab 07/24/23 0759  AST 13*  ALT 28  ALKPHOS 53  BILITOT 0.3  PROT 6.1*  ALBUMIN 3.6   No results for input(s): "LIPASE", "AMYLASE" in the last 168 hours. No results for input(s): "AMMONIA" in the last 168 hours. Coagulation Profile: No results for input(s): "INR", "PROTIME" in the last 168 hours. Cardiac Enzymes: No results for input(s): "CKTOTAL", "CKMB", "CKMBINDEX", "TROPONINI" in the last 168 hours. BNP (last 3 results) No results for input(s): "PROBNP"  in the last 8760 hours. HbA1C: No results for input(s): "HGBA1C" in the last 72 hours. CBG: No results for input(s): "GLUCAP" in the last 168 hours. Lipid Profile: No results for input(s): "CHOL", "HDL", "LDLCALC", "TRIG", "CHOLHDL", "LDLDIRECT" in the last 72 hours. Thyroid Function Tests: No results for input(s): "TSH", "T4TOTAL", "FREET4", "T3FREE", "THYROIDAB" in the last 72 hours. Anemia Panel: No results for input(s): "VITAMINB12", "FOLATE", "FERRITIN", "TIBC", "IRON", "RETICCTPCT" in the last 72 hours. Urine analysis:    Component Value Date/Time   COLORURINE YELLOW 06/22/2023 2026   APPEARANCEUR CLEAR 06/22/2023 2026   LABSPEC 1.025 06/22/2023 2026   PHURINE 6.0 06/22/2023 2026   GLUCOSEU NEGATIVE 06/22/2023 2026   HGBUR NEGATIVE 06/22/2023  2026   BILIRUBINUR NEGATIVE 06/22/2023 2026   KETONESUR NEGATIVE 06/22/2023 2026   PROTEINUR NEGATIVE 06/22/2023 2026   NITRITE NEGATIVE 06/22/2023 2026   LEUKOCYTESUR NEGATIVE 06/22/2023 2026    Radiological Exams on Admission: CT Angio Chest PE W and/or Wo Contrast  Result Date: 07/27/2023 CLINICAL DATA:  High probability for PE. EXAM: CT ANGIOGRAPHY CHEST WITH CONTRAST TECHNIQUE: Multidetector CT imaging of the chest was performed using the standard protocol during bolus administration of intravenous contrast. Multiplanar CT image reconstructions and MIPs were obtained to evaluate the vascular anatomy. RADIATION DOSE REDUCTION: This exam was performed according to the departmental dose-optimization program which includes automated exposure control, adjustment of the mA and/or kV according to patient size and/or use of iterative reconstruction technique. CONTRAST:  80mL OMNIPAQUE IOHEXOL 350 MG/ML SOLN COMPARISON:  CT angiogram chest 07/19/2028. FINDINGS: Cardiovascular: There are pulmonary emboli within lobar, segmental and subsegmental branches of the right lower lobe there also pulmonary emboli within subsegmental branches of the left  upper lobe. Aorta is normal in size. Heart is normal in size. There is no pericardial effusion. Right chest port catheter tip ends in the distal SVC. Mediastinum/Nodes: No enlarged mediastinal, hilar, or axillary lymph nodes. Thyroid gland, trachea, and esophagus demonstrate no significant findings. Lungs/Pleura: Peripheral patchy ground-glass opacities in the right lower lobe are favored as pulmonary infarcts. There is minimal atelectasis in the right middle lobe. No pleural effusion or pneumothorax. Upper Abdomen: No acute abnormality. Musculoskeletal: No chest wall abnormality. No acute or significant osseous findings. Review of the MIP images confirms the above findings. IMPRESSION: 1. Bilateral pulmonary emboli, right greater than left. Positive for acute PE with CTevidence of right heart strain (RV/LV Ratio = 1.3) consistent with at least submassive (intermediate risk) PE. The presence of right heart strain has been associated with an increased risk of morbidity and mortality. 2. Peripheral patchy ground-glass opacities in the right lower lobe are favored as pulmonary infarcts. These results were called by telephone at the time of interpretation on 07/27/2023 at 12:39 am to provider [facility] , who verbally acknowledged these results. Electronically Signed   By: Darliss Cheney M.D.   On: 07/27/2023 00:41   DG Chest 2 View  Result Date: 07/26/2023 CLINICAL DATA:  Shortness of breath. History of DVT and pulmonary embolus. Off of Eliquis for couple of weeks. EXAM: CHEST - 2 VIEW COMPARISON:  07/20/2023 FINDINGS: Power port type central venous catheter with tip over the cavoatrial junction region. No pneumothorax. Shallow inspiration. Linear atelectasis or infiltration seen in the right lung base. No pleural effusions. No pneumothorax. Mediastinal contours appear intact. Heart size and pulmonary vascularity are normal. IMPRESSION: Shallow inspiration with linear atelectasis or infiltration in the right lung  base. Electronically Signed   By: Burman Nieves M.D.   On: 07/26/2023 23:24    EKG: Independently reviewed.   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 Occurring in setting of holding eliquis ~1 month after initial dx and treatment of PE due to thrombocytopenia this past week. Thrombocytopenia resolved for the moment. EDP d/w Dr. Pamelia Hoit: resume eliquis Already got dose of eliquis 10mg  here in ED before I saw pt Will skip heparin gtt due to above and just leave on eliquis 2d echo Venous US BLE to look for DVT Daily CBC while here to monitor for re-development of thrombocytopenia since he just got another  round of chemo 3 days ago. Morphine PRN CP  HIV disease (HCC) Continue HAART  Primary seminoma of left testis (HCC) Pt on chemo with heme/onc including bleomycin. Most recently got chemo on 12/3 Tumor burden from CA is believed to be minimal (hoped to be in remission following surgery, has distant lymph node enlargement, may be due to CA or may be due to HIV). Watch for recurrent thrombocytopenia from chemo with daily CBC while here and now back on eliquis. Ordering his decadron for this AM (dose 3 of 3 that he typically takes for 3 days following chemo).      Advance Care Planning:   Code Status: Full Code  Consults: EDP d/w Dr. Pamelia Hoit  Family Communication: No family in room  Severity of Illness: The appropriate patient status for this patient is OBSERVATION. Observation status is judged to be reasonable and necessary in order to provide the required intensity of service to ensure the patient's safety. The patient's presenting symptoms, physical exam findings, and initial radiographic and laboratory data in the context of their medical condition is felt to place them at decreased risk for further clinical deterioration. Furthermore, it is anticipated  that the patient will be medically stable for discharge from the hospital within 2 midnights of admission.   Author: Hillary Bow., DO 07/27/2023 4:36 AM  For on call review [email or web address].

## 2023-07-27 NOTE — Assessment & Plan Note (Signed)
-   Continue HAART 

## 2023-07-27 NOTE — Progress Notes (Signed)
PROGRESS NOTE    Steve Stewart  QMV:[id number] DOB: 1991-05-15 DOA: 07/26/2023 PCP: Patient, No Pcp Per   Brief Narrative:  HPI: Steve Stewart is a 32 y.o. male with medical history significant of seminoma s/p orchiectomy, acute PE diagnosed Nov 1st 2024 with LLE DVT, HIV on HAART.  Pt currently on chemo for seminoma, just had bleomycin x3 days ago.  Pt had been on eliquis for DVT/PE but this was put on hold this past week due to severe thrombocytopenia with platelets in the 20s.  Pt had complete resolution of PE on repeat CT scan x1 week ago.   Pt in to ED: earlier today began having acute onset of pain behind L knee and near R shoulder blade.  Pain is pleuritic, similar to prior PE.  Associated SOB.  No melena nor hematochezia.   Work up in ED demonstrates acute PE with RHS on CT scan.   Prior thrombocytopenia is improved with platelets currently 135.   Pt reports he also has recurrent pain behind his L knee, same location as DVT last month.    Assessment & Plan:   Principal Problem:   Acute pulmonary embolism with acute cor pulmonale (HCC) Active Problems:   Primary seminoma of left testis (HCC)   HIV disease (HCC)   Antineoplastic chemotherapy induced pancytopenia (HCC)   Pulmonary embolism (HCC)  Acute submassive PE/extensive left lower extremity DVT/pulmonary infarct: Patient is still symptomatic with shortness of breath and pleuritic chest pain.  CTA chest showed right heart strain, Doppler lower extremity confirmed left lower extremity extensive DVT, worsened from previously.  Started on Eliquis after consultation with oncology.  Echo pending.  Will order incentive spirometry.  Observe overnight since submassive PE is associated with higher morbility and mortality.  Pancytopenia: Likely secondary to chemo.  Monitor.  HIV disease (HCC) Continue HAART   Primary seminoma of left testis (HCC) Pt on chemo with heme/onc including bleomycin. Most recently got  chemo on 12/3.  Oncology following.  DVT prophylaxis: Eliquis   Code Status: Full Code  Family Communication:  None present at bedside.  Plan of care discussed with patient in length and he/she verbalized understanding and agreed with it.  Status is: Inpatient Remains inpatient appropriate because: Needs extended observation due to submassive PE.   Estimated body mass index is 35.7 kg/m as calculated from the following:   Height as of 07/24/23: 5\' 11"  (1.803 m).   Weight as of this encounter: 116.1 kg.    Nutritional Assessment: Body mass index is 35.7 kg/m.Marland Kitchen Seen by dietician.  I agree with the assessment and plan as outlined below: Nutrition Status:        . Skin Assessment: I have examined the patient's skin and I agree with the wound assessment as performed by the wound care RN as outlined below:    Consultants:  Oncology  Procedures:  None  Antimicrobials:  Anti-infectives (From admission, onward)    Start     Dose/Rate Route Frequency Ordered Stop   07/27/23 1000  bictegravir-emtricitabine-tenofovir AF (BIKTARVY) 50-200-25 MG per tablet 1 tablet        1 tablet Oral Daily 07/27/23 0425           Subjective: Seen and examined.  Complains of right-sided pleuritic chest pain and shortness of breath.  However he is not hypoxic.  Objective: Vitals:   07/27/23 0315 07/27/23 0330 07/27/23 0531 07/27/23 1053  BP: 127/77 122/72 119/78 107/65  Pulse: 91 90 92 95  Resp: Marland Kitchen)  30 (!) 38 20 20  Temp:   98.7 F (37.1 C) 98.6 F (37 C)  TempSrc:   Oral Oral  SpO2: 98% 98% 98% 98%  Weight:       No intake or output data in the 24 hours ending 07/27/23 1159 Filed Weights   07/26/23 2241  Weight: 116.1 kg    Examination:  General exam: Appears calm and comfortable  Respiratory system: Diminished breath sounds due to poor inspiratory effort. Cardiovascular system: S1 & S2 heard, RRR. No JVD, murmurs, rubs, gallops or clicks. No pedal edema. Gastrointestinal  system: Abdomen is nondistended, soft and nontender. No organomegaly or masses felt. Normal bowel sounds heard. Central nervous system: Alert and oriented. No focal neurological deficits. Extremities: Symmetric 5 x 5 power. Skin: No rashes, lesions or ulcers Psychiatry: Judgement and insight appear normal. Mood & affect appropriate.    Data Reviewed: I have personally reviewed following labs and imaging studies  CBC: Recent Labs  Lab 07/24/23 0759 07/26/23 2253 07/27/23 0532  WBC 1.1* 2.2* 2.4*  NEUTROABS  --  0.5*  --   HGB 11.3* 11.9* 11.4*  HCT 33.0* 35.6* 33.6*  MCV 84.0 84.8 84.2  PLT 47* 135* 140*   Basic Metabolic Panel: Recent Labs  Lab 07/24/23 0759 07/26/23 2253 07/27/23 0532  NA 138 139 135  K 3.6 3.8 4.3  CL 105 105 104  CO2 27 25 22   GLUCOSE 111* 83 107*  BUN 13 18 15   CREATININE 0.89 0.93 0.94  CALCIUM 8.5* 8.8* 8.8*   GFR: Estimated Creatinine Clearance: 143.5 mL/min (by C-G formula based on SCr of 0.94 mg/dL). Liver Function Tests: Recent Labs  Lab 07/24/23 0759  AST 13*  ALT 28  ALKPHOS 53  BILITOT 0.3  PROT 6.1*  ALBUMIN 3.6   No results for input(s): "LIPASE", "AMYLASE" in the last 168 hours. No results for input(s): "AMMONIA" in the last 168 hours. Coagulation Profile: No results for input(s): "INR", "PROTIME" in the last 168 hours. Cardiac Enzymes: No results for input(s): "CKTOTAL", "CKMB", "CKMBINDEX", "TROPONINI" in the last 168 hours. BNP (last 3 results) No results for input(s): "PROBNP" in the last 8760 hours. HbA1C: No results for input(s): "HGBA1C" in the last 72 hours. CBG: No results for input(s): "GLUCAP" in the last 168 hours. Lipid Profile: No results for input(s): "CHOL", "HDL", "LDLCALC", "TRIG", "CHOLHDL", "LDLDIRECT" in the last 72 hours. Thyroid Function Tests: No results for input(s): "TSH", "T4TOTAL", "FREET4", "T3FREE", "THYROIDAB" in the last 72 hours. Anemia Panel: No results for input(s): "VITAMINB12",  "FOLATE", "FERRITIN", "TIBC", "IRON", "RETICCTPCT" in the last 72 hours. Sepsis Labs: No results for input(s): "PROCALCITON", "LATICACIDVEN" in the last 168 hours.  No results found for this or any previous visit (from the past 240 hour(s)).   Radiology Studies: VAS [place] LOWER EXTREMITY VENOUS (DVT)  Result Date: 07/27/2023  Lower Venous DVT Study Patient Name:  ROMELO MCILHENNY  Date of Exam:   07/27/2023 Medical Rec #: [id number]             Accession #:    [id number] Date of Birth: Sep 07, 1990             Patient Gender: M Patient Age:   1 years Exam Location:  [facility] Procedure:      VAS [place] LOWER EXTREMITY VENOUS (DVT) Referring Phys: Lyda Perone --------------------------------------------------------------------------------  Indications: Pulmonary embolism.  Risk Factors: Confirmed PE DVT. Anticoagulation: Eliquis. Comparison Study: 06/21/2023 - RIGHT:                   -  No evidence of common femoral vein obstruction.                    LEFT:                   - Findings consistent with acute deep vein thrombosis                   involving the left                   popliteal vein, left posterior tibial veins, and left peroneal                   veins.                   - No cystic structure found in the popliteal fossa. Performing Technologist: Chanda Busing RVT  Examination Guidelines: A complete evaluation includes B-mode imaging, spectral Doppler, color Doppler, and power Doppler as needed of all accessible portions of each vessel. Bilateral testing is considered an integral part of a complete examination. Limited examinations for reoccurring indications may be performed as noted. The reflux portion of the exam is performed with the patient in reverse Trendelenburg.  +---------+---------------+---------+-----------+----------+--------------+ RIGHT    CompressibilityPhasicitySpontaneityPropertiesThrombus Aging  +---------+---------------+---------+-----------+----------+--------------+ CFV      Full           Yes      Yes                                 +---------+---------------+---------+-----------+----------+--------------+ SFJ      Full                                                        +---------+---------------+---------+-----------+----------+--------------+ FV Prox  Full                                                        +---------+---------------+---------+-----------+----------+--------------+ FV Mid   Full                                                        +---------+---------------+---------+-----------+----------+--------------+ FV DistalFull                                                        +---------+---------------+---------+-----------+----------+--------------+ PFV      Full                                                        +---------+---------------+---------+-----------+----------+--------------+ POP      Full  Yes      Yes                                 +---------+---------------+---------+-----------+----------+--------------+ PTV      Full                                                        +---------+---------------+---------+-----------+----------+--------------+ PERO     Full                                                        +---------+---------------+---------+-----------+----------+--------------+   +---------+---------------+---------+-----------+----------+--------------+ LEFT     CompressibilityPhasicitySpontaneityPropertiesThrombus Aging +---------+---------------+---------+-----------+----------+--------------+ CFV      Full           Yes      Yes                                 +---------+---------------+---------+-----------+----------+--------------+ SFJ      Full                                                         +---------+---------------+---------+-----------+----------+--------------+ FV Prox  Full                                                        +---------+---------------+---------+-----------+----------+--------------+ FV Mid   Partial        Yes      Yes                  Acute          +---------+---------------+---------+-----------+----------+--------------+ FV DistalNone           No       No                   Acute          +---------+---------------+---------+-----------+----------+--------------+ PFV      Full                                                        +---------+---------------+---------+-----------+----------+--------------+ POP      None           No       No                   Acute          +---------+---------------+---------+-----------+----------+--------------+ PTV      None  Acute          +---------+---------------+---------+-----------+----------+--------------+ PERO     None                                         Acute          +---------+---------------+---------+-----------+----------+--------------+ Soleal   None                                         Acute          +---------+---------------+---------+-----------+----------+--------------+ Gastroc  None                                         Acute          +---------+---------------+---------+-----------+----------+--------------+ SSV      Full                                                        +---------+---------------+---------+-----------+----------+--------------+    Summary: RIGHT: - There is no evidence of deep vein thrombosis in the lower extremity.  - No cystic structure found in the popliteal fossa.  LEFT: - Findings consistent with acute deep vein thrombosis involving the left femoral vein, left popliteal vein, left posterior tibial veins, and left peroneal veins. Findings consistent with acute  intramuscular thrombosis involving the left gastrocnemius veins, and left soleal veins. - No cystic structure found in the popliteal fossa.  *See table(s) above for measurements and observations.    Preliminary    CT Angio Chest PE W and/or Wo Contrast  Result Date: 07/27/2023 CLINICAL DATA:  High probability for PE. EXAM: CT ANGIOGRAPHY CHEST WITH CONTRAST TECHNIQUE: Multidetector CT imaging of the chest was performed using the standard protocol during bolus administration of intravenous contrast. Multiplanar CT image reconstructions and MIPs were obtained to evaluate the vascular anatomy. RADIATION DOSE REDUCTION: This exam was performed according to the departmental dose-optimization program which includes automated exposure control, adjustment of the mA and/or kV according to patient size and/or use of iterative reconstruction technique. CONTRAST:  80mL OMNIPAQUE IOHEXOL 350 MG/ML SOLN COMPARISON:  CT angiogram chest 07/19/2028. FINDINGS: Cardiovascular: There are pulmonary emboli within lobar, segmental and subsegmental branches of the right lower lobe there also pulmonary emboli within subsegmental branches of the left upper lobe. Aorta is normal in size. Heart is normal in size. There is no pericardial effusion. Right chest port catheter tip ends in the distal SVC. Mediastinum/Nodes: No enlarged mediastinal, hilar, or axillary lymph nodes. Thyroid gland, trachea, and esophagus demonstrate no significant findings. Lungs/Pleura: Peripheral patchy ground-glass opacities in the right lower lobe are favored as pulmonary infarcts. There is minimal atelectasis in the right middle lobe. No pleural effusion or pneumothorax. Upper Abdomen: No acute abnormality. Musculoskeletal: No chest wall abnormality. No acute or significant osseous findings. Review of the MIP images confirms the above findings. IMPRESSION: 1. Bilateral pulmonary emboli, right greater than left. Positive for acute PE with CTevidence of right  heart strain (RV/LV Ratio = 1.3) consistent with at least submassive (intermediate risk) PE. The presence of  right heart strain has been associated with an increased risk of morbidity and mortality. 2. Peripheral patchy ground-glass opacities in the right lower lobe are favored as pulmonary infarcts. These results were called by telephone at the time of interpretation on 07/27/2023 at 12:39 am to provider [facility] , who verbally acknowledged these results. Electronically Signed   By: Darliss Cheney M.D.   On: 07/27/2023 00:41   DG Chest 2 View  Result Date: 07/26/2023 CLINICAL DATA:  Shortness of breath. History of DVT and pulmonary embolus. Off of Eliquis for couple of weeks. EXAM: CHEST - 2 VIEW COMPARISON:  07/20/2023 FINDINGS: Power port type central venous catheter with tip over the cavoatrial junction region. No pneumothorax. Shallow inspiration. Linear atelectasis or infiltration seen in the right lung base. No pleural effusions. No pneumothorax. Mediastinal contours appear intact. Heart size and pulmonary vascularity are normal. IMPRESSION: Shallow inspiration with linear atelectasis or infiltration in the right lung base. Electronically Signed   By: Burman Nieves M.D.   On: 07/26/2023 23:24    Scheduled Meds:  apixaban  10 mg Oral BID   Followed by   Melene Muller ON 08/03/2023] apixaban  5 mg Oral BID   bictegravir-emtricitabine-tenofovir AF  1 tablet Oral Daily   famotidine  20 mg Oral BID   traZODone  50 mg Oral QHS   Continuous Infusions:   LOS: 0 days   Hughie Closs, MD [facility]  07/27/2023, 11:59 AM   *Please note that this is a verbal dictation therefore any spelling or grammatical errors are due to the "Dragon [employer] One" system interpretation.  Please page via Amion and do not message via secure chat for urgent patient care matters. Secure chat can be used for non urgent patient care matters.  How to contact the [facility] Attending or Consulting provider 7A - 7P or  covering provider during after hours 7P -7A, for this patient?  Check the care team in [facility] and look for a) attending/consulting [facility] provider listed and b) the [facility] team listed. Page or secure chat 7A-7P. Log into [email or web address] and use [facility] universal password to access. If you do not have the password, please contact the hospital operator. Locate the [facility] provider you are looking for under [facility] and page to a number that you can be directly reached. If you still have difficulty reaching the provider, please page the [facility] (Director on Call) for the Hospitalists listed on amion for assistance.

## 2023-07-27 NOTE — Consult Note (Addendum)
[facility] ADMISSION NOTE  Patient Care Team: Patient, [name] - General (General Practice)   ASSESSMENT & PLAN:  32 y.o. male with diagnosis of left seminoma s/p orchiectomy presented with acute PE.   Diagnosis: pT2 [id number] normal TM. 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  MRI showed benign adrenal nodule.  Treatment: Status post cycle 1 BEP  Acute PE Agree with apixaban 10 mg twice daily for 7 days, followed by 5 mg twice daily Monitor for improvement over the weekend  Acute respiratory insufficiency Due to PE as above For possible chemotherapy for 1 week  Pancytopenia Mild, secondary to recent chemotherapy as expected Expect recovery over the next week  Stage IIA seminoma Completed cycle 1 of BEP will postpone cycle two 2-week of 12/16  Code Status Full code  Discharge planning Monitor for improvement on anticoagulation.  If continue to improve over the weekend, may continue apixaban at FULL dose for total of 1 week and then drop to 5 mg twice daily.  Patient has follow-up requested already in the clinic.  We will postpone chemotherapy for 1 week. Appreciate internal medicine management  All questions were answered.  Thank you for the consult   [name], MD 07/27/2023 8:14 AM   CHIEF COMPLAINTS/PURPOSE OF ADMISSION Acute PE  HISTORY OF PRESENTING ILLNESS:  Steve Stewart 32 y.o. male is admitted for acute PE.  Patient presented with sudden onset of chest pain, shortness of breath.  He described as difficulty breathing, similar to last episode when he woke up yesterday.  He also felt a cramp in the left leg.  He feels that his leg was swollen like before.  On admission, CT showed bilateral PE right greater than left with right heart strain.  Summary of oncologic history as follows: Oncology History  Primary seminoma of left testis (HCC)  04/28/2023 Initial Diagnosis   Primary seminoma of left testis  [facility]) Presented to ED for worsening testicular pain and presented with 2 months history of testicular mass.   04/28/2023 Imaging   [place] 1. Positive for abnormally enlarged and heterogeneous but vascular Left Testis highly suspicious for Testicular Neoplasm. Recommend Urology consultation. 2. Negative for testicular torsion.  Right testis appears normal.   04/28/2023 Imaging   CT CAP 1. Partially visualized thickening of the skin of the left scrotum. 2. Prominent bilateral axillary lymph nodes, right greater than left. Suspect at least 2 lymph nodes with abnormal cortical thickening in the right axilla. 3. Shotty retroperitoneal, bilateral iliac chain lymph nodes measuring up to 1.1 cm in short axis. 4. Bilateral inguinal lymph nodes with borderline thickened left inguinal lymph nodes measuring up to 1.1 cm in short axis. 5. 1.2 cm right adrenal mass, indeterminate. Attention on follow-up is recommended. Alternatively evaluation with MRI may be considered. 6. Hazy attenuation within the anterior mediastinum without defined mass may represent residual thymus. Attention on follow-up is recommended. 7. Cholelithiasis. 8. L4-L5 bilateral pars articularis defect. 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 10. Further evaluation with axillary and inguinal ultrasound may be considered.   05/02/2023 Surgery   TESTICLE, LEFT, ORCHIECTOMY:  Seminoma, size 6.1 cm  Tumor limited to testis with rete testis and lymphovascular invasion  (pT2)  Germ cell neoplasia in situ is present  Spermatic cord and all margins of resection are negative for tumor     06/25/2023 Imaging   CT AP IMPRESSION: 1. Stable mild  periportal, bilateral inguinal, bilateral external iliac  and bilateral common iliac lymphadenopathy. 2. No new or progressive metastatic disease in the abdomen or pelvis. 3. Solid peripheral basilar 2.0 cm right lower lobe pulmonary nodule, new from 04/28/2023 CT, similar  to recent 06/22/2023 chest CT, favoring evolving pulmonary infarct related to recent diagnosis of acute pulmonary embolism. Additional patchy consolidation at the posterior right lung base seen on prior chest CT has resolved. 4. Cholelithiasis. 5. Stable bilateral adrenal nodules characterized as benign adenomas on recent MRI, requiring no further imaging follow-up.   07/09/2023 -  Chemotherapy   Patient is on Treatment Plan : TESTICULAR BEP q21d x 3 Cycles       MEDICAL HISTORY:  Past Medical History:  Diagnosis Date   HIV infection (HCC)    Medical history non-contributory    Testicular cancer (HCC)     SURGICAL HISTORY: Past Surgical History:  Procedure Laterality Date   IR IMAGING GUIDED PORT INSERTION  06/18/2023   NO PAST SURGERIES     ORCHIECTOMY Left 05/02/2023   Procedure: LEFT INGUINAL ORCHIECTOMY;  Surgeon: Rene Paci, MD;  Location: [facility];  Service: Urology;  Laterality: Left;    SOCIAL HISTORY: Social History   Socioeconomic History   Marital status: Single    Spouse name: Not on file   Number of children: Not on file   Years of education: Not on file   Highest education level: Not on file  Occupational History   Not on file  Tobacco Use   Smoking status: Former    Current packs/day: 0.00    Types: Cigarettes    Quit date: 06/18/2023    Years since quitting: 0.1   Smokeless tobacco: Never   Tobacco comments:    Under a half of a pack a day    Patient stated he stopped smoking on 06/18/23  Vaping Use   Vaping status: Never Used  Substance and Sexual Activity   Alcohol use: No   Drug use: No   Sexual activity: Yes  Other Topics Concern   Not on file  Social History Narrative   Not on file   Social Determinants of Health   Financial Resource Strain: Not on file  Food Insecurity: No Food Insecurity (07/27/2023)   Hunger Vital Sign    Worried About Running Out of Food in the Last Year: Never true    Ran Out of Food in the Last  Year: Never true  Transportation Needs: No Transportation Needs (07/27/2023)   PRAPARE - Administrator, Civil Service (Medical): No    Lack of Transportation (Non-Medical): No  Physical Activity: Not on file  Stress: Not on file  Social Connections: Unknown (01/02/2022)   Received from [facility]   Social Network    Social Network: Not on file  Intimate Partner Violence: Not At Risk (07/27/2023)   Humiliation, Afraid, Rape, and Kick questionnaire    Fear of Current or Ex-Partner: No    Emotionally Abused: No    Physically Abused: No    Sexually Abused: No    FAMILY HISTORY: No family history on file.  ALLERGIES:  has No Known Allergies.  MEDICATIONS:  Current Facility-Administered Medications  Medication Dose Route Frequency Provider Last Rate Last Admin   acetaminophen (TYLENOL) tablet 650 mg  650 mg Oral Q6H PRN Hillary Bow, DO       Or   acetaminophen (TYLENOL) suppository 650 mg  650 mg Rectal Q6H PRN Hillary Bow, DO  albuterol (PROVENTIL) (2.5 MG/3ML) 0.083% nebulizer solution 2.5 mg  2.5 mg Nebulization Q2H PRN Hillary Bow, DO       apixaban Everlene Balls) tablet 10 mg  10 mg Oral BID Hillary Bow, DO       Followed by   Melene Muller ON 08/03/2023] apixaban (ELIQUIS) tablet 5 mg  5 mg Oral BID Hillary Bow, DO       bictegravir-emtricitabine-tenofovir AF (BIKTARVY) 50-200-25 MG per tablet 1 tablet  1 tablet Oral Daily Julian Reil, Jared M, DO       dexamethasone (DECADRON) tablet 4 mg  4 mg Oral Daily Julian Reil, Jared M, DO       famotidine (PEPCID) tablet 20 mg  20 mg Oral BID Lyda Perone M, DO       magic mouthwash w/lidocaine  15 mL Oral Q3H PRN Hillary Bow, DO       morphine (PF) 2 MG/ML injection 2-4 mg  2-4 mg Intravenous Q4H PRN Hillary Bow, DO   4 mg at 07/27/23 0729   ondansetron (ZOFRAN) tablet 4 mg  4 mg Oral Q6H PRN Hillary Bow, DO       Or   ondansetron [facility]) injection 4 mg  4 mg Intravenous Q6H PRN Hillary Bow, DO       traZODone (DESYREL) tablet 50 mg  50 mg Oral QHS Hillary Bow, DO        REVIEW OF SYSTEMS:   Constitutional: Denies fevers Respiratory: Denies cough Positive for difficulty breathing and positive shortness of breath  Cardiovascular: Positive for chest pain Gastrointestinal:  Denies nausea, vomiting, bloody stool, abdominal pain GU: Denies any difficulty urinating, hematuria MSK: Back pain All other systems were reviewed with the patient and are negative.  PHYSICAL EXAMINATION: ECOG PERFORMANCE STATUS: 1 - Symptomatic but completely ambulatory  Vitals:   07/27/23 0330 07/27/23 0531  BP: 122/72 119/78  Pulse: 90 92  Resp: (!) 38 20  Temp:  98.7 F (37.1 C)  SpO2: 98% 98%   Filed Weights   07/26/23 2241  Weight: 256 lb (116.1 kg)    GENERAL:alert, no distress  SKIN: skin color normal. No jaundice EYES: normal, sclera clear LUNGS: clear to auscultation and normal breathing effort.  No wheeze or rales HEART: regular rate & rhythm and no murmurs ABDOMEN:abdomen soft, non-tender and normal bowel sounds Musculoskeletal:  no lower extremity edema NEURO: alert with fluent speech  LABORATORY DATA:  I have reviewed the data as listed Lab Results  Component Value Date   WBC 2.4 (L) 07/27/2023   HGB 11.4 (L) 07/27/2023   HCT 33.6 (L) 07/27/2023   MCV 84.2 07/27/2023   PLT 140 (L) 07/27/2023   Recent Labs    07/09/23 0725 07/17/23 0803 07/20/23 0655 07/24/23 0759 07/26/23 2253 07/27/23 0532  NA 137 135   < > 138 139 135  K 3.9 4.2   < > 3.6 3.8 4.3  CL 104 103   < > 105 105 104  CO2 26 27   < > 27 25 22   GLUCOSE 124* 105*   < > 111* 83 107*  BUN 19 17   < > 13 18 15   CREATININE 0.97 0.99   < > 0.89 0.93 0.94  CALCIUM 9.2 8.8*   < > 8.5* 8.8* 8.8*  GFRNONAA >60 >60   < > >60 >60 >60  PROT 7.2 6.5  --  6.1*  --   --   ALBUMIN 4.0 3.8  --  3.6  --   --   AST 16 12*  --  13*  --   --   ALT 27 30  --  28  --   --   ALKPHOS 55 50  --  53  --   --    BILITOT 0.5 0.5  --  0.3  --   --    < > = values in this interval not displayed.    RADIOGRAPHIC STUDIES: I have personally reviewed the radiological images as listed and agreed with the findings in the report. CT Angio Chest PE W and/or Wo Contrast  Result Date: 07/27/2023 CLINICAL DATA:  High probability for PE. EXAM: CT ANGIOGRAPHY CHEST WITH CONTRAST TECHNIQUE: Multidetector CT imaging of the chest was performed using the standard protocol during bolus administration of intravenous contrast. Multiplanar CT image reconstructions and MIPs were obtained to evaluate the vascular anatomy. RADIATION DOSE REDUCTION: This exam was performed according to the departmental dose-optimization program which includes automated exposure control, adjustment of the mA and/or kV according to patient size and/or use of iterative reconstruction technique. CONTRAST:  80mL OMNIPAQUE IOHEXOL 350 MG/ML SOLN COMPARISON:  CT angiogram chest 07/19/2028. FINDINGS: Cardiovascular: There are pulmonary emboli within lobar, segmental and subsegmental branches of the right lower lobe there also pulmonary emboli within subsegmental branches of the left upper lobe. Aorta is normal in size. Heart is normal in size. There is no pericardial effusion. Right chest port catheter tip ends in the distal SVC. Mediastinum/Nodes: No enlarged mediastinal, hilar, or axillary lymph nodes. Thyroid gland, trachea, and esophagus demonstrate no significant findings. Lungs/Pleura: Peripheral patchy ground-glass opacities in the right lower lobe are favored as pulmonary infarcts. There is minimal atelectasis in the right middle lobe. No pleural effusion or pneumothorax. Upper Abdomen: No acute abnormality. Musculoskeletal: No chest wall abnormality. No acute or significant osseous findings. Review of the MIP images confirms the above findings. IMPRESSION: 1. Bilateral pulmonary emboli, right greater than left. Positive for acute PE with CTevidence of right  heart strain (RV/LV Ratio = 1.3) consistent with at least submassive (intermediate risk) PE. The presence of right heart strain has been associated with an increased risk of morbidity and mortality. 2. Peripheral patchy ground-glass opacities in the right lower lobe are favored as pulmonary infarcts. These results were called by telephone at the time of interpretation on 07/27/2023 at 12:39 am to provider [facility] , who verbally acknowledged these results. Electronically Signed   By: Darliss Cheney M.D.   On: 07/27/2023 00:41   DG Chest 2 View  Result Date: 07/26/2023 CLINICAL DATA:  Shortness of breath. History of DVT and pulmonary embolus. Off of Eliquis for couple of weeks. EXAM: CHEST - 2 VIEW COMPARISON:  07/20/2023 FINDINGS: Power port type central venous catheter with tip over the cavoatrial junction region. No pneumothorax. Shallow inspiration. Linear atelectasis or infiltration seen in the right lung base. No pleural effusions. No pneumothorax. Mediastinal contours appear intact. Heart size and pulmonary vascularity are normal. IMPRESSION: Shallow inspiration with linear atelectasis or infiltration in the right lung base. Electronically Signed   By: Burman Nieves M.D.   On: 07/26/2023 23:24   CT Angio Chest PE W and/or Wo Contrast  Result Date: 07/20/2023 CLINICAL DATA:  Short of breath and chest pain beginning late last night. Patient stopped taking Eliquis on Tuesday. EXAM: CT ANGIOGRAPHY CHEST WITH CONTRAST TECHNIQUE: Multidetector CT imaging of the chest was performed using the standard protocol during bolus administration of intravenous contrast. Multiplanar CT  image reconstructions and MIPs were obtained to evaluate the vascular anatomy. RADIATION DOSE REDUCTION: This exam was performed according to the departmental dose-optimization program which includes automated exposure control, adjustment of the mA and/or kV according to patient size and/or use of iterative reconstruction  technique. CONTRAST:  75mL OMNIPAQUE IOHEXOL 350 MG/ML SOLN COMPARISON:  06/22/2023. FINDINGS: Cardiovascular: Pulmonary arteries are well opacified. No evidence of a pulmonary embolism. Previously noted right-sided pulmonary emboli have resolved. Heart is normal in size and configuration. No pericardial effusion. Great vessels are normal in caliber. No aortic dissection or atherosclerosis. Arch branch vessels are widely patent. Mediastinum/Nodes: No neck base, mediastinal or hilar masses. No enlarged lymph nodes. Trachea and esophagus are unremarkable. Lungs/Pleura: Mild ground-glass opacities in the lower lobes suspected to be atelectasis. More confluent type opacity noted in the lower lobes on the prior study resolved. Remainder of the lungs is clear. No pleural effusion or pneumothorax. Upper Abdomen: No acute findings. 1.9 cm gallstone, dependent aspect of a distended gallbladder. No gallbladder wall thickening or adjacent inflammation. Musculoskeletal: No chest wall abnormality. No acute or significant osseous findings. Review of the MIP images confirms the above findings. IMPRESSION: 1. No evidence of a pulmonary embolism. Previously noted right-sided pulmonary emboli have resolved. 2. No acute findings. 3. Cholelithiasis. 4. Mild ground-glass opacities in the lower lobes suspected to be atelectasis. Electronically Signed   By: Amie Portland M.D.   On: 07/20/2023 09:15   DG Chest 2 View  Result Date: 07/20/2023 CLINICAL DATA:  Chest pain EXAM: CHEST - 2 VIEW COMPARISON:  06/22/2023 FINDINGS: Porta catheter on the right with tip at the upper cavoatrial junction. Normal heart size and mediastinal contours. No acute infiltrate or edema. No effusion or pneumothorax. No acute osseous findings. IMPRESSION: No active cardiopulmonary disease. Electronically Signed   By: Tiburcio Pea M.D.   On: 07/20/2023 06:59

## 2023-07-27 NOTE — TOC Initial Note (Addendum)
Transition of Care [facility]) - Initial/Assessment Note    Patient Details  Name: Steve Stewart MRN: [id number] Date of Birth: July 09, 1991  Transition of [facility] CM/SW Contact:    Howell Rucks, RN Phone Number: 07/27/2023, 12:36 PM  Clinical Narrative:  Met with pt at  bedside to introduce role of TOC/NCM. 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 TOC Brief Assessment completed. No TOC needs identified at this time.                      Patient Goals and CMS Choice            Expected Discharge Plan and Services                                              Prior Living Arrangements/Services                       Activities of Daily Living   ADL Screening (condition at time of admission) Independently performs ADLs?: Yes (appropriate for developmental age) Is the patient deaf or have difficulty hearing?: No Does the patient have difficulty seeing, even when wearing glasses/contacts?: No Does the patient have difficulty concentrating, remembering, or making decisions?: No  Permission Sought/Granted                  Emotional Assessment              Admission diagnosis:  Acute pulmonary embolism with acute cor pulmonale (HCC) [I26.09] Other acute pulmonary embolism, unspecified whether acute cor pulmonale present (HCC) [I26.99] Pulmonary embolism (HCC) [I26.99] Patient Active Problem List   Diagnosis Date Noted   Acute pulmonary embolism with acute cor pulmonale (HCC) 07/27/2023   Antineoplastic chemotherapy induced pancytopenia (HCC) 07/27/2023   Pulmonary embolism (HCC) 07/27/2023   Nausea without vomiting 07/24/2023   Cholelithiasis 07/24/2023   Other pancytopenia (HCC) 07/23/2023    Oral ulcer 07/17/2023   At risk for side effect of medication 07/16/2023   HIV disease (HCC) 06/28/2023   Hyponatremia 06/25/2023   Acute pulmonary embolism (HCC) 06/22/2023   Left leg pain 06/21/2023   Primary seminoma of left testis (HCC) 05/10/2023   Right adrenal mass (HCC) 05/10/2023   PCP:  Patient, No Pcp Per Pharmacy:   [facility] [address] [address] Phone: [phone] Fax: [phone]     Social Determinants of Health (SDOH) Social History: SDOH Screenings   Food Insecurity: No Food Insecurity (07/27/2023)  Housing: Low Risk  (07/27/2023)  Transportation Needs: No Transportation Needs (07/27/2023)  Utilities: Not At Risk (07/27/2023)  Depression (PHQ2-9): Low Risk  (06/28/2023)  Social Connections: Unknown (01/02/2022)   Received from [facility]  Tobacco Use: Medium Risk (07/20/2023)   SDOH Interventions:     Readmission Risk Interventions    07/27/2023   12:36 PM  Readmission Risk Prevention Plan  Transportation Screening Complete  PCP or Specialist Appt within 3-5 Days Complete  HRI or Home Care Consult Complete  Social Work Consult for Recovery Care Planning/Counseling Complete  Palliative Care Screening Not Applicable  Medication  Review Oceanographer) Complete

## 2023-07-28 ENCOUNTER — Inpatient Hospital Stay (HOSPITAL_COMMUNITY): Payer: 59

## 2023-07-28 DIAGNOSIS — I2609 Other pulmonary embolism with acute cor pulmonale: Secondary | ICD-10-CM | POA: Diagnosis not present

## 2023-07-28 DIAGNOSIS — J9601 Acute respiratory failure with hypoxia: Secondary | ICD-10-CM

## 2023-07-28 DIAGNOSIS — I2729 Other secondary pulmonary hypertension: Secondary | ICD-10-CM | POA: Diagnosis not present

## 2023-07-28 LAB — ECHOCARDIOGRAM LIMITED: Weight: 4096 [oz_av]

## 2023-07-28 LAB — BRAIN NATRIURETIC PEPTIDE: B Natriuretic Peptide: 30.6 pg/mL (ref 0.0–100.0)

## 2023-07-28 LAB — MRSA NEXT GEN BY PCR, NASAL: MRSA by PCR Next Gen: NOT DETECTED

## 2023-07-28 LAB — PROCALCITONIN: Procalcitonin: 0.12 ng/mL

## 2023-07-28 LAB — GLUCOSE, CAPILLARY: Glucose-Capillary: 94 mg/dL (ref 70–99)

## 2023-07-28 LAB — TROPONIN I (HIGH SENSITIVITY): Troponin I (High Sensitivity): <2 ng/L (ref <18)

## 2023-07-28 LAB — LACTIC ACID, PLASMA: Lactic Acid, Venous: 1.3 mmol/L (ref 0.5–1.9)

## 2023-07-28 MED ORDER — OXYCODONE HCL ER 10 MG PO T12A
10.0000 mg | EXTENDED_RELEASE_TABLET | Freq: Two times a day (BID) | ORAL | Status: DC
  Administered 2023-07-28 – 2023-08-01 (×10): 10 mg via ORAL
  Filled 2023-07-28 (×10): qty 1

## 2023-07-28 MED ORDER — MORPHINE SULFATE (PF) 2 MG/ML IV SOLN
2.0000 mg | INTRAVENOUS | Status: DC | PRN
  Administered 2023-07-28 (×2): 2 mg via INTRAVENOUS
  Filled 2023-07-28 (×2): qty 1

## 2023-07-28 MED ORDER — SODIUM CHLORIDE (PF) 0.9 % IJ SOLN
INTRAMUSCULAR | Status: AC
  Filled 2023-07-28: qty 50

## 2023-07-28 MED ORDER — HEPARIN (PORCINE) 25000 UT/250ML-% IV SOLN
1800.0000 [IU]/h | INTRAVENOUS | Status: DC
  Administered 2023-07-28 – 2023-07-29 (×2): 1800 [IU]/h via INTRAVENOUS
  Filled 2023-07-28 (×2): qty 250

## 2023-07-28 MED ORDER — ALPRAZOLAM 0.25 MG PO TABS
0.2500 mg | ORAL_TABLET | Freq: Two times a day (BID) | ORAL | Status: DC | PRN
  Administered 2023-07-28 – 2023-08-02 (×7): 0.25 mg via ORAL
  Filled 2023-07-28 (×7): qty 1

## 2023-07-28 MED ORDER — DIPHENHYDRAMINE HCL 12.5 MG/5ML PO ELIX: 12.5000 mg | ORAL_SOLUTION | Freq: Four times a day (QID) | ORAL | Status: DC | PRN

## 2023-07-28 MED ORDER — HYDROMORPHONE 1 MG/ML IV SOLN
INTRAVENOUS | Status: DC
  Administered 2023-07-28: 30 mg via INTRAVENOUS
  Administered 2023-07-30: 1.2 mg via INTRAVENOUS
  Administered 2023-07-30: 30 mg via INTRAVENOUS
  Administered 2023-07-31: 2.1 mg via INTRAVENOUS
  Administered 2023-07-31: 0.6 mg via INTRAVENOUS
  Administered 2023-07-31: 0.9 mg via INTRAVENOUS
  Administered 2023-07-31: 1.8 mg via INTRAVENOUS
  Administered 2023-07-31: 1.4 mg via INTRAVENOUS
  Administered 2023-07-31: 0.9 mg via INTRAVENOUS
  Administered 2023-08-01 (×2): 0.6 mg via INTRAVENOUS
  Administered 2023-08-01 (×2): 0.9 mg via INTRAVENOUS
  Administered 2023-08-01: 1.2 mg via INTRAVENOUS
  Filled 2023-07-28 (×3): qty 30

## 2023-07-28 MED ORDER — SODIUM CHLORIDE 0.9% FLUSH: 9.0000 mL | INTRAVENOUS | Status: DC | PRN

## 2023-07-28 MED ORDER — ORAL CARE MOUTH RINSE: 15.0000 mL | OROMUCOSAL | Status: DC | PRN

## 2023-07-28 MED ORDER — DIPHENHYDRAMINE HCL 50 MG/ML IJ SOLN: 12.5000 mg | Freq: Four times a day (QID) | INTRAMUSCULAR | Status: DC | PRN

## 2023-07-28 MED ORDER — MORPHINE SULFATE (PF) 4 MG/ML IV SOLN: 4.0000 mg | Freq: Once | INTRAVENOUS | Status: DC

## 2023-07-28 MED ORDER — NALOXONE HCL 0.4 MG/ML IJ SOLN: 0.4000 mg | INTRAMUSCULAR | Status: DC | PRN

## 2023-07-28 MED ORDER — ONDANSETRON HCL 4 MG/2ML IJ SOLN: 4.0000 mg | Freq: Four times a day (QID) | INTRAMUSCULAR | Status: DC | PRN

## 2023-07-28 MED ORDER — HYDROMORPHONE HCL 1 MG/ML IJ SOLN
1.0000 mg | Freq: Once | INTRAMUSCULAR | Status: AC
  Administered 2023-07-28: 1 mg via INTRAVENOUS
  Filled 2023-07-28: qty 1

## 2023-07-28 MED ORDER — CHLORHEXIDINE GLUCONATE CLOTH 2 % EX PADS
6.0000 | MEDICATED_PAD | Freq: Every day | CUTANEOUS | Status: DC
  Administered 2023-07-28 – 2023-08-03 (×6): 6 via TOPICAL

## 2023-07-28 MED ORDER — ENSURE ENLIVE PO LIQD
237.0000 mL | Freq: Two times a day (BID) | ORAL | Status: DC
  Administered 2023-07-29 – 2023-07-31 (×4): 237 mL via ORAL

## 2023-07-28 MED ORDER — IOHEXOL 350 MG/ML SOLN
75.0000 mL | Freq: Once | INTRAVENOUS | Status: AC | PRN
  Administered 2023-07-28: 75 mL via INTRAVENOUS

## 2023-07-28 NOTE — Progress Notes (Addendum)
PROGRESS NOTE    Steve Stewart  UJW:[id number] DOB: [birth year] DOA: 07/26/2023 PCP: Patient, No Pcp Per   Brief Narrative:  HPI: Steve Stewart is a 32 y.o. male with medical history significant of seminoma s/p orchiectomy, acute PE diagnosed Nov 1st 2024 with LLE DVT, HIV on HAART.  Pt currently on chemo for seminoma, just had bleomycin x3 days ago.  Pt had been on eliquis for DVT/PE but this was put on hold this past week due to severe thrombocytopenia with platelets in the 20s.  Pt had complete resolution of PE on repeat CT scan x1 week ago.   Pt in to ED: earlier today began having acute onset of pain behind L knee and near R shoulder blade.  Pain is pleuritic, similar to prior PE.  Associated SOB.  No melena nor hematochezia.   Work up in ED demonstrates acute PE with RHS on CT scan.   Prior thrombocytopenia is improved with platelets currently 135.   Pt reports he also has recurrent pain behind his L knee, same location as DVT last month.    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  Started on Eliquis after consultation with oncology.  Echo also showed RV strain.  Patient did have some chest pain yesterday and mild shortness of breath but he was not hypoxic.  He is complaining of more shortness of breath and more pain today and now he is hypoxic requiring 2 L of oxygen.  Although otherwise hemodynamically stable but this is concerning for worsening PE and I wonder if he is going to be a candidate for  thrombolytics.  I consulted PCCM, spoke to Dr. Marchelle Gearing, he recommended transferring to stepdown, he is going to order lactic acid and troponin.  He recommended consulting with IR for possible catheter directed thrombolytics.  I spoke to Dr. Tiburcio Bash of IR and he reviewed CTA personally and opined that he did not agree with the initial reading by radiologist about submassive PE, per him, he can only see subsegmental PE which should not cause patient's symptoms.  He recommended repeating stat CTA to see if his PE has now worsened.  Informed PCCM about these recommendations.  CTA ordered stat.  Will follow-up on that.  I will also check procalcitonin.  Encouraged incentive spirometry.  Started on oxycodone 10 mg long-acting every 12 hours, pain was still not controlled so started on morphine 2 mg every 3 hours as needed.  Pancytopenia: Likely secondary to chemo.  Monitor.  Improving.  HIV disease (HCC) Continue HAART   Primary seminoma of left testis (HCC) Pt on chemo with heme/onc including bleomycin. Most recently got chemo on 12/3.  Oncology following.  DVT prophylaxis: Eliquis   Code Status: Full Code  Family Communication:  None present at bedside.  Plan of care discussed with patient in length and he/she verbalized understanding and agreed with it.  Status is: Inpatient Remains inpatient appropriate because: Patient sicker today, may be a candidate for thrombolytics.  Estimated body mass index is 35.7 kg/m as calculated from the following:   Height as of  07/24/23: 5\' 11"  (1.803 m).   Weight as of this encounter: 116.1 kg.    Nutritional Assessment: Body mass index is 35.7 kg/m.Marland Kitchen Seen by dietician.  I agree with the assessment and plan as outlined below: Nutrition Status:        . Skin Assessment: I have examined the patient's skin and I agree with the wound assessment as performed by the wound care RN as outlined below:    Consultants:  Oncology  Procedures:   None  Antimicrobials:  Anti-infectives (From admission, onward)    Start     Dose/Rate Route Frequency Ordered Stop   07/27/23 1000  bictegravir-emtricitabine-tenofovir AF (BIKTARVY) 50-200-25 MG per tablet 1 tablet        1 tablet Oral Daily 07/27/23 0425           Subjective: Patient seen and examined earlier today, he was complaining of worsened pleuritic chest pain with shortness of breath.  Objective: Vitals:   07/27/23 1949 07/27/23 2256 07/28/23 0448 07/28/23 1154  BP: 125/81  (!) 135/92 132/76  Pulse: 87  90 95  Resp: 17 19 17 20   Temp: 98 F (36.7 C)  98.6 F (37 C) 98.4 F (36.9 C)  TempSrc: Oral  Oral Oral  SpO2: 99%  100% 99%  Weight:       No intake or output data in the 24 hours ending 07/28/23 1300 Filed Weights   07/26/23 2241  Weight: 116.1 kg    Examination:  General exam: Appears uncomfortable due to pain Respiratory system: Diminished breath sounds due to poor inspiratory effort Cardiovascular system: S1 & S2 heard, RRR. No JVD, murmurs, rubs, gallops or clicks. No pedal edema.  However his left lower extremity is slightly tight compared to the right lower extremity. Gastrointestinal system: Abdomen is nondistended, soft and nontender. No organomegaly or masses felt. Normal bowel sounds heard. Central nervous system: Alert and oriented. No focal neurological deficits. Extremities: Symmetric 5 x 5 power. Skin: No rashes, lesions or ulcers.  Psychiatry: Judgement and insight appear normal. Mood & affect appropriate.   Data Reviewed: I have personally reviewed following labs and imaging studies  CBC: Recent Labs  Lab 07/24/23 0759 07/26/23 2253 07/27/23 0532  WBC 1.1* 2.2* 2.4*  NEUTROABS  --  0.5*  --   HGB 11.3* 11.9* 11.4*  HCT 33.0* 35.6* 33.6*  MCV 84.0 84.8 84.2  PLT 47* 135* 140*   Basic Metabolic Panel: Recent Labs  Lab 07/24/23 0759 07/26/23 2253 07/27/23 0532  NA 138 139 135  K 3.6 3.8 4.3  CL 105 105 104  CO2 27 25  22   GLUCOSE 111* 83 107*  BUN 13 18 15   CREATININE 0.89 0.93 0.94  CALCIUM 8.5* 8.8* 8.8*   GFR: Estimated Creatinine Clearance: 143.5 mL/min (by C-G formula based on SCr of 0.94 mg/dL). Liver Function Tests: Recent Labs  Lab 07/24/23 0759  AST 13*  ALT 28  ALKPHOS 53  BILITOT 0.3  PROT 6.1*  ALBUMIN 3.6   No results for input(s): "LIPASE", "AMYLASE" in the last 168 hours. No results for input(s): "AMMONIA" in the last 168 hours. Coagulation Profile: No results for input(s): "INR", "PROTIME" in the last 168 hours. Cardiac Enzymes: No results for input(s): "CKTOTAL", "CKMB", "CKMBINDEX", "TROPONINI" in the last 168 hours. BNP (last 3 results) No results for input(s): "PROBNP" in the last 8760 hours. HbA1C: No results for input(s): "HGBA1C" in the last 72 hours. CBG: Recent Labs  Lab 07/28/23 1157  GLUCAP 94  Lipid Profile: No results for input(s): "CHOL", "HDL", "LDLCALC", "TRIG", "CHOLHDL", "LDLDIRECT" in the last 72 hours. Thyroid Function Tests: No results for input(s): "TSH", "T4TOTAL", "FREET4", "T3FREE", "THYROIDAB" in the last 72 hours. Anemia Panel: No results for input(s): "VITAMINB12", "FOLATE", "FERRITIN", "TIBC", "IRON", "RETICCTPCT" in the last 72 hours. Sepsis Labs: No results for input(s): "PROCALCITON", "LATICACIDVEN" in the last 168 hours.  No results found for this or any previous visit (from the past 240 hour(s)).   Radiology Studies: VAS [place] LOWER EXTREMITY VENOUS (DVT)  Result Date: 07/27/2023  Lower Venous DVT Study Patient Name:  KOBEY LICHTENFELS  Date of Exam:   07/27/2023 Medical Rec #: [id number]             Accession #:    [id number] Date of Birth: 02/05/1991             Patient Gender: M Patient Age:   65 years Exam Location:  [facility] Procedure:      VAS [place] LOWER EXTREMITY VENOUS (DVT) Referring Phys: Lyda Perone --------------------------------------------------------------------------------  Indications: Pulmonary embolism.   Risk Factors: Confirmed PE DVT. Anticoagulation: Eliquis. Comparison Study: 06/21/2023 - RIGHT:                   - No evidence of common femoral vein obstruction.                    LEFT:                   - Findings consistent with acute deep vein thrombosis                   involving the left                   popliteal vein, left posterior tibial veins, and left peroneal                   veins.                   - No cystic structure found in the popliteal fossa. Performing Technologist: Chanda Busing RVT  Examination Guidelines: A complete evaluation includes B-mode imaging, spectral Doppler, color Doppler, and power Doppler as needed of all accessible portions of each vessel. Bilateral testing is considered an integral part of a complete examination. Limited examinations for reoccurring indications may be performed as noted. The reflux portion of the exam is performed with the patient in reverse Trendelenburg.  +---------+---------------+---------+-----------+----------+--------------+ RIGHT    CompressibilityPhasicitySpontaneityPropertiesThrombus Aging +---------+---------------+---------+-----------+----------+--------------+ CFV      Full           Yes      Yes                                 +---------+---------------+---------+-----------+----------+--------------+ SFJ      Full                                                        +---------+---------------+---------+-----------+----------+--------------+ FV Prox  Full                                                        +---------+---------------+---------+-----------+----------+--------------+  FV Mid   Full                                                        +---------+---------------+---------+-----------+----------+--------------+ FV DistalFull                                                        +---------+---------------+---------+-----------+----------+--------------+ PFV      Full                                                         +---------+---------------+---------+-----------+----------+--------------+ POP      Full           Yes      Yes                                 +---------+---------------+---------+-----------+----------+--------------+ PTV      Full                                                        +---------+---------------+---------+-----------+----------+--------------+ PERO     Full                                                        +---------+---------------+---------+-----------+----------+--------------+   +---------+---------------+---------+-----------+----------+--------------+ LEFT     CompressibilityPhasicitySpontaneityPropertiesThrombus Aging +---------+---------------+---------+-----------+----------+--------------+ CFV      Full           Yes      Yes                                 +---------+---------------+---------+-----------+----------+--------------+ SFJ      Full                                                        +---------+---------------+---------+-----------+----------+--------------+ FV Prox  Full                                                        +---------+---------------+---------+-----------+----------+--------------+ FV Mid   Partial        Yes      Yes                  Acute          +---------+---------------+---------+-----------+----------+--------------+  FV DistalNone           No       No                   Acute          +---------+---------------+---------+-----------+----------+--------------+ PFV      Full                                                        +---------+---------------+---------+-----------+----------+--------------+ POP      None           No       No                   Acute          +---------+---------------+---------+-----------+----------+--------------+ PTV      None                                         Acute           +---------+---------------+---------+-----------+----------+--------------+ PERO     None                                         Acute          +---------+---------------+---------+-----------+----------+--------------+ Soleal   None                                         Acute          +---------+---------------+---------+-----------+----------+--------------+ Gastroc  None                                         Acute          +---------+---------------+---------+-----------+----------+--------------+ SSV      Full                                                        +---------+---------------+---------+-----------+----------+--------------+     Summary: RIGHT: - There is no evidence of deep vein thrombosis in the lower extremity.  - No cystic structure found in the popliteal fossa.  LEFT: - Findings consistent with acute deep vein thrombosis involving the left femoral vein, left popliteal vein, left posterior tibial veins, and left peroneal veins. Findings consistent with acute intramuscular thrombosis involving the left gastrocnemius veins, and left soleal veins. - No cystic structure found in the popliteal fossa.  *See table(s) above for measurements and observations. Electronically signed by Carolynn Sayers on 07/27/2023 at 2:16:43 PM.    Final    ECHOCARDIOGRAM LIMITED  Result Date: 07/27/2023    ECHOCARDIOGRAM LIMITED REPORT   Patient Name:   NAHYAN ALTHOUSE Date of Exam: 07/27/2023 Medical Rec #:  [id number]            Height:  71.0 in Accession #:    [id number]           Weight:       256.0 lb Date of Birth:  [birth year]            BSA:          2.342 m Patient Age:    32 years             BP:           119/78 mmHg Patient Gender: M                    HR:           87 bpm. Exam Location:  Inpatient Procedure: Limited Echo, Cardiac Doppler and Color Doppler Indications:    I26.02 Pulmonary embolus  History:        Patient has prior history of Echocardiogram  examinations, most                 recent 06/23/2023. Signs/Symptoms:Shortness of Breath and                 Dyspnea. Pulmonary embolus. Cancer. Cor Pulmonale.  Sonographer:    Sheralyn Boatman RDCS Referring Phys: [phone] JARED M GARDNER  Sonographer Comments: Technically difficult study due to poor echo windows. Patient in pain, could not turn. IMPRESSIONS  1. Left ventricular ejection fraction, by estimation, is 60 to 65%. The left ventricle has normal function. The left ventricle has no regional wall motion abnormalities.  2. Right ventricular systolic function is normal. The right ventricular size is moderately enlarged. Tricuspid regurgitation signal is inadequate for assessing PA pressure.  3. No evidence of mitral valve regurgitation.  4. The inferior vena cava is normal in size with greater than 50% respiratory variability, suggesting right atrial pressure of 3 mmHg. Conclusion(s)/Recommendation(s): RV does look more dilated compared to prior study; c/f RV strain. FINDINGS  Left Ventricle: Left ventricular ejection fraction, by estimation, is 60 to 65%. The left ventricle has normal function. The left ventricle has no regional wall motion abnormalities. The left ventricular internal cavity size was normal in size. Right Ventricle: The right ventricular size is moderately enlarged. Right ventricular systolic function is normal. Tricuspid regurgitation signal is inadequate for assessing PA pressure. Right Atrium: Right atrial size was normal in size. Tricuspid Valve: Tricuspid valve regurgitation is not demonstrated. Venous: The inferior vena cava is normal in size with greater than 50% respiratory variability, suggesting right atrial pressure of 3 mmHg. Additional Comments: Spectral Doppler performed. Color Doppler performed.  LEFT VENTRICLE PLAX 2D LVIDd:         4.30 cm LVIDs:         2.90 cm LV PW:         1.00 cm LV IVS:        1.10 cm LVOT diam:     2.40 cm LV SV:         66 LV SV Index:   28 LVOT Area:     4.52  cm  LV Volumes (MOD) LV vol d, MOD A2C: 68.2 ml LV vol d, MOD A4C: 69.0 ml LV vol s, MOD A2C: 21.6 ml LV vol s, MOD A4C: 21.8 ml LV SV MOD A2C:     46.6 ml LV SV MOD A4C:     69.0 ml LV SV MOD BP:      47.7 ml RIGHT VENTRICLE  IVC RV S prime:     10.90 cm/s  IVC diam: 1.90 cm TAPSE (M-mode): 2.0 cm RIGHT ATRIUM           Index RA Area:     14.90 cm RA Volume:   40.20 ml  17.17 ml/m  AORTIC VALVE LVOT Vmax:   102.00 cm/s LVOT Vmean:  64.600 cm/s LVOT VTI:    0.145 m  AORTA Ao Asc diam: 3.30 cm MITRAL VALVE MV Area (PHT): 4.60 cm    SHUNTS MV Decel Time: 165 msec    Systemic VTI:  0.14 m MV E velocity: 82.40 cm/s  Systemic Diam: 2.40 cm MV A velocity: 78.40 cm/s MV E/A ratio:  1.05 Halford Decamp signed by Carolan Clines Signature Date/Time: 07/27/2023/12:17:48 PM    Final    CT Angio Chest PE W and/or Wo Contrast  Result Date: 07/27/2023 CLINICAL DATA:  High probability for PE. EXAM: CT ANGIOGRAPHY CHEST WITH CONTRAST TECHNIQUE: Multidetector CT imaging of the chest was performed using the standard protocol during bolus administration of intravenous contrast. Multiplanar CT image reconstructions and MIPs were obtained to evaluate the vascular anatomy. RADIATION DOSE REDUCTION: This exam was performed according to the departmental dose-optimization program which includes automated exposure control, adjustment of the mA and/or kV according to patient size and/or use of iterative reconstruction technique. CONTRAST:  80mL OMNIPAQUE IOHEXOL 350 MG/ML SOLN COMPARISON:  CT angiogram chest 07/19/2028. FINDINGS: Cardiovascular: There are pulmonary emboli within lobar, segmental and subsegmental branches of the right lower lobe there also pulmonary emboli within subsegmental branches of the left upper lobe. Aorta is normal in size. Heart is normal in size. There is no pericardial effusion. Right chest port catheter tip ends in the distal SVC. Mediastinum/Nodes: No enlarged mediastinal, hilar, or  axillary lymph nodes. Thyroid gland, trachea, and esophagus demonstrate no significant findings. Lungs/Pleura: Peripheral patchy ground-glass opacities in the right lower lobe are favored as pulmonary infarcts. There is minimal atelectasis in the right middle lobe. No pleural effusion or pneumothorax. Upper Abdomen: No acute abnormality. Musculoskeletal: No chest wall abnormality. No acute or significant osseous findings. Review of the MIP images confirms the above findings. IMPRESSION: 1. Bilateral pulmonary emboli, right greater than left. Positive for acute PE with CTevidence of right heart strain (RV/LV Ratio = 1.3) consistent with at least submassive (intermediate risk) PE. The presence of right heart strain has been associated with an increased risk of morbidity and mortality. 2. Peripheral patchy ground-glass opacities in the right lower lobe are favored as pulmonary infarcts. These results were called by telephone at the time of interpretation on 07/27/2023 at 12:39 am to provider [facility] , who verbally acknowledged these results. Electronically Signed   By: Darliss Cheney M.D.   On: 07/27/2023 00:41   DG Chest 2 View  Result Date: 07/26/2023 CLINICAL DATA:  Shortness of breath. History of DVT and pulmonary embolus. Off of Eliquis for couple of weeks. EXAM: CHEST - 2 VIEW COMPARISON:  07/20/2023 FINDINGS: Power port type central venous catheter with tip over the cavoatrial junction region. No pneumothorax. Shallow inspiration. Linear atelectasis or infiltration seen in the right lung base. No pleural effusions. No pneumothorax. Mediastinal contours appear intact. Heart size and pulmonary vascularity are normal. IMPRESSION: Shallow inspiration with linear atelectasis or infiltration in the right lung base. Electronically Signed   By: Burman Nieves M.D.   On: 07/26/2023 23:24    Scheduled Meds:  bictegravir-emtricitabine-tenofovir AF  1 tablet Oral Daily   famotidine  20  mg Oral BID    oxyCODONE  10 mg Oral Q12H   traZODone  50 mg Oral QHS   Continuous Infusions:   LOS: 1 day   Hughie Closs, MD [facility]  07/28/2023, 1:00 PM   *Please note that this is a verbal dictation therefore any spelling or grammatical errors are due to the "Dragon Medical One" system interpretation.  Please page via Amion and do not message via secure chat for urgent patient care matters. Secure chat can be used for non urgent patient care matters.  How to contact the [facility] Attending or Consulting provider 7A - 7P or covering provider during after hours 7P -7A, for this patient?  Check the care team in [facility] and look for a) attending/consulting [facility] provider listed and b) the [facility] team listed. Page or secure chat 7A-7P. Log into [email or web address] and use [place]'s universal password to access. If you do not have the password, please contact the hospital operator. Locate the [facility] provider you are looking for under [facility] and page to a number that you can be directly reached. If you still have difficulty reaching the provider, please page the [facility] (Director on Call) for the Hospitalists listed on amion for assistance.

## 2023-07-28 NOTE — Progress Notes (Signed)
    Steve Stewart -he is now in the stepdown unit.  He is quite tachypneic.  Appears he is splinting quite a bit and complaining of pain.  I discussed with hospitalist.  The CT angiogram chest was of poor quality and they could not this for much about the PE burden.  However there is right lower lobe infarct.  His PE biomarkers of troponin lactic acid and BNP are normal.  On his echo there is no RV strain again today.  His procalcitonin is normal.  Therefore overall assessment is that his PE burden is not worse but is splinting because of the right lower lobe infarct and also because of the pain from that  Plan - Discussed with nursing start IV heparin monitor for hemoptysis.  If hemoptysis happens or gets worse this is likely from the infarct and he will need heparin but if it gets worse tranexamic acid nebulizer can be tried as well  -Start Dilaudid PCA with end-tidal CO2 monitoring  -Track PE biomarkers tomorrow     SIGNATURE    Dr. Kalman Shan, M.D., F.C.C.P,  Pulmonary and Critical Care Medicine Staff Physician, [facility] Center Director - Interstitial Lung Disease  Program  Pulmonary Fibrosis [facility] at [facility] [place], [place], [address]   Pager: [phone], If no answer  -> Check AMION or Try [phone] Telephone (clinical office): [phone] Telephone (research): [phone]  6:18 PM 07/28/2023

## 2023-07-28 NOTE — Plan of Care (Signed)

## 2023-07-28 NOTE — Consult Note (Signed)
NAME:  Steve Stewart, MRN:  [id number], DOB:  1990-12-06, LOS: 1 ADMISSION DATE:  07/26/2023, CONSULTATION DATE:  07/28/23 REFERRING MD:  Dr Hughie Closs, CHIEF COMPLAINT:  Acute PE worsening     History of Present Illness: Consult for pulmonary embolism with new onset acute hypoxemic respiratory failure.  History from the chart, patient and hospitalist   32 year old male with history of HIV on HAART treatment, seminoma s/p orchiectomy.  Suffered his first pulmonary embolism 06/22/2023 with left lower extremity DVT and was on Eliquis.  Seminoma diagnosed 04/28/2023 following 48-month history of testicular mass.  Status post orchiectomy 05/02/2023.  Then started testicular bleomycin every 3 weeks?  First dose 07/09/2023  On 06/21/2023 developed left lower extremity DVT and then on 06/22/2023 presented with acute pulmonary embolism right lower lobe main artery with extension into posterior basilar segmental arteries and distal PE without any evidence of right heart strain.  He was treated with IV heparin and then switched to Eliquis and then discharged on 06/25/23.  During this admission routine screening for HIV turned positive and he was started subsequently on Hart treatment  Followed with oncology 07/05/2023 and plan to start bleomycin 07/09/2023 with plans to hold apixaban 11 days after starting chemo he was advised to quit smoking.  His apixaban was then held on 07/17/2023 following oncology visit.  On this day his platelet count dropped to 69,000.  3 days later his platelet count dropped to 22,000 on 07/20/2023.  A CT angiogram chest on the same day did not show evidence of pulmonary embolism  But he presented to the emergency department on 07/27/2023 with acute pain behind left knee and right shoulder blade and pleuritic pain very similar to prior PE.  CT angiogram chest showed right lower lobe PE.  Radiologist interpreted this for submassive PE with RV LV ratio greater than 1.3 [subsequent  interpretation by Dr. Malachy Moan on 07/28/2023 of IR is that this PE did not have any RV strain as a distal PE].  Outpatient Eliquis was resumed.  His thrombocytopenia nearly resolved at the time of admission to 1 40012/6/24.  However on 07/28/2023 patient started having worsening and new onset acute hypoxemic respiratory failure requiring 2-2 L oxygen.  Interventional radiology recommended repeat CT angiogram chest that shows right lower lobe potential infarct.  Patient is having pleuritic pain.  Last dose of Eliquis 9 AM 07/28/2023  Repeat echocardiogram and pulmonary embolism biomarkers of troponin lactic acid and BNP pending  Past Medical History:    has a past medical history of HIV infection (HCC), Medical history non-contributory, and Testicular cancer (HCC).   reports that he quit smoking about 5 weeks ago. His smoking use included cigarettes. He has never used smokeless tobacco.  Past Surgical History:  Procedure Laterality Date   IR IMAGING GUIDED PORT INSERTION  06/18/2023   NO PAST SURGERIES     ORCHIECTOMY Left 05/02/2023   Procedure: LEFT INGUINAL ORCHIECTOMY;  Surgeon: Rene Paci, MD;  Location: [facility];  Service: Urology;  Laterality: Left;    No Known Allergies  Immunization History  Administered Date(s) Administered   Tdap 02/11/2012, 09/08/2022    History reviewed. No pertinent family history.   Current Facility-Administered Medications:    acetaminophen (TYLENOL) tablet 650 mg, 650 mg, Oral, Q6H PRN **OR** acetaminophen (TYLENOL) suppository 650 mg, 650 mg, Rectal, Q6H PRN, Julian Reil, Jared M, DO   albuterol (PROVENTIL) (2.5 MG/3ML) 0.083% nebulizer solution 2.5 mg, 2.5 mg, Nebulization, Q2H PRN, Hillary Bow, DO  bictegravir-emtricitabine-tenofovir AF (BIKTARVY) 50-200-25 MG per tablet 1 tablet, 1 tablet, Oral, Daily, Julian Reil, Jared M, DO, 1 tablet at 07/28/23 0904   famotidine (PEPCID) tablet 20 mg, 20 mg, Oral, BID, Julian Reil, Jared M, DO, 20  mg at 07/28/23 1610   magic mouthwash w/lidocaine, 15 mL, Oral, Q3H PRN, Hillary Bow, DO   morphine (PF) 2 MG/ML injection 2 mg, 2 mg, Intravenous, Q3H PRN, Hughie Closs, MD, 2 mg at 07/28/23 1141   ondansetron (ZOFRAN) tablet 4 mg, 4 mg, Oral, Q6H PRN **OR** ondansetron (ZOFRAN) injection 4 mg, 4 mg, Intravenous, Q6H PRN, Julian Reil, Jared M, DO, 4 mg at 07/27/23 2016   oxyCODONE (OXYCONTIN) 12 hr tablet 10 mg, 10 mg, Oral, Q12H, Pahwani, Ravi, MD, 10 mg at 07/28/23 1101   traZODone (DESYREL) tablet 50 mg, 50 mg, Oral, QHS, Gardner, Jared M, DO, 50 mg at 07/27/23 2126     Significant Hospital Events:  07/26/2023 - admit   Interim History / Subjective:   07/28/2023 - seen in bed [address]  Objective   Blood pressure 132/76, pulse 95, temperature 98.4 F (36.9 C), temperature source Oral, resp. rate 20, weight 116.1 kg, SpO2 99%.       No intake or output data in the 24 hours ending 07/28/23 1305 Filed Weights   07/26/23 2241  Weight: 116.1 kg    Examination: General: obese male, anxious HENT: on o2 Lungs: cTA bilaterllay. Splinting Cardiovascular: normal heart suyd Abdomen: Soft, obese Extremities: intact Neuro: Anxious. Alert and Orinted x3 GU: Looks normal  Resolved Hospital Problem list   x  Assessment & Plan:  ASSESSMENT / PLAN:  PULMONARY  A:  Acute Pulmonary Embolism - 1sst episode 06/22/23 with clearance on CT late Noiv 2024 but recurence 07/27/23 after Eliquis held 07/17/23 due to Bleomycin related thromboctypenia  Risk factors - stopping eliquis, obestity, Seminoma and REcent PE, and Smoking   07/28/2023 -> Worsenign/. NEw onset hypxoemic resp fialure due to RLL inflract +/- progression in PE. Baesd on radioogy report - doubt PE has progresied  P:   STOP DOAC (last dose 9am 12/7/2024_ Start IV heparin (d/w Pharmacy - chose 6pm 07/28/2023 ) as potential safe starting point Check PE biomarkerts  =- repeat [facility] for RV  = check bnp, trop,  lactate Incentive spirometry Might need pain control Quit smoking   INFECTIOUS A:   Incidental HIV diagnosis while in hospital last admit 06/22/23     S/p bleomycin 07/09/23 and thromboctyopenia 07/17/23 - 07/24/23 and now nearly normal 07/27/23 Anemia post bleo an  P:  - PRBC for hgb </= 6.9gm%    - exceptions are   -  if ACS susepcted/confirmed then transfuse for hgb </= 8.0gm%,  or    -  active bleeding with hemodynamic instability, then transfuse regardless of hemoglobin value   At at all times try to transfuse 1 unit prbc as possible with exception of active hemorrhage   Monitor platelet count with IV heparin   MSK/DERM RLL Pleuritic pain with RLL infarct  Plan  - monitor closes 0 might need opioids    Best practice (daily eval):  Move to sdu Per [facility]    SIGNATURE    Dr. Kalman Shan, M.D., F.C.C.P,  Pulmonary and Critical Care Medicine Staff Physician, [facility] Center Director - Interstitial Lung Disease  Program  Pulmonary Fibrosis [facility] at [place], [place], [address]  NPI Number:  NPI #[id number]  Pager: [phone], If no  answer  -> Check AMION or Try [phone] Telephone (clinical office): [phone] Telephone (research): [phone]  1:05 PM 07/28/2023   07/28/2023 1:05 PM    LABS    PULMONARY No results for input(s): "PHART", "PCO2ART", "PO2ART", "HCO3", "TCO2", "O2SAT" in the last 168 hours.  Invalid input(s): "PCO2", "PO2"  CBC Recent Labs  Lab 07/24/23 0759 07/26/23 2253 07/27/23 0532  HGB 11.3* 11.9* 11.4*  HCT 33.0* 35.6* 33.6*  WBC 1.1* 2.2* 2.4*  PLT 47* 135* 140*    COAGULATION No results for input(s): "INR" in the last 168 hours.  CARDIAC  No results for input(s): "TROPONINI" in the last 168 hours. No results for input(s): "PROBNP" in the last 168 hours.  CHEMISTRY Recent Labs  Lab 07/24/23 0759 07/26/23 2253 07/27/23 0532  NA 138 139 135  K  3.6 3.8 4.3  CL 105 105 104  CO2 27 25 22   GLUCOSE 111* 83 107*  BUN 13 18 15   CREATININE 0.89 0.93 0.94  CALCIUM 8.5* 8.8* 8.8*   Estimated Creatinine Clearance: 143.5 mL/min (by C-G formula based on SCr of 0.94 mg/dL).   LIVER Recent Labs  Lab 07/24/23 0759  AST 13*  ALT 28  ALKPHOS 53  BILITOT 0.3  PROT 6.1*  ALBUMIN 3.6     INFECTIOUS No results for input(s): "LATICACIDVEN", "PROCALCITON" in the last 168 hours.   ENDOCRINE CBG (last 3)  Recent Labs    07/28/23 1157  GLUCAP 94         IMAGING x48h  - image(s) personally visualized  -   highlighted in bold VAS [place] LOWER EXTREMITY VENOUS (DVT)  Result Date: 07/27/2023  Lower Venous DVT Study Patient Name:  KHYLON EBERLING  Date of Exam:   07/27/2023 Medical Rec #: [id number]             Accession #:    [id number] Date of Birth: [birth year]             Patient Gender: M Patient Age:   27 years Exam Location:  [facility] Procedure:      VAS [place] LOWER EXTREMITY VENOUS (DVT) Referring Phys: Lyda Perone --------------------------------------------------------------------------------  Indications: Pulmonary embolism.  Risk Factors: Confirmed PE DVT. Anticoagulation: Eliquis. Comparison Study: 06/21/2023 - RIGHT:                   - No evidence of common femoral vein obstruction.                    LEFT:                   - Findings consistent with acute deep vein thrombosis                   involving the left                   popliteal vein, left posterior tibial veins, and left peroneal                   veins.                   - No cystic structure found in the popliteal fossa. Performing Technologist: Chanda Busing RVT  Examination Guidelines: A complete evaluation includes B-mode imaging, spectral Doppler, color Doppler, and power Doppler as needed of all accessible portions of each vessel. Bilateral testing is considered an integral part of a complete examination.  Limited examinations for reoccurring  indications may be performed as noted. The reflux portion of the exam is performed with the patient in reverse Trendelenburg.  +---------+---------------+---------+-----------+----------+--------------+ RIGHT    CompressibilityPhasicitySpontaneityPropertiesThrombus Aging +---------+---------------+---------+-----------+----------+--------------+ CFV      Full           Yes      Yes                                 +---------+---------------+---------+-----------+----------+--------------+ SFJ      Full                                                        +---------+---------------+---------+-----------+----------+--------------+ FV Prox  Full                                                        +---------+---------------+---------+-----------+----------+--------------+ FV Mid   Full                                                        +---------+---------------+---------+-----------+----------+--------------+ FV DistalFull                                                        +---------+---------------+---------+-----------+----------+--------------+ PFV      Full                                                        +---------+---------------+---------+-----------+----------+--------------+ POP      Full           Yes      Yes                                 +---------+---------------+---------+-----------+----------+--------------+ PTV      Full                                                        +---------+---------------+---------+-----------+----------+--------------+ PERO     Full                                                        +---------+---------------+---------+-----------+----------+--------------+   +---------+---------------+---------+-----------+----------+--------------+ LEFT     CompressibilityPhasicitySpontaneityPropertiesThrombus Aging  +---------+---------------+---------+-----------+----------+--------------+ CFV      Full  Yes      Yes                                 +---------+---------------+---------+-----------+----------+--------------+ SFJ      Full                                                        +---------+---------------+---------+-----------+----------+--------------+ FV Prox  Full                                                        +---------+---------------+---------+-----------+----------+--------------+ FV Mid   Partial        Yes      Yes                  Acute          +---------+---------------+---------+-----------+----------+--------------+ FV DistalNone           No       No                   Acute          +---------+---------------+---------+-----------+----------+--------------+ PFV      Full                                                        +---------+---------------+---------+-----------+----------+--------------+ POP      None           No       No                   Acute          +---------+---------------+---------+-----------+----------+--------------+ PTV      None                                         Acute          +---------+---------------+---------+-----------+----------+--------------+ PERO     None                                         Acute          +---------+---------------+---------+-----------+----------+--------------+ Soleal   None                                         Acute          +---------+---------------+---------+-----------+----------+--------------+ Gastroc  None                                         Acute          +---------+---------------+---------+-----------+----------+--------------+  SSV      Full                                                        +---------+---------------+---------+-----------+----------+--------------+     Summary: RIGHT: - There is no evidence of  deep vein thrombosis in the lower extremity.  - No cystic structure found in the popliteal fossa.  LEFT: - Findings consistent with acute deep vein thrombosis involving the left femoral vein, left popliteal vein, left posterior tibial veins, and left peroneal veins. Findings consistent with acute intramuscular thrombosis involving the left gastrocnemius veins, and left soleal veins. - No cystic structure found in the popliteal fossa.  *See table(s) above for measurements and observations. Electronically signed by Carolynn Sayers on 07/27/2023 at 2:16:43 PM.    Final    ECHOCARDIOGRAM LIMITED  Result Date: 07/27/2023    ECHOCARDIOGRAM LIMITED REPORT   Patient Name:   JEQUAN LANDON Date of Exam: 07/27/2023 Medical Rec #:  [id number]            Height:       71.0 in Accession #:    [id number]           Weight:       256.0 lb Date of Birth:  [birth year]            BSA:          2.342 m Patient Age:    32 years             BP:           119/78 mmHg Patient Gender: M                    HR:           87 bpm. Exam Location:  Inpatient Procedure: Limited Echo, Cardiac Doppler and Color Doppler Indications:    I26.02 Pulmonary embolus  History:        Patient has prior history of Echocardiogram examinations, most                 recent 06/23/2023. Signs/Symptoms:Shortness of Breath and                 Dyspnea. Pulmonary embolus. Cancer. Cor Pulmonale.  Sonographer:    Sheralyn Boatman RDCS Referring Phys: [phone] JARED M GARDNER  Sonographer Comments: Technically difficult study due to poor echo windows. Patient in pain, could not turn. IMPRESSIONS  1. Left ventricular ejection fraction, by estimation, is 60 to 65%. The left ventricle has normal function. The left ventricle has no regional wall motion abnormalities.  2. Right ventricular systolic function is normal. The right ventricular size is moderately enlarged. Tricuspid regurgitation signal is inadequate for assessing PA pressure.  3. No evidence of mitral valve  regurgitation.  4. The inferior vena cava is normal in size with greater than 50% respiratory variability, suggesting right atrial pressure of 3 mmHg. Conclusion(s)/Recommendation(s): RV does look more dilated compared to prior study; c/f RV strain. FINDINGS  Left Ventricle: Left ventricular ejection fraction, by estimation, is 60 to 65%. The left ventricle has normal function. The left ventricle has no regional wall motion abnormalities. The left ventricular internal cavity size was normal in size. Right Ventricle: The right ventricular size is moderately enlarged. Right ventricular systolic function is normal.  Tricuspid regurgitation signal is inadequate for assessing PA pressure. Right Atrium: Right atrial size was normal in size. Tricuspid Valve: Tricuspid valve regurgitation is not demonstrated. Venous: The inferior vena cava is normal in size with greater than 50% respiratory variability, suggesting right atrial pressure of 3 mmHg. Additional Comments: Spectral Doppler performed. Color Doppler performed.  LEFT VENTRICLE PLAX 2D LVIDd:         4.30 cm LVIDs:         2.90 cm LV PW:         1.00 cm LV IVS:        1.10 cm LVOT diam:     2.40 cm LV SV:         66 LV SV Index:   28 LVOT Area:     4.52 cm  LV Volumes (MOD) LV vol d, MOD A2C: 68.2 ml LV vol d, MOD A4C: 69.0 ml LV vol s, MOD A2C: 21.6 ml LV vol s, MOD A4C: 21.8 ml LV SV MOD A2C:     46.6 ml LV SV MOD A4C:     69.0 ml LV SV MOD BP:      47.7 ml RIGHT VENTRICLE             IVC RV S prime:     10.90 cm/s  IVC diam: 1.90 cm TAPSE (M-mode): 2.0 cm RIGHT ATRIUM           Index RA Area:     14.90 cm RA Volume:   40.20 ml  17.17 ml/m  AORTIC VALVE LVOT Vmax:   102.00 cm/s LVOT Vmean:  64.600 cm/s LVOT VTI:    0.145 m  AORTA Ao Asc diam: 3.30 cm MITRAL VALVE MV Area (PHT): 4.60 cm    SHUNTS MV Decel Time: 165 msec    Systemic VTI:  0.14 m MV E velocity: 82.40 cm/s  Systemic Diam: 2.40 cm MV A velocity: 78.40 cm/s MV E/A ratio:  1.05 [employer] account executive signed by Carolan Clines Signature Date/Time: 07/27/2023/12:17:48 PM    Final    CT Angio Chest PE W and/or Wo Contrast  Result Date: 07/27/2023 CLINICAL DATA:  High probability for PE. EXAM: CT ANGIOGRAPHY CHEST WITH CONTRAST TECHNIQUE: Multidetector CT imaging of the chest was performed using the standard protocol during bolus administration of intravenous contrast. Multiplanar CT image reconstructions and MIPs were obtained to evaluate the vascular anatomy. RADIATION DOSE REDUCTION: This exam was performed according to the departmental dose-optimization program which includes automated exposure control, adjustment of the mA and/or kV according to patient size and/or use of iterative reconstruction technique. CONTRAST:  80mL OMNIPAQUE IOHEXOL 350 MG/ML SOLN COMPARISON:  CT angiogram chest 07/19/2028. FINDINGS: Cardiovascular: There are pulmonary emboli within lobar, segmental and subsegmental branches of the right lower lobe there also pulmonary emboli within subsegmental branches of the left upper lobe. Aorta is normal in size. Heart is normal in size. There is no pericardial effusion. Right chest port catheter tip ends in the distal SVC. Mediastinum/Nodes: No enlarged mediastinal, hilar, or axillary lymph nodes. Thyroid gland, trachea, and esophagus demonstrate no significant findings. Lungs/Pleura: Peripheral patchy ground-glass opacities in the right lower lobe are favored as pulmonary infarcts. There is minimal atelectasis in the right middle lobe. No pleural effusion or pneumothorax. Upper Abdomen: No acute abnormality. Musculoskeletal: No chest wall abnormality. No acute or significant osseous findings. Review of the MIP images confirms the above findings. IMPRESSION: 1. Bilateral pulmonary emboli, right greater than left. Positive for acute PE  with CTevidence of right heart strain (RV/LV Ratio = 1.3) consistent with at least submassive (intermediate risk) PE. The presence of right heart  strain has been associated with an increased risk of morbidity and mortality. 2. Peripheral patchy ground-glass opacities in the right lower lobe are favored as pulmonary infarcts. These results were called by telephone at the time of interpretation on 07/27/2023 at 12:39 am to provider [facility] , who verbally acknowledged these results. Electronically Signed   By: Darliss Cheney M.D.   On: 07/27/2023 00:41   DG Chest 2 View  Result Date: 07/26/2023 CLINICAL DATA:  Shortness of breath. History of DVT and pulmonary embolus. Off of Eliquis for couple of weeks. EXAM: CHEST - 2 VIEW COMPARISON:  07/20/2023 FINDINGS: Power port type central venous catheter with tip over the cavoatrial junction region. No pneumothorax. Shallow inspiration. Linear atelectasis or infiltration seen in the right lung base. No pleural effusions. No pneumothorax. Mediastinal contours appear intact. Heart size and pulmonary vascularity are normal. IMPRESSION: Shallow inspiration with linear atelectasis or infiltration in the right lung base. Electronically Signed   By: Burman Nieves M.D.   On: 07/26/2023 23:24

## 2023-07-28 NOTE — Progress Notes (Signed)
PHARMACY - ANTICOAGULATION CONSULT NOTE  Pharmacy Consult for IV heparin Indication: recurrent PE  No Known Allergies  Patient Measurements: Weight: 116.1 kg (256 lb) Heparin Dosing Weight: 100 kg  Vital Signs: Temp: 98.4 F (36.9 C) (12/07 1154) Temp Source: Oral (12/07 1154) BP: 132/76 (12/07 1154) Pulse Rate: 95 (12/07 1154)  Labs: Recent Labs    07/26/23 2253 07/27/23 0532  HGB 11.9* 11.4*  HCT 35.6* 33.6*  PLT 135* 140*  CREATININE 0.93 0.94  TROPONINIHS 2  --     Estimated Creatinine Clearance: 143.5 mL/min (by C-G formula based on SCr of 0.94 mg/dL).   Medical History: Past Medical History:  Diagnosis Date   HIV infection (HCC)    Medical history non-contributory    Testicular cancer (HCC)     Medications:  Medications Prior to Admission  Medication Sig Dispense Refill Last Dose   acetaminophen (TYLENOL) 650 MG CR tablet Take 1,300 mg by mouth every 8 (eight) hours as needed for pain.   07/26/2023   albuterol (VENTOLIN HFA) 108 (90 Base) MCG/ACT inhaler Inhale 1 puff into the lungs every 6 (six) hours as needed for wheezing or shortness of breath.   Past Month   bictegravir-emtricitabine-tenofovir AF (BIKTARVY) 50-200-25 MG TABS tablet Take 1 tablet by mouth daily. 30 tablet 2 07/26/2023   dexamethasone (DECADRON) 4 MG tablet Take 2 tablets (8 mg total) by mouth daily. Start the day after last cisplatin dose on day 6 of chemo x 3 days.Take with food. 30 tablet 1 unknown   famotidine (PEPCID) 20 MG tablet Take 1 tablet (20 mg total) by mouth 2 (two) times daily. 30 tablet 2 07/26/2023   lidocaine (XYLOCAINE) 2 % solution Use as directed 15 mLs in the mouth or throat every 3 (three) hours as needed for mouth pain. Mix 1 to 1 ratio with Maalox to rinse for pain 100 mL 0 unknown   lidocaine-prilocaine (EMLA) cream Apply to affected area once 30 g 3 Past Week   prochlorperazine (COMPAZINE) 10 MG tablet Take 1 tablet (10 mg total) by mouth every 6 (six) hours as  needed for nausea or vomiting. 30 tablet 1 07/26/2023   traZODone (DESYREL) 50 MG tablet Take 1 tablet (50 mg total) by mouth at bedtime. 90 tablet 1 07/26/2023   apixaban (ELIQUIS) 5 MG TABS tablet Take 5 mg by mouth 2 (two) times daily. ON HOLD as of 07/24/23   07/17/2023   ondansetron (ZOFRAN) 8 MG tablet Take 1 tablet (8 mg total) by mouth every 8 (eight) hours as needed for nausea or vomiting. Start on the third day after last cisplatin dose on day 8 of chemo (Patient not taking: Reported on 07/27/2023) 30 tablet 1 Not Taking   Scheduled:   bictegravir-emtricitabine-tenofovir AF  1 tablet Oral Daily   famotidine  20 mg Oral BID   oxyCODONE  10 mg Oral Q12H   traZODone  50 mg Oral QHS   PRN: acetaminophen **OR** acetaminophen, albuterol, magic mouthwash w/lidocaine, morphine injection, ondansetron **OR** ondansetron (ZOFRAN) IV  Assessment: 68 yoM with PMH testicular cancer on cytotoxic chemo (last received 11/18), HIV, recent PE/DVT diagnosed 11/1 which had resolved on CT scan from 11/29, now returns with new bilateral submassive PE with RH strain noted on CT (Eliquis had been on hold the past few weeks d/t chemo-induced thrombocytopenia, with Plt nadir 22k). Initially started on Eliquis 10 mg BID, but today PCCM MD concerned with clinical deterioration/possible need for thrombolysis, and Pharmacy requested to transition patient to  IV heparin infusion.  Baseline INR, aPTT: not done Prior anticoagulation: Eliquis 10 mg PO bid; LD 12/7 @ 0900  Significant events:  Today, 07/28/2023: CBC: Hgb slightly low but stable; Plt improved since chemo, now nearly WNL SCr stable < 1.0; currently at baseline No bleeding or infusion issues per nursing PCCM would like to start heparin as soon as possible given clinical deterioration (ie < 12 hr after last Eliquis dose); planning repeat CTA today given high bleed risk associated w/ thrombolysis immediately after high-dose DOAC.  I recommended a minimum of 6  hr delay after Eliquis dose, in the absence of critical deterioration such as hemodynamic instability or imminent respiratory failure  Goal of Therapy: Heparin level 0.3-0.7 units/ml Monitor platelets by anticoagulation protocol: Yes  Plan: After reviewing labs/imaging, PCCM opting to start heparin at 6pm today (~9 hr after last Eliquis dose At 6p, start Heparin 1800 units/hr IV infusion; no bolus Check aPTT level 6 hrs after start Daily CBC and heparin level; will need to dose heparin from aPTT until DOAC effects have dissipated Monitor for signs of bleeding or thrombosis  Bernadene Person, PharmD, BCPS [phone] 07/28/2023, 12:36 PM

## 2023-07-29 DIAGNOSIS — I2609 Other pulmonary embolism with acute cor pulmonale: Secondary | ICD-10-CM | POA: Diagnosis not present

## 2023-07-29 DIAGNOSIS — J9601 Acute respiratory failure with hypoxia: Secondary | ICD-10-CM | POA: Diagnosis not present

## 2023-07-29 LAB — COMPREHENSIVE METABOLIC PANEL
ALT: 23 U/L (ref 0–44)
AST: 13 U/L — ABNORMAL LOW (ref 15–41)
Albumin: 3 g/dL — ABNORMAL LOW (ref 3.5–5.0)
Alkaline Phosphatase: 70 U/L (ref 38–126)
Anion gap: 10 (ref 5–15)
BUN: 14 mg/dL (ref 6–20)
CO2: 26 mmol/L (ref 22–32)
Calcium: 8.6 mg/dL — ABNORMAL LOW (ref 8.9–10.3)
Chloride: 92 mmol/L — ABNORMAL LOW (ref 98–111)
Creatinine, Ser: 0.99 mg/dL (ref 0.61–1.24)
GFR, Estimated: >60 mL/min (ref >60)
Glucose, Bld: 107 mg/dL — ABNORMAL HIGH (ref 70–99)
Potassium: 4.4 mmol/L (ref 3.5–5.1)
Sodium: 128 mmol/L — ABNORMAL LOW (ref 135–145)
Total Bilirubin: 0.4 mg/dL (ref <1.2)
Total Protein: 7.3 g/dL (ref 6.5–8.1)

## 2023-07-29 LAB — CBC
HCT: 32.1 % — ABNORMAL LOW (ref 39.0–52.0)
Hemoglobin: 10.9 g/dL — ABNORMAL LOW (ref 13.0–17.0)
MCH: 29 pg (ref 26.0–34.0)
MCHC: 34 g/dL (ref 30.0–36.0)
MCV: 85.4 fL (ref 80.0–100.0)
Platelets: 264 10*3/uL (ref 150–400)
RBC: 3.76 MIL/uL — ABNORMAL LOW (ref 4.22–5.81)
RDW: 14.5 % (ref 11.5–15.5)
WBC: 10.1 10*3/uL (ref 4.0–10.5)
nRBC: 0.2 % (ref 0.0–0.2)

## 2023-07-29 LAB — HEPARIN LEVEL (UNFRACTIONATED): Heparin Unfractionated: >1.1 [IU]/mL — ABNORMAL HIGH (ref 0.30–0.70)

## 2023-07-29 LAB — PROCALCITONIN: Procalcitonin: 0.33 ng/mL

## 2023-07-29 LAB — BRAIN NATRIURETIC PEPTIDE: B Natriuretic Peptide: 42.5 pg/mL (ref 0.0–100.0)

## 2023-07-29 LAB — APTT
aPTT: 54 s — ABNORMAL HIGH (ref 24–36)
aPTT: 58 s — ABNORMAL HIGH (ref 24–36)
aPTT: 74 s — ABNORMAL HIGH (ref 24–36)

## 2023-07-29 LAB — PHOSPHORUS: Phosphorus: 3.8 mg/dL (ref 2.5–4.6)

## 2023-07-29 LAB — TROPONIN I (HIGH SENSITIVITY): Troponin I (High Sensitivity): 3 ng/L (ref <18)

## 2023-07-29 LAB — MAGNESIUM: Magnesium: 2.1 mg/dL (ref 1.7–2.4)

## 2023-07-29 MED ORDER — HEPARIN BOLUS VIA INFUSION
1000.0000 [IU] | Freq: Once | INTRAVENOUS | Status: AC
  Administered 2023-07-29: 1000 [IU] via INTRAVENOUS
  Filled 2023-07-29: qty 1000

## 2023-07-29 MED ORDER — HEPARIN BOLUS VIA INFUSION
3000.0000 [IU] | Freq: Once | INTRAVENOUS | Status: AC
  Administered 2023-07-29: 3000 [IU] via INTRAVENOUS
  Filled 2023-07-29: qty 3000

## 2023-07-29 MED ORDER — SODIUM CHLORIDE 0.9 % IV SOLN: INTRAVENOUS | Status: AC | PRN

## 2023-07-29 MED ORDER — HEPARIN (PORCINE) 25000 UT/250ML-% IV SOLN
2300.0000 [IU]/h | INTRAVENOUS | Status: DC
  Administered 2023-07-29 – 2023-07-30 (×2): 2300 [IU]/h via INTRAVENOUS
  Filled 2023-07-29 (×2): qty 250

## 2023-07-29 NOTE — Progress Notes (Signed)
   PM camera rounds  - looks stable - doing IS - HR 104 - rest of vitals look good  Plan  - continue current course    SIGNATURE    Dr. Kalman Shan, M.D., F.C.C.P,  Pulmonary and Critical Care Medicine Staff Physician, [facility] Director - Interstitial Lung Disease  Program  Pulmonary Fibrosis [facility] at [facility] [place], [place], [address]   Pager: [phone], If no answer  -> Check AMION or Try [phone] Telephone (clinical office): [phone] Telephone (research): [phone]  6:28 PM 07/29/2023

## 2023-07-29 NOTE — Plan of Care (Signed)
POC and goals discussed with family and patient, also education done on PCA and how to use it, what to look out for and when to call the nurse, time given for questions, patient handbook/guide at bedside.  Problem: Education: Goal: Knowledge of General Education information will improve Description: Including pain rating scale, medication(s)/side effects and non-pharmacologic comfort measures Outcome: Progressing   Problem: Health Behavior/Discharge Planning: Goal: Ability to manage health-related needs will improve Outcome: Progressing   Problem: Clinical Measurements: Goal: Ability to maintain clinical measurements within normal limits will improve Outcome: Progressing Goal: Will remain free from infection Outcome: Progressing Goal: Diagnostic test results will improve Outcome: Progressing Goal: Respiratory complications will improve Outcome: Progressing Goal: Cardiovascular complication will be avoided Outcome: Progressing   Problem: Activity: Goal: Risk for activity intolerance will decrease Outcome: Progressing   Problem: Nutrition: Goal: Adequate nutrition will be maintained Outcome: Progressing   Problem: Coping: Goal: Level of anxiety will decrease Outcome: Progressing   Problem: Elimination: Goal: Will not experience complications related to bowel motility Outcome: Progressing Goal: Will not experience complications related to urinary retention Outcome: Progressing   Problem: Pain Management: Goal: General experience of comfort will improve Outcome: Progressing   Problem: Safety: Goal: Ability to remain free from injury will improve Outcome: Progressing   Problem: Skin Integrity: Goal: Risk for impaired skin integrity will decrease Outcome: Progressing

## 2023-07-29 NOTE — Progress Notes (Signed)
eLink Physician-Brief Progress Note Patient Name: Steve Stewart DOB: 27-May-1991 MRN: [id number]   Date of Service  07/29/2023  HPI/Events of Note  Notified by RN that patient desaturated to 88% intermittently while on 5L nasal cannula.   Pt is a 32/M with PE.  BP 136/73, HR 102, RR 21, O2 sats 97%  eICU Interventions  Ok to start on heated high flow O2.      Intervention Category Intermediate Interventions: Other:  [name] 07/29/2023, 8:53 PM  12:07 AM Followed up on the patient and he is tolerating BIPAP saturating at 97%, RR in the 20s.  He appears comfortable.  Plan> Continue on BIPAP.

## 2023-07-29 NOTE — Progress Notes (Signed)
PHARMACY - ANTICOAGULATION CONSULT NOTE  Pharmacy Consult for IV heparin Indication: recurrent PE  No Known Allergies  Patient Measurements: Height: 5\' 11"  (180.3 cm) Weight: 104.4 kg (230 lb 2.6 oz) IBW/kg (Calculated) : 75.3 Heparin Dosing Weight: 100 kg  Vital Signs: Temp: 97.5 F (36.4 C) (12/07 2300) Temp Source: Oral (12/07 2045) BP: 122/64 (12/07 2300) Pulse Rate: 108 (12/07 2300)  Labs: Recent Labs    07/26/23 2253 07/27/23 0532 07/28/23 1438 07/28/23 2356  HGB 11.9* 11.4*  --   --   HCT 35.6* 33.6*  --   --   PLT 135* 140*  --   --   APTT  --   --   --  74*  CREATININE 0.93 0.94  --   --   TROPONINIHS 2  --  <2  --     Estimated Creatinine Clearance: 136.1 mL/min (by C-G formula based on SCr of 0.94 mg/dL).   Medical History: Past Medical History:  Diagnosis Date   HIV infection (HCC)    Medical history non-contributory    Testicular cancer (HCC)     Medications:  Medications Prior to Admission  Medication Sig Dispense Refill Last Dose   acetaminophen (TYLENOL) 650 MG CR tablet Take 1,300 mg by mouth every 8 (eight) hours as needed for pain.   07/26/2023   albuterol (VENTOLIN HFA) 108 (90 Base) MCG/ACT inhaler Inhale 1 puff into the lungs every 6 (six) hours as needed for wheezing or shortness of breath.   Past Month   bictegravir-emtricitabine-tenofovir AF (BIKTARVY) 50-200-25 MG TABS tablet Take 1 tablet by mouth daily. 30 tablet 2 07/26/2023   dexamethasone (DECADRON) 4 MG tablet Take 2 tablets (8 mg total) by mouth daily. Start the day after last cisplatin dose on day 6 of chemo x 3 days.Take with food. 30 tablet 1 unknown   famotidine (PEPCID) 20 MG tablet Take 1 tablet (20 mg total) by mouth 2 (two) times daily. 30 tablet 2 07/26/2023   lidocaine (XYLOCAINE) 2 % solution Use as directed 15 mLs in the mouth or throat every 3 (three) hours as needed for mouth pain. Mix 1 to 1 ratio with Maalox to rinse for pain 100 mL 0 unknown   lidocaine-prilocaine  (EMLA) cream Apply to affected area once 30 g 3 Past Week   prochlorperazine (COMPAZINE) 10 MG tablet Take 1 tablet (10 mg total) by mouth every 6 (six) hours as needed for nausea or vomiting. 30 tablet 1 07/26/2023   traZODone (DESYREL) 50 MG tablet Take 1 tablet (50 mg total) by mouth at bedtime. 90 tablet 1 07/26/2023   apixaban (ELIQUIS) 5 MG TABS tablet Take 5 mg by mouth 2 (two) times daily. ON HOLD as of 07/24/23   07/17/2023   ondansetron (ZOFRAN) 8 MG tablet Take 1 tablet (8 mg total) by mouth every 8 (eight) hours as needed for nausea or vomiting. Start on the third day after last cisplatin dose on day 8 of chemo (Patient not taking: Reported on 07/27/2023) 30 tablet 1 Not Taking   Scheduled:   bictegravir-emtricitabine-tenofovir AF  1 tablet Oral Daily   Chlorhexidine Gluconate Cloth  6 each Topical Daily   famotidine  20 mg Oral BID   feeding supplement  237 mL Oral BID BM   HYDROmorphone   Intravenous Q4H   oxyCODONE  10 mg Oral Q12H   traZODone  50 mg Oral QHS   PRN: acetaminophen **OR** acetaminophen, albuterol, ALPRAZolam, diphenhydrAMINE **OR** diphenhydrAMINE, magic mouthwash w/lidocaine, naloxone **AND**  sodium chloride flush, ondansetron (ZOFRAN) IV, mouth rinse  Assessment: 50 yoM with PMH testicular cancer on cytotoxic chemo (last received 11/18), HIV, recent PE/DVT diagnosed 11/1 which had resolved on CT scan from 11/29, now returns with new bilateral submassive PE with RH strain noted on CT (Eliquis had been on hold the past few weeks d/t chemo-induced thrombocytopenia, with Plt nadir 22k). Initially started on Eliquis 10 mg BID, but today PCCM MD concerned with clinical deterioration/possible need for thrombolysis, and Pharmacy requested to transition patient to IV heparin infusion.  Prior anticoagulation: Eliquis 10 mg PO bid; LD 12/7 @ 0900  Significant events:  Today, 07/29/2023: Initial aPTT 74 sec- therapeutic on IV heparin 1800 units/hr CBC: Hgb slightly low but  stable; Plt improved since chemo, now nearly WNL No bleeding or infusion issues per nursing  Goal of Therapy: Heparin level 0.3-0.7 units/ml aPTT 66-102 sec Monitor platelets by anticoagulation protocol: Yes  Plan: Continue IV Heparin 1800 units/hr  Check confirmatory aPTT @ 0600 Daily CBC and heparin level; will need to dose heparin from aPTT until DOAC effects have dissipated Monitor for signs of bleeding or thrombosis  Junita Push, PharmD, BCPS 07/29/2023, 1:14 AM

## 2023-07-29 NOTE — Plan of Care (Signed)
POC and goals reviewed, plans are for patient to wear BiPAP tonight, time given for questions. Patient handbook/guide at bedside.  Problem: Education: Goal: Knowledge of General Education information will improve Description: Including pain rating scale, medication(s)/side effects and non-pharmacologic comfort measures Outcome: Progressing   Problem: Health Behavior/Discharge Planning: Goal: Ability to manage health-related needs will improve Outcome: Progressing   Problem: Clinical Measurements: Goal: Ability to maintain clinical measurements within normal limits will improve Outcome: Progressing Goal: Will remain free from infection Outcome: Progressing Goal: Diagnostic test results will improve Outcome: Progressing Goal: Respiratory complications will improve Outcome: Progressing Goal: Cardiovascular complication will be avoided Outcome: Progressing   Problem: Activity: Goal: Risk for activity intolerance will decrease Outcome: Progressing   Problem: Nutrition: Goal: Adequate nutrition will be maintained Outcome: Progressing   Problem: Coping: Goal: Level of anxiety will decrease Outcome: Progressing   Problem: Elimination: Goal: Will not experience complications related to bowel motility Outcome: Progressing Goal: Will not experience complications related to urinary retention Outcome: Progressing   Problem: Pain Management: Goal: General experience of comfort will improve Outcome: Progressing   Problem: Safety: Goal: Ability to remain free from injury will improve Outcome: Progressing   Problem: Skin Integrity: Goal: Risk for impaired skin integrity will decrease Outcome: Progressing

## 2023-07-29 NOTE — Progress Notes (Signed)
PHARMACY - ANTICOAGULATION CONSULT NOTE  Pharmacy Consult for Heparin Indication: pulmonary embolus  No Known Allergies  Patient Measurements: Height: 5\' 11"  (180.3 cm) Weight: 104.4 kg (230 lb 2.6 oz) IBW/kg (Calculated) : 75.3 Heparin Dosing Weight:  97.2 kg  Vital Signs: Temp: 98 F (36.7 C) (12/08 1324) Temp Source: Oral (12/08 1324) BP: 116/57 (12/08 1617) Pulse Rate: 99 (12/08 1617)  Labs: Recent Labs    07/26/23 2253 07/27/23 0532 07/28/23 1438 07/28/23 2356 07/29/23 0616 07/29/23 1626  HGB 11.9* 11.4*  --   --  10.9*  --   HCT 35.6* 33.6*  --   --  32.1*  --   PLT 135* 140*  --   --  264  --   APTT  --   --   --  74* 58* 54*  HEPARINUNFRC  --   --   --   --  >1.10*  --   CREATININE 0.93 0.94  --   --  0.99  --   TROPONINIHS 2  --  <2  --  3  --     Estimated Creatinine Clearance: 129.2 mL/min (by C-G formula based on SCr of 0.99 mg/dL).   Medical History: Past Medical History:  Diagnosis Date   HIV infection (HCC)    Medical history non-contributory    Testicular cancer (HCC)     Assessment: AC/Heme: New PE and + dopplers for L DVT: Resume Eliquis (has been off recently) - CT showed bilateral PE right greater than left with right heart strain.  - Hgb 11.4, Plts up to 140 now - 12/7: switch to heparin, PCCM thinking he will need EKOS - 12/8: HL >1.1 (from Eliquis), aPTT 74>> 58>> now 54 lower. No bleeding per RN  Goal of Therapy:  aPTT 66-102 seconds Monitor platelets by anticoagulation protocol: Yes   Plan:  aPTT 54: Heparin 3000 unit IV bolus Increase infusion to 2300 units/hr Recheck aPTT in 8 hours. Daily aPTT, HL, and CBC   Kristelle Cavallaro S. Merilynn Finland, PharmD, BCPS Clinical Staff Pharmacist [email or web address] Merilynn Finland, Brandun Pinn Stillinger 07/29/2023,5:32 PM

## 2023-07-29 NOTE — Consult Note (Addendum)
NAME:  Steve Stewart, MRN:  [id number], DOB:  1991-03-22, LOS: 2 ADMISSION DATE:  07/26/2023, CONSULTATION DATE:  07/28/23 REFERRING MD:  Dr Hughie Closs, CHIEF COMPLAINT:  Acute PE worsening     BRIEF   32 year old male with history of HIV on HAART treatment, seminoma s/p orchiectomy.  Suffered his first pulmonary embolism 06/22/2023 with left lower extremity DVT and was on Eliquis.  Seminoma diagnosed 04/28/2023 following 31-month history of testicular mass.  Status post orchiectomy 05/02/2023.  Then started testicular bleomycin every 3 weeks?  First dose 07/09/2023  On 06/21/2023 developed left lower extremity DVT and then on 06/22/2023 presented with acute pulmonary embolism right lower lobe main artery with extension into posterior basilar segmental arteries and distal PE without any evidence of right heart strain.  He was treated with IV heparin and then switched to Eliquis and then discharged on 06/25/23.  During this admission routine screening for HIV turned positive and he was started subsequently on Hart treatment  Followed with oncology 07/05/2023 and plan to start bleomycin 07/09/2023 with plans to hold apixaban 11 days after starting chemo he was advised to quit smoking.  His apixaban was then held on 07/17/2023 following oncology visit.  On this day his platelet count dropped to 69,000.  3 days later his platelet count dropped to 22,000 on 07/20/2023.  A CT angiogram chest on the same day did not show evidence of pulmonary embolism  But he presented to the emergency department on 07/27/2023 with acute pain behind left knee and right shoulder blade and pleuritic pain very similar to prior PE.  CT angiogram chest showed right lower lobe PE.  Radiologist interpreted this for submassive PE with RV LV ratio greater than 1.3 [subsequent interpretation by Dr. Malachy Moan on 07/28/2023 of IR is that this PE did not have any RV strain as a distal PE].  Outpatient Eliquis was resumed.  His  thrombocytopenia nearly resolved at the time of admission to 1 40012/6/24.  However on 07/28/2023 patient started having worsening and new onset acute hypoxemic respiratory failure requiring 2-2 L oxygen.  Interventional radiology recommended repeat CT angiogram chest that shows right lower lobe potential infarct.  Patient is having pleuritic pain.  Last dose of Eliquis 9 AM 07/28/2023  Repeat echocardiogram and pulmonary embolism biomarkers of troponin lactic acid and BNP pending  Past Medical History:   has a past medical history of HIV infection (HCC), Medical history non-contributory, and Testicular cancer (HCC).   has a past surgical history that includes No past surgeries; Orchiectomy (Left, 05/02/2023); and IR IMAGING GUIDED PORT INSERTION (06/18/2023).    Significant Hospital Events:  07/26/2023 - admit 07/28/23 - PCCM Consult  Interim History / Subjective:   07/29/2023 - ECHO, PE biomarkers all against submaiss PE. CTA was poor quality to judge PE burden but had RLL infarct. Significant splinting with pain and PCA  dilaudid strted overnight. Currently on 5L  [place] and IV heparin gtt , per nursing he is not needed Dilaudid PCA much.  His end-tidal CO2 was between 27 and 32.  He is afraid of getting intubated.  But after discussion he is full code.  He is willing to try BiPAP.  Objective   Blood pressure 107/77, pulse (!) 106, temperature 98.2 F (36.8 C), temperature source Oral, resp. rate 20, height 5\' 11"  (1.803 m), weight 104.4 kg, SpO2 95%.    FiO2 (%):  [28 %] 28 %   Intake/Output Summary (Last 24 hours) at 07/29/2023 0810 Last  data filed at 07/29/2023 0630 Gross per 24 hour  Intake 1344.44 ml  Output 800 ml  Net 544.44 ml   Filed Weights   07/26/23 2241 07/28/23 1620  Weight: 116.1 kg 104.4 kg    Examination: General Appearance:  Looks unwell on oxygen.  His head is falling off after chemo. Head:  Normocephalic, without obvious abnormality, atraumatic Eyes:  PERRL - yes,  conjunctiva/corneas - muddy     Ears:  Normal external ear canals, both ears Nose:  G tube - no but has on 5 L oxygen Throat:  ETT TUBE - no , OG tube - no Neck:  Supple,  No enlargement/tenderness/nodules Lungs: Clear to auscultation bilaterally, mild tachypnea but not paradoxical Heart:  S1 and S2 normal, no murmur, CVP - no.  Pressors - no Abdomen:  Soft, no masses, no organomegaly Genitalia / Rectal:  Not done Extremities:  Extremities-intact Skin:  ntact in exposed areas . Sacral area -not examined Neurologic:  Sedation -Dilaudid PCA-> RASS -+1. Moves all 4s -yes. CAM-ICU -negative. Orientation -x 3     Resolved Hospital Problem list   x  Assessment & Plan:   Acute Pulmonary Embolism  -07/26/2023   Acute respiratory failure-07/28/2023 -due to right lower lobe infarct  - Risk factors - stopping eliquis, obestity, Seminoma and REcent PE, and Smoking - 1sst episode 06/22/23 with clearance on CT late Noiv 2024 but recurence 07/26/23 after Eliquis held 07/17/23 due to Bleomycin related thromboctypenia -R xwith Eliquis 12/6 - 07/28/23    -Course complicated 07/28/23 with right lower lobe pulmonary infarct and acute hypoxic respiratory failure -> Eliquis switched IV heparin   07/29/2023 -> stable on 5 L nasal cannula.  Open to trying BiPAP.   P:   Continue IV heparin -would likely need at least through 07/31/2023 Check PE biomarkers 07/30/2023  = check bnp, trop, lactate BiPAP positive airway pressure to open of atelectasis  Incentive spirometry Quit smoking   Right lower lobe pulmonary infarct with the pleuritic pain  -Ongoing pain but somewhat stable  Plan  - Continue Dilaudid PCA with end-tidal CO2 monitoring    Incidental HIV diagnosis while in hospital last admit 06/22/23     S/p bleomycin 07/09/23 and thromboctyopenia 07/17/23 - 07/24/23 and now nearly normal 07/27/23 Anemia post bleo an  P:  - PRBC for hgb </= 6.9gm%    - exceptions are   -  if ACS  susepcted/confirmed then transfuse for hgb </= 8.0gm%,  or    -  active bleeding with hemodynamic instability, then transfuse regardless of hemoglobin value   At at all times try to transfuse 1 unit prbc as possible with exception of active hemorrhage   Monitor platelet count with IV heparin       Best practice (daily eval):  Per [facility]  Disposition: Keep in stepdown 07/29/2023     ATTESTATION & SIGNATURE   The patient Miklos Steely is critically ill with multiple organ systems failure and requires high complexity decision making for assessment and support, frequent evaluation and titration of therapies, application of advanced monitoring technologies and extensive interpretation of multiple databases and discussion with other appropriate health care personnel such as bedside nurses, social workers, case Production designer, theatre/television/film, consultants, respiratory therapists, nutritionists, secretaries etc.,  Critical care time includes but is not restricted to just documentation time. Documentation can happen in parallel or sequential to care time depending on case mix urgency and priorities for the shift. So, overall critical Care Time devoted to patient care services  described in this note is  35  Minutes.   This time reflects time of care of this signee Dr Kalman Shan which includ does not reflect procedure time, or teaching time or supervisory time of PA/NP/Med student/Med Resident etc but could involve care discussion time     Dr. Kalman Shan, M.D., [facility].C.P Pulmonary and Critical Care Medicine Staff Physician, [facility] Pager: [phone], If no answer or between  15:00h - 7:00h: call [phone]  07/29/2023 8:32 AM     LABS    PULMONARY No results for input(s): "PHART", "PCO2ART", "PO2ART", "HCO3", "TCO2", "O2SAT" in the last 168 hours.  Invalid input(s): "PCO2", "PO2"  CBC Recent Labs  Lab 07/26/23 2253 07/27/23 0532  07/29/23 0616  HGB 11.9* 11.4* 10.9*  HCT 35.6* 33.6* 32.1*  WBC 2.2* 2.4* 10.1  PLT 135* 140* 264    COAGULATION No results for input(s): "INR" in the last 168 hours.  CARDIAC  No results for input(s): "TROPONINI" in the last 168 hours. No results for input(s): "PROBNP" in the last 168 hours.  CHEMISTRY Recent Labs  Lab 07/24/23 0759 07/26/23 2253 07/27/23 0532 07/29/23 0616  NA 138 139 135 128*  K 3.6 3.8 4.3 4.4  CL 105 105 104 92*  CO2 27 25 22 26   GLUCOSE 111* 83 107* 107*  BUN 13 18 15 14   CREATININE 0.89 0.93 0.94 0.99  CALCIUM 8.5* 8.8* 8.8* 8.6*  MG  --   --   --  2.1  PHOS  --   --   --  3.8   Estimated Creatinine Clearance: 129.2 mL/min (by C-G formula based on SCr of 0.99 mg/dL).   LIVER Recent Labs  Lab 07/24/23 0759 07/29/23 0616  AST 13* 13*  ALT 28 23  ALKPHOS 53 70  BILITOT 0.3 0.4  PROT 6.1* 7.3  ALBUMIN 3.6 3.0*     INFECTIOUS Recent Labs  Lab 07/28/23 1438  LATICACIDVEN 1.3  PROCALCITON 0.12     ENDOCRINE CBG (last 3)  Recent Labs    07/28/23 1157  GLUCAP 94         IMAGING x48h  - image(s) personally visualized  -   highlighted in bold CT Angio Chest Pulmonary Embolism (PE) W or WO Contrast  Addendum Date: 07/29/2023   ADDENDUM REPORT: 07/29/2023 06:52 ADDENDUM: Bolus timing on the current study is markedly suboptimal for the pulmonary arteries. Within this limitation, the right lower lobe pulmonary embolus is not substantially changed since the 07/27/2023 exam. Pulmonary embolus seen previously in the left upper lobe is not evident on the current exam, likely due to bolus timing. Electronically Signed   By: Kennith Center M.D.   On: 07/29/2023 06:52   Result Date: 07/29/2023 CLINICAL DATA:  Pulmonary embolus on CT yesterday. EXAM: CT ANGIOGRAPHY CHEST WITH CONTRAST TECHNIQUE: Multidetector CT imaging of the chest was performed using the standard protocol during bolus administration of intravenous contrast. Multiplanar  CT image reconstructions and MIPs were obtained to evaluate the vascular anatomy. RADIATION DOSE REDUCTION: This exam was performed according to the departmental dose-optimization program which includes automated exposure control, adjustment of the mA and/or kV according to patient size and/or use of iterative reconstruction technique. CONTRAST:  75mL OMNIPAQUE IOHEXOL 350 MG/ML SOLN COMPARISON:  07/27/2023 FINDINGS: Cardiovascular: The heart size is normal. No substantial pericardial effusion. No thoracic aortic aneurysm. Right Port-A-Cath tip is positioned in the right atrium. Mediastinum/Nodes: 13 mm subcarinal lymph  node is more prominent than on yesterday's study. No left hilar lymphadenopathy. No definite right hilar lymphadenopathy. The esophagus has normal imaging features. There is no axillary lymphadenopathy. Lungs/Pleura: Since yesterday's study, the patient has developed consolidative and ground-glass airspace disease in the right lower lobe with right lower lobe interlobular septal thickening. Given the right lower lobe pulmonary embolus on yesterday's exam, findings likely reflect right lower lobe pulmonary infarct. Subsegmental atelectasis is seen in the right upper lobe and left base although fine detail is obscured by breathing motion. No substantial pleural effusion. Upper Abdomen: Visualized portion of the upper abdomen shows no acute findings. Musculoskeletal: No worrisome lytic or sclerotic osseous abnormality. Review of the MIP images confirms the above findings. IMPRESSION: 1. Since yesterday's study, the patient has developed progressive consolidative and ground-glass airspace disease in the right lower lobe with right lower lobe interlobular septal thickening. Given the right lower lobe pulmonary embolus , findings likely reflect right lower lobe pulmonary infarct. 2. 13 mm subcarinal lymph node is more prominent than on yesterday's study. This is likely reactive. 3. Areas of subsegmental  atelectasis in the lung bases bilaterally. Electronically Signed: By: Kennith Center M.D. On: 07/28/2023 13:43   DG CHEST PORT 1 VIEW  Result Date: 07/28/2023 CLINICAL DATA:  Respiratory distress. EXAM: PORTABLE CHEST 1 VIEW COMPARISON:  07/26/2023.  CT, 07/28/2023 at 12:52 p.m. FINDINGS: Cardiac silhouette normal in size. Stable right anterior chest wall Port-A-Cath. Low lung volumes. Hazy airspace opacity at the right lung base mostly obscures the right hemidiaphragm. Mild opacity noted at the medial left lung base. No pneumothorax. IMPRESSION: 1. Low lung volumes. 2. Right greater than left lung base opacity without convincing change from the CT performed earlier today. Electronically Signed   By: Amie Portland M.D.   On: 07/28/2023 17:25   ECHOCARDIOGRAM LIMITED  Result Date: 07/28/2023    ECHOCARDIOGRAM LIMITED REPORT   Patient Name:   LEJUAN SHELBURN Date of Exam: 07/28/2023 Medical Rec #:  [id number]            Height:       71.0 in Accession #:    [id number]           Weight:       256.0 lb Date of Birth:  [birth year]            BSA:          2.342 m Patient Age:    32 years             BP:           132/76 mmHg Patient Gender: M                    HR:           110 bpm. Exam Location:  Inpatient Procedure: Limited Echo Indications:    RV Strain  History:        Patient has prior history of Echocardiogram examinations, most                 recent 07/27/2023.  Sonographer:    Darlys Gales Referring Phys: 17 Murat Rideout IMPRESSIONS  1. Limited echo with poor quality images.  2. Left ventricular ejection fraction, by estimation, is 55 to 60%. The left ventricle has normal function.  3. Right ventricular systolic function is normal. The right ventricular size is normal.  4. The aortic valve is tricuspid. FINDINGS  Left Ventricle: Left ventricular ejection fraction, by estimation,  is 55 to 60%. The left ventricle has normal function. The left ventricular internal cavity size was normal in size.  Right Ventricle: The right ventricular size is normal. Right vetricular wall thickness was not assessed. Right ventricular systolic function is normal. Pericardium: There is no evidence of pericardial effusion. Tricuspid Valve: The tricuspid valve is not assessed. Aortic Valve: The aortic valve is tricuspid. IAS/Shunts: The interatrial septum was not well visualized. Additional Comments: Limited echo with poor quality images.  RIGHT VENTRICLE RV Basal diam:  3.20 cm RV Mid diam:    3.30 cm RV S prime:     19.70 cm/s TAPSE (M-mode): 2.3 cm Charlton Haws MD Electronically signed by Charlton Haws MD Signature Date/Time: 07/28/2023/3:10:07 PM    Final    VAS [place] LOWER EXTREMITY VENOUS (DVT)  Result Date: 07/27/2023  Lower Venous DVT Study Patient Name:  MYSHON ZWAHLEN  Date of Exam:   07/27/2023 Medical Rec #: [id number]             Accession #:    [id number] Date of Birth: [birth year]             Patient Gender: M Patient Age:   16 years Exam Location:  [facility] Procedure:      VAS [place] LOWER EXTREMITY VENOUS (DVT) Referring Phys: Lyda Perone --------------------------------------------------------------------------------  Indications: Pulmonary embolism.  Risk Factors: Confirmed PE DVT. Anticoagulation: Eliquis. Comparison Study: 06/21/2023 - RIGHT:                   - No evidence of common femoral vein obstruction.                    LEFT:                   - Findings consistent with acute deep vein thrombosis                   involving the left                   popliteal vein, left posterior tibial veins, and left peroneal                   veins.                   - No cystic structure found in the popliteal fossa. Performing Technologist: Chanda Busing RVT  Examination Guidelines: A complete evaluation includes B-mode imaging, spectral Doppler, color Doppler, and power Doppler as needed of all accessible portions of each vessel. Bilateral testing is considered an integral part of a complete  examination. Limited examinations for reoccurring indications may be performed as noted. The reflux portion of the exam is performed with the patient in reverse Trendelenburg.  +---------+---------------+---------+-----------+----------+--------------+ RIGHT    CompressibilityPhasicitySpontaneityPropertiesThrombus Aging +---------+---------------+---------+-----------+----------+--------------+ CFV      Full           Yes      Yes                                 +---------+---------------+---------+-----------+----------+--------------+ SFJ      Full                                                        +---------+---------------+---------+-----------+----------+--------------+  FV Prox  Full                                                        +---------+---------------+---------+-----------+----------+--------------+ FV Mid   Full                                                        +---------+---------------+---------+-----------+----------+--------------+ FV DistalFull                                                        +---------+---------------+---------+-----------+----------+--------------+ PFV      Full                                                        +---------+---------------+---------+-----------+----------+--------------+ POP      Full           Yes      Yes                                 +---------+---------------+---------+-----------+----------+--------------+ PTV      Full                                                        +---------+---------------+---------+-----------+----------+--------------+ PERO     Full                                                        +---------+---------------+---------+-----------+----------+--------------+   +---------+---------------+---------+-----------+----------+--------------+ LEFT     CompressibilityPhasicitySpontaneityPropertiesThrombus Aging  +---------+---------------+---------+-----------+----------+--------------+ CFV      Full           Yes      Yes                                 +---------+---------------+---------+-----------+----------+--------------+ SFJ      Full                                                        +---------+---------------+---------+-----------+----------+--------------+ FV Prox  Full                                                        +---------+---------------+---------+-----------+----------+--------------+  FV Mid   Partial        Yes      Yes                  Acute          +---------+---------------+---------+-----------+----------+--------------+ FV DistalNone           No       No                   Acute          +---------+---------------+---------+-----------+----------+--------------+ PFV      Full                                                        +---------+---------------+---------+-----------+----------+--------------+ POP      None           No       No                   Acute          +---------+---------------+---------+-----------+----------+--------------+ PTV      None                                         Acute          +---------+---------------+---------+-----------+----------+--------------+ PERO     None                                         Acute          +---------+---------------+---------+-----------+----------+--------------+ Soleal   None                                         Acute          +---------+---------------+---------+-----------+----------+--------------+ Gastroc  None                                         Acute          +---------+---------------+---------+-----------+----------+--------------+ SSV      Full                                                        +---------+---------------+---------+-----------+----------+--------------+     Summary: RIGHT: - There is no evidence of  deep vein thrombosis in the lower extremity.  - No cystic structure found in the popliteal fossa.  LEFT: - Findings consistent with acute deep vein thrombosis involving the left femoral vein, left popliteal vein, left posterior tibial veins, and left peroneal veins. Findings consistent with acute intramuscular thrombosis involving the left gastrocnemius veins, and left soleal veins. - No cystic structure found in the popliteal fossa.  *See table(s) above for measurements and observations. Electronically signed by Carolynn Sayers on 07/27/2023 at 2:16:43 PM.  Final    ECHOCARDIOGRAM LIMITED  Result Date: 07/27/2023    ECHOCARDIOGRAM LIMITED REPORT   Patient Name:   KHYAN COLLOM Date of Exam: 07/27/2023 Medical Rec #:  [id number]            Height:       71.0 in Accession #:    [id number]           Weight:       256.0 lb Date of Birth:  1991/08/13            BSA:          2.342 m Patient Age:    32 years             BP:           119/78 mmHg Patient Gender: M                    HR:           87 bpm. Exam Location:  Inpatient Procedure: Limited Echo, Cardiac Doppler and Color Doppler Indications:    I26.02 Pulmonary embolus  History:        Patient has prior history of Echocardiogram examinations, most                 recent 06/23/2023. Signs/Symptoms:Shortness of Breath and                 Dyspnea. Pulmonary embolus. Cancer. Cor Pulmonale.  Sonographer:    Sheralyn Boatman RDCS Referring Phys: [phone] JARED M GARDNER  Sonographer Comments: Technically difficult study due to poor echo windows. Patient in pain, could not turn. IMPRESSIONS  1. Left ventricular ejection fraction, by estimation, is 60 to 65%. The left ventricle has normal function. The left ventricle has no regional wall motion abnormalities.  2. Right ventricular systolic function is normal. The right ventricular size is moderately enlarged. Tricuspid regurgitation signal is inadequate for assessing PA pressure.  3. No evidence of mitral valve  regurgitation.  4. The inferior vena cava is normal in size with greater than 50% respiratory variability, suggesting right atrial pressure of 3 mmHg. Conclusion(s)/Recommendation(s): RV does look more dilated compared to prior study; c/f RV strain. FINDINGS  Left Ventricle: Left ventricular ejection fraction, by estimation, is 60 to 65%. The left ventricle has normal function. The left ventricle has no regional wall motion abnormalities. The left ventricular internal cavity size was normal in size. Right Ventricle: The right ventricular size is moderately enlarged. Right ventricular systolic function is normal. Tricuspid regurgitation signal is inadequate for assessing PA pressure. Right Atrium: Right atrial size was normal in size. Tricuspid Valve: Tricuspid valve regurgitation is not demonstrated. Venous: The inferior vena cava is normal in size with greater than 50% respiratory variability, suggesting right atrial pressure of 3 mmHg. Additional Comments: Spectral Doppler performed. Color Doppler performed.  LEFT VENTRICLE PLAX 2D LVIDd:         4.30 cm LVIDs:         2.90 cm LV PW:         1.00 cm LV IVS:        1.10 cm LVOT diam:     2.40 cm LV SV:         66 LV SV Index:   28 LVOT Area:     4.52 cm  LV Volumes (MOD) LV vol d, MOD A2C: 68.2 ml LV vol d, MOD A4C: 69.0 ml LV vol s, MOD A2C: 21.6 ml  LV vol s, MOD A4C: 21.8 ml LV SV MOD A2C:     46.6 ml LV SV MOD A4C:     69.0 ml LV SV MOD BP:      47.7 ml RIGHT VENTRICLE             IVC RV S prime:     10.90 cm/s  IVC diam: 1.90 cm TAPSE (M-mode): 2.0 cm RIGHT ATRIUM           Index RA Area:     14.90 cm RA Volume:   40.20 ml  17.17 ml/m  AORTIC VALVE LVOT Vmax:   102.00 cm/s LVOT Vmean:  64.600 cm/s LVOT VTI:    0.145 m  AORTA Ao Asc diam: 3.30 cm MITRAL VALVE MV Area (PHT): 4.60 cm    SHUNTS MV Decel Time: 165 msec    Systemic VTI:  0.14 m MV E velocity: 82.40 cm/s  Systemic Diam: 2.40 cm MV A velocity: 78.40 cm/s MV E/A ratio:  1.05 Actuary signed by Carolan Clines Signature Date/Time: 07/27/2023/12:17:48 PM    Final

## 2023-07-29 NOTE — Progress Notes (Signed)
PROGRESS NOTE    Steve Stewart  ZOX:[id number] DOB: October 05, 1990 DOA: 07/26/2023 PCP: Patient, No Pcp Per   Brief Narrative:  HPI: Steve Stewart is a 32 y.o. male with medical history significant of seminoma s/p orchiectomy, acute PE diagnosed Nov 1st 2024 with LLE DVT, HIV on HAART.  Pt currently on chemo for seminoma, just had bleomycin x3 days ago.  Pt had been on eliquis for DVT/PE but this was put on hold this past week due to severe thrombocytopenia with platelets in the 20s.  Pt had complete resolution of PE on repeat CT scan x1 week ago.   Pt in to ED: earlier today began having acute onset of pain behind L knee and near R shoulder blade.  Pain is pleuritic, similar to prior PE.  Associated SOB.  No melena nor hematochezia.   Work up in ED demonstrates acute PE with RHS on CT scan.   Prior thrombocytopenia is improved with platelets currently 135.   Pt reports he also has recurrent pain behind his L knee, same location as DVT last month.    Assessment & Plan:   Principal Problem:   Acute pulmonary embolism with acute cor pulmonale (HCC) Active Problems:   Primary seminoma of left testis (HCC)   HIV disease (HCC)   Antineoplastic chemotherapy induced pancytopenia (HCC)   Pulmonary embolism (HCC)   Acute respiratory failure with hypoxia (HCC)  Acute respiratory failure with hypoxia secondary to acute submassive PE/extensive left lower extremity DVT/pulmonary infarct: CTA chest showed right heart strain, Doppler lower extremity confirmed left lower extremity extensive DVT, worsened from previously.  Started on Eliquis after consultation with oncology.  Echo also showed RV strain but mild.  Patient was doing well until the morning of 2924 when he had significantly worsening pain causing acute hypoxic respiratory failure with tachypnea and tachycardia. I consulted PCCM, spoke to Dr. Marchelle Gearing, patient was transferred to stepdown. He recommended consulting with IR for  possible catheter directed thrombolytics.  I spoke to Dr. Tiburcio Bash of IR and he reviewed CTA personally and opined that he did not agree with the initial reading by radiologist about submassive PE, per him, he can only see subsegmental PE which should not cause patient's symptoms.  He recommended repeating stat CTA to see if his PE has now worsened.  However on the repeat CTA, bolus timing was markedly suboptimal for the pulmonary arteries.  And per radiology report, within this limitation, the right lower lobe pulmonary embolus is not substantially changed since the 07/27/2023 exam. Pulmonary embolus seen previously in the left upper lobe is not evident on the current exam, likely due to bolus timing.  Stat echo was also done which did not show significant right heart strain, and troponins were negative as well essentially ruling out submassive PE and any indication of thrombolytics.  Patient's symptoms were likely worse due to pulmonary infarct.  He was transition from Eliquis to heparin.  Due to uncontrolled pain, he was started on Dilaudid PCA by PCCM.  Patient's pain is much better today, he is able to take deep breaths, he is currently on 5 L of oxygen which we will wean as able to.  PCCM is helping as well.  We appreciate their help.  Pancytopenia: Likely secondary to chemo.  Monitor.  Improving.  HIV disease (HCC) Continue HAART   Primary seminoma of left testis (HCC) Pt on chemo with heme/onc including bleomycin. Most recently got chemo on 12/3.  Oncology following.  DVT prophylaxis: Eliquis  Code Status: Full Code  Family Communication:  None present at bedside.  Plan of care discussed with patient in length and he/she verbalized understanding and agreed with it.  Status is: Inpatient Remains inpatient appropriate because: Requiring 5 L of oxygen.  Pain improving but not completely resolved.  Estimated body mass index is 32.1 kg/m as calculated from the following:   Height as of this  encounter: 5\' 11"  (1.803 m).   Weight as of this encounter: 104.4 kg.    Nutritional Assessment: Body mass index is 32.1 kg/m.Marland Kitchen Seen by dietician.  I agree with the assessment and plan as outlined below: Nutrition Status:        . Skin Assessment: I have examined the patient's skin and I agree with the wound assessment as performed by the wound care RN as outlined below:    Consultants:  Oncology  Procedures:  None  Antimicrobials:  Anti-infectives (From admission, onward)    Start     Dose/Rate Route Frequency Ordered Stop   07/27/23 1000  bictegravir-emtricitabine-tenofovir AF (BIKTARVY) 50-200-25 MG per tablet 1 tablet        1 tablet Oral Daily 07/27/23 0425           Subjective: Seen and examined.  He says that his chest pain is much better than yesterday.  He is able to take deep breaths today.  Improving shortness of breath.  No other complaint.  Objective: Vitals:   07/29/23 0800 07/29/23 0900 07/29/23 1039 07/29/23 1041  BP: 137/72 132/67    Pulse: (!) 108  (!) 101   Resp: (!) 27 (!) 21 (!) 21   Temp:      TempSrc:      SpO2: 97%   99%  Weight:      Height:        Intake/Output Summary (Last 24 hours) at 07/29/2023 1138 Last data filed at 07/29/2023 0630 Gross per 24 hour  Intake 1344.44 ml  Output 800 ml  Net 544.44 ml   Filed Weights   07/26/23 2241 07/28/23 1620  Weight: 116.1 kg 104.4 kg    Examination:  General exam: Appears calm and comfortable  Respiratory system: Clear to auscultation. Respiratory effort normal. Cardiovascular system: S1 & S2 heard, RRR. No JVD, murmurs, rubs, gallops or clicks. No pedal edema. Gastrointestinal system: Abdomen is nondistended, soft and nontender. No organomegaly or masses felt. Normal bowel sounds heard. Central nervous system: Alert and oriented. No focal neurological deficits. Extremities: Symmetric 5 x 5 power. Skin: No rashes, lesions or ulcers.  Psychiatry: Judgement and insight appear  normal. Mood & affect appropriate.   Data Reviewed: I have personally reviewed following labs and imaging studies  CBC: Recent Labs  Lab 07/24/23 0759 07/26/23 2253 07/27/23 0532 07/29/23 0616  WBC 1.1* 2.2* 2.4* 10.1  NEUTROABS  --  0.5*  --   --   HGB 11.3* 11.9* 11.4* 10.9*  HCT 33.0* 35.6* 33.6* 32.1*  MCV 84.0 84.8 84.2 85.4  PLT 47* 135* 140* 264   Basic Metabolic Panel: Recent Labs  Lab 07/24/23 0759 07/26/23 2253 07/27/23 0532 07/29/23 0616  NA 138 139 135 128*  K 3.6 3.8 4.3 4.4  CL 105 105 104 92*  CO2 27 25 22 26   GLUCOSE 111* 83 107* 107*  BUN 13 18 15 14   CREATININE 0.89 0.93 0.94 0.99  CALCIUM 8.5* 8.8* 8.8* 8.6*  MG  --   --   --  2.1  PHOS  --   --   --  3.8   GFR: Estimated Creatinine Clearance: 129.2 mL/min (by C-G formula based on SCr of 0.99 mg/dL). Liver Function Tests: Recent Labs  Lab 07/24/23 0759 07/29/23 0616  AST 13* 13*  ALT 28 23  ALKPHOS 53 70  BILITOT 0.3 0.4  PROT 6.1* 7.3  ALBUMIN 3.6 3.0*   No results for input(s): "LIPASE", "AMYLASE" in the last 168 hours. No results for input(s): "AMMONIA" in the last 168 hours. Coagulation Profile: No results for input(s): "INR", "PROTIME" in the last 168 hours. Cardiac Enzymes: No results for input(s): "CKTOTAL", "CKMB", "CKMBINDEX", "TROPONINI" in the last 168 hours. BNP (last 3 results) No results for input(s): "PROBNP" in the last 8760 hours. HbA1C: No results for input(s): "HGBA1C" in the last 72 hours. CBG: Recent Labs  Lab 07/28/23 1157  GLUCAP 94   Lipid Profile: No results for input(s): "CHOL", "HDL", "LDLCALC", "TRIG", "CHOLHDL", "LDLDIRECT" in the last 72 hours. Thyroid Function Tests: No results for input(s): "TSH", "T4TOTAL", "FREET4", "T3FREE", "THYROIDAB" in the last 72 hours. Anemia Panel: No results for input(s): "VITAMINB12", "FOLATE", "FERRITIN", "TIBC", "IRON", "RETICCTPCT" in the last 72 hours. Sepsis Labs: Recent Labs  Lab 07/28/23 1438 07/29/23 0616   PROCALCITON 0.12 0.33  LATICACIDVEN 1.3  --     Recent Results (from the past 240 hour(s))  MRSA Next Gen by PCR, Nasal     Status: None   Collection Time: 07/28/23  4:20 PM   Specimen: Nasal Mucosa; Nasal Swab  Result Value Ref Range Status   MRSA by PCR Next Gen NOT DETECTED NOT DETECTED Final    Comment: (NOTE) The GeneXpert MRSA Assay (FDA approved for NASAL specimens only), is one component of a comprehensive MRSA colonization surveillance program. It is not intended to diagnose MRSA infection nor to guide or monitor treatment for MRSA infections. Test performance is not FDA approved in patients less than 11 years old. Performed at [facility], [address], [place], [place] [address]      Radiology Studies: CT Angio Chest Pulmonary Embolism (PE) W or WO Contrast  Addendum Date: 07/29/2023   ADDENDUM REPORT: 07/29/2023 06:52 ADDENDUM: Bolus timing on the current study is markedly suboptimal for the pulmonary arteries. Within this limitation, the right lower lobe pulmonary embolus is not substantially changed since the 07/27/2023 exam. Pulmonary embolus seen previously in the left upper lobe is not evident on the current exam, likely due to bolus timing. Electronically Signed   By: Kennith Center M.D.   On: 07/29/2023 06:52   Result Date: 07/29/2023 CLINICAL DATA:  Pulmonary embolus on CT yesterday. EXAM: CT ANGIOGRAPHY CHEST WITH CONTRAST TECHNIQUE: Multidetector CT imaging of the chest was performed using the standard protocol during bolus administration of intravenous contrast. Multiplanar CT image reconstructions and MIPs were obtained to evaluate the vascular anatomy. RADIATION DOSE REDUCTION: This exam was performed according to the departmental dose-optimization program which includes automated exposure control, adjustment of the mA and/or kV according to patient size and/or use of iterative reconstruction technique. CONTRAST:  75mL OMNIPAQUE IOHEXOL 350  MG/ML SOLN COMPARISON:  07/27/2023 FINDINGS: Cardiovascular: The heart size is normal. No substantial pericardial effusion. No thoracic aortic aneurysm. Right Port-A-Cath tip is positioned in the right atrium. Mediastinum/Nodes: 13 mm subcarinal lymph node is more prominent than on yesterday's study. No left hilar lymphadenopathy. No definite right hilar lymphadenopathy. The esophagus has normal imaging features. There is no axillary lymphadenopathy. Lungs/Pleura: Since yesterday's study, the patient has developed consolidative and ground-glass airspace disease in the right  lower lobe with right lower lobe interlobular septal thickening. Given the right lower lobe pulmonary embolus on yesterday's exam, findings likely reflect right lower lobe pulmonary infarct. Subsegmental atelectasis is seen in the right upper lobe and left base although fine detail is obscured by breathing motion. No substantial pleural effusion. Upper Abdomen: Visualized portion of the upper abdomen shows no acute findings. Musculoskeletal: No worrisome lytic or sclerotic osseous abnormality. Review of the MIP images confirms the above findings. IMPRESSION: 1. Since yesterday's study, the patient has developed progressive consolidative and ground-glass airspace disease in the right lower lobe with right lower lobe interlobular septal thickening. Given the right lower lobe pulmonary embolus , findings likely reflect right lower lobe pulmonary infarct. 2. 13 mm subcarinal lymph node is more prominent than on yesterday's study. This is likely reactive. 3. Areas of subsegmental atelectasis in the lung bases bilaterally. Electronically Signed: By: Kennith Center M.D. On: 07/28/2023 13:43   DG CHEST PORT 1 VIEW  Result Date: 07/28/2023 CLINICAL DATA:  Respiratory distress. EXAM: PORTABLE CHEST 1 VIEW COMPARISON:  07/26/2023.  CT, 07/28/2023 at 12:52 p.m. FINDINGS: Cardiac silhouette normal in size. Stable right anterior chest wall Port-A-Cath. Low  lung volumes. Hazy airspace opacity at the right lung base mostly obscures the right hemidiaphragm. Mild opacity noted at the medial left lung base. No pneumothorax. IMPRESSION: 1. Low lung volumes. 2. Right greater than left lung base opacity without convincing change from the CT performed earlier today. Electronically Signed   By: Amie Portland M.D.   On: 07/28/2023 17:25   ECHOCARDIOGRAM LIMITED  Result Date: 07/28/2023    ECHOCARDIOGRAM LIMITED REPORT   Patient Name:   Steve Stewart Date of Exam: 07/28/2023 Medical Rec #:  [id number]            Height:       71.0 in Accession #:    [id number]           Weight:       256.0 lb Date of Birth:  [birth year]            BSA:          2.342 m Patient Age:    32 years             BP:           132/76 mmHg Patient Gender: M                    HR:           110 bpm. Exam Location:  Inpatient Procedure: Limited Echo Indications:    RV Strain  History:        Patient has prior history of Echocardiogram examinations, most                 recent 07/27/2023.  Sonographer:    Darlys Gales Referring Phys: [id number] MURALI RAMASWAMY IMPRESSIONS  1. Limited echo with poor quality images.  2. Left ventricular ejection fraction, by estimation, is 55 to 60%. The left ventricle has normal function.  3. Right ventricular systolic function is normal. The right ventricular size is normal.  4. The aortic valve is tricuspid. FINDINGS  Left Ventricle: Left ventricular ejection fraction, by estimation, is 55 to 60%. The left ventricle has normal function. The left ventricular internal cavity size was normal in size. Right Ventricle: The right ventricular size is normal. Right vetricular wall thickness was not assessed. Right ventricular systolic function is normal. Pericardium: There is  no evidence of pericardial effusion. Tricuspid Valve: The tricuspid valve is not assessed. Aortic Valve: The aortic valve is tricuspid. IAS/Shunts: The interatrial septum was not well visualized. Additional  Comments: Limited echo with poor quality images.  RIGHT VENTRICLE RV Basal diam:  3.20 cm RV Mid diam:    3.30 cm RV S prime:     19.70 cm/s TAPSE (M-mode): 2.3 cm Charlton Haws MD Electronically signed by Charlton Haws MD Signature Date/Time: 07/28/2023/3:10:07 PM    Final    ECHOCARDIOGRAM LIMITED  Result Date: 07/27/2023    ECHOCARDIOGRAM LIMITED REPORT   Patient Name:   Steve Stewart Date of Exam: 07/27/2023 Medical Rec #:  [id number]            Height:       71.0 in Accession #:    [id number]           Weight:       256.0 lb Date of Birth:  1991/05/24            BSA:          2.342 m Patient Age:    32 years             BP:           119/78 mmHg Patient Gender: M                    HR:           87 bpm. Exam Location:  Inpatient Procedure: Limited Echo, Cardiac Doppler and Color Doppler Indications:    I26.02 Pulmonary embolus  History:        Patient has prior history of Echocardiogram examinations, most                 recent 06/23/2023. Signs/Symptoms:Shortness of Breath and                 Dyspnea. Pulmonary embolus. Cancer. Cor Pulmonale.  Sonographer:    Sheralyn Boatman RDCS Referring Phys: [id number] JARED M GARDNER  Sonographer Comments: Technically difficult study due to poor echo windows. Patient in pain, could not turn. IMPRESSIONS  1. Left ventricular ejection fraction, by estimation, is 60 to 65%. The left ventricle has normal function. The left ventricle has no regional wall motion abnormalities.  2. Right ventricular systolic function is normal. The right ventricular size is moderately enlarged. Tricuspid regurgitation signal is inadequate for assessing PA pressure.  3. No evidence of mitral valve regurgitation.  4. The inferior vena cava is normal in size with greater than 50% respiratory variability, suggesting right atrial pressure of 3 mmHg. Conclusion(s)/Recommendation(s): RV does look more dilated compared to prior study; c/f RV strain. FINDINGS  Left Ventricle: Left ventricular ejection fraction,  by estimation, is 60 to 65%. The left ventricle has normal function. The left ventricle has no regional wall motion abnormalities. The left ventricular internal cavity size was normal in size. Right Ventricle: The right ventricular size is moderately enlarged. Right ventricular systolic function is normal. Tricuspid regurgitation signal is inadequate for assessing PA pressure. Right Atrium: Right atrial size was normal in size. Tricuspid Valve: Tricuspid valve regurgitation is not demonstrated. Venous: The inferior vena cava is normal in size with greater than 50% respiratory variability, suggesting right atrial pressure of 3 mmHg. Additional Comments: Spectral Doppler performed. Color Doppler performed.  LEFT VENTRICLE PLAX 2D LVIDd:         4.30 cm LVIDs:  2.90 cm LV PW:         1.00 cm LV IVS:        1.10 cm LVOT diam:     2.40 cm LV SV:         66 LV SV Index:   28 LVOT Area:     4.52 cm  LV Volumes (MOD) LV vol d, MOD A2C: 68.2 ml LV vol d, MOD A4C: 69.0 ml LV vol s, MOD A2C: 21.6 ml LV vol s, MOD A4C: 21.8 ml LV SV MOD A2C:     46.6 ml LV SV MOD A4C:     69.0 ml LV SV MOD BP:      47.7 ml RIGHT VENTRICLE             IVC RV S prime:     10.90 cm/s  IVC diam: 1.90 cm TAPSE (M-mode): 2.0 cm RIGHT ATRIUM           Index RA Area:     14.90 cm RA Volume:   40.20 ml  17.17 ml/m  AORTIC VALVE LVOT Vmax:   102.00 cm/s LVOT Vmean:  64.600 cm/s LVOT VTI:    0.145 m  AORTA Ao Asc diam: 3.30 cm MITRAL VALVE MV Area (PHT): 4.60 cm    SHUNTS MV Decel Time: 165 msec    Systemic VTI:  0.14 m MV E velocity: 82.40 cm/s  Systemic Diam: 2.40 cm MV A velocity: 78.40 cm/s MV E/A ratio:  1.05 Photographer signed by Carolan Clines Signature Date/Time: 07/27/2023/12:17:48 PM    Final     Scheduled Meds:  bictegravir-emtricitabine-tenofovir AF  1 tablet Oral Daily   Chlorhexidine Gluconate Cloth  6 each Topical Daily   famotidine  20 mg Oral BID   feeding supplement  237 mL Oral BID BM   HYDROmorphone    Intravenous Q4H   oxyCODONE  10 mg Oral Q12H   traZODone  50 mg Oral QHS   Continuous Infusions:  heparin 2,000 Units/hr (07/29/23 0920)     LOS: 2 days   Hughie Closs, MD [facility]  07/29/2023, 11:38 AM   *Please note that this is a verbal dictation therefore any spelling or grammatical errors are due to the "Dragon Medical One" system interpretation.  Please page via Amion and do not message via secure chat for urgent patient care matters. Secure chat can be used for non urgent patient care matters.  How to contact the [facility] Attending or Consulting provider 7A - 7P or covering provider during after hours 7P -7A, for this patient?  Check the care team in [facility] and look for a) attending/consulting [facility] provider listed and b) the [facility] team listed. Page or secure chat 7A-7P. Log into [email or web address] and use [facility] universal password to access. If you do not have the password, please contact the hospital operator. Locate the [facility] provider you are looking for under [facility] and page to a number that you can be directly reached. If you still have difficulty reaching the provider, please page the [facility] (Director on Call) for the Hospitalists listed on amion for assistance.

## 2023-07-29 NOTE — Progress Notes (Signed)
PHARMACY - ANTICOAGULATION CONSULT NOTE  Pharmacy Consult for IV heparin Indication: recurrent PE  No Known Allergies  Patient Measurements: Height: 5\' 11"  (180.3 cm) Weight: 104.4 kg (230 lb 2.6 oz) IBW/kg (Calculated) : 75.3 Heparin Dosing Weight: 100 kg  Vital Signs: Temp: 98.2 F (36.8 C) (12/08 0400) Temp Source: Oral (12/08 0400) BP: 107/77 (12/08 0700) Pulse Rate: 106 (12/08 0700)  Labs: Recent Labs    07/26/23 2253 07/27/23 0532 07/28/23 1438 07/28/23 2356 07/29/23 0616  HGB 11.9* 11.4*  --   --  10.9*  HCT 35.6* 33.6*  --   --  32.1*  PLT 135* 140*  --   --  264  APTT  --   --   --  74* 58*  HEPARINUNFRC  --   --   --   --  >1.10*  CREATININE 0.93 0.94  --   --  0.99  TROPONINIHS 2  --  <2  --  3    Estimated Creatinine Clearance: 129.2 mL/min (by C-G formula based on SCr of 0.99 mg/dL).   Medical History: Past Medical History:  Diagnosis Date   HIV infection (HCC)    Medical history non-contributory    Testicular cancer (HCC)     Medications:  Medications Prior to Admission  Medication Sig Dispense Refill Last Dose   acetaminophen (TYLENOL) 650 MG CR tablet Take 1,300 mg by mouth every 8 (eight) hours as needed for pain.   07/26/2023   albuterol (VENTOLIN HFA) 108 (90 Base) MCG/ACT inhaler Inhale 1 puff into the lungs every 6 (six) hours as needed for wheezing or shortness of breath.   Past Month   bictegravir-emtricitabine-tenofovir AF (BIKTARVY) 50-200-25 MG TABS tablet Take 1 tablet by mouth daily. 30 tablet 2 07/26/2023   dexamethasone (DECADRON) 4 MG tablet Take 2 tablets (8 mg total) by mouth daily. Start the day after last cisplatin dose on day 6 of chemo x 3 days.Take with food. 30 tablet 1 unknown   famotidine (PEPCID) 20 MG tablet Take 1 tablet (20 mg total) by mouth 2 (two) times daily. 30 tablet 2 07/26/2023   lidocaine (XYLOCAINE) 2 % solution Use as directed 15 mLs in the mouth or throat every 3 (three) hours as needed for mouth pain. Mix 1  to 1 ratio with Maalox to rinse for pain 100 mL 0 unknown   lidocaine-prilocaine (EMLA) cream Apply to affected area once 30 g 3 Past Week   prochlorperazine (COMPAZINE) 10 MG tablet Take 1 tablet (10 mg total) by mouth every 6 (six) hours as needed for nausea or vomiting. 30 tablet 1 07/26/2023   traZODone (DESYREL) 50 MG tablet Take 1 tablet (50 mg total) by mouth at bedtime. 90 tablet 1 07/26/2023   apixaban (ELIQUIS) 5 MG TABS tablet Take 5 mg by mouth 2 (two) times daily. ON HOLD as of 07/24/23   07/17/2023   ondansetron (ZOFRAN) 8 MG tablet Take 1 tablet (8 mg total) by mouth every 8 (eight) hours as needed for nausea or vomiting. Start on the third day after last cisplatin dose on day 8 of chemo (Patient not taking: Reported on 07/27/2023) 30 tablet 1 Not Taking   Scheduled:   bictegravir-emtricitabine-tenofovir AF  1 tablet Oral Daily   Chlorhexidine Gluconate Cloth  6 each Topical Daily   famotidine  20 mg Oral BID   feeding supplement  237 mL Oral BID BM   heparin  1,000 Units Intravenous Once   HYDROmorphone   Intravenous Q4H  oxyCODONE  10 mg Oral Q12H   traZODone  50 mg Oral QHS   PRN: acetaminophen **OR** acetaminophen, albuterol, ALPRAZolam, diphenhydrAMINE **OR** diphenhydrAMINE, magic mouthwash w/lidocaine, naloxone **AND** sodium chloride flush, ondansetron (ZOFRAN) IV, mouth rinse  Assessment: 23 yoM with PMH testicular cancer on cytotoxic chemo (last received 11/18), HIV, recent PE/DVT diagnosed 11/1 which had resolved on CT scan from 11/29, now returns with new bilateral submassive PE with RH strain noted on CT (Eliquis had been on hold the past few weeks d/t chemo-induced thrombocytopenia, with Plt nadir 22k). Initially started on Eliquis 10 mg BID, but today PCCM MD concerned with clinical deterioration/possible need for thrombolysis, and Pharmacy requested to transition patient to IV heparin infusion.  Prior anticoagulation: Eliquis 10 mg PO bid; LD 12/7 @  0900  Significant events:  Today, 07/29/2023: 06:16 aPTT 58 sec- subtherapeutic on IV heparin 1800 units/hr Heparin level elevated at >1.10 as expected with apixaban on board CBC: Hgb slightly low but stable; Plt improved since chemo, now WNL No bleeding or infusion issues per nursing  Goal of Therapy: Heparin level 0.3-0.7 units/ml aPTT 66-102 sec Monitor platelets by anticoagulation protocol: Yes  Plan: Bolus IV heparin 1000 units once via infusion and increase IV Heparin continuous infusion to 2000 units/hr  Check 6 hr aPTT following rate increase Daily CBC and heparin level; will need to dose heparin from aPTT until DOAC effects have dissipated Monitor for signs of bleeding or thrombosis    Thank you for allowing pharmacy to be a part of this patient's care.  Selinda Eon, PharmD, BCPS Clinical Pharmacist [place] 07/29/2023 9:03 AM

## 2023-07-30 ENCOUNTER — Inpatient Hospital Stay: Payer: Medicaid Other

## 2023-07-30 DIAGNOSIS — E871 Hypo-osmolality and hyponatremia: Secondary | ICD-10-CM

## 2023-07-30 DIAGNOSIS — B20 Human immunodeficiency virus [HIV] disease: Secondary | ICD-10-CM | POA: Diagnosis not present

## 2023-07-30 DIAGNOSIS — I824Y2 Acute embolism and thrombosis of unspecified deep veins of left proximal lower extremity: Secondary | ICD-10-CM

## 2023-07-30 DIAGNOSIS — C6292 Malignant neoplasm of left testis, unspecified whether descended or undescended: Secondary | ICD-10-CM | POA: Diagnosis not present

## 2023-07-30 DIAGNOSIS — D61810 Antineoplastic chemotherapy induced pancytopenia: Secondary | ICD-10-CM | POA: Diagnosis not present

## 2023-07-30 DIAGNOSIS — J9601 Acute respiratory failure with hypoxia: Secondary | ICD-10-CM | POA: Diagnosis not present

## 2023-07-30 DIAGNOSIS — I2609 Other pulmonary embolism with acute cor pulmonale: Secondary | ICD-10-CM | POA: Diagnosis not present

## 2023-07-30 DIAGNOSIS — Z86718 Personal history of other venous thrombosis and embolism: Secondary | ICD-10-CM

## 2023-07-30 DIAGNOSIS — I82409 Acute embolism and thrombosis of unspecified deep veins of unspecified lower extremity: Secondary | ICD-10-CM

## 2023-07-30 DIAGNOSIS — I2699 Other pulmonary embolism without acute cor pulmonale: Secondary | ICD-10-CM | POA: Diagnosis not present

## 2023-07-30 LAB — CBC
HCT: 28 % — ABNORMAL LOW (ref 39.0–52.0)
Hemoglobin: 9.4 g/dL — ABNORMAL LOW (ref 13.0–17.0)
MCH: 28.7 pg (ref 26.0–34.0)
MCHC: 33.6 g/dL (ref 30.0–36.0)
MCV: 85.4 fL (ref 80.0–100.0)
Platelets: 240 10*3/uL (ref 150–400)
RBC: 3.28 MIL/uL — ABNORMAL LOW (ref 4.22–5.81)
RDW: 14.3 % (ref 11.5–15.5)
WBC: 7 10*3/uL (ref 4.0–10.5)
nRBC: 0 % (ref 0.0–0.2)

## 2023-07-30 LAB — COMPREHENSIVE METABOLIC PANEL
ALT: 21 U/L (ref 0–44)
AST: 16 U/L (ref 15–41)
Albumin: 2.6 g/dL — ABNORMAL LOW (ref 3.5–5.0)
Alkaline Phosphatase: 63 U/L (ref 38–126)
Anion gap: 9 (ref 5–15)
BUN: 14 mg/dL (ref 6–20)
CO2: 26 mmol/L (ref 22–32)
Calcium: 7.8 mg/dL — ABNORMAL LOW (ref 8.9–10.3)
Chloride: 89 mmol/L — ABNORMAL LOW (ref 98–111)
Creatinine, Ser: 0.67 mg/dL (ref 0.61–1.24)
GFR, Estimated: >60 mL/min (ref >60)
Glucose, Bld: 114 mg/dL — ABNORMAL HIGH (ref 70–99)
Potassium: 3.6 mmol/L (ref 3.5–5.1)
Sodium: 124 mmol/L — ABNORMAL LOW (ref 135–145)
Total Bilirubin: 0.4 mg/dL (ref <1.2)
Total Protein: 6.8 g/dL (ref 6.5–8.1)

## 2023-07-30 LAB — HEPARIN LEVEL (UNFRACTIONATED): Heparin Unfractionated: 0.97 [IU]/mL — ABNORMAL HIGH (ref 0.30–0.70)

## 2023-07-30 LAB — APTT
aPTT: 74 s — ABNORMAL HIGH (ref 24–36)
aPTT: 48 s — ABNORMAL HIGH (ref 24–36)
aPTT: 50 s — ABNORMAL HIGH (ref 24–36)

## 2023-07-30 LAB — MAGNESIUM: Magnesium: 2.3 mg/dL (ref 1.7–2.4)

## 2023-07-30 LAB — PHOSPHORUS: Phosphorus: 3.6 mg/dL (ref 2.5–4.6)

## 2023-07-30 LAB — TROPONIN I (HIGH SENSITIVITY): Troponin I (High Sensitivity): 2 ng/L (ref <18)

## 2023-07-30 LAB — BRAIN NATRIURETIC PEPTIDE: B Natriuretic Peptide: 25.8 pg/mL (ref 0.0–100.0)

## 2023-07-30 LAB — URIC ACID: Uric Acid, Serum: 4.4 mg/dL (ref 3.7–8.6)

## 2023-07-30 LAB — LACTIC ACID, PLASMA: Lactic Acid, Venous: 0.5 mmol/L (ref 0.5–1.9)

## 2023-07-30 MED ORDER — SODIUM CHLORIDE 0.9% FLUSH
10.0000 mL | Freq: Two times a day (BID) | INTRAVENOUS | Status: DC
Start: 2023-07-30 — End: 2023-08-02
  Administered 2023-07-30 – 2023-08-01 (×6): 10 mL

## 2023-07-30 MED ORDER — HEPARIN BOLUS VIA INFUSION
3000.0000 [IU] | Freq: Once | INTRAVENOUS | Status: AC
  Administered 2023-07-30: 3000 [IU] via INTRAVENOUS
  Filled 2023-07-30: qty 3000

## 2023-07-30 MED ORDER — HEPARIN (PORCINE) 25000 UT/250ML-% IV SOLN
2900.0000 [IU]/h | INTRAVENOUS | Status: DC
  Administered 2023-07-30: 2600 [IU]/h via INTRAVENOUS
  Administered 2023-07-31 – 2023-08-01 (×4): 2900 [IU]/h via INTRAVENOUS
  Filled 2023-07-30 (×5): qty 250

## 2023-07-30 MED ORDER — SODIUM CHLORIDE 0.9% FLUSH: 10.0000 mL | INTRAVENOUS | Status: DC | PRN

## 2023-07-30 NOTE — Assessment & Plan Note (Signed)
-   Duplex performed 07/27/2023: "Findings consistent with acute deep vein thrombosis involving the left femoral vein, left popliteal vein, left posterior tibial veins, and left peroneal veins. Findings consistent with acute intramuscular thrombosis involving the left gastrocnemius veins, and left soleal veins" - see PE workup

## 2023-07-30 NOTE — Hospital Course (Signed)
Steve Stewart is a 32 y.o. 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  Pt currently on bleomycin for seminoma (cycle 1 completed 11/18 - 12/3). Pt had been on eliquis for DVT/PE but this was put on hold this past week due to severe thrombocytopenia with platelets in the 20s.   Repeat CT angio chest performed 07/20/2023 and showed resolution of previously seen right sided pulmonary emboli. He again underwent CT angio chest on 07/27/2023 on admission which showed bilateral pulmonary emboli right greater than left and peripheral groundglass opacities in the right lower lobe favored as pulmonary infarcts. Lower extremity duplex also revealed left lower extremity acute DVT involving multiple veins. Echo was performed on 07/27/2023 which showed normal RV function with moderately enlarged right ventricle.  Echo was again repeated on 07/28/2023 which showed normal RV function and normal RV size at that time. He has been followed by pulmonology as well. He has been on a heparin drip with plans for transitioning to Eliquis on 07/31/2023.

## 2023-07-30 NOTE — Progress Notes (Signed)
PHARMACY - ANTICOAGULATION CONSULT NOTE  Pharmacy Consult for IV heparin Indication: recurrent PE  No Known Allergies  Patient Measurements: Height: 5\' 11"  (180.3 cm) Weight: 104.4 kg (230 lb 2.6 oz) IBW/kg (Calculated) : 75.3 Heparin Dosing Weight: 100 kg  Vital Signs: Temp: 98.3 F (36.8 C) (12/09 1600) Temp Source: Oral (12/09 1600) BP: 145/77 (12/09 1600) Pulse Rate: 126 (12/09 1600)  Labs: Recent Labs    07/28/23 1438 07/28/23 2356 07/29/23 0616 07/29/23 1626 07/30/23 0402 07/30/23 1000 07/30/23 1716  HGB  --   --  10.9*  --  9.4*  --   --   HCT  --   --  32.1*  --  28.0*  --   --   PLT  --   --  264  --  240  --   --   APTT  --    < > 58*   < > 74* 48* 50*  HEPARINUNFRC  --   --  >1.10*  --  0.97*  --   --   CREATININE  --   --  0.99  --  0.67  --   --   TROPONINIHS <2  --  3  --  2  --   --    < > = values in this interval not displayed.    Estimated Creatinine Clearance: 159.9 mL/min (by C-G formula based on SCr of 0.67 mg/dL).   Medical History: Past Medical History:  Diagnosis Date   HIV infection (HCC)    Medical history non-contributory    Testicular cancer (HCC)     Medications:  Medications Prior to Admission  Medication Sig Dispense Refill Last Dose   acetaminophen (TYLENOL) 650 MG CR tablet Take 1,300 mg by mouth every 8 (eight) hours as needed for pain.   07/26/2023   albuterol (VENTOLIN HFA) 108 (90 Base) MCG/ACT inhaler Inhale 1 puff into the lungs every 6 (six) hours as needed for wheezing or shortness of breath.   Past Month   bictegravir-emtricitabine-tenofovir AF (BIKTARVY) 50-200-25 MG TABS tablet Take 1 tablet by mouth daily. 30 tablet 2 07/26/2023   dexamethasone (DECADRON) 4 MG tablet Take 2 tablets (8 mg total) by mouth daily. Start the day after last cisplatin dose on day 6 of chemo x 3 days.Take with food. 30 tablet 1 unknown   famotidine (PEPCID) 20 MG tablet Take 1 tablet (20 mg total) by mouth 2 (two) times daily. 30 tablet 2  07/26/2023   lidocaine (XYLOCAINE) 2 % solution Use as directed 15 mLs in the mouth or throat every 3 (three) hours as needed for mouth pain. Mix 1 to 1 ratio with Maalox to rinse for pain 100 mL 0 unknown   lidocaine-prilocaine (EMLA) cream Apply to affected area once 30 g 3 Past Week   prochlorperazine (COMPAZINE) 10 MG tablet Take 1 tablet (10 mg total) by mouth every 6 (six) hours as needed for nausea or vomiting. 30 tablet 1 07/26/2023   traZODone (DESYREL) 50 MG tablet Take 1 tablet (50 mg total) by mouth at bedtime. 90 tablet 1 07/26/2023   apixaban (ELIQUIS) 5 MG TABS tablet Take 5 mg by mouth 2 (two) times daily. ON HOLD as of 07/24/23   07/17/2023   ondansetron (ZOFRAN) 8 MG tablet Take 1 tablet (8 mg total) by mouth every 8 (eight) hours as needed for nausea or vomiting. Start on the third day after last cisplatin dose on day 8 of chemo (Patient not taking: Reported on 07/27/2023)  30 tablet 1 Not Taking   Scheduled:   bictegravir-emtricitabine-tenofovir AF  1 tablet Oral Daily   Chlorhexidine Gluconate Cloth  6 each Topical Daily   famotidine  20 mg Oral BID   feeding supplement  237 mL Oral BID BM   HYDROmorphone   Intravenous Q4H   oxyCODONE  10 mg Oral Q12H   sodium chloride flush  10-40 mL Intracatheter Q12H   traZODone  50 mg Oral QHS   PRN: sodium chloride, acetaminophen **OR** acetaminophen, albuterol, ALPRAZolam, diphenhydrAMINE **OR** diphenhydrAMINE, magic mouthwash w/lidocaine, naloxone **AND** sodium chloride flush, ondansetron (ZOFRAN) IV, mouth rinse, sodium chloride flush  Assessment: 42 yoM with PMH testicular cancer on cytotoxic chemo (last received 11/18), HIV, recent PE/DVT diagnosed 11/1 which had resolved on CT scan from 11/29, now returns with new bilateral submassive PE with RH strain noted on CT (Eliquis had been on hold the past few weeks d/t chemo-induced thrombocytopenia, with Plt nadir 22k). Initially started on Eliquis 10 mg BID, but today PCCM MD concerned  with clinical deterioration/possible need for thrombolysis, and Pharmacy requested to transition patient to IV heparin infusion.  Prior anticoagulation: Eliquis 10 mg PO bid; LD 12/7 @ 0900  Today, 07/30/2023: aPTT remains subtherapeutic at 50 seconds on IV heparin 2600 units/hr Heparin level 0.97 this AM, falsely elevated d/t recent DOAC use.  Trending down as expected.  CBC: Hgb 9.4 low/trending down; Plt improved since chemo, now WNL No further episodes of hemoptysis per RN. RN does report very minor bleeding from IV site earlier today. Continue to monitor.    Goal of Therapy: Heparin level 0.3-0.7 units/ml aPTT 66-102 sec Monitor platelets by anticoagulation protocol: Yes  Plan: Give IV heparin 3000 units x1 Increase IV Heparin to 2900 units/hr  F/u aPTT in 6hrs Daily CBC and heparin level; will need to dose heparin from aPTT until DOAC effects have dissipated Monitor for signs of bleeding or thrombosis   Cherylin Mylar, PharmD Clinical Pharmacist  12/9/20245:57 PM

## 2023-07-30 NOTE — Assessment & Plan Note (Signed)
-   Chemo effect from bleomycin -Cell counts are recovering

## 2023-07-30 NOTE — Progress Notes (Signed)
[facility] CSW Progress Note  Clinical Child psychotherapist submitted referral documents on this date to the [facility] as requested by patient.   Marguerita Merles, LCSW Clinical Social Worker [facility]

## 2023-07-30 NOTE — Progress Notes (Signed)
Steve Stewart   DOB:Oct 01, 1990   QI#:[id number]    ASSESSMENT & PLAN:  32 y.o. male with diagnosis of left seminoma s/p orchiectomy presented with acute PE.    Diagnosis: pT2 [id number] normal TM. 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  MRI showed benign adrenal nodule.   Treatment: Status post cycle 1 BEP  Acute PE On heparin drip.  Still on oxygen. Repeat CTA showed likely right lower lobe pulmonary infarct. Monitor for improvement before switching to oral DOAC   Acute respiratory insufficiency Due to PE as above Postpone chemotherapy this week   Pancytopenia Resolved, secondary to recent chemotherapy as expected Mild anemia and microcytosis Will check ferritin   Stage IIA seminoma Completed cycle 1 of BEP will postpone cycle 2 for 4 weeks   Code Status Full code  Spoke to patient about the above plan.  He is recovering from ICU.  Thank you for the consult. Will follow with you. [name], MD 07/30/2023 8:35 AM  Subjective:  Steve Stewart reports feeling better here in the ICU today.  Report of pain over the right side of the chest wall more than the left.  Pain more so on deep breathing.  PCA is helping.  He started eating yesterday.  He denies any abdominal pain, nausea, vomiting or diarrhea.  No other symptoms.  Objective:  Vitals:   07/30/23 0500 07/30/23 0600  BP: 123/81 (!) 103/56  Pulse: 97 92  Resp: (!) 29 20  Temp:    SpO2: 98% 98%     Intake/Output Summary (Last 24 hours) at 07/30/2023 0835 Last data filed at 07/30/2023 0600 Gross per 24 hour  Intake 668.29 ml  Output 1150 ml  Net -481.71 ml    GENERAL: alert, no distress and on oxygen SKIN: skin color normal EYES: normal, sclera clear OROPHARYNX:  no exudate, no erythema.  LUNGS: No wheeze, rales and clear to auscultation bilaterally with oxygen in place HEART: regular rate & rhythm  ABDOMEN: abdomen soft, non-tender and non-distended Musculoskeletal: no lower extremity  edema NEURO: alert with fluent speech, no focal motor/sensory deficits   Labs:  Recent Labs    07/24/23 0759 07/26/23 2253 07/27/23 0532 07/29/23 0616 07/30/23 0402  NA 138   < > 135 128* 124*  K 3.6   < > 4.3 4.4 3.6  CL 105   < > 104 92* 89*  CO2 27   < > 22 26 26   GLUCOSE 111*   < > 107* 107* 114*  BUN 13   < > 15 14 14   CREATININE 0.89   < > 0.94 0.99 0.67  CALCIUM 8.5*   < > 8.8* 8.6* 7.8*  GFRNONAA >60   < > >60 >60 >60  PROT 6.1*  --   --  7.3 6.8  ALBUMIN 3.6  --   --  3.0* 2.6*  AST 13*  --   --  13* 16  ALT 28  --   --  23 21  ALKPHOS 53  --   --  70 63  BILITOT 0.3  --   --  0.4 0.4   < > = values in this interval not displayed.    Studies:  CT Angio Chest Pulmonary Embolism (PE) W or WO Contrast  Addendum Date: 07/29/2023   ADDENDUM REPORT: 07/29/2023 06:52 ADDENDUM: Bolus timing on the current study is markedly suboptimal for the pulmonary arteries. Within this limitation, the right lower lobe pulmonary embolus is not  substantially changed since the 07/27/2023 exam. Pulmonary embolus seen previously in the left upper lobe is not evident on the current exam, likely due to bolus timing. Electronically Signed   By: Kennith Center M.D.   On: 07/29/2023 06:52   Result Date: 07/29/2023 CLINICAL DATA:  Pulmonary embolus on CT yesterday. EXAM: CT ANGIOGRAPHY CHEST WITH CONTRAST TECHNIQUE: Multidetector CT imaging of the chest was performed using the standard protocol during bolus administration of intravenous contrast. Multiplanar CT image reconstructions and MIPs were obtained to evaluate the vascular anatomy. RADIATION DOSE REDUCTION: This exam was performed according to the departmental dose-optimization program which includes automated exposure control, adjustment of the mA and/or kV according to patient size and/or use of iterative reconstruction technique. CONTRAST:  75mL OMNIPAQUE IOHEXOL 350 MG/ML SOLN COMPARISON:  07/27/2023 FINDINGS: Cardiovascular: The heart size is  normal. No substantial pericardial effusion. No thoracic aortic aneurysm. Right Port-A-Cath tip is positioned in the right atrium. Mediastinum/Nodes: 13 mm subcarinal lymph node is more prominent than on yesterday's study. No left hilar lymphadenopathy. No definite right hilar lymphadenopathy. The esophagus has normal imaging features. There is no axillary lymphadenopathy. Lungs/Pleura: Since yesterday's study, the patient has developed consolidative and ground-glass airspace disease in the right lower lobe with right lower lobe interlobular septal thickening. Given the right lower lobe pulmonary embolus on yesterday's exam, findings likely reflect right lower lobe pulmonary infarct. Subsegmental atelectasis is seen in the right upper lobe and left base although fine detail is obscured by breathing motion. No substantial pleural effusion. Upper Abdomen: Visualized portion of the upper abdomen shows no acute findings. Musculoskeletal: No worrisome lytic or sclerotic osseous abnormality. Review of the MIP images confirms the above findings. IMPRESSION: 1. Since yesterday's study, the patient has developed progressive consolidative and ground-glass airspace disease in the right lower lobe with right lower lobe interlobular septal thickening. Given the right lower lobe pulmonary embolus , findings likely reflect right lower lobe pulmonary infarct. 2. 13 mm subcarinal lymph node is more prominent than on yesterday's study. This is likely reactive. 3. Areas of subsegmental atelectasis in the lung bases bilaterally. Electronically Signed: By: Kennith Center M.D. On: 07/28/2023 13:43   DG CHEST PORT 1 VIEW  Result Date: 07/28/2023 CLINICAL DATA:  Respiratory distress. EXAM: PORTABLE CHEST 1 VIEW COMPARISON:  07/26/2023.  CT, 07/28/2023 at 12:52 p.m. FINDINGS: Cardiac silhouette normal in size. Stable right anterior chest wall Port-A-Cath. Low lung volumes. Hazy airspace opacity at the right lung base mostly obscures the  right hemidiaphragm. Mild opacity noted at the medial left lung base. No pneumothorax. IMPRESSION: 1. Low lung volumes. 2. Right greater than left lung base opacity without convincing change from the CT performed earlier today. Electronically Signed   By: Amie Portland M.D.   On: 07/28/2023 17:25   ECHOCARDIOGRAM LIMITED  Result Date: 07/28/2023    ECHOCARDIOGRAM LIMITED REPORT   Patient Name:   DAQUAIN CONTORNO Date of Exam: 07/28/2023 Medical Rec #:  [id number]            Height:       71.0 in Accession #:    [id number]           Weight:       256.0 lb Date of Birth:  [birth year]            BSA:          2.342 m Patient Age:    32 years  BP:           132/76 mmHg Patient Gender: M                    HR:           110 bpm. Exam Location:  Inpatient Procedure: Limited Echo Indications:    RV Strain  History:        Patient has prior history of Echocardiogram examinations, most                 recent 07/27/2023.  Sonographer:    Darlys Gales Referring Phys: [id number] MURALI RAMASWAMY IMPRESSIONS  1. Limited echo with poor quality images.  2. Left ventricular ejection fraction, by estimation, is 55 to 60%. The left ventricle has normal function.  3. Right ventricular systolic function is normal. The right ventricular size is normal.  4. The aortic valve is tricuspid. FINDINGS  Left Ventricle: Left ventricular ejection fraction, by estimation, is 55 to 60%. The left ventricle has normal function. The left ventricular internal cavity size was normal in size. Right Ventricle: The right ventricular size is normal. Right vetricular wall thickness was not assessed. Right ventricular systolic function is normal. Pericardium: There is no evidence of pericardial effusion. Tricuspid Valve: The tricuspid valve is not assessed. Aortic Valve: The aortic valve is tricuspid. IAS/Shunts: The interatrial septum was not well visualized. Additional Comments: Limited echo with poor quality images.  RIGHT VENTRICLE RV Basal  diam:  3.20 cm RV Mid diam:    3.30 cm RV S prime:     19.70 cm/s TAPSE (M-mode): 2.3 cm Charlton Haws MD Electronically signed by Charlton Haws MD Signature Date/Time: 07/28/2023/3:10:07 PM    Final    VAS [place] LOWER EXTREMITY VENOUS (DVT)  Result Date: 07/27/2023  Lower Venous DVT Study Patient Name:  TAHMIR ZUMBA  Date of Exam:   07/27/2023 Medical Rec #: [id number]             Accession #:    [id number] Date of Birth: 1991/04/01             Patient Gender: M Patient Age:   45 years Exam Location:  [facility] Procedure:      VAS [place] LOWER EXTREMITY VENOUS (DVT) Referring Phys: Lyda Perone --------------------------------------------------------------------------------  Indications: Pulmonary embolism.  Risk Factors: Confirmed PE DVT. Anticoagulation: Eliquis. Comparison Study: 06/21/2023 - RIGHT:                   - No evidence of common femoral vein obstruction.                    LEFT:                   - Findings consistent with acute deep vein thrombosis                   involving the left                   popliteal vein, left posterior tibial veins, and left peroneal                   veins.                   - No cystic structure found in the popliteal fossa. Performing Technologist: Chanda Busing RVT  Examination Guidelines: A complete evaluation includes B-mode imaging, spectral Doppler, color Doppler, and power Doppler as  needed of all accessible portions of each vessel. Bilateral testing is considered an integral part of a complete examination. Limited examinations for reoccurring indications may be performed as noted. The reflux portion of the exam is performed with the patient in reverse Trendelenburg.  +---------+---------------+---------+-----------+----------+--------------+ RIGHT    CompressibilityPhasicitySpontaneityPropertiesThrombus Aging +---------+---------------+---------+-----------+----------+--------------+ CFV      Full           Yes      Yes                                  +---------+---------------+---------+-----------+----------+--------------+ SFJ      Full                                                        +---------+---------------+---------+-----------+----------+--------------+ FV Prox  Full                                                        +---------+---------------+---------+-----------+----------+--------------+ FV Mid   Full                                                        +---------+---------------+---------+-----------+----------+--------------+ FV DistalFull                                                        +---------+---------------+---------+-----------+----------+--------------+ PFV      Full                                                        +---------+---------------+---------+-----------+----------+--------------+ POP      Full           Yes      Yes                                 +---------+---------------+---------+-----------+----------+--------------+ PTV      Full                                                        +---------+---------------+---------+-----------+----------+--------------+ PERO     Full                                                        +---------+---------------+---------+-----------+----------+--------------+   +---------+---------------+---------+-----------+----------+--------------+  LEFT     CompressibilityPhasicitySpontaneityPropertiesThrombus Aging +---------+---------------+---------+-----------+----------+--------------+ CFV      Full           Yes      Yes                                 +---------+---------------+---------+-----------+----------+--------------+ SFJ      Full                                                        +---------+---------------+---------+-----------+----------+--------------+ FV Prox  Full                                                         +---------+---------------+---------+-----------+----------+--------------+ FV Mid   Partial        Yes      Yes                  Acute          +---------+---------------+---------+-----------+----------+--------------+ FV DistalNone           No       No                   Acute          +---------+---------------+---------+-----------+----------+--------------+ PFV      Full                                                        +---------+---------------+---------+-----------+----------+--------------+ POP      None           No       No                   Acute          +---------+---------------+---------+-----------+----------+--------------+ PTV      None                                         Acute          +---------+---------------+---------+-----------+----------+--------------+ PERO     None                                         Acute          +---------+---------------+---------+-----------+----------+--------------+ Soleal   None                                         Acute          +---------+---------------+---------+-----------+----------+--------------+ Gastroc  None  Acute          +---------+---------------+---------+-----------+----------+--------------+ SSV      Full                                                        +---------+---------------+---------+-----------+----------+--------------+     Summary: RIGHT: - There is no evidence of deep vein thrombosis in the lower extremity.  - No cystic structure found in the popliteal fossa.  LEFT: - Findings consistent with acute deep vein thrombosis involving the left femoral vein, left popliteal vein, left posterior tibial veins, and left peroneal veins. Findings consistent with acute intramuscular thrombosis involving the left gastrocnemius veins, and left soleal veins. - No cystic structure found in the popliteal fossa.  *See table(s) above  for measurements and observations. Electronically signed by Carolynn Sayers on 07/27/2023 at 2:16:43 PM.    Final    ECHOCARDIOGRAM LIMITED  Result Date: 07/27/2023    ECHOCARDIOGRAM LIMITED REPORT   Patient Name:   MARUIN EMBREE Date of Exam: 07/27/2023 Medical Rec #:  [id number]            Height:       71.0 in Accession #:    [id number]           Weight:       256.0 lb Date of Birth:  26-Jul-1991            BSA:          2.342 m Patient Age:    32 years             BP:           119/78 mmHg Patient Gender: M                    HR:           87 bpm. Exam Location:  Inpatient Procedure: Limited Echo, Cardiac Doppler and Color Doppler Indications:    I26.02 Pulmonary embolus  History:        Patient has prior history of Echocardiogram examinations, most                 recent 06/23/2023. Signs/Symptoms:Shortness of Breath and                 Dyspnea. Pulmonary embolus. Cancer. Cor Pulmonale.  Sonographer:    Sheralyn Boatman RDCS Referring Phys: [phone] JARED M GARDNER  Sonographer Comments: Technically difficult study due to poor echo windows. Patient in pain, could not turn. IMPRESSIONS  1. Left ventricular ejection fraction, by estimation, is 60 to 65%. The left ventricle has normal function. The left ventricle has no regional wall motion abnormalities.  2. Right ventricular systolic function is normal. The right ventricular size is moderately enlarged. Tricuspid regurgitation signal is inadequate for assessing PA pressure.  3. No evidence of mitral valve regurgitation.  4. The inferior vena cava is normal in size with greater than 50% respiratory variability, suggesting right atrial pressure of 3 mmHg. Conclusion(s)/Recommendation(s): RV does look more dilated compared to prior study; c/f RV strain. FINDINGS  Left Ventricle: Left ventricular ejection fraction, by estimation, is 60 to 65%. The left ventricle has normal function. The left ventricle has no regional wall motion abnormalities. The left ventricular internal  cavity size was normal in size. Right Ventricle: The right  ventricular size is moderately enlarged. Right ventricular systolic function is normal. Tricuspid regurgitation signal is inadequate for assessing PA pressure. Right Atrium: Right atrial size was normal in size. Tricuspid Valve: Tricuspid valve regurgitation is not demonstrated. Venous: The inferior vena cava is normal in size with greater than 50% respiratory variability, suggesting right atrial pressure of 3 mmHg. Additional Comments: Spectral Doppler performed. Color Doppler performed.  LEFT VENTRICLE PLAX 2D LVIDd:         4.30 cm LVIDs:         2.90 cm LV PW:         1.00 cm LV IVS:        1.10 cm LVOT diam:     2.40 cm LV SV:         66 LV SV Index:   28 LVOT Area:     4.52 cm  LV Volumes (MOD) LV vol d, MOD A2C: 68.2 ml LV vol d, MOD A4C: 69.0 ml LV vol s, MOD A2C: 21.6 ml LV vol s, MOD A4C: 21.8 ml LV SV MOD A2C:     46.6 ml LV SV MOD A4C:     69.0 ml LV SV MOD BP:      47.7 ml RIGHT VENTRICLE             IVC RV S prime:     10.90 cm/s  IVC diam: 1.90 cm TAPSE (M-mode): 2.0 cm RIGHT ATRIUM           Index RA Area:     14.90 cm RA Volume:   40.20 ml  17.17 ml/m  AORTIC VALVE LVOT Vmax:   102.00 cm/s LVOT Vmean:  64.600 cm/s LVOT VTI:    0.145 m  AORTA Ao Asc diam: 3.30 cm MITRAL VALVE MV Area (PHT): 4.60 cm    SHUNTS MV Decel Time: 165 msec    Systemic VTI:  0.14 m MV E velocity: 82.40 cm/s  Systemic Diam: 2.40 cm MV A velocity: 78.40 cm/s MV E/A ratio:  1.05 [place] signed by Carolan Clines Signature Date/Time: 07/27/2023/12:17:48 PM    Final    CT Angio Chest PE W and/or Wo Contrast  Result Date: 07/27/2023 CLINICAL DATA:  High probability for PE. EXAM: CT ANGIOGRAPHY CHEST WITH CONTRAST TECHNIQUE: Multidetector CT imaging of the chest was performed using the standard protocol during bolus administration of intravenous contrast. Multiplanar CT image reconstructions and MIPs were obtained to evaluate the vascular anatomy.  RADIATION DOSE REDUCTION: This exam was performed according to the departmental dose-optimization program which includes automated exposure control, adjustment of the mA and/or kV according to patient size and/or use of iterative reconstruction technique. CONTRAST:  80mL OMNIPAQUE IOHEXOL 350 MG/ML SOLN COMPARISON:  CT angiogram chest 07/19/2028. FINDINGS: Cardiovascular: There are pulmonary emboli within lobar, segmental and subsegmental branches of the right lower lobe there also pulmonary emboli within subsegmental branches of the left upper lobe. Aorta is normal in size. Heart is normal in size. There is no pericardial effusion. Right chest port catheter tip ends in the distal SVC. Mediastinum/Nodes: No enlarged mediastinal, hilar, or axillary lymph nodes. Thyroid gland, trachea, and esophagus demonstrate no significant findings. Lungs/Pleura: Peripheral patchy ground-glass opacities in the right lower lobe are favored as pulmonary infarcts. There is minimal atelectasis in the right middle lobe. No pleural effusion or pneumothorax. Upper Abdomen: No acute abnormality. Musculoskeletal: No chest wall abnormality. No acute or significant osseous findings. Review of the MIP images confirms the above findings. IMPRESSION: 1.  Bilateral pulmonary emboli, right greater than left. Positive for acute PE with CTevidence of right heart strain (RV/LV Ratio = 1.3) consistent with at least submassive (intermediate risk) PE. The presence of right heart strain has been associated with an increased risk of morbidity and mortality. 2. Peripheral patchy ground-glass opacities in the right lower lobe are favored as pulmonary infarcts. These results were called by telephone at the time of interpretation on 07/27/2023 at 12:39 am to provider [facility] , who verbally acknowledged these results. Electronically Signed   By: Darliss Cheney M.D.   On: 07/27/2023 00:41   DG Chest 2 View  Result Date: 07/26/2023 CLINICAL DATA:   Shortness of breath. History of DVT and pulmonary embolus. Off of Eliquis for couple of weeks. EXAM: CHEST - 2 VIEW COMPARISON:  07/20/2023 FINDINGS: Power port type central venous catheter with tip over the cavoatrial junction region. No pneumothorax. Shallow inspiration. Linear atelectasis or infiltration seen in the right lung base. No pleural effusions. No pneumothorax. Mediastinal contours appear intact. Heart size and pulmonary vascularity are normal. IMPRESSION: Shallow inspiration with linear atelectasis or infiltration in the right lung base. Electronically Signed   By: Burman Nieves M.D.   On: 07/26/2023 23:24   CT Angio Chest PE W and/or Wo Contrast  Result Date: 07/20/2023 CLINICAL DATA:  Short of breath and chest pain beginning late last night. Patient stopped taking Eliquis on Tuesday. EXAM: CT ANGIOGRAPHY CHEST WITH CONTRAST TECHNIQUE: Multidetector CT imaging of the chest was performed using the standard protocol during bolus administration of intravenous contrast. Multiplanar CT image reconstructions and MIPs were obtained to evaluate the vascular anatomy. RADIATION DOSE REDUCTION: This exam was performed according to the departmental dose-optimization program which includes automated exposure control, adjustment of the mA and/or kV according to patient size and/or use of iterative reconstruction technique. CONTRAST:  75mL OMNIPAQUE IOHEXOL 350 MG/ML SOLN COMPARISON:  06/22/2023. FINDINGS: Cardiovascular: Pulmonary arteries are well opacified. No evidence of a pulmonary embolism. Previously noted right-sided pulmonary emboli have resolved. Heart is normal in size and configuration. No pericardial effusion. Great vessels are normal in caliber. No aortic dissection or atherosclerosis. Arch branch vessels are widely patent. Mediastinum/Nodes: No neck base, mediastinal or hilar masses. No enlarged lymph nodes. Trachea and esophagus are unremarkable. Lungs/Pleura: Mild ground-glass opacities in  the lower lobes suspected to be atelectasis. More confluent type opacity noted in the lower lobes on the prior study resolved. Remainder of the lungs is clear. No pleural effusion or pneumothorax. Upper Abdomen: No acute findings. 1.9 cm gallstone, dependent aspect of a distended gallbladder. No gallbladder wall thickening or adjacent inflammation. Musculoskeletal: No chest wall abnormality. No acute or significant osseous findings. Review of the MIP images confirms the above findings. IMPRESSION: 1. No evidence of a pulmonary embolism. Previously noted right-sided pulmonary emboli have resolved. 2. No acute findings. 3. Cholelithiasis. 4. Mild ground-glass opacities in the lower lobes suspected to be atelectasis. Electronically Signed   By: Amie Portland M.D.   On: 07/20/2023 09:15   DG Chest 2 View  Result Date: 07/20/2023 CLINICAL DATA:  Chest pain EXAM: CHEST - 2 VIEW COMPARISON:  06/22/2023 FINDINGS: Porta catheter on the right with tip at the upper cavoatrial junction. Normal heart size and mediastinal contours. No acute infiltrate or edema. No effusion or pneumothorax. No acute osseous findings. IMPRESSION: No active cardiopulmonary disease. Electronically Signed   By: Tiburcio Pea M.D.   On: 07/20/2023 06:59

## 2023-07-30 NOTE — Progress Notes (Signed)
PHARMACY - ANTICOAGULATION CONSULT NOTE  Pharmacy Consult for IV heparin Indication: recurrent PE  No Known Allergies  Patient Measurements: Height: 5\' 11"  (180.3 cm) Weight: 104.4 kg (230 lb 2.6 oz) IBW/kg (Calculated) : 75.3 Heparin Dosing Weight: 100 kg  Vital Signs: Temp: 98.7 F (37.1 C) (12/09 0341) Temp Source: Axillary (12/09 0341) BP: 102/57 (12/09 0400) Pulse Rate: 91 (12/09 0405)  Labs: Recent Labs    07/27/23 0532 07/28/23 1438 07/28/23 2356 07/29/23 0616 07/29/23 1626 07/30/23 0402  HGB 11.4*  --   --  10.9*  --  9.4*  HCT 33.6*  --   --  32.1*  --  28.0*  PLT 140*  --   --  264  --  240  APTT  --   --    < > 58* 54* 74*  HEPARINUNFRC  --   --   --  >1.10*  --  0.97*  CREATININE 0.94  --   --  0.99  --  0.67  TROPONINIHS  --  <2  --  3  --   --    < > = values in this interval not displayed.    Estimated Creatinine Clearance: 159.9 mL/min (by C-G formula based on SCr of 0.67 mg/dL).   Medical History: Past Medical History:  Diagnosis Date   HIV infection (HCC)    Medical history non-contributory    Testicular cancer (HCC)     Medications:  Medications Prior to Admission  Medication Sig Dispense Refill Last Dose   acetaminophen (TYLENOL) 650 MG CR tablet Take 1,300 mg by mouth every 8 (eight) hours as needed for pain.   07/26/2023   albuterol (VENTOLIN HFA) 108 (90 Base) MCG/ACT inhaler Inhale 1 puff into the lungs every 6 (six) hours as needed for wheezing or shortness of breath.   Past Month   bictegravir-emtricitabine-tenofovir AF (BIKTARVY) 50-200-25 MG TABS tablet Take 1 tablet by mouth daily. 30 tablet 2 07/26/2023   dexamethasone (DECADRON) 4 MG tablet Take 2 tablets (8 mg total) by mouth daily. Start the day after last cisplatin dose on day 6 of chemo x 3 days.Take with food. 30 tablet 1 unknown   famotidine (PEPCID) 20 MG tablet Take 1 tablet (20 mg total) by mouth 2 (two) times daily. 30 tablet 2 07/26/2023   lidocaine (XYLOCAINE) 2 %  solution Use as directed 15 mLs in the mouth or throat every 3 (three) hours as needed for mouth pain. Mix 1 to 1 ratio with Maalox to rinse for pain 100 mL 0 unknown   lidocaine-prilocaine (EMLA) cream Apply to affected area once 30 g 3 Past Week   prochlorperazine (COMPAZINE) 10 MG tablet Take 1 tablet (10 mg total) by mouth every 6 (six) hours as needed for nausea or vomiting. 30 tablet 1 07/26/2023   traZODone (DESYREL) 50 MG tablet Take 1 tablet (50 mg total) by mouth at bedtime. 90 tablet 1 07/26/2023   apixaban (ELIQUIS) 5 MG TABS tablet Take 5 mg by mouth 2 (two) times daily. ON HOLD as of 07/24/23   07/17/2023   ondansetron (ZOFRAN) 8 MG tablet Take 1 tablet (8 mg total) by mouth every 8 (eight) hours as needed for nausea or vomiting. Start on the third day after last cisplatin dose on day 8 of chemo (Patient not taking: Reported on 07/27/2023) 30 tablet 1 Not Taking   Scheduled:   bictegravir-emtricitabine-tenofovir AF  1 tablet Oral Daily   Chlorhexidine Gluconate Cloth  6 each Topical Daily  famotidine  20 mg Oral BID   feeding supplement  237 mL Oral BID BM   HYDROmorphone   Intravenous Q4H   oxyCODONE  10 mg Oral Q12H   traZODone  50 mg Oral QHS   PRN: sodium chloride, acetaminophen **OR** acetaminophen, albuterol, ALPRAZolam, diphenhydrAMINE **OR** diphenhydrAMINE, magic mouthwash w/lidocaine, naloxone **AND** sodium chloride flush, ondansetron (ZOFRAN) IV, mouth rinse  Assessment: 77 yoM with PMH testicular cancer on cytotoxic chemo (last received 11/18), HIV, recent PE/DVT diagnosed 11/1 which had resolved on CT scan from 11/29, now returns with new bilateral submassive PE with RH strain noted on CT (Eliquis had been on hold the past few weeks d/t chemo-induced thrombocytopenia, with Plt nadir 22k). Initially started on Eliquis 10 mg BID, but today PCCM MD concerned with clinical deterioration/possible need for thrombolysis, and Pharmacy requested to transition patient to IV heparin  infusion.  Prior anticoagulation: Eliquis 10 mg PO bid; LD 12/7 @ 0900  Significant events:  Today, 07/30/2023: aPTT 74 sec- therapeutic on IV heparin 2300 units/hr Heparin level 0.97- elevated but trending down as expected  CBC: Hgb 9.4 low/trending down; Plt improved since chemo, now WNL No bleeding or infusion issues per nursing  Goal of Therapy: Heparin level 0.3-0.7 units/ml aPTT 66-102 sec Monitor platelets by anticoagulation protocol: Yes  Plan: Continue IV Heparin at 2300 units/hr  Check confirmatory aPTT at 1000 Daily CBC and heparin level; will need to dose heparin from aPTT until DOAC effects have dissipated Monitor for signs of bleeding or thrombosis  Thank you for allowing pharmacy to be a part of this patient's care.  Junita Push, PharmD, BCPS 07/30/2023 4:52 AM

## 2023-07-30 NOTE — Progress Notes (Signed)
[facility] CSW Progress Note  Clinical Child psychotherapist placed referral for [employer] as requested by patient.   Marguerita Merles, LCSW Clinical Social Worker [facility]

## 2023-07-30 NOTE — Progress Notes (Signed)
Progress Note    Steve Stewart   BJY:[id number]  DOB: 02/25/1991  DOA: 07/26/2023     3 PCP: Patient, No Pcp Per  Initial CC: left leg pain, SOB  Hospital Course: Wilner Laurich is a 32 y.o. 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  Pt currently on bleomycin for seminoma (cycle 1 completed 11/18 - 12/3). Pt had been on eliquis for DVT/PE but this was put on hold this past week due to severe thrombocytopenia with platelets in the 20s.   Repeat CT angio chest performed 07/20/2023 and showed resolution of previously seen right sided pulmonary emboli. He again underwent CT angio chest on 07/27/2023 on admission which showed bilateral pulmonary emboli right greater than left and peripheral groundglass opacities in the right lower lobe favored as pulmonary infarcts. Lower extremity duplex also revealed left lower extremity acute DVT involving multiple veins. Echo was performed on 07/27/2023 which showed normal RV function with moderately enlarged right ventricle.  Echo was again repeated on 07/28/2023 which showed normal RV function and normal RV size at that time. He has been followed by pulmonology as well. He has been on a heparin drip with plans for transitioning to Eliquis on 07/31/2023.  Interval History:  Patient notes pain in his left leg has improved some since admission.  Still having some ongoing pleuritic pain when breathing.  Sleeps well with BiPAP.  Oxygen in place.  Assessment and Plan: * Acute pulmonary embolism with acute cor pulmonale (HCC) - CT angio chest performed on 07/27/2023 showing bilateral PE right greater than left along with patchy peripheral groundglass opacities in the right lower lobe concerning for pulmonary infarcts - Initial echo on 07/27/2023 showed moderate right ventricular enlargement, normal RV function.  Repeat echo on 07/28/2023 showed normalized right heart - Remains on heparin drip with  tentative plans for transitioning to Eliquis on 07/31/2023 - Pulmonology following - continue IS, BIPAP, and O2  Acute respiratory failure with hypoxia (HCC) - due to underlying PE, with presumed pulmonary infarct and GGO - continue O2, wean as able - not on home O2 at baseline  - IS, BIPAP  Primary seminoma of left testis (HCC) Pt on chemo with heme/onc including bleomycin - cycle 1 completed 11/18 - 12/3  HIV disease (HCC) - Recent diagnosis beginning of November 2024 - Continue Biktarvy -CD4 255 on 06/28/2023; HIV VL 117k  DVT (deep venous thrombosis) (HCC) - Duplex performed 07/27/2023: "Findings consistent with acute deep vein thrombosis involving the left femoral vein, left popliteal vein, left posterior tibial veins, and left peroneal veins. Findings consistent with acute intramuscular thrombosis involving the left gastrocnemius veins, and left soleal veins" - see PE workup  Hyponatremia - Drop noted today.  Asymptomatic - Trending for now  Pancytopenia (HCC) - Chemo effect from bleomycin -Cell counts are recovering   Old records reviewed in assessment of this patient  Antimicrobials:   DVT prophylaxis:  Heparin drip   Code Status:   Code Status: Full Code  Mobility Assessment (Last 72 Hours)     Mobility Assessment     Row Name 07/29/23 2000 07/29/23 0800 07/28/23 2000 07/28/23 1620 07/28/23 0908   Does patient have an order for bedrest or is patient medically unstable Yes- Bedfast (Level 1) - Complete No - Continue assessment No - Continue assessment No - Continue assessment No - Continue assessment   What is the highest level of mobility based on the  progressive mobility assessment? Level 3 (Stands with assist) - Balance while standing  and cannot march in place Level 3 (Stands with assist) - Balance while standing  and cannot march in place Level 3 (Stands with assist) - Balance while standing  and cannot march in place Level 3 (Stands with assist) - Balance  while standing  and cannot march in place Level 5 (Walks with assist in room/hall) - Balance while stepping forward/back and can walk in room with assist - Complete   Is the above level different from baseline mobility prior to current illness? Yes - Recommend PT order Yes - Recommend PT order Yes - Recommend PT order -- --    Row Name 07/27/23 1950           Does patient have an order for bedrest or is patient medically unstable No - Continue assessment       What is the highest level of mobility based on the progressive mobility assessment? Level 5 (Walks with assist in room/hall) - Balance while stepping forward/back and can walk in room with assist - Complete                Barriers to discharge: none Disposition Plan:  Home Status is: Inpt  Objective: Blood pressure 111/61, pulse (!) 109, temperature 97.6 F (36.4 C), temperature source Axillary, resp. rate (!) 32, height 5\' 11"  (1.803 m), weight 104.4 kg, SpO2 93%.  Examination:  Physical Exam Constitutional:      General: He is not in acute distress.    Appearance: Normal appearance.  HENT:     Head: Normocephalic and atraumatic.     Comments: Mild hair loss noted    Mouth/Throat:     Mouth: Mucous membranes are moist.  Eyes:     Extraocular Movements: Extraocular movements intact.  Cardiovascular:     Rate and Rhythm: Normal rate and regular rhythm.  Pulmonary:     Effort: Pulmonary effort is normal. No respiratory distress.     Breath sounds: No wheezing.     Comments: Coarse breath sounds bilaterally  Abdominal:     General: Bowel sounds are normal. There is no distension.     Palpations: Abdomen is soft.     Tenderness: There is no abdominal tenderness.  Musculoskeletal:        General: Normal range of motion.     Cervical back: Normal range of motion and neck supple.  Skin:    General: Skin is warm and dry.  Neurological:     General: No focal deficit present.     Mental Status: He is alert.   Psychiatric:        Mood and Affect: Mood normal.        Behavior: Behavior normal.      Consultants:  Pulmonology  Oncology   Procedures:    Data Reviewed: Results for orders placed or performed during the hospital encounter of 07/26/23 (from the past 24 hour(s))  APTT     Status: Abnormal   Collection Time: 07/29/23  4:26 PM  Result Value Ref Range   aPTT 54 (H) 24 - 36 seconds  CBC     Status: Abnormal   Collection Time: 07/30/23  4:02 AM  Result Value Ref Range   WBC 7.0 4.0 - 10.5 K/uL   RBC 3.28 (L) 4.22 - 5.81 MIL/uL   Hemoglobin 9.4 (L) 13.0 - 17.0 g/dL   HCT 16.1 (L) 09.6 - 04.5 %   MCV 85.4 80.0 -  100.0 fL   MCH 28.7 26.0 - 34.0 pg   MCHC 33.6 30.0 - 36.0 g/dL   RDW 02.7 25.3 - 66.4 %   Platelets 240 150 - 400 K/uL   nRBC 0.0 0.0 - 0.2 %  Heparin level (unfractionated)     Status: Abnormal   Collection Time: 07/30/23  4:02 AM  Result Value Ref Range   Heparin Unfractionated 0.97 (H) 0.30 - 0.70 IU/mL  Comprehensive metabolic panel     Status: Abnormal   Collection Time: 07/30/23  4:02 AM  Result Value Ref Range   Sodium 124 (L) 135 - 145 mmol/L   Potassium 3.6 3.5 - 5.1 mmol/L   Chloride 89 (L) 98 - 111 mmol/L   CO2 26 22 - 32 mmol/L   Glucose, Bld 114 (H) 70 - 99 mg/dL   BUN 14 6 - 20 mg/dL   Creatinine, Ser 4.03 0.61 - 1.24 mg/dL   Calcium 7.8 (L) 8.9 - 10.3 mg/dL   Total Protein 6.8 6.5 - 8.1 g/dL   Albumin 2.6 (L) 3.5 - 5.0 g/dL   AST 16 15 - 41 U/L   ALT 21 0 - 44 U/L   Alkaline Phosphatase 63 38 - 126 U/L   Total Bilirubin 0.4 <1.2 mg/dL   GFR, Estimated >47 >42 mL/min   Anion gap 9 5 - 15  Magnesium     Status: None   Collection Time: 07/30/23  4:02 AM  Result Value Ref Range   Magnesium 2.3 1.7 - 2.4 mg/dL  Phosphorus     Status: None   Collection Time: 07/30/23  4:02 AM  Result Value Ref Range   Phosphorus 3.6 2.5 - 4.6 mg/dL  Lactic acid, plasma     Status: None   Collection Time: 07/30/23  4:02 AM  Result Value Ref Range    Lactic Acid, Venous 0.5 0.5 - 1.9 mmol/L  Brain natriuretic peptide     Status: None   Collection Time: 07/30/23  4:02 AM  Result Value Ref Range   B Natriuretic Peptide 25.8 0.0 - 100.0 pg/mL  APTT     Status: Abnormal   Collection Time: 07/30/23  4:02 AM  Result Value Ref Range   aPTT 74 (H) 24 - 36 seconds  Troponin I (High Sensitivity)     Status: None   Collection Time: 07/30/23  4:02 AM  Result Value Ref Range   Troponin I (High Sensitivity) 2 <18 ng/L  APTT     Status: Abnormal   Collection Time: 07/30/23 10:00 AM  Result Value Ref Range   aPTT 48 (H) 24 - 36 seconds    I have reviewed pertinent nursing notes, vitals, labs, and images as necessary. I have ordered labwork to follow up on as indicated.  I have reviewed the last notes from staff over past 24 hours. I have discussed patient's care plan and test results with nursing staff, CM/SW, and other staff as appropriate.  Time spent: Greater than 50% of the 55 minute visit was spent in counseling/coordination of care for the patient as laid out in the A&P.   LOS: 3 days   Lewie Chamber, MD [facility] 07/30/2023, 12:37 PM

## 2023-07-30 NOTE — Progress Notes (Signed)
PHARMACY - ANTICOAGULATION CONSULT NOTE  Pharmacy Consult for IV heparin Indication: recurrent PE  No Known Allergies  Patient Measurements: Height: 5\' 11"  (180.3 cm) Weight: 104.4 kg (230 lb 2.6 oz) IBW/kg (Calculated) : 75.3 Heparin Dosing Weight: 100 kg  Vital Signs: Temp: 98.7 F (37.1 C) (12/09 0341) Temp Source: Axillary (12/09 0341) BP: 103/56 (12/09 0600) Pulse Rate: 92 (12/09 0600)  Labs: Recent Labs    07/28/23 1438 07/28/23 2356 07/29/23 0616 07/29/23 1626 07/30/23 0402  HGB  --   --  10.9*  --  9.4*  HCT  --   --  32.1*  --  28.0*  PLT  --   --  264  --  240  APTT  --    < > 58* 54* 74*  HEPARINUNFRC  --   --  >1.10*  --  0.97*  CREATININE  --   --  0.99  --  0.67  TROPONINIHS <2  --  3  --  2   < > = values in this interval not displayed.    Estimated Creatinine Clearance: 159.9 mL/min (by C-G formula based on SCr of 0.67 mg/dL).   Medical History: Past Medical History:  Diagnosis Date   HIV infection (HCC)    Medical history non-contributory    Testicular cancer (HCC)     Medications:  Medications Prior to Admission  Medication Sig Dispense Refill Last Dose   acetaminophen (TYLENOL) 650 MG CR tablet Take 1,300 mg by mouth every 8 (eight) hours as needed for pain.   07/26/2023   albuterol (VENTOLIN HFA) 108 (90 Base) MCG/ACT inhaler Inhale 1 puff into the lungs every 6 (six) hours as needed for wheezing or shortness of breath.   Past Month   bictegravir-emtricitabine-tenofovir AF (BIKTARVY) 50-200-25 MG TABS tablet Take 1 tablet by mouth daily. 30 tablet 2 07/26/2023   dexamethasone (DECADRON) 4 MG tablet Take 2 tablets (8 mg total) by mouth daily. Start the day after last cisplatin dose on day 6 of chemo x 3 days.Take with food. 30 tablet 1 unknown   famotidine (PEPCID) 20 MG tablet Take 1 tablet (20 mg total) by mouth 2 (two) times daily. 30 tablet 2 07/26/2023   lidocaine (XYLOCAINE) 2 % solution Use as directed 15 mLs in the mouth or throat every  3 (three) hours as needed for mouth pain. Mix 1 to 1 ratio with Maalox to rinse for pain 100 mL 0 unknown   lidocaine-prilocaine (EMLA) cream Apply to affected area once 30 g 3 Past Week   prochlorperazine (COMPAZINE) 10 MG tablet Take 1 tablet (10 mg total) by mouth every 6 (six) hours as needed for nausea or vomiting. 30 tablet 1 07/26/2023   traZODone (DESYREL) 50 MG tablet Take 1 tablet (50 mg total) by mouth at bedtime. 90 tablet 1 07/26/2023   apixaban (ELIQUIS) 5 MG TABS tablet Take 5 mg by mouth 2 (two) times daily. ON HOLD as of 07/24/23   07/17/2023   ondansetron (ZOFRAN) 8 MG tablet Take 1 tablet (8 mg total) by mouth every 8 (eight) hours as needed for nausea or vomiting. Start on the third day after last cisplatin dose on day 8 of chemo (Patient not taking: Reported on 07/27/2023) 30 tablet 1 Not Taking   Scheduled:   bictegravir-emtricitabine-tenofovir AF  1 tablet Oral Daily   Chlorhexidine Gluconate Cloth  6 each Topical Daily   famotidine  20 mg Oral BID   feeding supplement  237 mL Oral BID BM  HYDROmorphone   Intravenous Q4H   oxyCODONE  10 mg Oral Q12H   sodium chloride flush  10-40 mL Intracatheter Q12H   traZODone  50 mg Oral QHS   PRN: sodium chloride, acetaminophen **OR** acetaminophen, albuterol, ALPRAZolam, diphenhydrAMINE **OR** diphenhydrAMINE, magic mouthwash w/lidocaine, naloxone **AND** sodium chloride flush, ondansetron (ZOFRAN) IV, mouth rinse, sodium chloride flush  Assessment: 21 yoM with PMH testicular cancer on cytotoxic chemo (last received 11/18), HIV, recent PE/DVT diagnosed 11/1 which had resolved on CT scan from 11/29, now returns with new bilateral submassive PE with RH strain noted on CT (Eliquis had been on hold the past few weeks d/t chemo-induced thrombocytopenia, with Plt nadir 22k). Initially started on Eliquis 10 mg BID, but today PCCM MD concerned with clinical deterioration/possible need for thrombolysis, and Pharmacy requested to transition  patient to IV heparin infusion.  Prior anticoagulation: Eliquis 10 mg PO bid; LD 12/7 @ 0900  Today, 07/30/2023: aPTT decreased to 48 sec- subtherapeutic on IV heparin 2300 units/hr Heparin level 0.97, falsely elevated d/t recent DOAC use.  Trending down as expected.  CBC: Hgb 9.4 low/trending down; Plt improved since chemo, now WNL No active bleeding or infusion issues per nursing.  RN did note one episode of coughing with expectoration of thick sputum with dark red blood (not fresh looking) but no active bleeding.  CCM aware.  Continue to monitor.    Goal of Therapy: Heparin level 0.3-0.7 units/ml aPTT 66-102 sec Monitor platelets by anticoagulation protocol: Yes  Plan: Increase to IV Heparin at 2600 units/hr  No bolus now due to reported blood in sputum, if no further bleeding reported, ok to bolus if needed.   Daily CBC and heparin level; will need to dose heparin from aPTT until DOAC effects have dissipated Monitor for signs of bleeding or thrombosis    Lynann Beaver PharmD, BCPS [facility] main pharmacy [phone] 07/30/2023 10:47 AM

## 2023-07-30 NOTE — Assessment & Plan Note (Signed)
-   Drop noted today.  Asymptomatic - Trending for now

## 2023-07-30 NOTE — Plan of Care (Signed)
  Problem: Health Behavior/Discharge Planning: Goal: Ability to manage health-related needs will improve Outcome: Progressing   Problem: Clinical Measurements: Goal: Will remain free from infection Outcome: Progressing Goal: Diagnostic test results will improve Outcome: Progressing   Problem: Nutrition: Goal: Adequate nutrition will be maintained Outcome: Progressing   Problem: Coping: Goal: Level of anxiety will decrease Outcome: Progressing   Problem: Safety: Goal: Ability to remain free from injury will improve Outcome: Progressing   Problem: Skin Integrity: Goal: Risk for impaired skin integrity will decrease Outcome: Progressing

## 2023-07-30 NOTE — Assessment & Plan Note (Signed)
-   due to underlying PE, with presumed pulmonary infarct and GGO - continue O2, wean as able - not on home O2 at baseline  - IS, BIPAP

## 2023-07-30 NOTE — Consult Note (Signed)
NAME:  Steve Stewart, MRN:  [id number], DOB:  Jun 22, 1991, LOS: 3 ADMISSION DATE:  07/26/2023, CONSULTATION DATE:  07/28/23 REFERRING MD:  Dr Hughie Closs, CHIEF COMPLAINT:  Acute PE worsening     BRIEF   32 year old male with history of HIV on HAART treatment, seminoma s/p orchiectomy.  Suffered his first pulmonary embolism 06/22/2023 with left lower extremity DVT and was on Eliquis.  Seminoma diagnosed 04/28/2023 following 80-month history of testicular mass.  Status post orchiectomy 05/02/2023.  Then started testicular bleomycin every 3 weeks?  First dose 07/09/2023  On 06/21/2023 developed left lower extremity DVT and then on 06/22/2023 presented with acute pulmonary embolism right lower lobe main artery with extension into posterior basilar segmental arteries and distal PE without any evidence of right heart strain.  He was treated with IV heparin and then switched to Eliquis and then discharged on 06/25/23.  During this admission routine screening for HIV turned positive and he was started subsequently on Hart treatment  Followed with oncology 07/05/2023 and plan to start bleomycin 07/09/2023 with plans to hold apixaban 11 days after starting chemo he was advised to quit smoking.  His apixaban was then held on 07/17/2023 following oncology visit.  On this day his platelet count dropped to 69,000.  3 days later his platelet count dropped to 22,000 on 07/20/2023.  A CT angiogram chest on the same day did not show evidence of pulmonary embolism  But he presented to the emergency department on 07/27/2023 with acute pain behind left knee and right shoulder blade and pleuritic pain very similar to prior PE.  CT angiogram chest showed right lower lobe PE.  Radiologist interpreted this for submassive PE with RV LV ratio greater than 1.3 [subsequent interpretation by Dr. Malachy Moan on 07/28/2023 of IR is that this PE did not have any RV strain as a distal PE].  Outpatient Eliquis was resumed.  His  thrombocytopenia nearly resolved at the time of admission to 1 40012/6/24.  However on 07/28/2023 patient started having worsening and new onset acute hypoxemic respiratory failure requiring 2-2 L oxygen.  Interventional radiology recommended repeat CT angiogram chest that shows right lower lobe potential infarct.  Patient is having pleuritic pain.  Last dose of Eliquis 9 AM 07/28/2023  Repeat echocardiogram and pulmonary embolism biomarkers of troponin lactic acid and BNP pending  Past Medical History:   has a past medical history of HIV infection (HCC), Medical history non-contributory, and Testicular cancer (HCC).   has a past surgical history that includes No past surgeries; Orchiectomy (Left, 05/02/2023); and IR IMAGING GUIDED PORT INSERTION (06/18/2023).   Significant Hospital Events:  07/26/2023 - admit with PE,  07/28/23 - PCCM Consult for ongoing acute respiratory failure, right lower lobe infarct/PE, heparin gtt 12/9 5-6L [place] during day, BIPAP at night, on heparin gtt, scant blood tinged/maroon sputum, pain controlled on diluadid gtt   Interim History / Subjective:  Still having ongoing pleuritic pain with coughing but managed with diluadid gtt   Objective   Blood pressure 116/71, pulse 94, temperature 98.4 F (36.9 C), temperature source Axillary, resp. rate (!) 26, height 5\' 11"  (1.803 m), weight 104.4 kg, SpO2 96%.    FiO2 (%):  [40 %] 40 %   Intake/Output Summary (Last 24 hours) at 07/30/2023 0928 Last data filed at 07/30/2023 0600 Gross per 24 hour  Intake 668.29 ml  Output 1150 ml  Net -481.71 ml   Filed Weights   07/26/23 2241 07/28/23 1620  Weight: 116.1 kg  104.4 kg    Examination: General: acutely ill adult male, lying on SD bed  HEENT: Normocephalic, Pink MM, [place] in nares Pulm: clear in upper bases, diminished lower, blood tinged sputum-maroon No respiratory distress  Card: s1,s2, No MRG, RRR Abd: soft active Neuro: AOx4, follows commands Extremities: moves  all extremities with ease, non pitting edema GU: Deferred   Resolved Hospital Problem list   N/a   Assessment & Plan:  Acute Hypoxic Respiratory Failure Acute Pulmonary Embolism     Acute respiratory failure secondary to right lower lobe infarct PE Risk factors - stopping eliquis, obestity, Seminoma and recent PE hx and Smoking Original PE on 06/22/23 with clearance on CT late Nov 2024 but recurence 07/26/23 after Eliquis held 07/17/23 due to Bleomycin related thrombocytopenia 12/6 Vas US-+ left acute DVT involving the left femoral vein/popliteal/posterior tibial veins Originally treated with Eliquis 12/6 - 07/28/23>> Course complicated 07/28/23 with right lower lobe pulmonary infarct acute hypoxic respiratory failure -> Eliquis switched IV heparin Echo on 12/6 concern for RV strain as well as CT on 12/6 IR consulted--> repeat Echo on 12/7 showing no evidence of RV strain as well as CT scan on 12/7 12/9 BNP 25.8 from 42.5, trop 2 from 3, lactic acid 0.5 P:   Continue IV heparin for now  Continue , wean as tolerated Continue IS, flutter valve  Continue to educate in regards to smoking cessation Continue albuterol nebs prn  Continue BIPAP as needed/HS  Right pleuritic pain in setting of Acute PE/right lower lobe infarct P: Continue Diluadid gtt  Continue end-tidal CO2 monitoring   HIV on HAART Incidental HIV diagnosis while in hospital last admit 06/22/23 P:  Continue Biktarvy   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  S/p bleomycin 07/09/23>> thrombocytopenia 07/17/23 - 07/24/23  Pantocytopenia resolving  No signs of bleeding Plt 240, stable  Hgb 9.4  WBC 7.0  P:  Continue Heparin gtt for PE Continue to monitor for signs of bleeding Transfuse for hgb <7   Hyponatremia in setting of PE Na 135>>128>>124 Asymptomatic Patient oral intake adequate  P:  Continue to trend and follow Na daily   Stage II Seminoma  Hx of testicular mass>> seminoma diagnosed 9/7  orchiectomy  05/02/2023 P: Oncology following, appreciate assistance  Continue to follow up outpatient     Best practice (daily eval):  Per [facility]  Disposition: Continue monitoring in SDU   Christian Stephens Shreve AGACNP-BC   [facility] 07/30/2023, 1:12 PM  Please see [email or web address] for pager details.  From 7A-7P if no response, please call [phone]. After hours, please call ELink [phone].

## 2023-07-31 DIAGNOSIS — D649 Anemia, unspecified: Secondary | ICD-10-CM

## 2023-07-31 DIAGNOSIS — B20 Human immunodeficiency virus [HIV] disease: Secondary | ICD-10-CM | POA: Diagnosis not present

## 2023-07-31 DIAGNOSIS — I2609 Other pulmonary embolism with acute cor pulmonale: Secondary | ICD-10-CM | POA: Diagnosis not present

## 2023-07-31 DIAGNOSIS — I2699 Other pulmonary embolism without acute cor pulmonale: Secondary | ICD-10-CM | POA: Diagnosis not present

## 2023-07-31 DIAGNOSIS — C6292 Malignant neoplasm of left testis, unspecified whether descended or undescended: Secondary | ICD-10-CM | POA: Diagnosis not present

## 2023-07-31 DIAGNOSIS — J9601 Acute respiratory failure with hypoxia: Secondary | ICD-10-CM | POA: Diagnosis not present

## 2023-07-31 DIAGNOSIS — D61810 Antineoplastic chemotherapy induced pancytopenia: Secondary | ICD-10-CM | POA: Diagnosis not present

## 2023-07-31 LAB — OSMOLALITY, URINE: Osmolality, Ur: 395 mosm/kg (ref 300–900)

## 2023-07-31 LAB — BASIC METABOLIC PANEL
Anion gap: 9 (ref 5–15)
BUN: 14 mg/dL (ref 6–20)
CO2: 29 mmol/L (ref 22–32)
Calcium: 8.4 mg/dL — ABNORMAL LOW (ref 8.9–10.3)
Chloride: 94 mmol/L — ABNORMAL LOW (ref 98–111)
Creatinine, Ser: 0.82 mg/dL (ref 0.61–1.24)
GFR, Estimated: >60 mL/min (ref >60)
Glucose, Bld: 110 mg/dL — ABNORMAL HIGH (ref 70–99)
Potassium: 3.9 mmol/L (ref 3.5–5.1)
Sodium: 132 mmol/L — ABNORMAL LOW (ref 135–145)

## 2023-07-31 LAB — CBC
HCT: 28 % — ABNORMAL LOW (ref 39.0–52.0)
Hemoglobin: 9.2 g/dL — ABNORMAL LOW (ref 13.0–17.0)
MCH: 28.4 pg (ref 26.0–34.0)
MCHC: 32.9 g/dL (ref 30.0–36.0)
MCV: 86.4 fL (ref 80.0–100.0)
Platelets: 273 10*3/uL (ref 150–400)
RBC: 3.24 MIL/uL — ABNORMAL LOW (ref 4.22–5.81)
RDW: 14.2 % (ref 11.5–15.5)
WBC: 6.3 10*3/uL (ref 4.0–10.5)
nRBC: 0 % (ref 0.0–0.2)

## 2023-07-31 LAB — SODIUM, URINE, RANDOM: Sodium, Ur: <10 mmol/L

## 2023-07-31 LAB — APTT
aPTT: 82 s — ABNORMAL HIGH (ref 24–36)
aPTT: 90 s — ABNORMAL HIGH (ref 24–36)

## 2023-07-31 LAB — TROPONIN I (HIGH SENSITIVITY): Troponin I (High Sensitivity): 3 ng/L (ref <18)

## 2023-07-31 LAB — HEPARIN LEVEL (UNFRACTIONATED): Heparin Unfractionated: 0.42 [IU]/mL (ref 0.30–0.70)

## 2023-07-31 MED ORDER — SENNOSIDES-DOCUSATE SODIUM 8.6-50 MG PO TABS
1.0000 | ORAL_TABLET | Freq: Two times a day (BID) | ORAL | Status: DC
  Administered 2023-07-31 – 2023-08-03 (×7): 1 via ORAL
  Filled 2023-07-31 (×7): qty 1

## 2023-07-31 MED ORDER — POLYETHYLENE GLYCOL 3350 17 G PO PACK
17.0000 g | PACK | Freq: Every day | ORAL | Status: DC
  Administered 2023-07-31 – 2023-08-03 (×4): 17 g via ORAL
  Filled 2023-07-31 (×4): qty 1

## 2023-07-31 MED ORDER — ENSURE ENLIVE PO LIQD
237.0000 mL | Freq: Three times a day (TID) | ORAL | Status: DC
  Administered 2023-07-31 – 2023-08-03 (×9): 237 mL via ORAL

## 2023-07-31 NOTE — Progress Notes (Signed)
   07/31/23 0000  BiPAP/CPAP/SIPAP  BiPAP/CPAP/SIPAP V60  Mask Type Full face mask  Mask Size Large  Set Rate 12 breaths/min  Respiratory Rate 29 breaths/min  IPAP 10 cmH20  EPAP 5 cmH2O  FiO2 (%) 40 %  Flow Rate 0 lpm  Minute Ventilation 15.4  Leak 0  Peak Inspiratory Pressure (PIP) 10  Tidal Volume (Vt) 530  Patient Home Equipment No  Auto Titrate No  Press High Alarm 30 cmH2O  Press Low Alarm 5 cmH2O  BiPAP/CPAP /SiPAP Vitals  Resp (!) 22  SpO2 96 %  MEWS Score/Color  MEWS Score 2  MEWS Score Color Yellow

## 2023-07-31 NOTE — Progress Notes (Signed)
PHARMACY - ANTICOAGULATION CONSULT NOTE  Pharmacy Consult for IV heparin Indication: recurrent PE  No Known Allergies  Patient Measurements: Height: 5\' 11"  (180.3 cm) Weight: 104.4 kg (230 lb 2.6 oz) IBW/kg (Calculated) : 75.3 Heparin Dosing Weight: 100 kg  Vital Signs: Temp: 98.4 F (36.9 C) (12/09 2300) Temp Source: Axillary (12/09 2300) BP: 110/75 (12/09 2300) Pulse Rate: 101 (12/09 2300)  Labs: Recent Labs    07/28/23 1438 07/28/23 2356 07/29/23 0616 07/29/23 1626 07/30/23 0402 07/30/23 1000 07/30/23 1716 07/31/23 0037  HGB  --   --  10.9*  --  9.4*  --   --   --   HCT  --   --  32.1*  --  28.0*  --   --   --   PLT  --   --  264  --  240  --   --   --   APTT  --    < > 58*   < > 74* 48* 50* 82*  HEPARINUNFRC  --   --  >1.10*  --  0.97*  --   --   --   CREATININE  --   --  0.99  --  0.67  --   --   --   TROPONINIHS <2  --  3  --  2  --   --   --    < > = values in this interval not displayed.    Estimated Creatinine Clearance: 159.9 mL/min (by C-G formula based on SCr of 0.67 mg/dL).   Medical History: Past Medical History:  Diagnosis Date   HIV infection (HCC)    Medical history non-contributory    Testicular cancer (HCC)     Medications:  Medications Prior to Admission  Medication Sig Dispense Refill Last Dose   acetaminophen (TYLENOL) 650 MG CR tablet Take 1,300 mg by mouth every 8 (eight) hours as needed for pain.   07/26/2023   albuterol (VENTOLIN HFA) 108 (90 Base) MCG/ACT inhaler Inhale 1 puff into the lungs every 6 (six) hours as needed for wheezing or shortness of breath.   Past Month   bictegravir-emtricitabine-tenofovir AF (BIKTARVY) 50-200-25 MG TABS tablet Take 1 tablet by mouth daily. 30 tablet 2 07/26/2023   dexamethasone (DECADRON) 4 MG tablet Take 2 tablets (8 mg total) by mouth daily. Start the day after last cisplatin dose on day 6 of chemo x 3 days.Take with food. 30 tablet 1 unknown   famotidine (PEPCID) 20 MG tablet Take 1 tablet (20  mg total) by mouth 2 (two) times daily. 30 tablet 2 07/26/2023   lidocaine (XYLOCAINE) 2 % solution Use as directed 15 mLs in the mouth or throat every 3 (three) hours as needed for mouth pain. Mix 1 to 1 ratio with Maalox to rinse for pain 100 mL 0 unknown   lidocaine-prilocaine (EMLA) cream Apply to affected area once 30 g 3 Past Week   prochlorperazine (COMPAZINE) 10 MG tablet Take 1 tablet (10 mg total) by mouth every 6 (six) hours as needed for nausea or vomiting. 30 tablet 1 07/26/2023   traZODone (DESYREL) 50 MG tablet Take 1 tablet (50 mg total) by mouth at bedtime. 90 tablet 1 07/26/2023   apixaban (ELIQUIS) 5 MG TABS tablet Take 5 mg by mouth 2 (two) times daily. ON HOLD as of 07/24/23   07/17/2023   ondansetron (ZOFRAN) 8 MG tablet Take 1 tablet (8 mg total) by mouth every 8 (eight) hours as needed for nausea or  vomiting. Start on the third day after last cisplatin dose on day 8 of chemo (Patient not taking: Reported on 07/27/2023) 30 tablet 1 Not Taking   Scheduled:   bictegravir-emtricitabine-tenofovir AF  1 tablet Oral Daily   Chlorhexidine Gluconate Cloth  6 each Topical Daily   famotidine  20 mg Oral BID   feeding supplement  237 mL Oral BID BM   HYDROmorphone   Intravenous Q4H   oxyCODONE  10 mg Oral Q12H   sodium chloride flush  10-40 mL Intracatheter Q12H   traZODone  50 mg Oral QHS   PRN: acetaminophen **OR** acetaminophen, albuterol, ALPRAZolam, diphenhydrAMINE **OR** diphenhydrAMINE, magic mouthwash w/lidocaine, naloxone **AND** sodium chloride flush, ondansetron (ZOFRAN) IV, mouth rinse, sodium chloride flush  Assessment: 12 yoM with PMH testicular cancer on cytotoxic chemo (last received 11/18), HIV, recent PE/DVT diagnosed 11/1 which had resolved on CT scan from 11/29, now returns with new bilateral submassive PE with RH strain noted on CT (Eliquis had been on hold the past few weeks d/t chemo-induced thrombocytopenia, with Plt nadir 22k). Initially started on Eliquis 10 mg  BID, but today PCCM MD concerned with clinical deterioration/possible need for thrombolysis, and Pharmacy requested to transition patient to IV heparin infusion.  Prior anticoagulation: Eliquis 10 mg PO bid; LD 12/7 @ 0900  Today, 07/31/2023: aPTT 82 therapeutic on 2900 units/hr No further episodes of hemoptysis noted  Goal of Therapy: Heparin level 0.3-0.7 units/ml aPTT 66-102 sec Monitor platelets by anticoagulation protocol: Yes  Plan: continue heparin drip at 2900 units/hr  F/u aPTT in 6hrs Daily CBC and heparin level; will need to dose heparin from aPTT until DOAC effects have dissipated Monitor for signs of bleeding or thrombosis  Arley Phenix RPh 07/31/2023, 1:22 AM

## 2023-07-31 NOTE — Progress Notes (Signed)
Steve Stewart   DOB:1991/02/06   UJ#:[id number]    ASSESSMENT & PLAN:  32 y.o. male with diagnosis of left seminoma s/p orchiectomy presented with acute PE.    Diagnosis: pT2 [id number] normal TM. 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  MRI showed benign adrenal nodule.   Treatment: Status post cycle 1 BEP   Acute PE On heparin drip.  Still on oxygen. Repeat CTA showed likely right lower lobe pulmonary infarct. Monitor for improvement before switching to oral DOAC   Pain associated with pulmonary infarct and PE On PCA, wean as able Recommend schedule bowel regimen  Acute respiratory insufficiency Due to PE as above Postpone chemotherapy this week   Pancytopenia Resolved, secondary to recent chemotherapy as expected Mild anemia and microcytosis Will check ferritin   Stage IIA seminoma Completed cycle 1 of BEP will postpone cycle 2 for 4 weeks   HIV On Biktarvy per ID  Code Status Full code   Spoke to patient about the above plan.   Thank you for the consult. Will follow with you. [name], MD 07/31/2023 8:15 AM  Subjective:  Raymond reports feeling about the same. No stomach pain. Small amount of blood on coughing. No other bleeding. No leg pain or swelling. Using PCA  Objective:  Vitals:   07/31/23 0600 07/31/23 0750  BP: (!) 103/53   Pulse: 81   Resp: 19 19  Temp:    SpO2: 97% 98%     Intake/Output Summary (Last 24 hours) at 07/31/2023 0815 Last data filed at 07/31/2023 0541 Gross per 24 hour  Intake 2324.08 ml  Output 950 ml  Net 1374.08 ml    GENERAL: alert, no distress SKIN: skin color normal LUNGS: No wheeze, rales and clear to auscultation bilaterally with normal breathing effort. On bipap HEART: regular rate & rhythm  ABDOMEN: abdomen soft, non-tender and non-distended Musculoskeletal: no lower extremity edema    Labs:  Recent Labs    07/24/23 0759 07/26/23 2253 07/29/23 0616 07/30/23 0402 07/31/23 0458   NA 138   < > 128* 124* 132*  K 3.6   < > 4.4 3.6 3.9  CL 105   < > 92* 89* 94*  CO2 27   < > 26 26 29   GLUCOSE 111*   < > 107* 114* 110*  BUN 13   < > 14 14 14   CREATININE 0.89   < > 0.99 0.67 0.82  CALCIUM 8.5*   < > 8.6* 7.8* 8.4*  GFRNONAA >60   < > >60 >60 >60  PROT 6.1*  --  7.3 6.8  --   ALBUMIN 3.6  --  3.0* 2.6*  --   AST 13*  --  13* 16  --   ALT 28  --  23 21  --   ALKPHOS 53  --  70 63  --   BILITOT 0.3  --  0.4 0.4  --    < > = values in this interval not displayed.    Studies:  CT Angio Chest Pulmonary Embolism (PE) W or WO Contrast  Addendum Date: 07/29/2023   ADDENDUM REPORT: 07/29/2023 06:52 ADDENDUM: Bolus timing on the current study is markedly suboptimal for the pulmonary arteries. Within this limitation, the right lower lobe pulmonary embolus is not substantially changed since the 07/27/2023 exam. Pulmonary embolus seen previously in the left upper lobe is not evident on the current exam, likely due to bolus timing. Electronically Signed  By: Kennith Center M.D.   On: 07/29/2023 06:52   Result Date: 07/29/2023 CLINICAL DATA:  Pulmonary embolus on CT yesterday. EXAM: CT ANGIOGRAPHY CHEST WITH CONTRAST TECHNIQUE: Multidetector CT imaging of the chest was performed using the standard protocol during bolus administration of intravenous contrast. Multiplanar CT image reconstructions and MIPs were obtained to evaluate the vascular anatomy. RADIATION DOSE REDUCTION: This exam was performed according to the departmental dose-optimization program which includes automated exposure control, adjustment of the mA and/or kV according to patient size and/or use of iterative reconstruction technique. CONTRAST:  75mL OMNIPAQUE IOHEXOL 350 MG/ML SOLN COMPARISON:  07/27/2023 FINDINGS: Cardiovascular: The heart size is normal. No substantial pericardial effusion. No thoracic aortic aneurysm. Right Port-A-Cath tip is positioned in the right atrium. Mediastinum/Nodes: 13 mm subcarinal lymph  node is more prominent than on yesterday's study. No left hilar lymphadenopathy. No definite right hilar lymphadenopathy. The esophagus has normal imaging features. There is no axillary lymphadenopathy. Lungs/Pleura: Since yesterday's study, the patient has developed consolidative and ground-glass airspace disease in the right lower lobe with right lower lobe interlobular septal thickening. Given the right lower lobe pulmonary embolus on yesterday's exam, findings likely reflect right lower lobe pulmonary infarct. Subsegmental atelectasis is seen in the right upper lobe and left base although fine detail is obscured by breathing motion. No substantial pleural effusion. Upper Abdomen: Visualized portion of the upper abdomen shows no acute findings. Musculoskeletal: No worrisome lytic or sclerotic osseous abnormality. Review of the MIP images confirms the above findings. IMPRESSION: 1. Since yesterday's study, the patient has developed progressive consolidative and ground-glass airspace disease in the right lower lobe with right lower lobe interlobular septal thickening. Given the right lower lobe pulmonary embolus , findings likely reflect right lower lobe pulmonary infarct. 2. 13 mm subcarinal lymph node is more prominent than on yesterday's study. This is likely reactive. 3. Areas of subsegmental atelectasis in the lung bases bilaterally. Electronically Signed: By: Kennith Center M.D. On: 07/28/2023 13:43   DG CHEST PORT 1 VIEW  Result Date: 07/28/2023 CLINICAL DATA:  Respiratory distress. EXAM: PORTABLE CHEST 1 VIEW COMPARISON:  07/26/2023.  CT, 07/28/2023 at 12:52 p.m. FINDINGS: Cardiac silhouette normal in size. Stable right anterior chest wall Port-A-Cath. Low lung volumes. Hazy airspace opacity at the right lung base mostly obscures the right hemidiaphragm. Mild opacity noted at the medial left lung base. No pneumothorax. IMPRESSION: 1. Low lung volumes. 2. Right greater than left lung base opacity without  convincing change from the CT performed earlier today. Electronically Signed   By: Amie Portland M.D.   On: 07/28/2023 17:25   ECHOCARDIOGRAM LIMITED  Result Date: 07/28/2023    ECHOCARDIOGRAM LIMITED REPORT   Patient Name:   BUSH BUEHRING Date of Exam: 07/28/2023 Medical Rec #:  [id number]            Height:       71.0 in Accession #:    [id number]           Weight:       256.0 lb Date of Birth:  Aug 01, 1991            BSA:          2.342 m Patient Age:    32 years             BP:           132/76 mmHg Patient Gender: M  HR:           110 bpm. Exam Location:  Inpatient Procedure: Limited Echo Indications:    RV Strain  History:        Patient has prior history of Echocardiogram examinations, most                 recent 07/27/2023.  Sonographer:    Darlys Gales Referring Phys: [id number] MURALI RAMASWAMY IMPRESSIONS  1. Limited echo with poor quality images.  2. Left ventricular ejection fraction, by estimation, is 55 to 60%. The left ventricle has normal function.  3. Right ventricular systolic function is normal. The right ventricular size is normal.  4. The aortic valve is tricuspid. FINDINGS  Left Ventricle: Left ventricular ejection fraction, by estimation, is 55 to 60%. The left ventricle has normal function. The left ventricular internal cavity size was normal in size. Right Ventricle: The right ventricular size is normal. Right vetricular wall thickness was not assessed. Right ventricular systolic function is normal. Pericardium: There is no evidence of pericardial effusion. Tricuspid Valve: The tricuspid valve is not assessed. Aortic Valve: The aortic valve is tricuspid. IAS/Shunts: The interatrial septum was not well visualized. Additional Comments: Limited echo with poor quality images.  RIGHT VENTRICLE RV Basal diam:  3.20 cm RV Mid diam:    3.30 cm RV S prime:     19.70 cm/s TAPSE (M-mode): 2.3 cm Charlton Haws MD Electronically signed by Charlton Haws MD Signature Date/Time:  07/28/2023/3:10:07 PM    Final    VAS [place] LOWER EXTREMITY VENOUS (DVT)  Result Date: 07/27/2023  Lower Venous DVT Study Patient Name:  DUB SOLTANI  Date of Exam:   07/27/2023 Medical Rec #: [id number]             Accession #:    [id number] Date of Birth: July 13, 1991             Patient Gender: M Patient Age:   68 years Exam Location:  [facility] Procedure:      VAS [place] LOWER EXTREMITY VENOUS (DVT) Referring Phys: Lyda Perone --------------------------------------------------------------------------------  Indications: Pulmonary embolism.  Risk Factors: Confirmed PE DVT. Anticoagulation: Eliquis. Comparison Study: 06/21/2023 - RIGHT:                   - No evidence of common femoral vein obstruction.                    LEFT:                   - Findings consistent with acute deep vein thrombosis                   involving the left                   popliteal vein, left posterior tibial veins, and left peroneal                   veins.                   - No cystic structure found in the popliteal fossa. Performing Technologist: Chanda Busing RVT  Examination Guidelines: A complete evaluation includes B-mode imaging, spectral Doppler, color Doppler, and power Doppler as needed of all accessible portions of each vessel. Bilateral testing is considered an integral part of a complete examination. Limited examinations for reoccurring indications may be performed as noted. The reflux portion of the exam  is performed with the patient in reverse Trendelenburg.  +---------+---------------+---------+-----------+----------+--------------+ RIGHT    CompressibilityPhasicitySpontaneityPropertiesThrombus Aging +---------+---------------+---------+-----------+----------+--------------+ CFV      Full           Yes      Yes                                 +---------+---------------+---------+-----------+----------+--------------+ SFJ      Full                                                         +---------+---------------+---------+-----------+----------+--------------+ FV Prox  Full                                                        +---------+---------------+---------+-----------+----------+--------------+ FV Mid   Full                                                        +---------+---------------+---------+-----------+----------+--------------+ FV DistalFull                                                        +---------+---------------+---------+-----------+----------+--------------+ PFV      Full                                                        +---------+---------------+---------+-----------+----------+--------------+ POP      Full           Yes      Yes                                 +---------+---------------+---------+-----------+----------+--------------+ PTV      Full                                                        +---------+---------------+---------+-----------+----------+--------------+ PERO     Full                                                        +---------+---------------+---------+-----------+----------+--------------+   +---------+---------------+---------+-----------+----------+--------------+ LEFT     CompressibilityPhasicitySpontaneityPropertiesThrombus Aging +---------+---------------+---------+-----------+----------+--------------+ CFV      Full           Yes      Yes                                 +---------+---------------+---------+-----------+----------+--------------+  SFJ      Full                                                        +---------+---------------+---------+-----------+----------+--------------+ FV Prox  Full                                                        +---------+---------------+---------+-----------+----------+--------------+ FV Mid   Partial        Yes      Yes                  Acute           +---------+---------------+---------+-----------+----------+--------------+ FV DistalNone           No       No                   Acute          +---------+---------------+---------+-----------+----------+--------------+ PFV      Full                                                        +---------+---------------+---------+-----------+----------+--------------+ POP      None           No       No                   Acute          +---------+---------------+---------+-----------+----------+--------------+ PTV      None                                         Acute          +---------+---------------+---------+-----------+----------+--------------+ PERO     None                                         Acute          +---------+---------------+---------+-----------+----------+--------------+ Soleal   None                                         Acute          +---------+---------------+---------+-----------+----------+--------------+ Gastroc  None                                         Acute          +---------+---------------+---------+-----------+----------+--------------+ SSV      Full                                                        +---------+---------------+---------+-----------+----------+--------------+  Summary: RIGHT: - There is no evidence of deep vein thrombosis in the lower extremity.  - No cystic structure found in the popliteal fossa.  LEFT: - Findings consistent with acute deep vein thrombosis involving the left femoral vein, left popliteal vein, left posterior tibial veins, and left peroneal veins. Findings consistent with acute intramuscular thrombosis involving the left gastrocnemius veins, and left soleal veins. - No cystic structure found in the popliteal fossa.  *See table(s) above for measurements and observations. Electronically signed by Carolynn Sayers on 07/27/2023 at 2:16:43 PM.    Final    ECHOCARDIOGRAM LIMITED  Result  Date: 07/27/2023    ECHOCARDIOGRAM LIMITED REPORT   Patient Name:   HANI BLADOW Date of Exam: 07/27/2023 Medical Rec #:  [id number]            Height:       71.0 in Accession #:    [id number]           Weight:       256.0 lb Date of Birth:  [birth year]            BSA:          2.342 m Patient Age:    32 years             BP:           119/78 mmHg Patient Gender: M                    HR:           87 bpm. Exam Location:  Inpatient Procedure: Limited Echo, Cardiac Doppler and Color Doppler Indications:    I26.02 Pulmonary embolus  History:        Patient has prior history of Echocardiogram examinations, most                 recent 06/23/2023. Signs/Symptoms:Shortness of Breath and                 Dyspnea. Pulmonary embolus. Cancer. Cor Pulmonale.  Sonographer:    Sheralyn Boatman RDCS Referring Phys: [id number] JARED M GARDNER  Sonographer Comments: Technically difficult study due to poor echo windows. Patient in pain, could not turn. IMPRESSIONS  1. Left ventricular ejection fraction, by estimation, is 60 to 65%. The left ventricle has normal function. The left ventricle has no regional wall motion abnormalities.  2. Right ventricular systolic function is normal. The right ventricular size is moderately enlarged. Tricuspid regurgitation signal is inadequate for assessing PA pressure.  3. No evidence of mitral valve regurgitation.  4. The inferior vena cava is normal in size with greater than 50% respiratory variability, suggesting right atrial pressure of 3 mmHg. Conclusion(s)/Recommendation(s): RV does look more dilated compared to prior study; c/f RV strain. FINDINGS  Left Ventricle: Left ventricular ejection fraction, by estimation, is 60 to 65%. The left ventricle has normal function. The left ventricle has no regional wall motion abnormalities. The left ventricular internal cavity size was normal in size. Right Ventricle: The right ventricular size is moderately enlarged. Right ventricular systolic function is normal.  Tricuspid regurgitation signal is inadequate for assessing PA pressure. Right Atrium: Right atrial size was normal in size. Tricuspid Valve: Tricuspid valve regurgitation is not demonstrated. Venous: The inferior vena cava is normal in size with greater than 50% respiratory variability, suggesting right atrial pressure of 3 mmHg. Additional Comments: Spectral Doppler performed. Color Doppler performed.  LEFT VENTRICLE PLAX 2D LVIDd:  4.30 cm LVIDs:         2.90 cm LV PW:         1.00 cm LV IVS:        1.10 cm LVOT diam:     2.40 cm LV SV:         66 LV SV Index:   28 LVOT Area:     4.52 cm  LV Volumes (MOD) LV vol d, MOD A2C: 68.2 ml LV vol d, MOD A4C: 69.0 ml LV vol s, MOD A2C: 21.6 ml LV vol s, MOD A4C: 21.8 ml LV SV MOD A2C:     46.6 ml LV SV MOD A4C:     69.0 ml LV SV MOD BP:      47.7 ml RIGHT VENTRICLE             IVC RV S prime:     10.90 cm/s  IVC diam: 1.90 cm TAPSE (M-mode): 2.0 cm RIGHT ATRIUM           Index RA Area:     14.90 cm RA Volume:   40.20 ml  17.17 ml/m  AORTIC VALVE LVOT Vmax:   102.00 cm/s LVOT Vmean:  64.600 cm/s LVOT VTI:    0.145 m  AORTA Ao Asc diam: 3.30 cm MITRAL VALVE MV Area (PHT): 4.60 cm    SHUNTS MV Decel Time: 165 msec    Systemic VTI:  0.14 m MV E velocity: 82.40 cm/s  Systemic Diam: 2.40 cm MV A velocity: 78.40 cm/s MV E/A ratio:  1.05 [place] signed by Carolan Clines Signature Date/Time: 07/27/2023/12:17:48 PM    Final    CT Angio Chest PE W and/or Wo Contrast  Result Date: 07/27/2023 CLINICAL DATA:  High probability for PE. EXAM: CT ANGIOGRAPHY CHEST WITH CONTRAST TECHNIQUE: Multidetector CT imaging of the chest was performed using the standard protocol during bolus administration of intravenous contrast. Multiplanar CT image reconstructions and MIPs were obtained to evaluate the vascular anatomy. RADIATION DOSE REDUCTION: This exam was performed according to the departmental dose-optimization program which includes automated exposure control,  adjustment of the mA and/or kV according to patient size and/or use of iterative reconstruction technique. CONTRAST:  80mL OMNIPAQUE IOHEXOL 350 MG/ML SOLN COMPARISON:  CT angiogram chest 07/19/2028. FINDINGS: Cardiovascular: There are pulmonary emboli within lobar, segmental and subsegmental branches of the right lower lobe there also pulmonary emboli within subsegmental branches of the left upper lobe. Aorta is normal in size. Heart is normal in size. There is no pericardial effusion. Right chest port catheter tip ends in the distal SVC. Mediastinum/Nodes: No enlarged mediastinal, hilar, or axillary lymph nodes. Thyroid gland, trachea, and esophagus demonstrate no significant findings. Lungs/Pleura: Peripheral patchy ground-glass opacities in the right lower lobe are favored as pulmonary infarcts. There is minimal atelectasis in the right middle lobe. No pleural effusion or pneumothorax. Upper Abdomen: No acute abnormality. Musculoskeletal: No chest wall abnormality. No acute or significant osseous findings. Review of the MIP images confirms the above findings. IMPRESSION: 1. Bilateral pulmonary emboli, right greater than left. Positive for acute PE with CTevidence of right heart strain (RV/LV Ratio = 1.3) consistent with at least submassive (intermediate risk) PE. The presence of right heart strain has been associated with an increased risk of morbidity and mortality. 2. Peripheral patchy ground-glass opacities in the right lower lobe are favored as pulmonary infarcts. These results were called by telephone at the time of interpretation on 07/27/2023 at 12:39 am to provider MATTHEW TRIFAN ,  who verbally acknowledged these results. Electronically Signed   By: Darliss Cheney M.D.   On: 07/27/2023 00:41   DG Chest 2 View  Result Date: 07/26/2023 CLINICAL DATA:  Shortness of breath. History of DVT and pulmonary embolus. Off of Eliquis for couple of weeks. EXAM: CHEST - 2 VIEW COMPARISON:  07/20/2023 FINDINGS: Power  port type central venous catheter with tip over the cavoatrial junction region. No pneumothorax. Shallow inspiration. Linear atelectasis or infiltration seen in the right lung base. No pleural effusions. No pneumothorax. Mediastinal contours appear intact. Heart size and pulmonary vascularity are normal. IMPRESSION: Shallow inspiration with linear atelectasis or infiltration in the right lung base. Electronically Signed   By: Burman Nieves M.D.   On: 07/26/2023 23:24   CT Angio Chest PE W and/or Wo Contrast  Result Date: 07/20/2023 CLINICAL DATA:  Short of breath and chest pain beginning late last night. Patient stopped taking Eliquis on Tuesday. EXAM: CT ANGIOGRAPHY CHEST WITH CONTRAST TECHNIQUE: Multidetector CT imaging of the chest was performed using the standard protocol during bolus administration of intravenous contrast. Multiplanar CT image reconstructions and MIPs were obtained to evaluate the vascular anatomy. RADIATION DOSE REDUCTION: This exam was performed according to the departmental dose-optimization program which includes automated exposure control, adjustment of the mA and/or kV according to patient size and/or use of iterative reconstruction technique. CONTRAST:  75mL OMNIPAQUE IOHEXOL 350 MG/ML SOLN COMPARISON:  06/22/2023. FINDINGS: Cardiovascular: Pulmonary arteries are well opacified. No evidence of a pulmonary embolism. Previously noted right-sided pulmonary emboli have resolved. Heart is normal in size and configuration. No pericardial effusion. Great vessels are normal in caliber. No aortic dissection or atherosclerosis. Arch branch vessels are widely patent. Mediastinum/Nodes: No neck base, mediastinal or hilar masses. No enlarged lymph nodes. Trachea and esophagus are unremarkable. Lungs/Pleura: Mild ground-glass opacities in the lower lobes suspected to be atelectasis. More confluent type opacity noted in the lower lobes on the prior study resolved. Remainder of the lungs is  clear. No pleural effusion or pneumothorax. Upper Abdomen: No acute findings. 1.9 cm gallstone, dependent aspect of a distended gallbladder. No gallbladder wall thickening or adjacent inflammation. Musculoskeletal: No chest wall abnormality. No acute or significant osseous findings. Review of the MIP images confirms the above findings. IMPRESSION: 1. No evidence of a pulmonary embolism. Previously noted right-sided pulmonary emboli have resolved. 2. No acute findings. 3. Cholelithiasis. 4. Mild ground-glass opacities in the lower lobes suspected to be atelectasis. Electronically Signed   By: Amie Portland M.D.   On: 07/20/2023 09:15   DG Chest 2 View  Result Date: 07/20/2023 CLINICAL DATA:  Chest pain EXAM: CHEST - 2 VIEW COMPARISON:  06/22/2023 FINDINGS: Porta catheter on the right with tip at the upper cavoatrial junction. Normal heart size and mediastinal contours. No acute infiltrate or edema. No effusion or pneumothorax. No acute osseous findings. IMPRESSION: No active cardiopulmonary disease. Electronically Signed   By: Tiburcio Pea M.D.   On: 07/20/2023 06:59

## 2023-07-31 NOTE — Progress Notes (Signed)
Initial Nutrition Assessment  DOCUMENTATION CODES:   Obesity unspecified  INTERVENTION:  - Regular diet.  - Ensure Plus High Protein po TID, each supplement provides 350 kcal and 20 grams of protein. - Encourage intake at all meals, emphasizing protein rich food sources. - Provided high-protein nutrition therapy diet education with handouts.  - Monitor weight trends.   NUTRITION DIAGNOSIS:   Increased nutrient needs related to chronic illness, cancer and cancer related treatments as evidenced by estimated needs.  GOAL:   Patient will meet greater than or equal to 90% of their needs  MONITOR:   PO intake, Supplement acceptance, Weight trends  REASON FOR ASSESSMENT:   Consult Poor PO ("  Needs protein shake on discharge. Patient reports decrease appetite while on chemotherapy and sore mouth benefit from protein shake")  ASSESSMENT:   32 y.o. male with PMH seminoma s/p orchiectomy currently on bleomycin for seminoma (cycle 1 completed 11/18 - 12/3), and HIV on HAART who presented with left leg pain and SOB. Admitted for acute pulmonary embolism.   Patient reports a UBW of 252-256# and does not feel he lost any weight recently. Weight without significant changes since weight history began in September. Patient weighed at both 256# then 230# this admission but patient feels 230# is too low. Would like a standing weight today, placed order and discussed with RN.  Prior to cancer diagnosis was only eating 1 small meal at breakfast and main meal was dinner.  Since being on treatment, he has been eating 3 meals a day. Usually cereal with milk or a bacon and egg sandwich for breakfast, a pre-mixed salad, ham sandwich, or a chicken/egg salad sandwich for lunch, and a crockpot dinner or steak with a potato and asparagus for dinner.   Reports eating fairly well since admission. Documented to be consuming 50% of meals. Shares his appetite is somewhat decreased. Hasn't had a BM since 12/6  which may be contributing. Thankfully, really likes the vanilla Ensure and drinking twice daily. Agreeable to increase to TID. Patient also inquiring on what foods would be good for him to eat. Discussed increased kcal and protein needs and provided "Tips for Increasing Protein" and "High-Protein Foods List" handouts from the [employer] of [employer] and reviewed. Patient appreciative.    Medications reviewed and include: Miralax, Senokot  Labs reviewed:  Na 132   NUTRITION - FOCUSED PHYSICAL EXAM:  Flowsheet Row Most Recent Value  Orbital Region No depletion  Upper Arm Region No depletion  Thoracic and Lumbar Region No depletion  Buccal Region No depletion  Temple Region Mild depletion  Clavicle Bone Region No depletion  Clavicle and Acromion Bone Region No depletion  Scapular Bone Region Unable to assess  Dorsal Hand No depletion  Patellar Region No depletion  Anterior Thigh Region No depletion  Posterior Calf Region No depletion  Edema (RD Assessment) Mild  Hair Reviewed  Eyes Reviewed  Mouth Reviewed  Skin Reviewed  Nails Reviewed       Diet Order:   Diet Order             Diet regular Room service appropriate? Yes; Fluid consistency: Thin  Diet effective now                   EDUCATION NEEDS:  Education needs have been addressed  Skin:  Skin Assessment: Reviewed RN Assessment  Last BM:  12/9  Height:  Ht Readings from Last 1 Encounters:  07/28/23 5\' 11"  (1.803 m)  Weight:  Wt Readings from Last 1 Encounters:  07/28/23 104.4 kg   Ideal Body Weight:  78.18 kg  BMI:  Body mass index is 32.1 kg/m.  Estimated Nutritional Needs:  Kcal:  9629-5284 kcals Protein:  115-140 grams Fluid:  >/= 2.1L    Shelle Iron RD, LDN Contact via Secure Chat.

## 2023-07-31 NOTE — Progress Notes (Addendum)
NAME:  Steve Stewart, MRN:  [id number], DOB:  1991/03/19, LOS: 4 ADMISSION DATE:  07/26/2023, CONSULTATION DATE:  07/28/23 REFERRING MD:  Dr Hughie Closs, CHIEF COMPLAINT:  Acute PE worsening     BRIEF   32 year old male with history of HIV on HAART treatment, seminoma s/p orchiectomy.  Suffered his first pulmonary embolism 06/22/2023 with left lower extremity DVT and was on Eliquis.  Seminoma diagnosed 04/28/2023 following 52-month history of testicular mass.  Status post orchiectomy 05/02/2023.  Then started testicular bleomycin every 3 weeks?  First dose 07/09/2023  On 06/21/2023 developed left lower extremity DVT and then on 06/22/2023 presented with acute pulmonary embolism right lower lobe main artery with extension into posterior basilar segmental arteries and distal PE without any evidence of right heart strain.  He was treated with IV heparin and then switched to Eliquis and then discharged on 06/25/23.  During this admission routine screening for HIV turned positive and he was started subsequently on Hart treatment  Followed with oncology 07/05/2023 and plan to start bleomycin 07/09/2023 with plans to hold apixaban 11 days after starting chemo he was advised to quit smoking.  His apixaban was then held on 07/17/2023 following oncology visit.  On this day his platelet count dropped to 69,000.  3 days later his platelet count dropped to 22,000 on 07/20/2023.  A CT angiogram chest on the same day did not show evidence of pulmonary embolism  But he presented to the emergency department on 07/27/2023 with acute pain behind left knee and right shoulder blade and pleuritic pain very similar to prior PE.  CT angiogram chest showed right lower lobe PE.  Radiologist interpreted this for submassive PE with RV LV ratio greater than 1.3 [subsequent interpretation by Dr. Malachy Moan on 07/28/2023 of IR is that this PE did not have any RV strain as a distal PE].  Outpatient Eliquis was resumed.  His  thrombocytopenia nearly resolved at the time of admission to 1 40012/6/24.  However on 07/28/2023 patient started having worsening and new onset acute hypoxemic respiratory failure requiring 2-2 L oxygen.  Interventional radiology recommended repeat CT angiogram chest that shows right lower lobe potential infarct.  Patient is having pleuritic pain.  Last dose of Eliquis 9 AM 07/28/2023  Repeat echocardiogram and pulmonary embolism biomarkers of troponin lactic acid and BNP pending  Past Medical History:   has a past medical history of HIV infection (HCC), Medical history non-contributory, and Testicular cancer (HCC).   has a past surgical history that includes No past surgeries; Orchiectomy (Left, 05/02/2023); and IR IMAGING GUIDED PORT INSERTION (06/18/2023).   Significant Hospital Events:  07/26/2023 - admit with PE,  07/28/23 - PCCM Consult for ongoing acute respiratory failure, right lower lobe infarct/PE, heparin gtt 12/9 5-6L [place] during day, BIPAP at night, on heparin gtt, scant blood tinged/maroon sputum, pain controlled on diluadid gtt  12/10 Utilized BIPAP overnight, pain controlled, sputum clearing/slightly blood tinged   Interim History / Subjective:  Still having ongoing pleuritic pain with coughing but managed with diluadid gtt   Objective   Blood pressure (!) 103/53, pulse 81, temperature 98.1 F (36.7 C), temperature source Axillary, resp. rate 19, height 5\' 11"  (1.803 m), weight 104.4 kg, SpO2 98%.    FiO2 (%):  [40 %] 40 %   Intake/Output Summary (Last 24 hours) at 07/31/2023 1016 Last data filed at 07/31/2023 0541 Gross per 24 hour  Intake 2324.08 ml  Output 950 ml  Net 1374.08 ml   Filed  Weights   07/26/23 2241 07/28/23 1620  Weight: 116.1 kg 104.4 kg    Examination: General: acutely ill adult male, lying on SD bed  HEENT: Normocephalic, Pink MM, on BIPAP  Pulm: Clear in upper bases, clear/diminished lower bases, blood tinged sputum clearing per patient, No  respiratory distress  Card: s1,s2, RRR, MRG  Abd: soft, active, BS  Neuro: AOx4, follows commands  Extremities: moves all extremities with ease, non pitting edema GU: Deferred  Resolved Hospital Problem list   N/a   Assessment & Plan:  Acute Hypoxic Respiratory Failure Acute Pulmonary Embolism     Acute respiratory failure secondary to right lower lobe infarct PE Risk factors - stopping eliquis, obestity, Seminoma and recent PE hx and smoking See HPI 12/6 Vas US-+ left acute DVT involving the left femoral vein/popliteal/posterior tibial veins.  Originally treated with Eliquis 12/6 - 07/28/23>> Course complicated 07/28/23 with right lower lobe pulmonary infarct acute hypoxic respiratory failure -> Eliquis switched IV heparin P:   Consider transitioning from IV heparin back to eliquis Continue [place], wean down O2 as tolerated  Continue IS, flutter valve, mobilize to chair Continue to educate in regards to smoking cessation  Continue albuterol nebs prn, BIPAP prn/HS PCCM will sign off, can transfer to progressive   Right pleuritic pain in setting of Acute PE/right lower lobe infarct P: Consider transitioning off diluadid gtt, and management with oral adjuncts within the next 24hrs  Continue end-tidal gtt, CO2 monitoring   HIV on HAART Incidental HIV diagnosis while in hospital last admit 06/22/23 P:  Continue Biktarvy   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  S/p bleomycin 07/09/23>> thrombocytopenia 07/17/23 - 07/24/23  Pantocytopenia continues to resolve  No signs of bleeding 12/10 Plt 273, Hgb 9.2, WBC 6.3  P:  Continue to monitor signs for bleeding Transfuse if hgb < 7   Hyponatremia in setting of PE/right pulmonary infarct Na improved from 124 to 132  Asymptomatic Patient oral intake adequate  Urine Na <10  Urine Osmolality 395  Uric acid serum 4.4  P:  Continue to trend and follow Na daily Urine Uric acid pending  Stage II Seminoma  Hx of testicular mass>> seminoma  diagnosed 9/7  orchiectomy 05/02/2023 P: Oncology following, appreciate assitance Continue to follow up outpatient    Best practice (daily eval):  Per [facility]  Disposition: Can transfer to progressive    Hazel Sams AGACNP-BC   [facility] 07/31/2023, 10:16 AM  Please see [email or web address] for pager details.  From 7A-7P if no response, please call [phone]. After hours, please call ELink [phone].

## 2023-07-31 NOTE — Plan of Care (Signed)

## 2023-07-31 NOTE — Progress Notes (Signed)
PHARMACY - ANTICOAGULATION CONSULT NOTE  Pharmacy Consult for IV heparin Indication: recurrent PE  No Known Allergies  Patient Measurements: Height: 5\' 11"  (180.3 cm) Weight: 104.4 kg (230 lb 2.6 oz) IBW/kg (Calculated) : 75.3 Heparin Dosing Weight: 100 kg  Vital Signs: Temp: 98.4 F (36.9 C) (12/09 2300) Temp Source: Axillary (12/09 2300) BP: 103/53 (12/10 0600) Pulse Rate: 81 (12/10 0600)  Labs: Recent Labs    07/29/23 0616 07/29/23 1626 07/30/23 0402 07/30/23 1000 07/30/23 1716 07/31/23 0037 07/31/23 0458 07/31/23 0759  HGB 10.9*  --  9.4*  --   --   --  9.2*  --   HCT 32.1*  --  28.0*  --   --   --  28.0*  --   PLT 264  --  240  --   --   --  273  --   APTT 58*   < > 74*   < > 50* 82*  --  90*  HEPARINUNFRC >1.10*  --  0.97*  --   --   --  0.42  --   CREATININE 0.99  --  0.67  --   --   --  0.82  --   TROPONINIHS 3  --  2  --   --   --  3  --    < > = values in this interval not displayed.    Estimated Creatinine Clearance: 156 mL/min (by C-G formula based on SCr of 0.82 mg/dL).   Medical History: Past Medical History:  Diagnosis Date   HIV infection (HCC)    Medical history non-contributory    Testicular cancer (HCC)     Medications:  Medications Prior to Admission  Medication Sig Dispense Refill Last Dose   acetaminophen (TYLENOL) 650 MG CR tablet Take 1,300 mg by mouth every 8 (eight) hours as needed for pain.   07/26/2023   albuterol (VENTOLIN HFA) 108 (90 Base) MCG/ACT inhaler Inhale 1 puff into the lungs every 6 (six) hours as needed for wheezing or shortness of breath.   Past Month   bictegravir-emtricitabine-tenofovir AF (BIKTARVY) 50-200-25 MG TABS tablet Take 1 tablet by mouth daily. 30 tablet 2 07/26/2023   dexamethasone (DECADRON) 4 MG tablet Take 2 tablets (8 mg total) by mouth daily. Start the day after last cisplatin dose on day 6 of chemo x 3 days.Take with food. 30 tablet 1 unknown   famotidine (PEPCID) 20 MG tablet Take 1 tablet (20 mg  total) by mouth 2 (two) times daily. 30 tablet 2 07/26/2023   lidocaine (XYLOCAINE) 2 % solution Use as directed 15 mLs in the mouth or throat every 3 (three) hours as needed for mouth pain. Mix 1 to 1 ratio with Maalox to rinse for pain 100 mL 0 unknown   lidocaine-prilocaine (EMLA) cream Apply to affected area once 30 g 3 Past Week   prochlorperazine (COMPAZINE) 10 MG tablet Take 1 tablet (10 mg total) by mouth every 6 (six) hours as needed for nausea or vomiting. 30 tablet 1 07/26/2023   traZODone (DESYREL) 50 MG tablet Take 1 tablet (50 mg total) by mouth at bedtime. 90 tablet 1 07/26/2023   apixaban (ELIQUIS) 5 MG TABS tablet Take 5 mg by mouth 2 (two) times daily. ON HOLD as of 07/24/23   07/17/2023   ondansetron (ZOFRAN) 8 MG tablet Take 1 tablet (8 mg total) by mouth every 8 (eight) hours as needed for nausea or vomiting. Start on the third day after last cisplatin dose  on day 8 of chemo (Patient not taking: Reported on 07/27/2023) 30 tablet 1 Not Taking   Scheduled:   bictegravir-emtricitabine-tenofovir AF  1 tablet Oral Daily   Chlorhexidine Gluconate Cloth  6 each Topical Daily   famotidine  20 mg Oral BID   feeding supplement  237 mL Oral BID BM   HYDROmorphone   Intravenous Q4H   oxyCODONE  10 mg Oral Q12H   sodium chloride flush  10-40 mL Intracatheter Q12H   traZODone  50 mg Oral QHS   PRN: acetaminophen **OR** acetaminophen, albuterol, ALPRAZolam, diphenhydrAMINE **OR** diphenhydrAMINE, magic mouthwash w/lidocaine, naloxone **AND** sodium chloride flush, ondansetron (ZOFRAN) IV, mouth rinse, sodium chloride flush  Assessment: 28 yoM with PMH testicular cancer on cytotoxic chemo (last received 11/18), HIV, recent PE/DVT diagnosed 11/1 which had resolved on CT scan from 11/29, now returns with new bilateral submassive PE with RH strain noted on CT (Eliquis had been on hold the past few weeks d/t chemo-induced thrombocytopenia, with Plt nadir 22k). Initially started on Eliquis 10 mg BID,  but today PCCM MD concerned with clinical deterioration/possible need for thrombolysis, and Pharmacy requested to transition patient to IV heparin infusion.  Prior anticoagulation: Eliquis 10 mg PO bid; LD 12/7 @ 0900  Today, 07/31/2023: aPTT 90, remains therapeutic on 2900 units/hr Heparin level 0.42, therapeutic and appears to be correlating with aPTT Will only monitor heparin level from now on given its correlation with aPTT  Hgb 9.2, plts 273--stable No further episodes of hemoptysis noted Potentially switching back to DOAC today pending CCM discussion  Goal of Therapy: Heparin level 0.3-0.7 units/ml aPTT 66-102 sec Monitor platelets by anticoagulation protocol: Yes  Plan: Continue heparin drip at 2900 units/hr  Daily heparin level, CBC Monitor for signs of bleeding or thrombosis F/u DOAC plans    Cherylin Mylar, PharmD Clinical Pharmacist  12/10/20248:33 AM

## 2023-07-31 NOTE — Progress Notes (Signed)
   07/31/23 2307  BiPAP/CPAP/SIPAP  BiPAP/CPAP/SIPAP Pt Type Adult  BiPAP/CPAP/SIPAP V60  Mask Type Full face mask  Set Rate 12 breaths/min  Respiratory Rate 26 breaths/min  IPAP 10 cmH20  EPAP 5 cmH2O  FiO2 (%) 40 %  Minute Ventilation 13.9  Leak 0  Peak Inspiratory Pressure (PIP) 10  Tidal Volume (Vt) 528  Patient Home Equipment No  Auto Titrate No  Press High Alarm 25 cmH2O  Press Low Alarm 5 cmH2O  CPAP/SIPAP surface wiped down Yes  BiPAP/CPAP /SiPAP Vitals  Pulse Rate 94  Resp (!) 26  BP 132/60  SpO2 97 %  Bilateral Breath Sounds Diminished;Clear  MEWS Score/Color  MEWS Score 2  MEWS Score Color Yellow   Pt placed on bipap per his request.  RN aware.  Pt stated he breathes and sleeps better with it on.  No increased wob or sob noted.  HR94, RR26, spo2 97% on bipap

## 2023-07-31 NOTE — Progress Notes (Signed)
Progress Note    Steve Stewart   ZOX:[id number]  DOB: 07-29-1991  DOA: 07/26/2023     4 PCP: Patient, No Pcp Per  Initial CC: left leg pain, SOB  Hospital Course: Steve Stewart is a 32 y.o. 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  Pt currently on bleomycin for seminoma (cycle 1 completed 11/18 - 12/3). Pt had been on eliquis for DVT/PE but this was put on hold this past week due to severe thrombocytopenia with platelets in the 20s.   Repeat CT angio chest performed 07/20/2023 and showed resolution of previously seen right sided pulmonary emboli. He again underwent CT angio chest on 07/27/2023 on admission which showed bilateral pulmonary emboli right greater than left and peripheral groundglass opacities in the right lower lobe favored as pulmonary infarcts. Lower extremity duplex also revealed left lower extremity acute DVT involving multiple veins. Echo was performed on 07/27/2023 which showed normal RV function with moderately enlarged right ventricle.  Echo was again repeated on 07/28/2023 which showed normal RV function and normal RV size at that time. He has been followed by pulmonology as well. He has been on a heparin drip with plans for transitioning to Eliquis on 07/31/2023.  Interval History:  No events overnight. Still using PCA a bit and no BM. Adding bowel regimen today.  Otherwise doing about the same. LLL pain better, still having pleuritic pain suspected from infarct.   Assessment and Plan: * Acute pulmonary embolism with acute cor pulmonale (HCC) - CT angio chest performed on 07/27/2023 showing bilateral PE right greater than left along with patchy peripheral groundglass opacities in the right lower lobe concerning for pulmonary infarcts - Initial echo on 07/27/2023 showed moderate right ventricular enlargement, normal RV function.  Repeat echo on 07/28/2023 showed normalized right heart - Remains on  heparin drip with tentative plans for transitioning to Eliquis TBD after discussion with oncology - Pulmonology following - continue IS, BIPAP, and O2  Acute respiratory failure with hypoxia (HCC) - due to underlying PE, with presumed pulmonary infarct and GGO - continue O2, wean as able - not on home O2 at baseline  - IS, BIPAP  Primary seminoma of left testis (HCC) Pt on chemo with heme/onc including bleomycin - cycle 1 completed 11/18 - 12/3  HIV disease (HCC) - Recent diagnosis beginning of November 2024 - Continue Biktarvy -CD4 255 on 06/28/2023; HIV VL 117k  DVT (deep venous thrombosis) (HCC) - Duplex performed 07/27/2023: "Findings consistent with acute deep vein thrombosis involving the left femoral vein, left popliteal vein, left posterior tibial veins, and left peroneal veins. Findings consistent with acute intramuscular thrombosis involving the left gastrocnemius veins, and left soleal veins" - see PE workup  Hyponatremia - Drop noted today.  Asymptomatic - Trending for now  Pancytopenia (HCC) - Chemo effect from bleomycin -Cell counts are recovering   Old records reviewed in assessment of this patient  Antimicrobials:   DVT prophylaxis:  Heparin drip   Code Status:   Code Status: Full Code  Mobility Assessment (Last 72 Hours)     Mobility Assessment     Row Name 07/29/23 2000 07/29/23 0800 07/28/23 2000 07/28/23 1620 07/28/23 0908   Does patient have an order for bedrest or is patient medically unstable Yes- Bedfast (Level 1) - Complete No - Continue assessment No - Continue assessment No - Continue assessment No - Continue assessment   What is the highest level  of mobility based on the progressive mobility assessment? Level 3 (Stands with assist) - Balance while standing  and cannot march in place Level 3 (Stands with assist) - Balance while standing  and cannot march in place Level 3 (Stands with assist) - Balance while standing  and cannot march in place  Level 3 (Stands with assist) - Balance while standing  and cannot march in place Level 5 (Walks with assist in room/hall) - Balance while stepping forward/back and can walk in room with assist - Complete   Is the above level different from baseline mobility prior to current illness? Yes - Recommend PT order Yes - Recommend PT order Yes - Recommend PT order -- --            Barriers to discharge: none Disposition Plan:  Home Status is: Inpt  Objective: Blood pressure (!) 103/53, pulse 81, temperature 98.1 F (36.7 C), temperature source Axillary, resp. rate 19, height 5\' 11"  (1.803 m), weight 104.4 kg, SpO2 98%.  Examination:  Physical Exam Constitutional:      General: He is not in acute distress.    Appearance: Normal appearance.  HENT:     Head: Normocephalic and atraumatic.     Comments: Mild hair loss noted    Mouth/Throat:     Mouth: Mucous membranes are moist.  Eyes:     Extraocular Movements: Extraocular movements intact.  Cardiovascular:     Rate and Rhythm: Normal rate and regular rhythm.  Pulmonary:     Effort: Pulmonary effort is normal. No respiratory distress.     Breath sounds: No wheezing.     Comments: Coarse breath sounds bilaterally  Abdominal:     General: Bowel sounds are normal. There is no distension.     Palpations: Abdomen is soft.     Tenderness: There is no abdominal tenderness.  Musculoskeletal:        General: Normal range of motion.     Cervical back: Normal range of motion and neck supple.  Skin:    General: Skin is warm and dry.  Neurological:     General: No focal deficit present.     Mental Status: He is alert.  Psychiatric:        Mood and Affect: Mood normal.        Behavior: Behavior normal.      Consultants:  Pulmonology  Oncology   Procedures:    Data Reviewed: Results for orders placed or performed during the hospital encounter of 07/26/23 (from the past 24 hour(s))  APTT     Status: Abnormal   Collection Time:  07/30/23 10:00 AM  Result Value Ref Range   aPTT 48 (H) 24 - 36 seconds  APTT     Status: Abnormal   Collection Time: 07/30/23  5:16 PM  Result Value Ref Range   aPTT 50 (H) 24 - 36 seconds  Uric acid     Status: None   Collection Time: 07/30/23  5:47 PM  Result Value Ref Range   Uric Acid, Serum 4.4 3.7 - 8.6 mg/dL  APTT     Status: Abnormal   Collection Time: 07/31/23 12:37 AM  Result Value Ref Range   aPTT 82 (H) 24 - 36 seconds  Sodium, urine, random     Status: None   Collection Time: 07/31/23  3:04 AM  Result Value Ref Range   Sodium, Ur <10 mmol/L  Osmolality, urine     Status: None   Collection Time: 07/31/23  3:04  AM  Result Value Ref Range   Osmolality, Ur 395 300 - 900 mOsm/kg  CBC     Status: Abnormal   Collection Time: 07/31/23  4:58 AM  Result Value Ref Range   WBC 6.3 4.0 - 10.5 K/uL   RBC 3.24 (L) 4.22 - 5.81 MIL/uL   Hemoglobin 9.2 (L) 13.0 - 17.0 g/dL   HCT 16.1 (L) 09.6 - 04.5 %   MCV 86.4 80.0 - 100.0 fL   MCH 28.4 26.0 - 34.0 pg   MCHC 32.9 30.0 - 36.0 g/dL   RDW 40.9 81.1 - 91.4 %   Platelets 273 150 - 400 K/uL   nRBC 0.0 0.0 - 0.2 %  Heparin level (unfractionated)     Status: None   Collection Time: 07/31/23  4:58 AM  Result Value Ref Range   Heparin Unfractionated 0.42 0.30 - 0.70 IU/mL  Basic metabolic panel     Status: Abnormal   Collection Time: 07/31/23  4:58 AM  Result Value Ref Range   Sodium 132 (L) 135 - 145 mmol/L   Potassium 3.9 3.5 - 5.1 mmol/L   Chloride 94 (L) 98 - 111 mmol/L   CO2 29 22 - 32 mmol/L   Glucose, Bld 110 (H) 70 - 99 mg/dL   BUN 14 6 - 20 mg/dL   Creatinine, Ser 7.82 0.61 - 1.24 mg/dL   Calcium 8.4 (L) 8.9 - 10.3 mg/dL   GFR, Estimated >95 >62 mL/min   Anion gap 9 5 - 15  Troponin I (High Sensitivity)     Status: None   Collection Time: 07/31/23  4:58 AM  Result Value Ref Range   Troponin I (High Sensitivity) 3 <18 ng/L  APTT     Status: Abnormal   Collection Time: 07/31/23  7:59 AM  Result Value Ref Range    aPTT 90 (H) 24 - 36 seconds    I have reviewed pertinent nursing notes, vitals, labs, and images as necessary. I have ordered labwork to follow up on as indicated.  I have reviewed the last notes from staff over past 24 hours. I have discussed patient's care plan and test results with nursing staff, CM/SW, and other staff as appropriate.  Time spent: Greater than 50% of the 55 minute visit was spent in counseling/coordination of care for the patient as laid out in the A&P.   LOS: 4 days   Lewie Chamber, MD [facility] 07/31/2023, 9:03 AM

## 2023-08-01 DIAGNOSIS — I2609 Other pulmonary embolism with acute cor pulmonale: Secondary | ICD-10-CM | POA: Diagnosis not present

## 2023-08-01 LAB — BASIC METABOLIC PANEL
Anion gap: 8 (ref 5–15)
BUN: 15 mg/dL (ref 6–20)
CO2: 28 mmol/L (ref 22–32)
Calcium: 8.3 mg/dL — ABNORMAL LOW (ref 8.9–10.3)
Chloride: 93 mmol/L — ABNORMAL LOW (ref 98–111)
Creatinine, Ser: 0.82 mg/dL (ref 0.61–1.24)
GFR, Estimated: >60 mL/min (ref >60)
Glucose, Bld: 109 mg/dL — ABNORMAL HIGH (ref 70–99)
Potassium: 4.1 mmol/L (ref 3.5–5.1)
Sodium: 129 mmol/L — ABNORMAL LOW (ref 135–145)

## 2023-08-01 LAB — HEPARIN LEVEL (UNFRACTIONATED): Heparin Unfractionated: 0.54 [IU]/mL (ref 0.30–0.70)

## 2023-08-01 LAB — CBC
HCT: 27.6 % — ABNORMAL LOW (ref 39.0–52.0)
Hemoglobin: 9.3 g/dL — ABNORMAL LOW (ref 13.0–17.0)
MCH: 29 pg (ref 26.0–34.0)
MCHC: 33.7 g/dL (ref 30.0–36.0)
MCV: 86 fL (ref 80.0–100.0)
Platelets: 254 10*3/uL (ref 150–400)
RBC: 3.21 MIL/uL — ABNORMAL LOW (ref 4.22–5.81)
RDW: 14.5 % (ref 11.5–15.5)
WBC: 7 10*3/uL (ref 4.0–10.5)
nRBC: 0 % (ref 0.0–0.2)

## 2023-08-01 LAB — URIC ACID, RANDOM URINE: Uric Acid, Urine: 48.9 mg/dL

## 2023-08-01 MED ORDER — HYDROMORPHONE 1 MG/ML IV SOLN
INTRAVENOUS | Status: DC
Start: 2023-08-01 — End: 2023-08-02
  Administered 2023-08-02: 0.6 mg via INTRAVENOUS

## 2023-08-01 MED ORDER — DIPHENHYDRAMINE HCL 12.5 MG/5ML PO ELIX
12.5000 mg | ORAL_SOLUTION | Freq: Four times a day (QID) | ORAL | Status: DC | PRN
Start: 2023-08-01 — End: 2023-08-02

## 2023-08-01 MED ORDER — SODIUM CHLORIDE 0.9% FLUSH: 9.0000 mL | INTRAVENOUS | Status: DC | PRN

## 2023-08-01 MED ORDER — DIPHENHYDRAMINE HCL 50 MG/ML IJ SOLN: 12.5000 mg | Freq: Four times a day (QID) | INTRAMUSCULAR | Status: DC | PRN

## 2023-08-01 MED ORDER — ONDANSETRON HCL 4 MG/2ML IJ SOLN
4.0000 mg | Freq: Four times a day (QID) | INTRAMUSCULAR | Status: DC | PRN
Start: 2023-08-01 — End: 2023-08-02

## 2023-08-01 MED ORDER — APIXABAN 5 MG PO TABS
10.0000 mg | ORAL_TABLET | Freq: Two times a day (BID) | ORAL | Status: DC
  Administered 2023-08-01 – 2023-08-03 (×4): 10 mg via ORAL
  Filled 2023-08-01 (×4): qty 2

## 2023-08-01 MED ORDER — NALOXONE HCL 0.4 MG/ML IJ SOLN: 0.4000 mg | INTRAMUSCULAR | Status: DC | PRN

## 2023-08-01 MED ORDER — SODIUM CHLORIDE 0.9% FLUSH: 10.0000 mL | INTRAVENOUS | Status: DC | PRN

## 2023-08-01 MED ORDER — APIXABAN 5 MG PO TABS: 5.0000 mg | ORAL_TABLET | Freq: Two times a day (BID) | ORAL | Status: DC

## 2023-08-01 MED ORDER — SODIUM CHLORIDE 0.9% FLUSH
10.0000 mL | Freq: Two times a day (BID) | INTRAVENOUS | Status: DC
  Administered 2023-08-01 – 2023-08-02 (×4): 10 mL

## 2023-08-01 NOTE — Plan of Care (Signed)

## 2023-08-01 NOTE — Progress Notes (Signed)
[facility] Progress Note Patient: Steve Stewart ZOX:[id number] DOB: 17-Sep-1990 DOA: 07/26/2023  DOS: the patient was seen and examined on 08/01/2023  Steve Stewart is a 32 y.o. 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  Pt currently on bleomycin for seminoma (cycle 1 completed 11/18 - 12/3). Pt had been on eliquis for DVT/PE but this was put on hold this past week due to severe thrombocytopenia with platelets in the 20s.   Repeat CT angio chest performed 07/20/2023 and showed resolution of previously seen right sided pulmonary emboli. He again underwent CT angio chest on 07/27/2023 on admission which showed bilateral pulmonary emboli right greater than left and peripheral groundglass opacities in the right lower lobe favored as pulmonary infarcts. Lower extremity duplex also revealed left lower extremity acute DVT involving multiple veins. Echo was performed on 07/27/2023 which showed normal RV function with moderately enlarged right ventricle.  Echo was again repeated on 07/28/2023 which showed normal RV function and normal RV size at that time. He has been followed by pulmonology as well. He has been on a heparin drip with plans for transitioning to Eliquis on 07/31/2023.   Assessment and Plan: Acute pulmonary embolism with acute cor pulmonale CT angio chest performed on 07/27/2023 showing bilateral PE right greater than left along with patchy peripheral groundglass opacities in the right lower lobe concerning for pulmonary infarcts Initial echo on 07/27/2023 showed moderate right ventricular enlargement, normal RV function.  Repeat echo on 07/28/2023 showed normalized right heart Remains on heparin drip with tentative plans for transitioning to Eliquis TBD after discussion with oncology Pulmonary was consulted. continue IS, BIPAP, and O2   Acute respiratory failure with hypoxia (HCC) due to underlying PE, with presumed  pulmonary infarct and GGO - continue O2, wean as able - not on home O2 at baseline  - IS, BIPAP   Primary seminoma of left testis (HCC) Pt on chemo with heme/onc including bleomycin - cycle 1 completed 11/18 - 12/3   HIV disease (HCC) - Recent diagnosis beginning of November 2024 - Continue Biktarvy -CD4 255 on 06/28/2023; HIV VL 117k   DVT (deep venous thrombosis) (HCC) - Duplex performed 07/27/2023: "Findings consistent with acute deep vein thrombosis involving the left femoral vein, left popliteal vein, left posterior tibial veins, and left peroneal veins. Findings consistent with acute intramuscular thrombosis involving the left gastrocnemius veins, and left soleal veins" - see PE workup   Hyponatremia - Drop noted today.  Asymptomatic - Trending for now   Pancytopenia (HCC) - Chemo effect from bleomycin -Cell counts are recovering    Subjective: Ongoing chest pain.  No nausea no vomiting no fever no chills.  Used 8.6 mg of Dilaudid in last 24 hours per RN.  Physical Exam: General: in Mild distress, No Rash Cardiovascular: S1 and S2 Present, No Murmur Respiratory: Good respiratory effort, Bilateral Air entry present.  Basal crackles, No wheezes Abdomen: Bowel Sound present, No tenderness Extremities: No edema Neuro: Alert and oriented x3, no new focal deficit  Data Reviewed: I have Reviewed nursing notes, Vitals, and Lab results. Since last encounter, pertinent lab results CBC and BMP   . I have ordered test including CBC and BMP  .  Discussed with oncology and pulmonary.  Disposition: Status is: Inpatient Currently on BiPAP.  Monitor for improvement in PCA requirement as well.  apixaban (ELIQUIS) tablet 10 mg  apixaban (ELIQUIS) tablet 5 mg   Family Communication: No  one at bedside Level of care: Progressive   Vitals:   08/01/23 1550 08/01/23 1600 08/01/23 1715 08/01/23 1918  BP:  129/65    Pulse:  87    Resp: (!) 26 (!) 23 (!) 27   Temp:  98 F (36.7 C)   98.2 F (36.8 C)  TempSrc:  Axillary  Oral  SpO2: 97% 97% 97%   Weight:      Height:         Author: Lynden Oxford, MD 08/01/2023 7:22 PM  Please look on [email or web address] to find out who is on call.

## 2023-08-01 NOTE — Progress Notes (Signed)
PHARMACY - ANTICOAGULATION CONSULT NOTE  Pharmacy Consult for IV heparin Indication: recurrent PE  No Known Allergies  Patient Measurements: Height: 5\' 11"  (180.3 cm) Weight: 116.2 kg (256 lb 3.2 oz) IBW/kg (Calculated) : 75.3 Heparin Dosing Weight: 100 kg  Vital Signs: Temp: 99.5 F (37.5 C) (12/11 0336) Temp Source: Oral (12/11 0336) BP: 106/58 (12/11 0400) Pulse Rate: 84 (12/11 0400)  Labs: Recent Labs    07/30/23 0402 07/30/23 1000 07/30/23 1716 07/31/23 0037 07/31/23 0458 07/31/23 0759 08/01/23 0313  HGB 9.4*  --   --   --  9.2*  --  9.3*  HCT 28.0*  --   --   --  28.0*  --  27.6*  PLT 240  --   --   --  273  --  254  APTT 74*   < > 50* 82*  --  90*  --   HEPARINUNFRC 0.97*  --   --   --  0.42  --  0.54  CREATININE 0.67  --   --   --  0.82  --  0.82  TROPONINIHS 2  --   --   --  3  --   --    < > = values in this interval not displayed.    Estimated Creatinine Clearance: 164.6 mL/min (by C-G formula based on SCr of 0.82 mg/dL).   Medical History: Past Medical History:  Diagnosis Date   HIV infection (HCC)    Medical history non-contributory    Testicular cancer (HCC)     Medications:  Medications Prior to Admission  Medication Sig Dispense Refill Last Dose   acetaminophen (TYLENOL) 650 MG CR tablet Take 1,300 mg by mouth every 8 (eight) hours as needed for pain.   07/26/2023   albuterol (VENTOLIN HFA) 108 (90 Base) MCG/ACT inhaler Inhale 1 puff into the lungs every 6 (six) hours as needed for wheezing or shortness of breath.   Past Month   bictegravir-emtricitabine-tenofovir AF (BIKTARVY) 50-200-25 MG TABS tablet Take 1 tablet by mouth daily. 30 tablet 2 07/26/2023   dexamethasone (DECADRON) 4 MG tablet Take 2 tablets (8 mg total) by mouth daily. Start the day after last cisplatin dose on day 6 of chemo x 3 days.Take with food. 30 tablet 1 unknown   famotidine (PEPCID) 20 MG tablet Take 1 tablet (20 mg total) by mouth 2 (two) times daily. 30 tablet 2  07/26/2023   lidocaine (XYLOCAINE) 2 % solution Use as directed 15 mLs in the mouth or throat every 3 (three) hours as needed for mouth pain. Mix 1 to 1 ratio with Maalox to rinse for pain 100 mL 0 unknown   lidocaine-prilocaine (EMLA) cream Apply to affected area once 30 g 3 Past Week   prochlorperazine (COMPAZINE) 10 MG tablet Take 1 tablet (10 mg total) by mouth every 6 (six) hours as needed for nausea or vomiting. 30 tablet 1 07/26/2023   traZODone (DESYREL) 50 MG tablet Take 1 tablet (50 mg total) by mouth at bedtime. 90 tablet 1 07/26/2023   apixaban (ELIQUIS) 5 MG TABS tablet Take 5 mg by mouth 2 (two) times daily. ON HOLD as of 07/24/23   07/17/2023   ondansetron (ZOFRAN) 8 MG tablet Take 1 tablet (8 mg total) by mouth every 8 (eight) hours as needed for nausea or vomiting. Start on the third day after last cisplatin dose on day 8 of chemo (Patient not taking: Reported on 07/27/2023) 30 tablet 1 Not Taking   Scheduled:  bictegravir-emtricitabine-tenofovir AF  1 tablet Oral Daily   Chlorhexidine Gluconate Cloth  6 each Topical Daily   famotidine  20 mg Oral BID   feeding supplement  237 mL Oral TID BM   HYDROmorphone   Intravenous Q4H   oxyCODONE  10 mg Oral Q12H   polyethylene glycol  17 g Oral Daily   senna-docusate  1 tablet Oral BID   sodium chloride flush  10-40 mL Intracatheter Q12H   traZODone  50 mg Oral QHS   PRN: acetaminophen **OR** acetaminophen, albuterol, ALPRAZolam, diphenhydrAMINE **OR** diphenhydrAMINE, magic mouthwash w/lidocaine, naloxone **AND** sodium chloride flush, ondansetron (ZOFRAN) IV, mouth rinse, sodium chloride flush  Assessment: 2 yoM with PMH testicular cancer on cytotoxic chemo (last received 11/18), HIV, recent PE/DVT diagnosed 11/1 which had resolved on CT scan from 11/29, now returns with new bilateral submassive PE with RH strain noted on CT (Eliquis had been on hold the past few weeks d/t chemo-induced thrombocytopenia, with Plt nadir 22k). Initially  started on Eliquis 10 mg BID, but today PCCM MD concerned with clinical deterioration/possible need for thrombolysis, and Pharmacy requested to transition patient to IV heparin infusion.  Prior anticoagulation: Eliquis 10 mg PO bid; LD 12/7 @ 0900  Today, 08/01/2023: Heparin level 0.54--therapeutic on heparin 2900 units/hr Hgb 9.3, plts 254--stable No further episodes of hemoptysis noted. No s/sx of bleeding or infusion-related issues reported.  Potentially switching back to DOAC today  Goal of Therapy: Heparin level 0.3-0.7 units/ml aPTT 66-102 sec Monitor platelets by anticoagulation protocol: Yes  Plan: Continue heparin drip at 2900 units/hr  Daily heparin level, CBC Monitor for signs of bleeding or thrombosis F/u DOAC plans    Cherylin Mylar, PharmD Clinical Pharmacist  12/11/20248:04 AM

## 2023-08-01 NOTE — Progress Notes (Signed)
   08/01/23 2339  BiPAP/CPAP/SIPAP  $ Non-Invasive Home Ventilator  Initial  $ Face Mask Large  Yes  BiPAP/CPAP/SIPAP Pt Type Adult  BiPAP/CPAP/SIPAP DREAMSTATIOND  Mask Type Full face mask  Mask Size Large  Respiratory Rate 25 breaths/min  IPAP 10 cmH20  EPAP 5 cmH2O  Flow Rate 3 lpm  Patient Home Equipment No  Auto Titrate No  CPAP/SIPAP surface wiped down Yes  BiPAP/CPAP /SiPAP Vitals  Pulse Rate 90  Resp (!) 25  BP (!) 100/59  SpO2 96 %  Bilateral Breath Sounds Diminished

## 2023-08-01 NOTE — Plan of Care (Signed)

## 2023-08-02 ENCOUNTER — Other Ambulatory Visit: Payer: Self-pay

## 2023-08-02 ENCOUNTER — Telehealth: Payer: Self-pay

## 2023-08-02 DIAGNOSIS — C6292 Malignant neoplasm of left testis, unspecified whether descended or undescended: Secondary | ICD-10-CM

## 2023-08-02 DIAGNOSIS — I2609 Other pulmonary embolism with acute cor pulmonale: Secondary | ICD-10-CM | POA: Diagnosis not present

## 2023-08-02 LAB — CBC
HCT: 30.7 % — ABNORMAL LOW (ref 39.0–52.0)
Hemoglobin: 9.9 g/dL — ABNORMAL LOW (ref 13.0–17.0)
MCH: 28.1 pg (ref 26.0–34.0)
MCHC: 32.2 g/dL (ref 30.0–36.0)
MCV: 87.2 fL (ref 80.0–100.0)
Platelets: 276 10*3/uL (ref 150–400)
RBC: 3.52 MIL/uL — ABNORMAL LOW (ref 4.22–5.81)
RDW: 14.6 % (ref 11.5–15.5)
WBC: 7.9 10*3/uL (ref 4.0–10.5)
nRBC: 0 % (ref 0.0–0.2)

## 2023-08-02 LAB — BASIC METABOLIC PANEL
Anion gap: 8 (ref 5–15)
BUN: 15 mg/dL (ref 6–20)
CO2: 27 mmol/L (ref 22–32)
Calcium: 8.5 mg/dL — ABNORMAL LOW (ref 8.9–10.3)
Chloride: 96 mmol/L — ABNORMAL LOW (ref 98–111)
Creatinine, Ser: 0.9 mg/dL (ref 0.61–1.24)
GFR, Estimated: >60 mL/min (ref >60)
Glucose, Bld: 95 mg/dL (ref 70–99)
Potassium: 4.7 mmol/L (ref 3.5–5.1)
Sodium: 131 mmol/L — ABNORMAL LOW (ref 135–145)

## 2023-08-02 MED ORDER — OXYCODONE HCL 5 MG PO TABS: 10.0000 mg | ORAL_TABLET | ORAL | Status: DC | PRN

## 2023-08-02 MED ORDER — OXYCODONE HCL ER 15 MG PO T12A
15.0000 mg | EXTENDED_RELEASE_TABLET | Freq: Two times a day (BID) | ORAL | Status: DC
  Administered 2023-08-02 – 2023-08-03 (×3): 15 mg via ORAL
  Filled 2023-08-02 (×3): qty 1

## 2023-08-02 MED ORDER — HYDROMORPHONE HCL 1 MG/ML IJ SOLN
0.5000 mg | INTRAMUSCULAR | Status: DC | PRN
  Administered 2023-08-02: 0.5 mg via INTRAVENOUS
  Filled 2023-08-02: qty 1

## 2023-08-02 MED ORDER — OXYCODONE HCL 5 MG PO TABS: 5.0000 mg | ORAL_TABLET | ORAL | Status: DC | PRN

## 2023-08-02 MED ORDER — ONDANSETRON HCL 4 MG/2ML IJ SOLN: 4.0000 mg | Freq: Four times a day (QID) | INTRAMUSCULAR | Status: DC | PRN

## 2023-08-02 NOTE — Telephone Encounter (Signed)
Pt calls to see if he can get a gas card to help w/ his mom's expenses going to/from the hospital to visit him this week (he is still hospitalized at [facility], says he is supposed to be d/c'd tomorrow). Contacted Nadiyah, SW about this, and she said that she is going to have [employer] contact pt regarding the gas card. TC to pt to inform that Orbie Hurst should have or will be contacting him. Also informed that scheduling is supposed to be reaching out to get him scheduled for OV w/ Dr. Cherly Hensen next week. He verbalizes understanding.

## 2023-08-02 NOTE — Progress Notes (Signed)
Steve Stewart   DOB:09-11-1990   WU#:[id number]    ASSESSMENT & PLAN:  32 y.o. male with diagnosis of left seminoma s/p orchiectomy presented with acute PE.    Diagnosis: pT2 [id number] normal TM. 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  MRI showed benign adrenal nodule.   Treatment: Status post cycle 1 BEP   Acute PE Improving of symptoms now on apixaban.  Still on oxygen. Continue apixaban 10 mg twice daily for total of 7 days, followed by 5 mg twice daily.  Discussed precautions.   Pain associated with pulmonary infarct and PE Pain has improved.  Weaned off pain medication as stable out the discharge Recommend schedule bowel regimen  Acute respiratory insufficiency Due to PE as above Postpone chemotherapy to January   Pancytopenia Resolved, secondary to recent chemotherapy as expected Mild anemia and microcytosis Will check ferritin   Stage IIA seminoma Completed cycle 1 of BEP will postpone cycle 2 to January HIV On Biktarvy per ID  Code Status Full code   Spoke to patient about the above plan.   Thank you for the consult. Will have scheduling call patient for follow-up in 3 to 4 weeks.  [name], MD 08/02/2023 5:39 PM  Subjective:  Steve Stewart reports overall feeling better today.  Pain has improved.  He was able to ambulate on the hallway today.  He was transferred to the 6 floor today.  No new abdominal pain, bloody stool.  Chest pain improved.  Shortness of breath improved.  Report of pain on taking deep breath.  Objective:  Vitals:   08/02/23 1300 08/02/23 1438  BP:  104/66  Pulse: 75 84  Resp: (!) 25 15  Temp:  (!) 97.2 F (36.2 C)  SpO2: 99% 95%     Intake/Output Summary (Last 24 hours) at 08/02/2023 1739 Last data filed at 08/02/2023 1432 Gross per 24 hour  Intake 250 ml  Output 3200 ml  Net -2950 ml    GENERAL: alert, no distress SKIN: skin color normal LUNGS: No wheeze, rales and clear to auscultation bilaterally  with normal breathing effort. On oxygen HEART: regular rate & rhythm  ABDOMEN: abdomen soft, non-tender and non-distended Musculoskeletal: no lower extremity edema    Labs:  Recent Labs    07/24/23 0759 07/26/23 2253 07/29/23 0616 07/30/23 0402 07/31/23 0458 08/01/23 0313 08/02/23 0425  NA 138   < > 128* 124* 132* 129* 131*  K 3.6   < > 4.4 3.6 3.9 4.1 4.7  CL 105   < > 92* 89* 94* 93* 96*  CO2 27   < > 26 26 29 28 27   GLUCOSE 111*   < > 107* 114* 110* 109* 95  BUN 13   < > 14 14 14 15 15   CREATININE 0.89   < > 0.99 0.67 0.82 0.82 0.90  CALCIUM 8.5*   < > 8.6* 7.8* 8.4* 8.3* 8.5*  GFRNONAA >60   < > >60 >60 >60 >60 >60  PROT 6.1*  --  7.3 6.8  --   --   --   ALBUMIN 3.6  --  3.0* 2.6*  --   --   --   AST 13*  --  13* 16  --   --   --   ALT 28  --  23 21  --   --   --   ALKPHOS 53  --  70 63  --   --   --  BILITOT 0.3  --  0.4 0.4  --   --   --    < > = values in this interval not displayed.    Studies:  CT Angio Chest Pulmonary Embolism (PE) W or WO Contrast Addendum Date: 07/29/2023 ADDENDUM REPORT: 07/29/2023 06:52 ADDENDUM: Bolus timing on the current study is markedly suboptimal for the pulmonary arteries. Within this limitation, the right lower lobe pulmonary embolus is not substantially changed since the 07/27/2023 exam. Pulmonary embolus seen previously in the left upper lobe is not evident on the current exam, likely due to bolus timing. Electronically Signed   By: Kennith Center M.D.   On: 07/29/2023 06:52   Result Date: 07/29/2023 CLINICAL DATA:  Pulmonary embolus on CT yesterday. EXAM: CT ANGIOGRAPHY CHEST WITH CONTRAST TECHNIQUE: Multidetector CT imaging of the chest was performed using the standard protocol during bolus administration of intravenous contrast. Multiplanar CT image reconstructions and MIPs were obtained to evaluate the vascular anatomy. RADIATION DOSE REDUCTION: This exam was performed according to the departmental dose-optimization program which  includes automated exposure control, adjustment of the mA and/or kV according to patient size and/or use of iterative reconstruction technique. CONTRAST:  75mL OMNIPAQUE IOHEXOL 350 MG/ML SOLN COMPARISON:  07/27/2023 FINDINGS: Cardiovascular: The heart size is normal. No substantial pericardial effusion. No thoracic aortic aneurysm. Right Port-A-Cath tip is positioned in the right atrium. Mediastinum/Nodes: 13 mm subcarinal lymph node is more prominent than on yesterday's study. No left hilar lymphadenopathy. No definite right hilar lymphadenopathy. The esophagus has normal imaging features. There is no axillary lymphadenopathy. Lungs/Pleura: Since yesterday's study, the patient has developed consolidative and ground-glass airspace disease in the right lower lobe with right lower lobe interlobular septal thickening. Given the right lower lobe pulmonary embolus on yesterday's exam, findings likely reflect right lower lobe pulmonary infarct. Subsegmental atelectasis is seen in the right upper lobe and left base although fine detail is obscured by breathing motion. No substantial pleural effusion. Upper Abdomen: Visualized portion of the upper abdomen shows no acute findings. Musculoskeletal: No worrisome lytic or sclerotic osseous abnormality. Review of the MIP images confirms the above findings. IMPRESSION: 1. Since yesterday's study, the patient has developed progressive consolidative and ground-glass airspace disease in the right lower lobe with right lower lobe interlobular septal thickening. Given the right lower lobe pulmonary embolus , findings likely reflect right lower lobe pulmonary infarct. 2. 13 mm subcarinal lymph node is more prominent than on yesterday's study. This is likely reactive. 3. Areas of subsegmental atelectasis in the lung bases bilaterally. Electronically Signed: By: Kennith Center M.D. On: 07/28/2023 13:43   DG CHEST PORT 1 VIEW Result Date: 07/28/2023 CLINICAL DATA:  Respiratory  distress. EXAM: PORTABLE CHEST 1 VIEW COMPARISON:  07/26/2023.  CT, 07/28/2023 at 12:52 p.m. FINDINGS: Cardiac silhouette normal in size. Stable right anterior chest wall Port-A-Cath. Low lung volumes. Hazy airspace opacity at the right lung base mostly obscures the right hemidiaphragm. Mild opacity noted at the medial left lung base. No pneumothorax. IMPRESSION: 1. Low lung volumes. 2. Right greater than left lung base opacity without convincing change from the CT performed earlier today. Electronically Signed   By: Amie Portland M.D.   On: 07/28/2023 17:25   ECHOCARDIOGRAM LIMITED Result Date: 07/28/2023    ECHOCARDIOGRAM LIMITED REPORT   Patient Name:   NENG ARNTZ Date of Exam: 07/28/2023 Medical Rec #:  [id number]            Height:       71.0  in Accession #:    [id number]           Weight:       256.0 lb Date of Birth:  08-03-1991            BSA:          2.342 m Patient Age:    32 years             BP:           132/76 mmHg Patient Gender: M                    HR:           110 bpm. Exam Location:  Inpatient Procedure: Limited Echo Indications:    RV Strain  History:        Patient has prior history of Echocardiogram examinations, most                 recent 07/27/2023.  Sonographer:    Darlys Gales Referring Phys: [id number] MURALI RAMASWAMY IMPRESSIONS  1. Limited echo with poor quality images.  2. Left ventricular ejection fraction, by estimation, is 55 to 60%. The left ventricle has normal function.  3. Right ventricular systolic function is normal. The right ventricular size is normal.  4. The aortic valve is tricuspid. FINDINGS  Left Ventricle: Left ventricular ejection fraction, by estimation, is 55 to 60%. The left ventricle has normal function. The left ventricular internal cavity size was normal in size. Right Ventricle: The right ventricular size is normal. Right vetricular wall thickness was not assessed. Right ventricular systolic function is normal. Pericardium: There is no evidence of  pericardial effusion. Tricuspid Valve: The tricuspid valve is not assessed. Aortic Valve: The aortic valve is tricuspid. IAS/Shunts: The interatrial septum was not well visualized. Additional Comments: Limited echo with poor quality images.  RIGHT VENTRICLE RV Basal diam:  3.20 cm RV Mid diam:    3.30 cm RV S prime:     19.70 cm/s TAPSE (M-mode): 2.3 cm Charlton Haws MD Electronically signed by Charlton Haws MD Signature Date/Time: 07/28/2023/3:10:07 PM    Final    VAS [place] LOWER EXTREMITY VENOUS (DVT) Result Date: 07/27/2023  Lower Venous DVT Study Patient Name:  GABRYLE WALDE  Date of Exam:   07/27/2023 Medical Rec #: [id number]             Accession #:    [id number] Date of Birth: 1990-12-11             Patient Gender: M Patient Age:   63 years Exam Location:  [facility] Procedure:      VAS [place] LOWER EXTREMITY VENOUS (DVT) Referring Phys: Lyda Perone --------------------------------------------------------------------------------  Indications: Pulmonary embolism.  Risk Factors: Confirmed PE DVT. Anticoagulation: Eliquis. Comparison Study: 06/21/2023 - RIGHT:                   - No evidence of common femoral vein obstruction.                    LEFT:                   - Findings consistent with acute deep vein thrombosis                   involving the left                   popliteal vein, left posterior tibial  veins, and left peroneal                   veins.                   - No cystic structure found in the popliteal fossa. Performing Technologist: Chanda Busing RVT  Examination Guidelines: A complete evaluation includes B-mode imaging, spectral Doppler, color Doppler, and power Doppler as needed of all accessible portions of each vessel. Bilateral testing is considered an integral part of a complete examination. Limited examinations for reoccurring indications may be performed as noted. The reflux portion of the exam is performed with the patient in reverse Trendelenburg.   +---------+---------------+---------+-----------+----------+--------------+ RIGHT    CompressibilityPhasicitySpontaneityPropertiesThrombus Aging +---------+---------------+---------+-----------+----------+--------------+ CFV      Full           Yes      Yes                                 +---------+---------------+---------+-----------+----------+--------------+ SFJ      Full                                                        +---------+---------------+---------+-----------+----------+--------------+ FV Prox  Full                                                        +---------+---------------+---------+-----------+----------+--------------+ FV Mid   Full                                                        +---------+---------------+---------+-----------+----------+--------------+ FV DistalFull                                                        +---------+---------------+---------+-----------+----------+--------------+ PFV      Full                                                        +---------+---------------+---------+-----------+----------+--------------+ POP      Full           Yes      Yes                                 +---------+---------------+---------+-----------+----------+--------------+ PTV      Full                                                        +---------+---------------+---------+-----------+----------+--------------+  PERO     Full                                                        +---------+---------------+---------+-----------+----------+--------------+   +---------+---------------+---------+-----------+----------+--------------+ LEFT     CompressibilityPhasicitySpontaneityPropertiesThrombus Aging +---------+---------------+---------+-----------+----------+--------------+ CFV      Full           Yes      Yes                                  +---------+---------------+---------+-----------+----------+--------------+ SFJ      Full                                                        +---------+---------------+---------+-----------+----------+--------------+ FV Prox  Full                                                        +---------+---------------+---------+-----------+----------+--------------+ FV Mid   Partial        Yes      Yes                  Acute          +---------+---------------+---------+-----------+----------+--------------+ FV DistalNone           No       No                   Acute          +---------+---------------+---------+-----------+----------+--------------+ PFV      Full                                                        +---------+---------------+---------+-----------+----------+--------------+ POP      None           No       No                   Acute          +---------+---------------+---------+-----------+----------+--------------+ PTV      None                                         Acute          +---------+---------------+---------+-----------+----------+--------------+ PERO     None                                         Acute          +---------+---------------+---------+-----------+----------+--------------+ Soleal   None  Acute          +---------+---------------+---------+-----------+----------+--------------+ Gastroc  None                                         Acute          +---------+---------------+---------+-----------+----------+--------------+ SSV      Full                                                        +---------+---------------+---------+-----------+----------+--------------+     Summary: RIGHT: - There is no evidence of deep vein thrombosis in the lower extremity.  - No cystic structure found in the popliteal fossa.  LEFT: - Findings consistent with acute deep vein  thrombosis involving the left femoral vein, left popliteal vein, left posterior tibial veins, and left peroneal veins. Findings consistent with acute intramuscular thrombosis involving the left gastrocnemius veins, and left soleal veins. - No cystic structure found in the popliteal fossa.  *See table(s) above for measurements and observations. Electronically signed by Carolynn Sayers on 07/27/2023 at 2:16:43 PM.    Final    ECHOCARDIOGRAM LIMITED Result Date: 07/27/2023    ECHOCARDIOGRAM LIMITED REPORT   Patient Name:   MAYCEN NIEMEIER Date of Exam: 07/27/2023 Medical Rec #:  [id number]            Height:       71.0 in Accession #:    [id number]           Weight:       256.0 lb Date of Birth:  [birth year]            BSA:          2.342 m Patient Age:    32 years             BP:           119/78 mmHg Patient Gender: M                    HR:           87 bpm. Exam Location:  Inpatient Procedure: Limited Echo, Cardiac Doppler and Color Doppler Indications:    I26.02 Pulmonary embolus  History:        Patient has prior history of Echocardiogram examinations, most                 recent 06/23/2023. Signs/Symptoms:Shortness of Breath and                 Dyspnea. Pulmonary embolus. Cancer. Cor Pulmonale.  Sonographer:    Sheralyn Boatman RDCS Referring Phys: [phone] JARED M GARDNER  Sonographer Comments: Technically difficult study due to poor echo windows. Patient in pain, could not turn. IMPRESSIONS  1. Left ventricular ejection fraction, by estimation, is 60 to 65%. The left ventricle has normal function. The left ventricle has no regional wall motion abnormalities.  2. Right ventricular systolic function is normal. The right ventricular size is moderately enlarged. Tricuspid regurgitation signal is inadequate for assessing PA pressure.  3. No evidence of mitral valve regurgitation.  4. The inferior vena cava is normal in size with greater than 50% respiratory variability, suggesting right atrial pressure of 3 mmHg.  Conclusion(s)/Recommendation(s): RV does  look more dilated compared to prior study; c/f RV strain. FINDINGS  Left Ventricle: Left ventricular ejection fraction, by estimation, is 60 to 65%. The left ventricle has normal function. The left ventricle has no regional wall motion abnormalities. The left ventricular internal cavity size was normal in size. Right Ventricle: The right ventricular size is moderately enlarged. Right ventricular systolic function is normal. Tricuspid regurgitation signal is inadequate for assessing PA pressure. Right Atrium: Right atrial size was normal in size. Tricuspid Valve: Tricuspid valve regurgitation is not demonstrated. Venous: The inferior vena cava is normal in size with greater than 50% respiratory variability, suggesting right atrial pressure of 3 mmHg. Additional Comments: Spectral Doppler performed. Color Doppler performed.  LEFT VENTRICLE PLAX 2D LVIDd:         4.30 cm LVIDs:         2.90 cm LV PW:         1.00 cm LV IVS:        1.10 cm LVOT diam:     2.40 cm LV SV:         66 LV SV Index:   28 LVOT Area:     4.52 cm  LV Volumes (MOD) LV vol d, MOD A2C: 68.2 ml LV vol d, MOD A4C: 69.0 ml LV vol s, MOD A2C: 21.6 ml LV vol s, MOD A4C: 21.8 ml LV SV MOD A2C:     46.6 ml LV SV MOD A4C:     69.0 ml LV SV MOD BP:      47.7 ml RIGHT VENTRICLE             IVC RV S prime:     10.90 cm/s  IVC diam: 1.90 cm TAPSE (M-mode): 2.0 cm RIGHT ATRIUM           Index RA Area:     14.90 cm RA Volume:   40.20 ml  17.17 ml/m  AORTIC VALVE LVOT Vmax:   102.00 cm/s LVOT Vmean:  64.600 cm/s LVOT VTI:    0.145 m  AORTA Ao Asc diam: 3.30 cm MITRAL VALVE MV Area (PHT): 4.60 cm    SHUNTS MV Decel Time: 165 msec    Systemic VTI:  0.14 m MV E velocity: 82.40 cm/s  Systemic Diam: 2.40 cm MV A velocity: 78.40 cm/s MV E/A ratio:  1.05 [place] signed by Carolan Clines Signature Date/Time: 07/27/2023/12:17:48 PM    Final    CT Angio Chest PE W and/or Wo Contrast Result Date:  07/27/2023 CLINICAL DATA:  High probability for PE. EXAM: CT ANGIOGRAPHY CHEST WITH CONTRAST TECHNIQUE: Multidetector CT imaging of the chest was performed using the standard protocol during bolus administration of intravenous contrast. Multiplanar CT image reconstructions and MIPs were obtained to evaluate the vascular anatomy. RADIATION DOSE REDUCTION: This exam was performed according to the departmental dose-optimization program which includes automated exposure control, adjustment of the mA and/or kV according to patient size and/or use of iterative reconstruction technique. CONTRAST:  80mL OMNIPAQUE IOHEXOL 350 MG/ML SOLN COMPARISON:  CT angiogram chest 07/19/2028. FINDINGS: Cardiovascular: There are pulmonary emboli within lobar, segmental and subsegmental branches of the right lower lobe there also pulmonary emboli within subsegmental branches of the left upper lobe. Aorta is normal in size. Heart is normal in size. There is no pericardial effusion. Right chest port catheter tip ends in the distal SVC. Mediastinum/Nodes: No enlarged mediastinal, hilar, or axillary lymph nodes. Thyroid gland, trachea, and esophagus demonstrate no significant findings. Lungs/Pleura: Peripheral patchy ground-glass  opacities in the right lower lobe are favored as pulmonary infarcts. There is minimal atelectasis in the right middle lobe. No pleural effusion or pneumothorax. Upper Abdomen: No acute abnormality. Musculoskeletal: No chest wall abnormality. No acute or significant osseous findings. Review of the MIP images confirms the above findings. IMPRESSION: 1. Bilateral pulmonary emboli, right greater than left. Positive for acute PE with CTevidence of right heart strain (RV/LV Ratio = 1.3) consistent with at least submassive (intermediate risk) PE. The presence of right heart strain has been associated with an increased risk of morbidity and mortality. 2. Peripheral patchy ground-glass opacities in the right lower lobe are  favored as pulmonary infarcts. These results were called by telephone at the time of interpretation on 07/27/2023 at 12:39 am to provider [facility] , who verbally acknowledged these results. Electronically Signed   By: Darliss Cheney M.D.   On: 07/27/2023 00:41   DG Chest 2 View Result Date: 07/26/2023 CLINICAL DATA:  Shortness of breath. History of DVT and pulmonary embolus. Off of Eliquis for couple of weeks. EXAM: CHEST - 2 VIEW COMPARISON:  07/20/2023 FINDINGS: Power port type central venous catheter with tip over the cavoatrial junction region. No pneumothorax. Shallow inspiration. Linear atelectasis or infiltration seen in the right lung base. No pleural effusions. No pneumothorax. Mediastinal contours appear intact. Heart size and pulmonary vascularity are normal. IMPRESSION: Shallow inspiration with linear atelectasis or infiltration in the right lung base. Electronically Signed   By: Burman Nieves M.D.   On: 07/26/2023 23:24   CT Angio Chest PE W and/or Wo Contrast Result Date: 07/20/2023 CLINICAL DATA:  Short of breath and chest pain beginning late last night. Patient stopped taking Eliquis on Tuesday. EXAM: CT ANGIOGRAPHY CHEST WITH CONTRAST TECHNIQUE: Multidetector CT imaging of the chest was performed using the standard protocol during bolus administration of intravenous contrast. Multiplanar CT image reconstructions and MIPs were obtained to evaluate the vascular anatomy. RADIATION DOSE REDUCTION: This exam was performed according to the departmental dose-optimization program which includes automated exposure control, adjustment of the mA and/or kV according to patient size and/or use of iterative reconstruction technique. CONTRAST:  75mL OMNIPAQUE IOHEXOL 350 MG/ML SOLN COMPARISON:  06/22/2023. FINDINGS: Cardiovascular: Pulmonary arteries are well opacified. No evidence of a pulmonary embolism. Previously noted right-sided pulmonary emboli have resolved. Heart is normal in size and  configuration. No pericardial effusion. Great vessels are normal in caliber. No aortic dissection or atherosclerosis. Arch branch vessels are widely patent. Mediastinum/Nodes: No neck base, mediastinal or hilar masses. No enlarged lymph nodes. Trachea and esophagus are unremarkable. Lungs/Pleura: Mild ground-glass opacities in the lower lobes suspected to be atelectasis. More confluent type opacity noted in the lower lobes on the prior study resolved. Remainder of the lungs is clear. No pleural effusion or pneumothorax. Upper Abdomen: No acute findings. 1.9 cm gallstone, dependent aspect of a distended gallbladder. No gallbladder wall thickening or adjacent inflammation. Musculoskeletal: No chest wall abnormality. No acute or significant osseous findings. Review of the MIP images confirms the above findings. IMPRESSION: 1. No evidence of a pulmonary embolism. Previously noted right-sided pulmonary emboli have resolved. 2. No acute findings. 3. Cholelithiasis. 4. Mild ground-glass opacities in the lower lobes suspected to be atelectasis. Electronically Signed   By: Amie Portland M.D.   On: 07/20/2023 09:15   DG Chest 2 View Result Date: 07/20/2023 CLINICAL DATA:  Chest pain EXAM: CHEST - 2 VIEW COMPARISON:  06/22/2023 FINDINGS: Porta catheter on the right with tip at the upper cavoatrial  junction. Normal heart size and mediastinal contours. No acute infiltrate or edema. No effusion or pneumothorax. No acute osseous findings. IMPRESSION: No active cardiopulmonary disease. Electronically Signed   By: Tiburcio Pea M.D.   On: 07/20/2023 06:59

## 2023-08-02 NOTE — TOC Progression Note (Signed)
Transition of Care [facility]) - Progression Note    Patient Details  Name: Steve Stewart MRN: [id number] Date of Birth: [birth year]  Transition of Care [facility]) CM/SW Contact  Adrian Prows, RN Phone Number: 08/02/2023, 12:12 PM  Clinical Narrative:    Orders received for home oxygen; spoke w/ pt in room; pt agrees to receive recc service; he confirmed d/c address: [address] [place], [place] [address]; pt says he does not have an agency preference; spoke w/ Eusebio Me from [place]; he says agency can provide service, and travel tank will be delivered to pt's room; pt notified; agency contact info placed in follow up provider section of d/c instructions.        Expected Discharge Plan and Services                                               Social Determinants of Health (SDOH) Interventions SDOH Screenings   Food Insecurity: No Food Insecurity (07/28/2023)  Housing: Low Risk  (07/28/2023)  Transportation Needs: No Transportation Needs (07/28/2023)  Utilities: Not At Risk (07/28/2023)  Depression (PHQ2-9): Low Risk  (06/28/2023)  Social Connections: Unknown (01/02/2022)   Received from [facility]  Tobacco Use: Medium Risk (07/27/2023)    Readmission Risk Interventions    07/27/2023   12:36 PM  Readmission Risk Prevention Plan  Transportation Screening Complete  PCP or Specialist Appt within 3-5 Days Complete  HRI or Home Care Consult Complete  Social Work Consult for Recovery Care Planning/Counseling Complete  Palliative Care Screening Not Applicable  Medication Review [employer]) Complete

## 2023-08-02 NOTE — Progress Notes (Signed)
[facility] CSW Progress Note  Clinical Child psychotherapist was contacted by patient's nurse regarding transportation assistance. CSW was informed that patient is in need of transportation assistance due to being discharged from hospital tomorrow. CSW attempted to reach patient to discuss need, unable to leave vm. CSW sent message to Patient Financial Specialist Everlean Alstrom for guidance on next steps. CSW informed patient's nurse that unfortunately, patient cannot receive one today due to not getting approval from Everlean Alstrom. CSW informed patient's nurse that patient can be directed to [facility] for ongoing questions.   Marguerita Merles, LCSW Clinical Social Worker [facility]

## 2023-08-02 NOTE — Progress Notes (Signed)
[facility] Progress Note Patient: Steve Stewart LKG:[id number] DOB: [birth year] DOA: 07/26/2023  DOS: the patient was seen and examined on 08/02/2023  Brief hospital course: PMH seminoma s/p orchiectomy, acute PE diagnosed Nov 1st 2024 with LLE DVT, HIV on HAART.  Pt currently on bleomycin for seminoma (cycle 1 completed 11/18 - 12/3). Pt had been on eliquis for DVT/PE but this was put on hold this past week due to severe thrombocytopenia with platelets in the 20s.   Repeat CT angio chest performed 07/20/2023 and showed resolution of previously seen right sided pulmonary emboli. Presented with chest pain, again underwent CT angio chest on 07/27/2023 on admission which showed bilateral pulmonary emboli right greater than left and peripheral groundglass opacities in the right lower lobe favored as pulmonary infarcts. Lower extremity duplex also revealed left lower extremity acute DVT involving multiple veins. Echo was performed on 07/27/2023 which showed normal RV function with moderately enlarged right ventricle.  Echo was again repeated on 07/28/2023 which showed normal RV function and normal RV size at that time. Pulmonary and oncology were following the patient. Was on heparin drip. Now on Eliquis.   Assessment and Plan: Acute pulmonary embolism with acute cor pulmonale Acute left leg DVT CT angio chest performed on 07/27/2023 showing bilateral PE right greater than left along with patchy peripheral groundglass opacities in the right lower lobe concerning for pulmonary infarcts Initial echo on 07/27/2023 showed moderate right ventricular enlargement, normal RV function.  Repeat echo on 07/28/2023 showed normalized right heart Pulmonary was consulted.  Currently on Eliquis.  Oxygenation improving.  Was on BiPAP.  Now only on 2 LPM.  Pain controlled. PCCM started the patient on Dilaudid PCA. At present pain is improving. Will switch from PCA to as needed boluses as well as oral  axes. Monitor. Patient was started on oral OxyContin earlier which I will continue for now.   Acute respiratory failure with hypoxia due to underlying PE, with presumed pulmonary infarct and GGO Will continue oxygen therapy as the patient remains severely hypoxic.  Concern for OSA. Outpatient sleep study recommended.   Primary seminoma of left testis (HCC) Pt on chemo with heme/onc including bleomycin - cycle 1 completed 11/18 - 12/3 Outpatient management per oncology.   HIV disease [facility]) Recent diagnosis beginning of November 2024 Continue Biktarvy CD4 255 on 06/28/2023; HIV VL 117k   Hyponatremia Likely from poor p.o. intake.  Improving.  Monitor.  asymptomatic.   Pancytopenia Chemo effect from bleomycin Cell counts are recovering   Subjective: Chest pain still present.  No nausea no vomiting no fever no chills.  No diarrhea.  No bleeding.  Physical Exam: General: in Mild distress, No Rash Cardiovascular: S1 and S2 Present, No Murmur Respiratory: Good respiratory effort, Bilateral Air entry present.  Basal crackles, No wheezes Abdomen: Bowel Sound present, No tenderness Extremities: No edema Neuro: Alert and oriented x3, no new focal deficit  Data Reviewed: I have Reviewed nursing notes, Vitals, and Lab results. Since last encounter, pertinent lab results CBC and BMP   . I have ordered test including CBC and BMP  .   Disposition: Status is: Inpatient Remains inpatient appropriate because: Monitor for pain, improvement off of PCA  apixaban (ELIQUIS) tablet 10 mg  apixaban (ELIQUIS) tablet 5 mg   Family Communication: No one at bedside Level of care: Telemetry continue to monitor telemetry Vitals:   08/02/23 1200 08/02/23 1300 08/02/23 1438 08/02/23 1454  BP:   104/66   Pulse: 81 75 84  Resp: (!) 27 (!) 25 15   Temp:   (!) 97.2 F (36.2 C)   TempSrc:   Oral   SpO2: 97% 99% 95%   Weight:    113.8 kg  Height:    5\' 11"  (1.803 m)     Author: Lynden Oxford,  MD 08/02/2023 5:37 PM  Please look on [email or web address] to find out who is on call.

## 2023-08-02 NOTE — Progress Notes (Signed)
SATURATION QUALIFICATIONS: (This note is used to comply with regulatory documentation for home oxygen)  Patient Saturations on Room Air at Rest = 93%  Patient Saturations on Room Air while Ambulating = 85%  Patient Saturations on 2 Liters of oxygen while Ambulating = 95%  Please briefly explain why patient needs home oxygen: Patient desats while ambulating on room air.

## 2023-08-02 NOTE — Plan of Care (Signed)

## 2023-08-03 ENCOUNTER — Other Ambulatory Visit (HOSPITAL_COMMUNITY): Payer: Self-pay

## 2023-08-03 DIAGNOSIS — I2609 Other pulmonary embolism with acute cor pulmonale: Secondary | ICD-10-CM | POA: Diagnosis not present

## 2023-08-03 LAB — CBC
HCT: 32.4 % — ABNORMAL LOW (ref 39.0–52.0)
Hemoglobin: 10.4 g/dL — ABNORMAL LOW (ref 13.0–17.0)
MCH: 27.9 pg (ref 26.0–34.0)
MCHC: 32.1 g/dL (ref 30.0–36.0)
MCV: 86.9 fL (ref 80.0–100.0)
Platelets: 255 10*3/uL (ref 150–400)
RBC: 3.73 MIL/uL — ABNORMAL LOW (ref 4.22–5.81)
RDW: 14.7 % (ref 11.5–15.5)
WBC: 9.2 10*3/uL (ref 4.0–10.5)
nRBC: 0 % (ref 0.0–0.2)

## 2023-08-03 LAB — BASIC METABOLIC PANEL
Anion gap: 8 (ref 5–15)
BUN: 18 mg/dL (ref 6–20)
CO2: 27 mmol/L (ref 22–32)
Calcium: 8.6 mg/dL — ABNORMAL LOW (ref 8.9–10.3)
Chloride: 96 mmol/L — ABNORMAL LOW (ref 98–111)
Creatinine, Ser: 0.88 mg/dL (ref 0.61–1.24)
GFR, Estimated: >60 mL/min (ref >60)
Glucose, Bld: 95 mg/dL (ref 70–99)
Potassium: 4.3 mmol/L (ref 3.5–5.1)
Sodium: 131 mmol/L — ABNORMAL LOW (ref 135–145)

## 2023-08-03 MED ORDER — MELATONIN 10 MG PO TABS
1.0000 | ORAL_TABLET | Freq: Every day | ORAL | 0 refills | Status: AC
  Filled 2023-08-03 – 2023-09-05 (×3): qty 30, 30d supply, fill #0

## 2023-08-03 MED ORDER — APIXABAN (ELIQUIS) VTE STARTER PACK (10MG AND 5MG)
ORAL_TABLET | ORAL | 0 refills | Status: DC
  Filled 2023-08-03: qty 74, 30d supply, fill #0

## 2023-08-03 MED ORDER — POLYETHYLENE GLYCOL 3350 17 G PO PACK
17.0000 g | PACK | Freq: Every day | ORAL | 0 refills | Status: AC
  Filled 2023-08-03: qty 14, 14d supply, fill #0

## 2023-08-03 MED ORDER — OXYCODONE-ACETAMINOPHEN 10-325 MG PO TABS
1.0000 | ORAL_TABLET | ORAL | 0 refills | Status: AC | PRN
  Filled 2023-08-03: qty 30, 5d supply, fill #0

## 2023-08-03 MED ORDER — DOCUSATE SODIUM 100 MG PO CAPS
200.0000 mg | ORAL_CAPSULE | Freq: Two times a day (BID) | ORAL | 0 refills | Status: AC
  Filled 2023-08-03: qty 20, 5d supply, fill #0

## 2023-08-03 MED ORDER — ALPRAZOLAM 0.25 MG PO TABS
0.2500 mg | ORAL_TABLET | Freq: Every evening | ORAL | 0 refills | Status: AC | PRN
  Filled 2023-08-03: qty 5, 5d supply, fill #0

## 2023-08-03 NOTE — Progress Notes (Signed)
AVS reviewed w/ pt who verbalized an understanding, no other questions at this time. Pt waiting on his mom to arrive.

## 2023-08-03 NOTE — Progress Notes (Signed)
Travel tank in room, discharge meds delivered to room in a secure bag by this RN. Pt is calling his mom to pick him up

## 2023-08-03 NOTE — Progress Notes (Signed)
PT IV REMOVED   port de accessed by Phoebe Sharps Rn   pt calm resting in bed  pt educated on  d/c info  no distress noted at this time    pt medicated per order    AVS reviewed with pt   pt verbalized understanding   belonging bagged up  awaiting mother arrival for transport

## 2023-08-04 ENCOUNTER — Other Ambulatory Visit: Payer: Self-pay

## 2023-08-06 NOTE — Discharge Summary (Signed)
Physician Discharge Summary   Patient: Steve Stewart MRN: [id number] DOB: Jul 08, 1991  Admit date:     07/26/2023  Discharge date: 08/03/2023  Discharge Physician: Lynden Oxford  PCP: Patient, No Pcp Per  Recommendations at discharge: Follow-up with PCP. Needs sleep study outpatient.   Follow-up Information     [facility] Follow up.   Contact information: [address] [place] [address] [phone]         [name], MD. Schedule an appointment as soon as possible for a visit in 1 week(s).   Specialty: Oncology Contact information: [address] [place] [address] [phone]                Discharge Diagnoses: Principal Problem:   Acute pulmonary embolism with acute cor pulmonale (HCC) Active Problems:   Primary seminoma of left testis (HCC)   Acute respiratory failure with hypoxia (HCC)   HIV disease (HCC)   DVT (deep venous thrombosis) (HCC)   Hyponatremia   Pancytopenia (HCC)   Antineoplastic chemotherapy induced pancytopenia (HCC)   Embolism, pulmonary with infarction (HCC)   Normocytic anemia  Brief hospital course: PMH seminoma s/p orchiectomy, acute PE diagnosed Nov 1st 2024 with LLE DVT, HIV on HAART.  Pt currently on bleomycin for seminoma (cycle 1 completed 11/18 - 12/3). Pt had been on eliquis for DVT/PE but this was put on hold this past week due to severe thrombocytopenia with platelets in the 20s.   Repeat CT angio chest performed 07/20/2023 and showed resolution of previously seen right sided pulmonary emboli. Presented with chest pain, again underwent CT angio chest on 07/27/2023 on admission which showed bilateral pulmonary emboli right greater than left and peripheral groundglass opacities in the right lower lobe favored as pulmonary infarcts. Lower extremity duplex also revealed left lower extremity acute DVT involving multiple veins. Echo was performed on 07/27/2023 which showed normal RV function with  moderately enlarged right ventricle.  Echo was again repeated on 07/28/2023 which showed normal RV function and normal RV size at that time. Pulmonary and oncology were following the patient. Was on heparin drip. Now on Eliquis.   Assessment and Plan: Acute pulmonary embolism with acute cor pulmonale Acute left leg DVT CT angio chest performed on 07/27/2023 showing bilateral PE right greater than left along with patchy peripheral groundglass opacities in the right lower lobe concerning for pulmonary infarcts Initial echo on 07/27/2023 showed moderate right ventricular enlargement, normal RV function.  Repeat echo on 07/28/2023 showed normalized right heart Pulmonary was consulted.  Currently on Eliquis.  Oxygenation improving.  Was on BiPAP.  Now only on 2 LPM.  Pain control PCCM started the patient on Dilaudid PCA. At present pain is improving. Patient was successfully able to be weaned off from PCA. Based on his last 24-hour use on oral pain medications will continue oral Percocet on discharge as as needed only.   Acute respiratory failure with hypoxia due to underlying PE, with presumed pulmonary infarct and GGO Will continue oxygen therapy as the patient remains hypoxic.  Concern for OSA. Outpatient sleep study recommended.   Primary seminoma of left testis (HCC) Pt on chemo with heme/onc including bleomycin - cycle 1 completed 11/18 - 12/3 Outpatient management per oncology.   HIV disease [facility]) Recent diagnosis beginning of November 2024 Continue Biktarvy CD4 255 on 06/28/2023; HIV VL 117k   Hyponatremia Likely from poor p.o. intake.  Improving.  Monitor.  asymptomatic.   Pancytopenia Chemo effect from  bleomycin Cell counts are recovering  Pain control - [place] Controlled Substance Reporting System database was reviewed. and patient was instructed, not to drive, operate heavy machinery, perform activities at heights, swimming or participation in water activities or  provide baby-sitting services while on Pain, Sleep and Anxiety Medications; until their outpatient Physician has advised to do so again. Also recommended to not to take more than prescribed Pain, Sleep and Anxiety Medications.  Consultants:  Oncology Pulmonary  Procedures performed:  BiPAP  DISCHARGE MEDICATION: Allergies as of 08/03/2023   No Known Allergies      Medication List     STOP taking these medications    Eliquis 5 MG Tabs tablet Generic drug: apixaban Replaced by: Eliquis DVT/PE Starter Pack   ondansetron 8 MG tablet Commonly known as: Zofran       TAKE these medications    acetaminophen 650 MG CR tablet Commonly known as: TYLENOL Take 1,300 mg by mouth every 8 (eight) hours as needed for pain.   albuterol 108 (90 Base) MCG/ACT inhaler Commonly known as: VENTOLIN HFA Inhale 1 puff into the lungs every 6 (six) hours as needed for wheezing or shortness of breath.   ALPRAZolam 0.25 MG tablet Commonly known as: XANAX Take 1 tablet (0.25 mg total) by mouth at bedtime as needed for anxiety or sleep.   Biktarvy 50-200-25 MG Tabs tablet Generic drug: bictegravir-emtricitabine-tenofovir AF Take 1 tablet by mouth daily.   dexamethasone 4 MG tablet Commonly known as: DECADRON Take 2 tablets (8 mg total) by mouth daily. Start the day after last cisplatin dose on day 6 of chemo x 3 days.Take with food.   docusate sodium 100 MG capsule Commonly known as: Colace Take 2 capsules (200 mg total) by mouth 2 (two) times daily.   Eliquis DVT/PE Starter Pack Generic drug: Apixaban Starter Pack (10mg  and 5mg ) Take as directed on package: start with two-5mg  tablets twice daily for 7 days. On day 8, switch to one-5mg  tablet twice daily. Replaces: Eliquis 5 MG Tabs tablet   famotidine 20 MG tablet Commonly known as: PEPCID Take 1 tablet (20 mg total) by mouth 2 (two) times daily.   lidocaine 2 % solution Commonly known as: XYLOCAINE Use as directed 15 mLs in the  mouth or throat every 3 (three) hours as needed for mouth pain. Mix 1 to 1 ratio with Maalox to rinse for pain   lidocaine-prilocaine cream Commonly known as: EMLA Apply to affected area once   Melatonin 10 MG Tabs Take 1 tablet by mouth at bedtime.   oxyCODONE-acetaminophen 10-325 MG tablet Commonly known as: Percocet Take 1 tablet by mouth every 4 (four) hours as needed for pain.   PEG 3350 17 g Pack Take 17 g by mouth daily.   prochlorperazine 10 MG tablet Commonly known as: COMPAZINE Take 1 tablet (10 mg total) by mouth every 6 (six) hours as needed for nausea or vomiting.   traZODone 50 MG tablet Commonly known as: DESYREL Take 1 tablet (50 mg total) by mouth at bedtime.       Disposition: Home Diet recommendation: Cardiac diet  Discharge Exam: Vitals:   08/02/23 1845 08/02/23 2239 08/03/23 0248 08/03/23 0536  BP: 108/73 110/72 101/65 100/61  Pulse: 88 91 84 77  Resp: 18 16 15 17   Temp: (!) 97.4 F (36.3 C) 98.5 F (36.9 C) 99 F (37.2 C) 98.9 F (37.2 C)  TempSrc: Oral Oral Oral Oral  SpO2: 95% 92% 92% 91%  Weight:  Height:       General: Appear in mild distress; no visible Abnormal Neck Mass Or lumps, Conjunctiva normal Cardiovascular: S1 and S2 Present, no Murmur, Respiratory: good respiratory effort, Bilateral Air entry present and faint basal crackles, no wheezes Abdomen: Bowel Sound present, Non tender  Extremities: no Pedal edema Neurology: alert and oriented to time, place, and person  Filed Weights   07/28/23 1620 07/31/23 1138 08/02/23 1454  Weight: 104.4 kg 116.2 kg 113.8 kg   Condition at discharge: stable  The results of significant diagnostics from this hospitalization (including imaging, microbiology, ancillary and laboratory) are listed below for reference.   Imaging Studies: CT Angio Chest Pulmonary Embolism (PE) W or WO Contrast Addendum Date: 07/29/2023 ADDENDUM REPORT: 07/29/2023 06:52 ADDENDUM: Bolus timing on the current  study is markedly suboptimal for the pulmonary arteries. Within this limitation, the right lower lobe pulmonary embolus is not substantially changed since the 07/27/2023 exam. Pulmonary embolus seen previously in the left upper lobe is not evident on the current exam, likely due to bolus timing. Electronically Signed   By: Kennith Center M.D.   On: 07/29/2023 06:52   Result Date: 07/29/2023 CLINICAL DATA:  Pulmonary embolus on CT yesterday. EXAM: CT ANGIOGRAPHY CHEST WITH CONTRAST TECHNIQUE: Multidetector CT imaging of the chest was performed using the standard protocol during bolus administration of intravenous contrast. Multiplanar CT image reconstructions and MIPs were obtained to evaluate the vascular anatomy. RADIATION DOSE REDUCTION: This exam was performed according to the departmental dose-optimization program which includes automated exposure control, adjustment of the mA and/or kV according to patient size and/or use of iterative reconstruction technique. CONTRAST:  75mL OMNIPAQUE IOHEXOL 350 MG/ML SOLN COMPARISON:  07/27/2023 FINDINGS: Cardiovascular: The heart size is normal. No substantial pericardial effusion. No thoracic aortic aneurysm. Right Port-A-Cath tip is positioned in the right atrium. Mediastinum/Nodes: 13 mm subcarinal lymph node is more prominent than on yesterday's study. No left hilar lymphadenopathy. No definite right hilar lymphadenopathy. The esophagus has normal imaging features. There is no axillary lymphadenopathy. Lungs/Pleura: Since yesterday's study, the patient has developed consolidative and ground-glass airspace disease in the right lower lobe with right lower lobe interlobular septal thickening. Given the right lower lobe pulmonary embolus on yesterday's exam, findings likely reflect right lower lobe pulmonary infarct. Subsegmental atelectasis is seen in the right upper lobe and left base although fine detail is obscured by breathing motion. No substantial pleural effusion.  Upper Abdomen: Visualized portion of the upper abdomen shows no acute findings. Musculoskeletal: No worrisome lytic or sclerotic osseous abnormality. Review of the MIP images confirms the above findings. IMPRESSION: 1. Since yesterday's study, the patient has developed progressive consolidative and ground-glass airspace disease in the right lower lobe with right lower lobe interlobular septal thickening. Given the right lower lobe pulmonary embolus , findings likely reflect right lower lobe pulmonary infarct. 2. 13 mm subcarinal lymph node is more prominent than on yesterday's study. This is likely reactive. 3. Areas of subsegmental atelectasis in the lung bases bilaterally. Electronically Signed: By: Kennith Center M.D. On: 07/28/2023 13:43   DG CHEST PORT 1 VIEW Result Date: 07/28/2023 CLINICAL DATA:  Respiratory distress. EXAM: PORTABLE CHEST 1 VIEW COMPARISON:  07/26/2023.  CT, 07/28/2023 at 12:52 p.m. FINDINGS: Cardiac silhouette normal in size. Stable right anterior chest wall Port-A-Cath. Low lung volumes. Hazy airspace opacity at the right lung base mostly obscures the right hemidiaphragm. Mild opacity noted at the medial left lung base. No pneumothorax. IMPRESSION: 1. Low lung volumes. 2. Right  greater than left lung base opacity without convincing change from the CT performed earlier today. Electronically Signed   By: Amie Portland M.D.   On: 07/28/2023 17:25   ECHOCARDIOGRAM LIMITED Result Date: 07/28/2023    ECHOCARDIOGRAM LIMITED REPORT   Patient Name:   NOBEL SHADE Date of Exam: 07/28/2023 Medical Rec #:  [id number]            Height:       71.0 in Accession #:    [id number]           Weight:       256.0 lb Date of Birth:  [birth year]            BSA:          2.342 m Patient Age:    32 years             BP:           132/76 mmHg Patient Gender: M                    HR:           110 bpm. Exam Location:  Inpatient Procedure: Limited Echo Indications:    RV Strain  History:        Patient has  prior history of Echocardiogram examinations, most                 recent 07/27/2023.  Sonographer:    Darlys Gales Referring Phys: 4 MURALI RAMASWAMY IMPRESSIONS  1. Limited echo with poor quality images.  2. Left ventricular ejection fraction, by estimation, is 55 to 60%. The left ventricle has normal function.  3. Right ventricular systolic function is normal. The right ventricular size is normal.  4. The aortic valve is tricuspid. FINDINGS  Left Ventricle: Left ventricular ejection fraction, by estimation, is 55 to 60%. The left ventricle has normal function. The left ventricular internal cavity size was normal in size. Right Ventricle: The right ventricular size is normal. Right vetricular wall thickness was not assessed. Right ventricular systolic function is normal. Pericardium: There is no evidence of pericardial effusion. Tricuspid Valve: The tricuspid valve is not assessed. Aortic Valve: The aortic valve is tricuspid. IAS/Shunts: The interatrial septum was not well visualized. Additional Comments: Limited echo with poor quality images.  RIGHT VENTRICLE RV Basal diam:  3.20 cm RV Mid diam:    3.30 cm RV S prime:     19.70 cm/s TAPSE (M-mode): 2.3 cm Charlton Haws MD Electronically signed by Charlton Haws MD Signature Date/Time: 07/28/2023/3:10:07 PM    Final    VAS [place] LOWER EXTREMITY VENOUS (DVT) Result Date: 07/27/2023  Lower Venous DVT Study Patient Name:  MYKAH BARDIN  Date of Exam:   07/27/2023 Medical Rec #: [id number]             Accession #:    [id number] Date of Birth: 1990/11/30             Patient Gender: M Patient Age:   81 years Exam Location:  [facility] Procedure:      VAS [place] LOWER EXTREMITY VENOUS (DVT) Referring Phys: Lyda Perone --------------------------------------------------------------------------------  Indications: Pulmonary embolism.  Risk Factors: Confirmed PE DVT. Anticoagulation: Eliquis. Comparison Study: 06/21/2023 - RIGHT:                   - No evidence of  common femoral vein obstruction.  LEFT:                   - Findings consistent with acute deep vein thrombosis                   involving the left                   popliteal vein, left posterior tibial veins, and left peroneal                   veins.                   - No cystic structure found in the popliteal fossa. Performing Technologist: Chanda Busing RVT  Examination Guidelines: A complete evaluation includes B-mode imaging, spectral Doppler, color Doppler, and power Doppler as needed of all accessible portions of each vessel. Bilateral testing is considered an integral part of a complete examination. Limited examinations for reoccurring indications may be performed as noted. The reflux portion of the exam is performed with the patient in reverse Trendelenburg.  +---------+---------------+---------+-----------+----------+--------------+ RIGHT    CompressibilityPhasicitySpontaneityPropertiesThrombus Aging +---------+---------------+---------+-----------+----------+--------------+ CFV      Full           Yes      Yes                                 +---------+---------------+---------+-----------+----------+--------------+ SFJ      Full                                                        +---------+---------------+---------+-----------+----------+--------------+ FV Prox  Full                                                        +---------+---------------+---------+-----------+----------+--------------+ FV Mid   Full                                                        +---------+---------------+---------+-----------+----------+--------------+ FV DistalFull                                                        +---------+---------------+---------+-----------+----------+--------------+ PFV      Full                                                        +---------+---------------+---------+-----------+----------+--------------+ POP       Full           Yes      Yes                                 +---------+---------------+---------+-----------+----------+--------------+  PTV      Full                                                        +---------+---------------+---------+-----------+----------+--------------+ PERO     Full                                                        +---------+---------------+---------+-----------+----------+--------------+   +---------+---------------+---------+-----------+----------+--------------+ LEFT     CompressibilityPhasicitySpontaneityPropertiesThrombus Aging +---------+---------------+---------+-----------+----------+--------------+ CFV      Full           Yes      Yes                                 +---------+---------------+---------+-----------+----------+--------------+ SFJ      Full                                                        +---------+---------------+---------+-----------+----------+--------------+ FV Prox  Full                                                        +---------+---------------+---------+-----------+----------+--------------+ FV Mid   Partial        Yes      Yes                  Acute          +---------+---------------+---------+-----------+----------+--------------+ FV DistalNone           No       No                   Acute          +---------+---------------+---------+-----------+----------+--------------+ PFV      Full                                                        +---------+---------------+---------+-----------+----------+--------------+ POP      None           No       No                   Acute          +---------+---------------+---------+-----------+----------+--------------+ PTV      None                                         Acute          +---------+---------------+---------+-----------+----------+--------------+ PERO     None  Acute          +---------+---------------+---------+-----------+----------+--------------+ Soleal   None                                         Acute          +---------+---------------+---------+-----------+----------+--------------+ Gastroc  None                                         Acute          +---------+---------------+---------+-----------+----------+--------------+ SSV      Full                                                        +---------+---------------+---------+-----------+----------+--------------+     Summary: RIGHT: - There is no evidence of deep vein thrombosis in the lower extremity.  - No cystic structure found in the popliteal fossa.  LEFT: - Findings consistent with acute deep vein thrombosis involving the left femoral vein, left popliteal vein, left posterior tibial veins, and left peroneal veins. Findings consistent with acute intramuscular thrombosis involving the left gastrocnemius veins, and left soleal veins. - No cystic structure found in the popliteal fossa.  *See table(s) above for measurements and observations. Electronically signed by Carolynn Sayers on 07/27/2023 at 2:16:43 PM.    Final    ECHOCARDIOGRAM LIMITED Result Date: 07/27/2023    ECHOCARDIOGRAM LIMITED REPORT   Patient Name:   SHAYE CHUSTZ Date of Exam: 07/27/2023 Medical Rec #:  [id number]            Height:       71.0 in Accession #:    [id number]           Weight:       256.0 lb Date of Birth:  April 07, 1991            BSA:          2.342 m Patient Age:    32 years             BP:           119/78 mmHg Patient Gender: M                    HR:           87 bpm. Exam Location:  Inpatient Procedure: Limited Echo, Cardiac Doppler and Color Doppler Indications:    I26.02 Pulmonary embolus  History:        Patient has prior history of Echocardiogram examinations, most                 recent 06/23/2023. Signs/Symptoms:Shortness of Breath and                 Dyspnea. Pulmonary embolus.  Cancer. Cor Pulmonale.  Sonographer:    Sheralyn Boatman RDCS Referring Phys: [phone] JARED M GARDNER  Sonographer Comments: Technically difficult study due to poor echo windows. Patient in pain, could not turn. IMPRESSIONS  1. Left ventricular ejection fraction, by estimation, is 60 to 65%. The left ventricle has normal function. The left ventricle has no regional wall motion abnormalities.  2. Right ventricular  systolic function is normal. The right ventricular size is moderately enlarged. Tricuspid regurgitation signal is inadequate for assessing PA pressure.  3. No evidence of mitral valve regurgitation.  4. The inferior vena cava is normal in size with greater than 50% respiratory variability, suggesting right atrial pressure of 3 mmHg. Conclusion(s)/Recommendation(s): RV does look more dilated compared to prior study; c/f RV strain. FINDINGS  Left Ventricle: Left ventricular ejection fraction, by estimation, is 60 to 65%. The left ventricle has normal function. The left ventricle has no regional wall motion abnormalities. The left ventricular internal cavity size was normal in size. Right Ventricle: The right ventricular size is moderately enlarged. Right ventricular systolic function is normal. Tricuspid regurgitation signal is inadequate for assessing PA pressure. Right Atrium: Right atrial size was normal in size. Tricuspid Valve: Tricuspid valve regurgitation is not demonstrated. Venous: The inferior vena cava is normal in size with greater than 50% respiratory variability, suggesting right atrial pressure of 3 mmHg. Additional Comments: Spectral Doppler performed. Color Doppler performed.  LEFT VENTRICLE PLAX 2D LVIDd:         4.30 cm LVIDs:         2.90 cm LV PW:         1.00 cm LV IVS:        1.10 cm LVOT diam:     2.40 cm LV SV:         66 LV SV Index:   28 LVOT Area:     4.52 cm  LV Volumes (MOD) LV vol d, MOD A2C: 68.2 ml LV vol d, MOD A4C: 69.0 ml LV vol s, MOD A2C: 21.6 ml LV vol s, MOD A4C: 21.8 ml LV SV  MOD A2C:     46.6 ml LV SV MOD A4C:     69.0 ml LV SV MOD BP:      47.7 ml RIGHT VENTRICLE             IVC RV S prime:     10.90 cm/s  IVC diam: 1.90 cm TAPSE (M-mode): 2.0 cm RIGHT ATRIUM           Index RA Area:     14.90 cm RA Volume:   40.20 ml  17.17 ml/m  AORTIC VALVE LVOT Vmax:   102.00 cm/s LVOT Vmean:  64.600 cm/s LVOT VTI:    0.145 m  AORTA Ao Asc diam: 3.30 cm MITRAL VALVE MV Area (PHT): 4.60 cm    SHUNTS MV Decel Time: 165 msec    Systemic VTI:  0.14 m MV E velocity: 82.40 cm/s  Systemic Diam: 2.40 cm MV A velocity: 78.40 cm/s MV E/A ratio:  1.05 [place] signed by Carolan Clines Signature Date/Time: 07/27/2023/12:17:48 PM    Final    CT Angio Chest PE W and/or Wo Contrast Result Date: 07/27/2023 CLINICAL DATA:  High probability for PE. EXAM: CT ANGIOGRAPHY CHEST WITH CONTRAST TECHNIQUE: Multidetector CT imaging of the chest was performed using the standard protocol during bolus administration of intravenous contrast. Multiplanar CT image reconstructions and MIPs were obtained to evaluate the vascular anatomy. RADIATION DOSE REDUCTION: This exam was performed according to the departmental dose-optimization program which includes automated exposure control, adjustment of the mA and/or kV according to patient size and/or use of iterative reconstruction technique. CONTRAST:  80mL OMNIPAQUE IOHEXOL 350 MG/ML SOLN COMPARISON:  CT angiogram chest 07/19/2028. FINDINGS: Cardiovascular: There are pulmonary emboli within lobar, segmental and subsegmental branches of the right lower lobe there also pulmonary emboli within  subsegmental branches of the left upper lobe. Aorta is normal in size. Heart is normal in size. There is no pericardial effusion. Right chest port catheter tip ends in the distal SVC. Mediastinum/Nodes: No enlarged mediastinal, hilar, or axillary lymph nodes. Thyroid gland, trachea, and esophagus demonstrate no significant findings. Lungs/Pleura: Peripheral patchy ground-glass  opacities in the right lower lobe are favored as pulmonary infarcts. There is minimal atelectasis in the right middle lobe. No pleural effusion or pneumothorax. Upper Abdomen: No acute abnormality. Musculoskeletal: No chest wall abnormality. No acute or significant osseous findings. Review of the MIP images confirms the above findings. IMPRESSION: 1. Bilateral pulmonary emboli, right greater than left. Positive for acute PE with CTevidence of right heart strain (RV/LV Ratio = 1.3) consistent with at least submassive (intermediate risk) PE. The presence of right heart strain has been associated with an increased risk of morbidity and mortality. 2. Peripheral patchy ground-glass opacities in the right lower lobe are favored as pulmonary infarcts. These results were called by telephone at the time of interpretation on 07/27/2023 at 12:39 am to provider [facility] , who verbally acknowledged these results. Electronically Signed   By: Darliss Cheney M.D.   On: 07/27/2023 00:41   DG Chest 2 View Result Date: 07/26/2023 CLINICAL DATA:  Shortness of breath. History of DVT and pulmonary embolus. Off of Eliquis for couple of weeks. EXAM: CHEST - 2 VIEW COMPARISON:  07/20/2023 FINDINGS: Power port type central venous catheter with tip over the cavoatrial junction region. No pneumothorax. Shallow inspiration. Linear atelectasis or infiltration seen in the right lung base. No pleural effusions. No pneumothorax. Mediastinal contours appear intact. Heart size and pulmonary vascularity are normal. IMPRESSION: Shallow inspiration with linear atelectasis or infiltration in the right lung base. Electronically Signed   By: Burman Nieves M.D.   On: 07/26/2023 23:24   CT Angio Chest PE W and/or Wo Contrast Result Date: 07/20/2023 CLINICAL DATA:  Short of breath and chest pain beginning late last night. Patient stopped taking Eliquis on Tuesday. EXAM: CT ANGIOGRAPHY CHEST WITH CONTRAST TECHNIQUE: Multidetector CT imaging of  the chest was performed using the standard protocol during bolus administration of intravenous contrast. Multiplanar CT image reconstructions and MIPs were obtained to evaluate the vascular anatomy. RADIATION DOSE REDUCTION: This exam was performed according to the departmental dose-optimization program which includes automated exposure control, adjustment of the mA and/or kV according to patient size and/or use of iterative reconstruction technique. CONTRAST:  75mL OMNIPAQUE IOHEXOL 350 MG/ML SOLN COMPARISON:  06/22/2023. FINDINGS: Cardiovascular: Pulmonary arteries are well opacified. No evidence of a pulmonary embolism. Previously noted right-sided pulmonary emboli have resolved. Heart is normal in size and configuration. No pericardial effusion. Great vessels are normal in caliber. No aortic dissection or atherosclerosis. Arch branch vessels are widely patent. Mediastinum/Nodes: No neck base, mediastinal or hilar masses. No enlarged lymph nodes. Trachea and esophagus are unremarkable. Lungs/Pleura: Mild ground-glass opacities in the lower lobes suspected to be atelectasis. More confluent type opacity noted in the lower lobes on the prior study resolved. Remainder of the lungs is clear. No pleural effusion or pneumothorax. Upper Abdomen: No acute findings. 1.9 cm gallstone, dependent aspect of a distended gallbladder. No gallbladder wall thickening or adjacent inflammation. Musculoskeletal: No chest wall abnormality. No acute or significant osseous findings. Review of the MIP images confirms the above findings. IMPRESSION: 1. No evidence of a pulmonary embolism. Previously noted right-sided pulmonary emboli have resolved. 2. No acute findings. 3. Cholelithiasis. 4. Mild ground-glass opacities in the  lower lobes suspected to be atelectasis. Electronically Signed   By: Amie Portland M.D.   On: 07/20/2023 09:15   DG Chest 2 View Result Date: 07/20/2023 CLINICAL DATA:  Chest pain EXAM: CHEST - 2 VIEW COMPARISON:   06/22/2023 FINDINGS: Porta catheter on the right with tip at the upper cavoatrial junction. Normal heart size and mediastinal contours. No acute infiltrate or edema. No effusion or pneumothorax. No acute osseous findings. IMPRESSION: No active cardiopulmonary disease. Electronically Signed   By: Tiburcio Pea M.D.   On: 07/20/2023 06:59    Microbiology: Results for orders placed or performed during the hospital encounter of 07/26/23  MRSA Next Gen by PCR, Nasal     Status: None   Collection Time: 07/28/23  4:20 PM   Specimen: Nasal Mucosa; Nasal Swab  Result Value Ref Range Status   MRSA by PCR Next Gen NOT DETECTED NOT DETECTED Final    Comment: (NOTE) The GeneXpert MRSA Assay (FDA approved for NASAL specimens only), is one component of a comprehensive MRSA colonization surveillance program. It is not intended to diagnose MRSA infection nor to guide or monitor treatment for MRSA infections. Test performance is not FDA approved in patients less than 49 years old. Performed at [facility], [address], [place], [place] [address]    Labs: CBC: Recent Labs  Lab 07/31/23 0458 08/01/23 0313 08/02/23 0425 08/03/23 0515  WBC 6.3 7.0 7.9 9.2  HGB 9.2* 9.3* 9.9* 10.4*  HCT 28.0* 27.6* 30.7* 32.4*  MCV 86.4 86.0 87.2 86.9  PLT 273 254 276 255   Basic Metabolic Panel: Recent Labs  Lab 07/31/23 0458 08/01/23 0313 08/02/23 0425 08/03/23 0515  NA 132* 129* 131* 131*  K 3.9 4.1 4.7 4.3  CL 94* 93* 96* 96*  CO2 29 28 27 27   GLUCOSE 110* 109* 95 95  BUN 14 15 15 18   CREATININE 0.82 0.82 0.90 0.88  CALCIUM 8.4* 8.3* 8.5* 8.6*   Liver Function Tests: No results for input(s): "AST", "ALT", "ALKPHOS", "BILITOT", "PROT", "ALBUMIN" in the last 168 hours. CBG: No results for input(s): "GLUCAP" in the last 168 hours.  Discharge time spent: greater than 30 minutes.  Author: Lynden Oxford, MD  [facility] 08/03/2023

## 2023-08-20 ENCOUNTER — Ambulatory Visit: Payer: 59 | Admitting: Family

## 2023-08-21 ENCOUNTER — Other Ambulatory Visit: Payer: Self-pay

## 2023-08-22 NOTE — Progress Notes (Signed)
 Patient Care Team: Patient, [name] - General (General Practice)  Clinic Day:  08/23/2023  Referring physician: Tina Pauletta BROCKS, MD  ASSESSMENT & PLAN:   Assessment & Plan: 33 y.o. male with diagnosis of left seminoma s/p orchiectomy presented with acute PE.    Diagnosis: pT2 [id number] normal TM. 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  MRI showed benign adrenal nodule.   Treatment: Status post cycle 1 BEP course complicated by severe thrombocytopenia and recurrent PE/DVT.  Primary seminoma of left testis [facility]) Will proceed with cycle 2 as planned on 1/13 Will monitor pancytopenia closely  Acute pulmonary embolism with acute cor pulmonale (HCC) Continue apixaban  5 mg twice daily Follow-up for thrombocytopenia during week 2 and 3  At risk for side effect of medication Given the side effect of pancytopenia from chemotherapy, we will need to monitor for severe thrombocytopenia closely and signs of bleeding while on anticoagulation. Monitor for neutropenic fever Discussed preventing constipation by adding Benefiber, MiraLAX  as needed, stool softener as needed.   Follow up as planned. The patient understands the plans discussed today and is in agreement with them.  He knows to contact our office if he develops concerns prior to his next appointment.  Pauletta BROCKS Tina, MD  [facility] - A DEPT OF [facility] [address] [place] [address] Dept: [phone] Dept Fax: [phone]   No orders of the defined types were placed in this encounter.     CHIEF COMPLAINT:  CC: Testicular cancer  Current Treatment: BEP  INTERVAL HISTORY:  Dashiel is here today for repeat clinical assessment. He denies fevers or chills. He denies pain and stopped using pain med about last week. His appetite is good.  No bloody stool or melena, bloody urine.  No missing apixaban .  No lymph node  swelling, mass, chest pain, abdominal pain. No much short of breath.  I have reviewed the past medical history, past surgical history, social history and family history with the patient and they are unchanged from previous note.  ALLERGIES:  has no known allergies.  MEDICATIONS:  Current Outpatient Medications  Medication Sig Dispense Refill   acetaminophen  (TYLENOL ) 650 MG CR tablet Take 1,300 mg by mouth every 8 (eight) hours as needed for pain.     albuterol  (VENTOLIN  HFA) 108 (90 Base) MCG/ACT inhaler Inhale 1 puff into the lungs every 6 (six) hours as needed for wheezing or shortness of breath.     ALPRAZolam  (XANAX ) 0.25 MG tablet Take 1 tablet (0.25 mg total) by mouth at bedtime as needed for anxiety or sleep. 5 tablet 0   APIXABAN  (ELIQUIS ) VTE STARTER PACK (10MG  AND 5MG ) Take as directed on package: start with two-5mg  tablets twice daily for 7 days. On day 8, switch to one-5mg  tablet twice daily. 74 each 0   bictegravir-emtricitabine -tenofovir  AF (BIKTARVY ) 50-200-25 MG TABS tablet Take 1 tablet by mouth daily. 30 tablet 2   dexamethasone  (DECADRON ) 4 MG tablet Take 2 tablets (8 mg total) by mouth daily. Start the day after last cisplatin  dose on day 6 of chemo x 3 days.Take with food. 30 tablet 1   docusate sodium  (COLACE) 100 MG capsule Take 2 capsules (200 mg total) by mouth 2 (two) times daily. 20 capsule 0   famotidine  (PEPCID ) 20 MG tablet Take 1 tablet (20 mg total) by mouth 2 (two) times daily. 30 tablet 2  lidocaine  (XYLOCAINE ) 2 % solution Use as directed 15 mLs in the mouth or throat every 3 (three) hours as needed for mouth pain. Mix 1 to 1 ratio with Maalox to rinse for pain 100 mL 0   lidocaine -prilocaine  (EMLA ) cream Apply to affected area once 30 g 3   Melatonin 10 MG TABS Take 1 tablet by mouth at bedtime. 30 tablet 0   oxyCODONE -acetaminophen  (PERCOCET) 10-325 MG tablet Take 1 tablet by mouth every 4 (four) hours as needed for pain. 30 tablet 0   polyethylene glycol  (MIRALAX  / GLYCOLAX ) 17 g packet Take 17 g by mouth daily. 14 each 0   prochlorperazine  (COMPAZINE ) 10 MG tablet Take 1 tablet (10 mg total) by mouth every 6 (six) hours as needed for nausea or vomiting. 30 tablet 1   traZODone  (DESYREL ) 50 MG tablet Take 1 tablet (50 mg total) by mouth at bedtime. 90 tablet 1   No current facility-administered medications for this visit.    HISTORY OF PRESENT ILLNESS:   Oncology History  Primary seminoma of left testis (HCC)  04/28/2023 Initial Diagnosis   Primary seminoma of left testis [facility]) Presented to ED for worsening testicular pain and presented with 2 months history of testicular mass.   04/28/2023 Imaging   US  1. Positive for abnormally enlarged and heterogeneous but vascular Left Testis highly suspicious for Testicular Neoplasm. Recommend Urology consultation. 2. Negative for testicular torsion.  Right testis appears normal.   04/28/2023 Imaging   CT CAP 1. Partially visualized thickening of the skin of the left scrotum. 2. Prominent bilateral axillary lymph nodes, right greater than left. Suspect at least 2 lymph nodes with abnormal cortical thickening in the right axilla. 3. Shotty retroperitoneal, bilateral iliac chain lymph nodes measuring up to 1.1 cm in short axis. 4. Bilateral inguinal lymph nodes with borderline thickened left inguinal lymph nodes measuring up to 1.1 cm in short axis. 5. 1.2 cm right adrenal mass, indeterminate. Attention on follow-up is recommended. Alternatively evaluation with MRI may be considered. 6. Hazy attenuation within the anterior mediastinum without defined mass may represent residual thymus. Attention on follow-up is recommended. 7. Cholelithiasis. 8. L4-L5 bilateral pars articularis defect. 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 10. Further evaluation with axillary and inguinal ultrasound may be considered.   05/02/2023 Surgery   TESTICLE, LEFT, ORCHIECTOMY:  Seminoma,  size 6.1 cm  Tumor limited to testis with rete testis and lymphovascular invasion  (pT2)  Germ cell neoplasia in situ is present  Spermatic cord and all margins of resection are negative for tumor     06/25/2023 Imaging   CT AP IMPRESSION: 1. Stable mild periportal, bilateral inguinal, bilateral external iliac  and bilateral common iliac lymphadenopathy. 2. No new or progressive metastatic disease in the abdomen or pelvis. 3. Solid peripheral basilar 2.0 cm right lower lobe pulmonary nodule, new from 04/28/2023 CT, similar to recent 06/22/2023 chest CT, favoring evolving pulmonary infarct related to recent diagnosis of acute pulmonary embolism. Additional patchy consolidation at the posterior right lung base seen on prior chest CT has resolved. 4. Cholelithiasis. 5. Stable bilateral adrenal nodules characterized as benign adenomas on recent MRI, requiring no further imaging follow-up.   07/09/2023 -  Chemotherapy   Patient is on Treatment Plan : TESTICULAR BEP q21d x 3 Cycles         REVIEW OF SYSTEMS:   All relevant systems were reviewed with the patient and are negative.   VITALS:  Blood  pressure 118/77, pulse 84, temperature 97.7 F (36.5 C), temperature source Temporal, resp. rate 16, weight 267 lb 1.9 oz (121.2 kg), SpO2 96%.  Wt Readings from Last 3 Encounters:  08/23/23 267 lb 1.9 oz (121.2 kg)  08/02/23 250 lb 14.1 oz (113.8 kg)  07/24/23 259 lb 3.2 oz (117.6 kg)    Body mass index is 37.26 kg/m.  Performance status (ECOG): 1 - Symptomatic but completely ambulatory  PHYSICAL EXAM:   GENERAL:alert, no distress and comfortable SKIN: skin color normal LUNGS:  normal breathing effort.  Able to take deep breath without chest pain Musculoskeletal: no edema   LABORATORY DATA:  I have reviewed the data as listed    Component Value Date/Time   NA 137 08/23/2023 0814   K 4.1 08/23/2023 0814   CL 106 08/23/2023 0814   CO2 26 08/23/2023 0814   GLUCOSE 90  08/23/2023 0814   BUN 14 08/23/2023 0814   CREATININE 0.85 08/23/2023 0814   CALCIUM 9.1 08/23/2023 0814   PROT 6.9 08/23/2023 0814   ALBUMIN 3.7 08/23/2023 0814   AST 13 (L) 08/23/2023 0814   ALT 24 08/23/2023 0814   ALKPHOS 59 08/23/2023 0814   BILITOT 0.3 08/23/2023 0814   GFRNONAA >60 08/23/2023 0814   GFRAA >90 06/16/2012 0048    No results found for: SPEP, UPEP  Lab Results  Component Value Date   WBC 6.2 08/23/2023   NEUTROABS 2.5 08/23/2023   HGB 12.1 (L) 08/23/2023   HCT 37.4 (L) 08/23/2023   MCV 87.0 08/23/2023   PLT 118 (L) 08/23/2023      Chemistry      Component Value Date/Time   NA 137 08/23/2023 0814   K 4.1 08/23/2023 0814   CL 106 08/23/2023 0814   CO2 26 08/23/2023 0814   BUN 14 08/23/2023 0814   CREATININE 0.85 08/23/2023 0814      Component Value Date/Time   CALCIUM 9.1 08/23/2023 0814   ALKPHOS 59 08/23/2023 0814   AST 13 (L) 08/23/2023 0814   ALT 24 08/23/2023 0814   BILITOT 0.3 08/23/2023 0814       RADIOGRAPHIC STUDIES: I have personally reviewed the radiological images as listed and agreed with the findings in the report. CT Angio Chest Pulmonary Embolism (PE) W or WO Contrast Addendum Date: 07/29/2023 ADDENDUM REPORT: 07/29/2023 06:52 ADDENDUM: Bolus timing on the current study is markedly suboptimal for the pulmonary arteries. Within this limitation, the right lower lobe pulmonary embolus is not substantially changed since the 07/27/2023 exam. Pulmonary embolus seen previously in the left upper lobe is not evident on the current exam, likely due to bolus timing. Electronically Signed   By: Camellia Candle M.D.   On: 07/29/2023 06:52   Result Date: 07/29/2023 CLINICAL DATA:  Pulmonary embolus on CT yesterday. EXAM: CT ANGIOGRAPHY CHEST WITH CONTRAST TECHNIQUE: Multidetector CT imaging of the chest was performed using the standard protocol during bolus administration of intravenous contrast. Multiplanar CT image reconstructions and MIPs  were obtained to evaluate the vascular anatomy. RADIATION DOSE REDUCTION: This exam was performed according to the departmental dose-optimization program which includes automated exposure control, adjustment of the mA and/or kV according to patient size and/or use of iterative reconstruction technique. CONTRAST:  75mL OMNIPAQUE  IOHEXOL  350 MG/ML SOLN COMPARISON:  07/27/2023 FINDINGS: Cardiovascular: The heart size is normal. No substantial pericardial effusion. No thoracic aortic aneurysm. Right Port-A-Cath tip is positioned in the right atrium. Mediastinum/Nodes: 13 mm subcarinal lymph node is more prominent than on yesterday's  study. No left hilar lymphadenopathy. No definite right hilar lymphadenopathy. The esophagus has normal imaging features. There is no axillary lymphadenopathy. Lungs/Pleura: Since yesterday's study, the patient has developed consolidative and ground-glass airspace disease in the right lower lobe with right lower lobe interlobular septal thickening. Given the right lower lobe pulmonary embolus on yesterday's exam, findings likely reflect right lower lobe pulmonary infarct. Subsegmental atelectasis is seen in the right upper lobe and left base although fine detail is obscured by breathing motion. No substantial pleural effusion. Upper Abdomen: Visualized portion of the upper abdomen shows no acute findings. Musculoskeletal: No worrisome lytic or sclerotic osseous abnormality. Review of the MIP images confirms the above findings. IMPRESSION: 1. Since yesterday's study, the patient has developed progressive consolidative and ground-glass airspace disease in the right lower lobe with right lower lobe interlobular septal thickening. Given the right lower lobe pulmonary embolus , findings likely reflect right lower lobe pulmonary infarct. 2. 13 mm subcarinal lymph node is more prominent than on yesterday's study. This is likely reactive. 3. Areas of subsegmental atelectasis in the lung bases  bilaterally. Electronically Signed: By: Camellia Candle M.D. On: 07/28/2023 13:43   DG CHEST PORT 1 VIEW Result Date: 07/28/2023 CLINICAL DATA:  Respiratory distress. EXAM: PORTABLE CHEST 1 VIEW COMPARISON:  07/26/2023.  CT, 07/28/2023 at 12:52 p.m. FINDINGS: Cardiac silhouette normal in size. Stable right anterior chest wall Port-A-Cath. Low lung volumes. Hazy airspace opacity at the right lung base mostly obscures the right hemidiaphragm. Mild opacity noted at the medial left lung base. No pneumothorax. IMPRESSION: 1. Low lung volumes. 2. Right greater than left lung base opacity without convincing change from the CT performed earlier today. Electronically Signed   By: Alm Parkins M.D.   On: 07/28/2023 17:25   ECHOCARDIOGRAM LIMITED Result Date: 07/28/2023    ECHOCARDIOGRAM LIMITED REPORT   Patient Name:   DEMETRIAS GOODBAR Date of Exam: 07/28/2023 Medical Rec #:  [id number]            Height:       71.0 in Accession #:    [id number]           Weight:       256.0 lb Date of Birth:  12-05-1990            BSA:          2.342 m Patient Age:    32 years             BP:           132/76 mmHg Patient Gender: M                    HR:           110 bpm. Exam Location:  Inpatient Procedure: Limited Echo Indications:    RV Strain  History:        Patient has prior history of Echocardiogram examinations, most                 recent 07/27/2023.  Sonographer:    Jayson Gaskins Referring Phys: [id number] MURALI RAMASWAMY IMPRESSIONS  1. Limited echo with poor quality images.  2. Left ventricular ejection fraction, by estimation, is 55 to 60%. The left ventricle has normal function.  3. Right ventricular systolic function is normal. The right ventricular size is normal.  4. The aortic valve is tricuspid. FINDINGS  Left Ventricle: Left ventricular ejection fraction, by estimation, is 55 to 60%. The left ventricle has normal  function. The left ventricular internal cavity size was normal in size. Right Ventricle: The right  ventricular size is normal. Right vetricular wall thickness was not assessed. Right ventricular systolic function is normal. Pericardium: There is no evidence of pericardial effusion. Tricuspid Valve: The tricuspid valve is not assessed. Aortic Valve: The aortic valve is tricuspid. IAS/Shunts: The interatrial septum was not well visualized. Additional Comments: Limited echo with poor quality images.  RIGHT VENTRICLE RV Basal diam:  3.20 cm RV Mid diam:    3.30 cm RV S prime:     19.70 cm/s TAPSE (M-mode): 2.3 cm Maude Emmer MD Electronically signed by Maude Emmer MD Signature Date/Time: 07/28/2023/3:10:07 PM    Final    VAS US  LOWER EXTREMITY VENOUS (DVT) Result Date: 07/27/2023  Lower Venous DVT Study Patient Name:  REZA CRYMES  Date of Exam:   07/27/2023 Medical Rec #: [id number]             Accession #:    [id number] Date of Birth: 19-Aug-1991             Patient Gender: M Patient Age:   72 years Exam Location:  [facility] Procedure:      VAS US  LOWER EXTREMITY VENOUS (DVT) Referring Phys: EMELINE IDOL --------------------------------------------------------------------------------  Indications: Pulmonary embolism.  Risk Factors: Confirmed PE DVT. Anticoagulation: Eliquis . Comparison Study: 06/21/2023 - RIGHT:                   - No evidence of common femoral vein obstruction.                    LEFT:                   - Findings consistent with acute deep vein thrombosis                   involving the left                   popliteal vein, left posterior tibial veins, and left peroneal                   veins.                   - No cystic structure found in the popliteal fossa. Performing Technologist: Cordella Collet RVT  Examination Guidelines: A complete evaluation includes B-mode imaging, spectral Doppler, color Doppler, and power Doppler as needed of all accessible portions of each vessel. Bilateral testing is considered an integral part of a complete examination. Limited examinations  for reoccurring indications may be performed as noted. The reflux portion of the exam is performed with the patient in reverse Trendelenburg.  +---------+---------------+---------+-----------+----------+--------------+ RIGHT    CompressibilityPhasicitySpontaneityPropertiesThrombus Aging +---------+---------------+---------+-----------+----------+--------------+ CFV      Full           Yes      Yes                                 +---------+---------------+---------+-----------+----------+--------------+ SFJ      Full                                                        +---------+---------------+---------+-----------+----------+--------------+ FV Prox  Full                                                        +---------+---------------+---------+-----------+----------+--------------+ FV Mid   Full                                                        +---------+---------------+---------+-----------+----------+--------------+ FV DistalFull                                                        +---------+---------------+---------+-----------+----------+--------------+ PFV      Full                                                        +---------+---------------+---------+-----------+----------+--------------+ POP      Full           Yes      Yes                                 +---------+---------------+---------+-----------+----------+--------------+ PTV      Full                                                        +---------+---------------+---------+-----------+----------+--------------+ PERO     Full                                                        +---------+---------------+---------+-----------+----------+--------------+   +---------+---------------+---------+-----------+----------+--------------+ LEFT     CompressibilityPhasicitySpontaneityPropertiesThrombus Aging  +---------+---------------+---------+-----------+----------+--------------+ CFV      Full           Yes      Yes                                 +---------+---------------+---------+-----------+----------+--------------+ SFJ      Full                                                        +---------+---------------+---------+-----------+----------+--------------+ FV Prox  Full                                                        +---------+---------------+---------+-----------+----------+--------------+  FV Mid   Partial        Yes      Yes                  Acute          +---------+---------------+---------+-----------+----------+--------------+ FV DistalNone           No       No                   Acute          +---------+---------------+---------+-----------+----------+--------------+ PFV      Full                                                        +---------+---------------+---------+-----------+----------+--------------+ POP      None           No       No                   Acute          +---------+---------------+---------+-----------+----------+--------------+ PTV      None                                         Acute          +---------+---------------+---------+-----------+----------+--------------+ PERO     None                                         Acute          +---------+---------------+---------+-----------+----------+--------------+ Soleal   None                                         Acute          +---------+---------------+---------+-----------+----------+--------------+ Gastroc  None                                         Acute          +---------+---------------+---------+-----------+----------+--------------+ SSV      Full                                                        +---------+---------------+---------+-----------+----------+--------------+     Summary: RIGHT: - There is no evidence of  deep vein thrombosis in the lower extremity.  - No cystic structure found in the popliteal fossa.  LEFT: - Findings consistent with acute deep vein thrombosis involving the left femoral vein, left popliteal vein, left posterior tibial veins, and left peroneal veins. Findings consistent with acute intramuscular thrombosis involving the left gastrocnemius veins, and left soleal veins. - No cystic structure found in the popliteal fossa.  *See table(s) above for measurements and observations. Electronically signed by Norman Serve on 07/27/2023 at 2:16:43 PM.  Final    ECHOCARDIOGRAM LIMITED Result Date: 07/27/2023    ECHOCARDIOGRAM LIMITED REPORT   Patient Name:   JHACE FENNELL Date of Exam: 07/27/2023 Medical Rec #:  [id number]            Height:       71.0 in Accession #:    [id number]           Weight:       256.0 lb Date of Birth:  06/22/1991            BSA:          2.342 m Patient Age:    32 years             BP:           119/78 mmHg Patient Gender: M                    HR:           87 bpm. Exam Location:  Inpatient Procedure: Limited Echo, Cardiac Doppler and Color Doppler Indications:    I26.02 Pulmonary embolus  History:        Patient has prior history of Echocardiogram examinations, most                 recent 06/23/2023. Signs/Symptoms:Shortness of Breath and                 Dyspnea. Pulmonary embolus. Cancer. Cor Pulmonale.  Sonographer:    Ellouise Mose RDCS Referring Phys: [phone] JARED M GARDNER  Sonographer Comments: Technically difficult study due to poor echo windows. Patient in pain, could not turn. IMPRESSIONS  1. Left ventricular ejection fraction, by estimation, is 60 to 65%. The left ventricle has normal function. The left ventricle has no regional wall motion abnormalities.  2. Right ventricular systolic function is normal. The right ventricular size is moderately enlarged. Tricuspid regurgitation signal is inadequate for assessing PA pressure.  3. No evidence of mitral valve regurgitation.   4. The inferior vena cava is normal in size with greater than 50% respiratory variability, suggesting right atrial pressure of 3 mmHg. Conclusion(s)/Recommendation(s): RV does look more dilated compared to prior study; c/f RV strain. FINDINGS  Left Ventricle: Left ventricular ejection fraction, by estimation, is 60 to 65%. The left ventricle has normal function. The left ventricle has no regional wall motion abnormalities. The left ventricular internal cavity size was normal in size. Right Ventricle: The right ventricular size is moderately enlarged. Right ventricular systolic function is normal. Tricuspid regurgitation signal is inadequate for assessing PA pressure. Right Atrium: Right atrial size was normal in size. Tricuspid Valve: Tricuspid valve regurgitation is not demonstrated. Venous: The inferior vena cava is normal in size with greater than 50% respiratory variability, suggesting right atrial pressure of 3 mmHg. Additional Comments: Spectral Doppler performed. Color Doppler performed.  LEFT VENTRICLE PLAX 2D LVIDd:         4.30 cm LVIDs:         2.90 cm LV PW:         1.00 cm LV IVS:        1.10 cm LVOT diam:     2.40 cm LV SV:         66 LV SV Index:   28 LVOT Area:     4.52 cm  LV Volumes (MOD) LV vol d, MOD A2C: 68.2 ml LV vol d, MOD A4C: 69.0 ml LV vol s, MOD A2C: 21.6 ml LV  vol s, MOD A4C: 21.8 ml LV SV MOD A2C:     46.6 ml LV SV MOD A4C:     69.0 ml LV SV MOD BP:      47.7 ml RIGHT VENTRICLE             IVC RV S prime:     10.90 cm/s  IVC diam: 1.90 cm TAPSE (M-mode): 2.0 cm RIGHT ATRIUM           Index RA Area:     14.90 cm RA Volume:   40.20 ml  17.17 ml/m  AORTIC VALVE LVOT Vmax:   102.00 cm/s LVOT Vmean:  64.600 cm/s LVOT VTI:    0.145 m  AORTA Ao Asc diam: 3.30 cm MITRAL VALVE MV Area (PHT): 4.60 cm    SHUNTS MV Decel Time: 165 msec    Systemic VTI:  0.14 m MV E velocity: 82.40 cm/s  Systemic Diam: 2.40 cm MV A velocity: 78.40 cm/s MV E/A ratio:  1.05 Ronal Alvan Horning signed by  Ronal Alvan Signature Date/Time: 07/27/2023/12:17:48 PM    Final    CT Angio Chest PE W and/or Wo Contrast Result Date: 07/27/2023 CLINICAL DATA:  High probability for PE. EXAM: CT ANGIOGRAPHY CHEST WITH CONTRAST TECHNIQUE: Multidetector CT imaging of the chest was performed using the standard protocol during bolus administration of intravenous contrast. Multiplanar CT image reconstructions and MIPs were obtained to evaluate the vascular anatomy. RADIATION DOSE REDUCTION: This exam was performed according to the departmental dose-optimization program which includes automated exposure control, adjustment of the mA and/or kV according to patient size and/or use of iterative reconstruction technique. CONTRAST:  80mL OMNIPAQUE  IOHEXOL  350 MG/ML SOLN COMPARISON:  CT angiogram chest 07/19/2028. FINDINGS: Cardiovascular: There are pulmonary emboli within lobar, segmental and subsegmental branches of the right lower lobe there also pulmonary emboli within subsegmental branches of the left upper lobe. Aorta is normal in size. Heart is normal in size. There is no pericardial effusion. Right chest port catheter tip ends in the distal SVC. Mediastinum/Nodes: No enlarged mediastinal, hilar, or axillary lymph nodes. Thyroid gland, trachea, and esophagus demonstrate no significant findings. Lungs/Pleura: Peripheral patchy ground-glass opacities in the right lower lobe are favored as pulmonary infarcts. There is minimal atelectasis in the right middle lobe. No pleural effusion or pneumothorax. Upper Abdomen: No acute abnormality. Musculoskeletal: No chest wall abnormality. No acute or significant osseous findings. Review of the MIP images confirms the above findings. IMPRESSION: 1. Bilateral pulmonary emboli, right greater than left. Positive for acute PE with CTevidence of right heart strain (RV/LV Ratio = 1.3) consistent with at least submassive (intermediate risk) PE. The presence of right heart strain has been associated with  an increased risk of morbidity and mortality. 2. Peripheral patchy ground-glass opacities in the right lower lobe are favored as pulmonary infarcts. These results were called by telephone at the time of interpretation on 07/27/2023 at 12:39 am to provider [facility] , who verbally acknowledged these results. Electronically Signed   By: Greig Pique M.D.   On: 07/27/2023 00:41   DG Chest 2 View Result Date: 07/26/2023 CLINICAL DATA:  Shortness of breath. History of DVT and pulmonary embolus. Off of Eliquis  for couple of weeks. EXAM: CHEST - 2 VIEW COMPARISON:  07/20/2023 FINDINGS: Power port type central venous catheter with tip over the cavoatrial junction region. No pneumothorax. Shallow inspiration. Linear atelectasis or infiltration seen in the right lung base. No pleural effusions. No pneumothorax. Mediastinal contours appear intact. Heart size  and pulmonary vascularity are normal. IMPRESSION: Shallow inspiration with linear atelectasis or infiltration in the right lung base. Electronically Signed   By: Elsie Gravely M.D.   On: 07/26/2023 23:24

## 2023-08-23 ENCOUNTER — Inpatient Hospital Stay: Payer: 59

## 2023-08-23 ENCOUNTER — Other Ambulatory Visit (HOSPITAL_COMMUNITY): Payer: Self-pay

## 2023-08-23 ENCOUNTER — Inpatient Hospital Stay (HOSPITAL_BASED_OUTPATIENT_CLINIC_OR_DEPARTMENT_OTHER): Payer: 59

## 2023-08-23 ENCOUNTER — Other Ambulatory Visit: Payer: Self-pay

## 2023-08-23 VITALS — BP 118/77 | HR 84 | Temp 97.7°F | Resp 16 | Wt 267.1 lb

## 2023-08-23 DIAGNOSIS — D696 Thrombocytopenia, unspecified: Secondary | ICD-10-CM | POA: Diagnosis not present

## 2023-08-23 DIAGNOSIS — Z5111 Encounter for antineoplastic chemotherapy: Secondary | ICD-10-CM | POA: Diagnosis present

## 2023-08-23 DIAGNOSIS — B20 Human immunodeficiency virus [HIV] disease: Secondary | ICD-10-CM | POA: Diagnosis not present

## 2023-08-23 DIAGNOSIS — I2699 Other pulmonary embolism without acute cor pulmonale: Secondary | ICD-10-CM | POA: Insufficient documentation

## 2023-08-23 DIAGNOSIS — I2609 Other pulmonary embolism with acute cor pulmonale: Secondary | ICD-10-CM

## 2023-08-23 DIAGNOSIS — C6292 Malignant neoplasm of left testis, unspecified whether descended or undescended: Secondary | ICD-10-CM | POA: Diagnosis not present

## 2023-08-23 DIAGNOSIS — Z9189 Other specified personal risk factors, not elsewhere classified: Secondary | ICD-10-CM | POA: Diagnosis not present

## 2023-08-23 LAB — CMP (CANCER CENTER ONLY)
ALT: 24 U/L (ref 0–44)
AST: 13 U/L — ABNORMAL LOW (ref 15–41)
Albumin: 3.7 g/dL (ref 3.5–5.0)
Alkaline Phosphatase: 59 U/L (ref 38–126)
Anion gap: 5 (ref 5–15)
BUN: 14 mg/dL (ref 6–20)
CO2: 26 mmol/L (ref 22–32)
Calcium: 9.1 mg/dL (ref 8.9–10.3)
Chloride: 106 mmol/L (ref 98–111)
Creatinine: 0.85 mg/dL (ref 0.61–1.24)
GFR, Estimated: >60 mL/min (ref >60)
Glucose, Bld: 90 mg/dL (ref 70–99)
Potassium: 4.1 mmol/L (ref 3.5–5.1)
Sodium: 137 mmol/L (ref 135–145)
Total Bilirubin: 0.3 mg/dL (ref 0.0–1.2)
Total Protein: 6.9 g/dL (ref 6.5–8.1)

## 2023-08-23 LAB — CBC WITH DIFFERENTIAL (CANCER CENTER ONLY)
Abs Immature Granulocytes: 0.03 10*3/uL (ref 0.00–0.07)
Basophils Absolute: 0 10*3/uL (ref 0.0–0.1)
Basophils Relative: 1 %
Eosinophils Absolute: 0.3 10*3/uL (ref 0.0–0.5)
Eosinophils Relative: 5 %
HCT: 37.4 % — ABNORMAL LOW (ref 39.0–52.0)
Hemoglobin: 12.1 g/dL — ABNORMAL LOW (ref 13.0–17.0)
Immature Granulocytes: 1 %
Lymphocytes Relative: 44 %
Lymphs Abs: 2.8 10*3/uL (ref 0.7–4.0)
MCH: 28.1 pg (ref 26.0–34.0)
MCHC: 32.4 g/dL (ref 30.0–36.0)
MCV: 87 fL (ref 80.0–100.0)
Monocytes Absolute: 0.5 10*3/uL (ref 0.1–1.0)
Monocytes Relative: 8 %
Neutro Abs: 2.5 10*3/uL (ref 1.7–7.7)
Neutrophils Relative %: 41 %
Platelet Count: 118 10*3/uL — ABNORMAL LOW (ref 150–400)
RBC: 4.3 MIL/uL (ref 4.22–5.81)
RDW: 17.4 % — ABNORMAL HIGH (ref 11.5–15.5)
WBC Count: 6.2 10*3/uL (ref 4.0–10.5)
nRBC: 0 % (ref 0.0–0.2)

## 2023-08-23 LAB — MAGNESIUM: Magnesium: 1.8 mg/dL (ref 1.7–2.4)

## 2023-08-23 LAB — LACTATE DEHYDROGENASE: LDH: 141 U/L (ref 98–192)

## 2023-08-23 MED ORDER — HEPARIN SOD (PORK) LOCK FLUSH 100 UNIT/ML IV SOLN
500.0000 [IU] | Freq: Once | INTRAVENOUS | Status: AC
  Administered 2023-08-23: 500 [IU]

## 2023-08-23 MED ORDER — SODIUM CHLORIDE 0.9% FLUSH
10.0000 mL | Freq: Once | INTRAVENOUS | Status: AC
  Administered 2023-08-23: 10 mL

## 2023-08-23 NOTE — Assessment & Plan Note (Signed)
 Continue apixaban 5 mg twice daily Follow-up for thrombocytopenia during week 2 and 3

## 2023-08-23 NOTE — Assessment & Plan Note (Signed)
 Will proceed with cycle 2 as planned on 1/13 Will monitor pancytopenia closely

## 2023-08-23 NOTE — Assessment & Plan Note (Signed)
 Given the side effect of pancytopenia from chemotherapy, we will need to monitor for severe thrombocytopenia closely and signs of bleeding while on anticoagulation. Monitor for neutropenic fever Discussed preventing constipation by adding Benefiber, MiraLAX as needed, stool softener as needed.

## 2023-08-24 LAB — AFP TUMOR MARKER: AFP, Serum, Tumor Marker: <1.8 ng/mL (ref 0.0–6.9)

## 2023-08-24 LAB — BETA HCG QUANT (REF LAB): hCG Quant: <1 m[IU]/mL (ref 0–3)

## 2023-08-27 ENCOUNTER — Telehealth: Payer: Self-pay

## 2023-08-27 NOTE — Telephone Encounter (Signed)
 Patient is aware of appointment time changes made on 09/04/2023 due to staff meeting in infusion

## 2023-08-30 ENCOUNTER — Other Ambulatory Visit (HOSPITAL_COMMUNITY): Payer: Self-pay

## 2023-08-31 ENCOUNTER — Ambulatory Visit: Payer: 59

## 2023-08-31 MED FILL — Fosaprepitant Dimeglumine For IV Infusion 150 MG (Base Eq): INTRAVENOUS | Qty: 5 | Status: AC

## 2023-08-31 NOTE — Assessment & Plan Note (Deleted)
Continue apixaban 5 mg twice daily Follow-up for thrombocytopenia during week 2 and 3

## 2023-08-31 NOTE — Progress Notes (Deleted)
 Patient Care Team: Patient, [name] - General (General Practice)  Clinic Day:  08/31/2023  Referring physician: Tina Pauletta BROCKS, MD  ASSESSMENT & PLAN:   Assessment & Plan: 33 y.o. male with diagnosis of left seminoma s/p orchiectomy presented with acute PE.    Diagnosis: pT2 [id number] normal TM. 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  MRI showed benign adrenal nodule.   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 Monitor for neutropenic fever Discussed preventing constipation by adding Benefiber, MiraLAX  as needed, stool softener as needed.    The patient understands the plans discussed today and is in agreement with them.  He knows to contact our office if he develops concerns prior to his next appointment.  Pauletta BROCKS Tina, MD   [facility] - A DEPT OF [facility] [address] [place] [address] Dept: [phone] Dept Fax: [phone]   No orders of the defined types were placed in this encounter.     CHIEF COMPLAINT:  CC: ***  Current Treatment:  ***  INTERVAL HISTORY:  Steve Stewart is here today for repeat clinical assessment. He denies fevers or chills. He denies pain. His appetite is good. His weight {Weight change:10426}.  I have reviewed the past medical history, past surgical history, social history and family history with the patient and they are unchanged from previous note.  ALLERGIES:   has no known allergies.  MEDICATIONS:  Current Outpatient Medications  Medication Sig Dispense Refill   acetaminophen  (TYLENOL ) 650 MG CR tablet Take 1,300 mg by mouth every 8 (eight) hours as needed for pain.     albuterol  (VENTOLIN  HFA) 108 (90 Base) MCG/ACT inhaler Inhale 1 puff into the lungs every 6 (six) hours as needed for wheezing or shortness of breath.     ALPRAZolam  (XANAX ) 0.25 MG tablet Take 1 tablet (0.25 mg total) by mouth at bedtime as needed for anxiety or sleep. 5 tablet 0   APIXABAN  (ELIQUIS ) VTE STARTER PACK (10MG  AND 5MG ) Take as directed on package: start with two-5mg  tablets twice daily for 7 days. On day 8, switch to one-5mg  tablet twice daily. 74 each 0   bictegravir-emtricitabine -tenofovir  AF (BIKTARVY ) 50-200-25 MG TABS tablet Take 1 tablet by mouth daily. 30 tablet 2   dexamethasone  (DECADRON ) 4 MG tablet Take 2 tablets (8 mg total) by mouth daily. Start the day after last cisplatin  dose on day 6 of chemo x 3 days.Take with food. 30 tablet 1   docusate sodium  (COLACE) 100 MG capsule Take 2 capsules (200 mg total) by mouth 2 (two) times daily. 20 capsule 0   famotidine  (PEPCID ) 20 MG tablet Take 1 tablet (20 mg total) by mouth 2 (two) times daily. 30 tablet 2   lidocaine  (XYLOCAINE ) 2 % solution Use as directed 15 mLs in the mouth or throat every 3 (three) hours as needed for mouth pain. Mix 1 to 1 ratio with Maalox to rinse for pain 100 mL  0   lidocaine -prilocaine  (EMLA ) cream Apply to affected area once 30 g 3   Melatonin 10 MG TABS Take 1 tablet by mouth at bedtime. 30 tablet 0   oxyCODONE -acetaminophen  (PERCOCET) 10-325 MG tablet Take 1 tablet by mouth every 4 (four) hours as needed for pain. 30 tablet 0   polyethylene glycol (MIRALAX  / GLYCOLAX ) 17 g packet Take 17 g by mouth daily. 14 each 0   prochlorperazine  (COMPAZINE ) 10 MG tablet Take 1 tablet (10 mg total) by mouth every 6 (six) hours as needed for nausea or vomiting. 30 tablet 1   traZODone  (DESYREL ) 50 MG  tablet Take 1 tablet (50 mg total) by mouth at bedtime. 90 tablet 1   No current facility-administered medications for this visit.    HISTORY OF PRESENT ILLNESS:   Oncology History  Primary seminoma of left testis (HCC)  04/28/2023 Initial Diagnosis   Primary seminoma of left testis [facility]) Presented to ED for worsening testicular pain and presented with 2 months history of testicular mass.   04/28/2023 Imaging   US  1. Positive for abnormally enlarged and heterogeneous but vascular Left Testis highly suspicious for Testicular Neoplasm. Recommend Urology consultation. 2. Negative for testicular torsion.  Right testis appears normal.   04/28/2023 Imaging   CT CAP 1. Partially visualized thickening of the skin of the left scrotum. 2. Prominent bilateral axillary lymph nodes, right greater than left. Suspect at least 2 lymph nodes with abnormal cortical thickening in the right axilla. 3. Shotty retroperitoneal, bilateral iliac chain lymph nodes measuring up to 1.1 cm in short axis. 4. Bilateral inguinal lymph nodes with borderline thickened left inguinal lymph nodes measuring up to 1.1 cm in short axis. 5. 1.2 cm right adrenal mass, indeterminate. Attention on follow-up is recommended. Alternatively evaluation with MRI may be considered. 6. Hazy attenuation within the anterior mediastinum without defined mass may represent residual thymus. Attention on follow-up is recommended. 7. Cholelithiasis. 8. L4-L5 bilateral pars articularis defect. 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 10. Further evaluation with axillary and inguinal ultrasound may be considered.   05/02/2023 Surgery   TESTICLE, LEFT, ORCHIECTOMY:  Seminoma, size 6.1 cm  Tumor limited to testis with rete testis and lymphovascular invasion  (pT2)  Germ cell neoplasia in situ is present  Spermatic cord and all margins of resection are negative for tumor     06/25/2023 Imaging   CT  AP IMPRESSION: 1. Stable mild periportal, bilateral inguinal, bilateral external iliac  and bilateral common iliac lymphadenopathy. 2. No new or progressive metastatic disease in the abdomen or pelvis. 3. Solid peripheral basilar 2.0 cm right lower lobe pulmonary nodule, new from 04/28/2023 CT, similar to recent 06/22/2023 chest CT, favoring evolving pulmonary infarct related to recent diagnosis of acute pulmonary embolism. Additional patchy consolidation at the posterior right lung base seen on prior chest CT has resolved. 4. Cholelithiasis. 5. Stable bilateral adrenal nodules characterized as benign adenomas on recent MRI, requiring no further imaging follow-up.   07/09/2023 -  Chemotherapy   Patient is on Treatment Plan : TESTICULAR BEP q21d x 3 Cycles         REVIEW OF SYSTEMS:   All relevant systems were reviewed with the patient and are negative.   VITALS:  There were no vitals taken for this visit.  Wt Readings from Last 3 Encounters:  08/23/23 267 lb 1.9 oz (121.2 kg)  08/02/23 250 lb 14.1 oz (113.8 kg)  07/24/23 259 lb 3.2 oz (117.6  kg)    There is no height or weight on file to calculate BMI.  Performance status (ECOG): {CHL ONC [id number]  PHYSICAL EXAM:   GENERAL:alert, no distress and comfortable SKIN: skin color normal, no rashes  EYES: normal, sclera clear OROPHARYNX: no exudate, no erythema    NECK: supple,  non-tender, without nodularity LYMPH:  no palpable cervical lymphadenopathy LUNGS: clear to auscultation with normal breathing effort.  No wheeze or rales HEART: regular rate & rhythm and no murmurs and no lower extremity edema ABDOMEN: abdomen soft, non-tender and nondistended Musculoskeletal: no edema NEURO: alert, fluent speech, no focal motor/sensory deficits.  Strength and sensation equal bilaterally.  LABORATORY DATA:  I have reviewed the data as listed    Component Value Date/Time   NA 137 08/23/2023 0814   K 4.1 08/23/2023 0814    CL 106 08/23/2023 0814   CO2 26 08/23/2023 0814   GLUCOSE 90 08/23/2023 0814   BUN 14 08/23/2023 0814   CREATININE 0.85 08/23/2023 0814   CALCIUM 9.1 08/23/2023 0814   PROT 6.9 08/23/2023 0814   ALBUMIN 3.7 08/23/2023 0814   AST 13 (L) 08/23/2023 0814   ALT 24 08/23/2023 0814   ALKPHOS 59 08/23/2023 0814   BILITOT 0.3 08/23/2023 0814   GFRNONAA >60 08/23/2023 0814   GFRAA >90 06/16/2012 0048    No results found for: SPEP, UPEP  Lab Results  Component Value Date   WBC 6.2 08/23/2023   NEUTROABS 2.5 08/23/2023   HGB 12.1 (L) 08/23/2023   HCT 37.4 (L) 08/23/2023   MCV 87.0 08/23/2023   PLT 118 (L) 08/23/2023      Chemistry      Component Value Date/Time   NA 137 08/23/2023 0814   K 4.1 08/23/2023 0814   CL 106 08/23/2023 0814   CO2 26 08/23/2023 0814   BUN 14 08/23/2023 0814   CREATININE 0.85 08/23/2023 0814      Component Value Date/Time   CALCIUM 9.1 08/23/2023 0814   ALKPHOS 59 08/23/2023 0814   AST 13 (L) 08/23/2023 0814   ALT 24 08/23/2023 0814   BILITOT 0.3 08/23/2023 0814       RADIOGRAPHIC STUDIES: I have personally reviewed the radiological images as listed and agreed with the findings in the report. No results found.

## 2023-08-31 NOTE — Assessment & Plan Note (Deleted)
Will proceed with cycle 2 as planned on 1/13 Omit day 5 of etoposide Will monitor pancytopenia closely

## 2023-08-31 NOTE — Assessment & Plan Note (Deleted)
 Given the side effect of pancytopenia from chemotherapy, we will need to monitor for severe thrombocytopenia closely and signs of bleeding while on anticoagulation. Monitor for neutropenic fever Discussed preventing constipation by adding Benefiber, MiraLAX as needed, stool softener as needed.

## 2023-09-03 ENCOUNTER — Inpatient Hospital Stay: Payer: 59

## 2023-09-03 ENCOUNTER — Other Ambulatory Visit (HOSPITAL_COMMUNITY): Payer: Self-pay

## 2023-09-03 ENCOUNTER — Telehealth: Payer: Self-pay

## 2023-09-03 DIAGNOSIS — Z9189 Other specified personal risk factors, not elsewhere classified: Secondary | ICD-10-CM

## 2023-09-03 DIAGNOSIS — I2609 Other pulmonary embolism with acute cor pulmonale: Secondary | ICD-10-CM

## 2023-09-03 DIAGNOSIS — C6292 Malignant neoplasm of left testis, unspecified whether descended or undescended: Secondary | ICD-10-CM

## 2023-09-03 NOTE — Telephone Encounter (Signed)
 VM received from pt, stating there is an issue w/ getting his Eliquis Rx. Returned call and there was no answer. VM stated that mailbox was full--unable to leave message.

## 2023-09-04 ENCOUNTER — Ambulatory Visit: Payer: 59

## 2023-09-05 ENCOUNTER — Other Ambulatory Visit: Payer: Self-pay

## 2023-09-05 ENCOUNTER — Other Ambulatory Visit (HOSPITAL_COMMUNITY): Payer: Self-pay

## 2023-09-05 ENCOUNTER — Ambulatory Visit: Payer: 59

## 2023-09-05 DIAGNOSIS — C6292 Malignant neoplasm of left testis, unspecified whether descended or undescended: Secondary | ICD-10-CM

## 2023-09-05 MED ORDER — ELIQUIS DVT/PE STARTER PACK 5 MG PO TBPK
ORAL_TABLET | ORAL | 0 refills | Status: DC
  Filled 2023-09-05: qty 74, 28d supply, fill #0

## 2023-09-06 ENCOUNTER — Ambulatory Visit: Payer: 59

## 2023-09-06 ENCOUNTER — Telehealth: Payer: Self-pay

## 2023-09-06 NOTE — Telephone Encounter (Signed)
TC to pt per Dr. Cherly Hensen d/t Eliquis starter pack being submitted for refill yesterday. Pt was supposed to be taking maintenance Eliquis 5 mg bid. Verified that pt has been taking 5 mg bid. Advised not to follow directions on the starter pack that he picked up yesterday--to only take 5 mg bid as he has been doing. Pt verbalizes understanding. Pt asks about disability paperwork that he submitted in December. Advised that someone from our forms department would be contacted and he should hear something from them soon.

## 2023-09-06 NOTE — Telephone Encounter (Signed)
Mandy please follow up with him. He is supposed to be on apixaban for a while now and therefore should be on 5 mg twice daily. I see the starter pack sent but he should not be on 10 mg starter pack. Thanks.

## 2023-09-07 ENCOUNTER — Ambulatory Visit: Payer: 59

## 2023-09-12 ENCOUNTER — Other Ambulatory Visit (HOSPITAL_COMMUNITY): Payer: Self-pay

## 2023-09-14 MED FILL — Fosaprepitant Dimeglumine For IV Infusion 150 MG (Base Eq): INTRAVENOUS | Qty: 5 | Status: AC

## 2023-09-14 NOTE — Assessment & Plan Note (Signed)
Will proceed with cycle 2 this week Omit day 5 of etoposide Will monitor pancytopenia closely

## 2023-09-14 NOTE — Assessment & Plan Note (Signed)
Given the side effect of pancytopenia from chemotherapy, we will need to monitor for severe thrombocytopenia closely and signs of bleeding while on anticoagulation. Monitor for neutropenic fever Discussed preventing constipation by adding Benefiber, MiraLAX as needed, stool softener as needed.

## 2023-09-14 NOTE — Progress Notes (Unsigned)
Patient Care Team: Patient, [name] - General (General Practice)  Clinic Day:  09/17/2023  Referring physician: [name], MD  ASSESSMENT & PLAN:   Assessment & Plan: 33 y.o. male with diagnosis of left seminoma s/p orchiectomy presented for follow up for treatment of testicular cancer undergoing BEP   Diagnosis: pT2 [id number] normal TM. 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  MRI showed benign adrenal nodule.   Treatment: Status post cycle 1 BEP course complicated by severe thrombocytopenia and recurrent PE/DVT.  Patient is well, recovered without additional symptoms of PE.  Proceed with cycle 2 today.  Discussed close follow-up during week 2 and 3 for thrombocytopenia, bleeding.  We have any intensive monitoring with labs in the next few weeks.  Social worker will come meet with him today.  Primary seminoma of left testis (HCC) Will proceed with cycle 2 this week Omit day 5 of etoposide Will monitor pancytopenia closely  Acute pulmonary embolism with acute cor pulmonale (HCC) Continue apixaban 5 mg twice daily Follow-up for thrombocytopenia during week 2 and 3  At risk for side effect of medication Given the side effect of pancytopenia from chemotherapy, we will need to monitor for severe thrombocytopenia closely and signs of bleeding while on anticoagulation. Monitor for neutropenic fever Discussed preventing constipation by adding Benefiber, MiraLAX as needed, stool softener as needed.  HIV disease [facility]) Recent diagnosis beginning of November 2024 Continue Biktarvy    The patient understands the plans discussed today and is in agreement with them.  He knows to contact our office if he develops concerns prior to his next appointment.  [name], MD  [facility] - A DEPT [facility] [address] [place] [address] Dept: [phone] Dept Fax:  [phone]    CHIEF COMPLAINT:  CC: Seminoma  Current Treatment: BEP  INTERVAL HISTORY:  Steve Stewart is here today for repeat clinical assessment. He denies fevers or chills. He has recovered well from PE last month.  He is on anticoagulation.  No additional chest pain, difficulty breathing or shortness of breath.  Some swelling in the left leg improved in the mornings.  It comes and goes.  No bleeding, bloody stool.  He is taking apixaban twice daily.  No other symptoms.  I have reviewed the past medical history, past surgical history, social history and family history with the patient and they are unchanged from previous note.  ALLERGIES:  has no known allergies.  MEDICATIONS:  Current Outpatient Medications  Medication Sig Dispense Refill   acetaminophen (TYLENOL) 650 MG CR tablet Take 1,300 mg by mouth every 8 (eight) hours as needed for pain.     albuterol (VENTOLIN HFA) 108 (90 Base) MCG/ACT inhaler Inhale 1 puff into the lungs every 6 (six) hours as needed for wheezing or shortness of breath.     ALPRAZolam (XANAX) 0.25 MG tablet Take 1 tablet (0.25 mg total) by mouth at bedtime as needed for anxiety or sleep. 5 tablet 0   Apixaban Starter Pack, 10mg  and 5mg , (ELIQUIS DVT/PE STARTER PACK) Take as directed on package: start with two-5mg  tablets twice daily for 7 days. On day 8, switch to one-5mg  tablet twice daily. 74 each 0   bictegravir-emtricitabine-tenofovir AF (BIKTARVY) 50-200-25 MG TABS tablet Take 1 tablet by mouth daily. 30 tablet 2   dexamethasone (DECADRON) 4 MG tablet Take 2 tablets (8 mg total) by  mouth daily. Start the day after last cisplatin dose on day 6 of chemo x 3 days.Take with food. 30 tablet 1   docusate sodium (COLACE) 100 MG capsule Take 2 capsules (200 mg total) by mouth 2 (two) times daily. 20 capsule 0   famotidine (PEPCID) 20 MG tablet Take 1 tablet (20 mg total) by mouth 2 (two) times daily. 30 tablet 2   lidocaine (XYLOCAINE) 2 % solution Use as directed 15  mLs in the mouth or throat every 3 (three) hours as needed for mouth pain. Mix 1 to 1 ratio with Maalox to rinse for pain 100 mL 0   lidocaine-prilocaine (EMLA) cream Apply to affected area once 30 g 3   Melatonin 10 MG TABS Take 1 tablet by mouth at bedtime. 30 tablet 0   oxyCODONE-acetaminophen (PERCOCET) 10-325 MG tablet Take 1 tablet by mouth every 4 (four) hours as needed for pain. 30 tablet 0   polyethylene glycol (MIRALAX / GLYCOLAX) 17 g packet Take 17 g by mouth daily. 14 each 0   prochlorperazine (COMPAZINE) 10 MG tablet Take 1 tablet (10 mg total) by mouth every 6 (six) hours as needed for nausea or vomiting. 30 tablet 1   traZODone (DESYREL) 50 MG tablet Take 1 tablet (50 mg total) by mouth at bedtime. 90 tablet 1   No current facility-administered medications for this visit.   Facility-Administered Medications Ordered in Other Visits  Medication Dose Route Frequency Provider Last Rate Last Admin   CISplatin (PLATINOL) 50 mg in sodium chloride 0.9 % 250 mL chemo infusion  20 mg/m2 (Treatment Plan Recorded) Intravenous Once [name], MD       dexamethasone (DECADRON) injection 10 mg  10 mg Intravenous Once [name], MD       etoposide (VEPESID) 240 mg in sodium chloride 0.9 % 1,000 mL chemo infusion  100 mg/m2 (Treatment Plan Recorded) Intravenous Once [name], MD       fosaprepitant (EMEND) 150 mg in sodium chloride 0.9 % 145 mL IVPB  150 mg Intravenous Once [name], MD       heparin lock flush 100 unit/mL  500 Units Intracatheter Once PRN [name], MD       magnesium sulfate IVPB 2 g 50 mL  2 g Intravenous Once [name], MD 50 mL/hr at 09/17/23 1022 2 g at 09/17/23 1022   palonosetron (ALOXI) injection 0.25 mg  0.25 mg Intravenous Once [name], MD       sodium chloride flush (NS) 0.9 % injection 10 mL  10 mL Intracatheter PRN [name], MD        HISTORY OF PRESENT ILLNESS:   Oncology History  Primary seminoma of left  testis (HCC)  04/28/2023 Initial Diagnosis   Primary seminoma of left testis [facility]) Presented to ED for worsening testicular pain and presented with 2 months history of testicular mass.   04/28/2023 Imaging   [place] 1. Positive for abnormally enlarged and heterogeneous but vascular Left Testis highly suspicious for Testicular Neoplasm. Recommend Urology consultation. 2. Negative for testicular torsion.  Right testis appears normal.   04/28/2023 Imaging   CT CAP 1. Partially visualized thickening of the skin of the left scrotum. 2. Prominent bilateral axillary lymph nodes, right greater than left. Suspect at least 2 lymph nodes with abnormal cortical thickening in the right axilla. 3. Shotty retroperitoneal, bilateral iliac chain lymph nodes measuring up to 1.1 cm in short axis. 4. Bilateral  inguinal lymph nodes with borderline thickened left inguinal lymph nodes measuring up to 1.1 cm in short axis. 5. 1.2 cm right adrenal mass, indeterminate. Attention on follow-up is recommended. Alternatively evaluation with MRI may be considered. 6. Hazy attenuation within the anterior mediastinum without defined mass may represent residual thymus. Attention on follow-up is recommended. 7. Cholelithiasis. 8. L4-L5 bilateral pars articularis defect. 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 10. Further evaluation with axillary and inguinal ultrasound may be considered.   05/02/2023 Surgery   TESTICLE, LEFT, ORCHIECTOMY:  Seminoma, size 6.1 cm  Tumor limited to testis with rete testis and lymphovascular invasion  (pT2)  Germ cell neoplasia in situ is present  Spermatic cord and all margins of resection are negative for tumor     06/25/2023 Imaging   CT AP IMPRESSION: 1. Stable mild periportal, bilateral inguinal, bilateral external iliac  and bilateral common iliac lymphadenopathy. 2. No new or progressive metastatic disease in the abdomen or pelvis. 3. Solid peripheral  basilar 2.0 cm right lower lobe pulmonary nodule, new from 04/28/2023 CT, similar to recent 06/22/2023 chest CT, favoring evolving pulmonary infarct related to recent diagnosis of acute pulmonary embolism. Additional patchy consolidation at the posterior right lung base seen on prior chest CT has resolved. 4. Cholelithiasis. 5. Stable bilateral adrenal nodules characterized as benign adenomas on recent MRI, requiring no further imaging follow-up.   07/09/2023 -  Chemotherapy   Patient is on Treatment Plan : TESTICULAR BEP q21d x 3 Cycles         REVIEW OF SYSTEMS:   All relevant systems were reviewed with the patient and are negative.   VITALS:  There were no vitals taken for this visit.  Wt Readings from Last 3 Encounters:  09/17/23 278 lb 8 oz (126.3 kg)  08/23/23 267 lb 1.9 oz (121.2 kg)  08/02/23 250 lb 14.1 oz (113.8 kg)    There is no height or weight on file to calculate BMI.  Performance status (ECOG): 0 - Asymptomatic  PHYSICAL EXAM:   GENERAL:alert, no distress and comfortable SKIN: skin color normal, no rashes  EYES: normal, sclera clear OROPHARYNX: no exudate, no erythema    LUNGS: clear to auscultation with normal breathing effort.  No wheeze or rales HEART: regular rate & rhythm and no murmurs and no lower extremity edema ABDOMEN: abdomen soft, non-tender and nondistended Musculoskeletal: no edema  LABORATORY DATA:  I have reviewed the data as listed    Component Value Date/Time   NA 137 09/17/2023 0851   K 4.1 09/17/2023 0851   CL 106 09/17/2023 0851   CO2 27 09/17/2023 0851   GLUCOSE 88 09/17/2023 0851   BUN 14 09/17/2023 0851   CREATININE 1.01 09/17/2023 0851   CALCIUM 9.1 09/17/2023 0851   PROT 6.6 09/17/2023 0851   ALBUMIN 3.9 09/17/2023 0851   AST 15 09/17/2023 0851   ALT 19 09/17/2023 0851   ALKPHOS 45 09/17/2023 0851   BILITOT 0.4 09/17/2023 0851   GFRNONAA >60 09/17/2023 0851   GFRAA >90 06/16/2012 0048    No results found for:  "SPEP", "UPEP"  Lab Results  Component Value Date   WBC 4.7 09/17/2023   WBC 4.8 09/17/2023   NEUTROABS 1.9 09/17/2023   HGB 13.0 09/17/2023   HGB 13.0 09/17/2023   HCT 39.7 09/17/2023   HCT 39.5 09/17/2023   MCV 87.8 09/17/2023   MCV 87.8 09/17/2023   PLT 142 (L) 09/17/2023   PLT 146 (L) 09/17/2023  Chemistry      Component Value Date/Time   NA 137 09/17/2023 0851   K 4.1 09/17/2023 0851   CL 106 09/17/2023 0851   CO2 27 09/17/2023 0851   BUN 14 09/17/2023 0851   CREATININE 1.01 09/17/2023 0851      Component Value Date/Time   CALCIUM 9.1 09/17/2023 0851   ALKPHOS 45 09/17/2023 0851   AST 15 09/17/2023 0851   ALT 19 09/17/2023 0851   BILITOT 0.4 09/17/2023 0851       RADIOGRAPHIC STUDIES: I have personally reviewed the radiological images as listed and agreed with the findings in the report. No results found.

## 2023-09-14 NOTE — Assessment & Plan Note (Signed)
Continue apixaban 5 mg twice daily Follow-up for thrombocytopenia during week 2 and 3

## 2023-09-17 ENCOUNTER — Inpatient Hospital Stay: Payer: 59

## 2023-09-17 ENCOUNTER — Inpatient Hospital Stay (HOSPITAL_BASED_OUTPATIENT_CLINIC_OR_DEPARTMENT_OTHER): Payer: 59

## 2023-09-17 ENCOUNTER — Other Ambulatory Visit: Payer: Self-pay | Admitting: *Deleted

## 2023-09-17 VITALS — BP 103/63 | HR 81 | Temp 97.9°F | Resp 16 | Wt 278.5 lb

## 2023-09-17 DIAGNOSIS — C6292 Malignant neoplasm of left testis, unspecified whether descended or undescended: Secondary | ICD-10-CM

## 2023-09-17 DIAGNOSIS — I2609 Other pulmonary embolism with acute cor pulmonale: Secondary | ICD-10-CM

## 2023-09-17 DIAGNOSIS — Z5111 Encounter for antineoplastic chemotherapy: Secondary | ICD-10-CM | POA: Diagnosis not present

## 2023-09-17 DIAGNOSIS — B20 Human immunodeficiency virus [HIV] disease: Secondary | ICD-10-CM

## 2023-09-17 DIAGNOSIS — Z9189 Other specified personal risk factors, not elsewhere classified: Secondary | ICD-10-CM

## 2023-09-17 LAB — CBC WITH DIFFERENTIAL (CANCER CENTER ONLY)
Abs Immature Granulocytes: 0.01 10*3/uL (ref 0.00–0.07)
Basophils Absolute: 0 10*3/uL (ref 0.0–0.1)
Basophils Relative: 0 %
Eosinophils Absolute: 0.2 10*3/uL (ref 0.0–0.5)
Eosinophils Relative: 3 %
HCT: 39.5 % (ref 39.0–52.0)
Hemoglobin: 13 g/dL (ref 13.0–17.0)
Immature Granulocytes: 0 %
Lymphocytes Relative: 46 %
Lymphs Abs: 2.2 10*3/uL (ref 0.7–4.0)
MCH: 28.9 pg (ref 26.0–34.0)
MCHC: 32.9 g/dL (ref 30.0–36.0)
MCV: 87.8 fL (ref 80.0–100.0)
Monocytes Absolute: 0.5 10*3/uL (ref 0.1–1.0)
Monocytes Relative: 11 %
Neutro Abs: 1.9 10*3/uL (ref 1.7–7.7)
Neutrophils Relative %: 40 %
Platelet Count: 146 10*3/uL — ABNORMAL LOW (ref 150–400)
RBC: 4.5 MIL/uL (ref 4.22–5.81)
RDW: 16.7 % — ABNORMAL HIGH (ref 11.5–15.5)
WBC Count: 4.8 10*3/uL (ref 4.0–10.5)
nRBC: 0 % (ref 0.0–0.2)

## 2023-09-17 LAB — CMP (CANCER CENTER ONLY)
ALT: 19 U/L (ref 0–44)
AST: 15 U/L (ref 15–41)
Albumin: 3.9 g/dL (ref 3.5–5.0)
Alkaline Phosphatase: 45 U/L (ref 38–126)
Anion gap: 4 — ABNORMAL LOW (ref 5–15)
BUN: 14 mg/dL (ref 6–20)
CO2: 27 mmol/L (ref 22–32)
Calcium: 9.1 mg/dL (ref 8.9–10.3)
Chloride: 106 mmol/L (ref 98–111)
Creatinine: 1.01 mg/dL (ref 0.61–1.24)
GFR, Estimated: >60 mL/min (ref >60)
Glucose, Bld: 88 mg/dL (ref 70–99)
Potassium: 4.1 mmol/L (ref 3.5–5.1)
Sodium: 137 mmol/L (ref 135–145)
Total Bilirubin: 0.4 mg/dL (ref 0.0–1.2)
Total Protein: 6.6 g/dL (ref 6.5–8.1)

## 2023-09-17 LAB — CBC (CANCER CENTER ONLY)
HCT: 39.7 % (ref 39.0–52.0)
Hemoglobin: 13 g/dL (ref 13.0–17.0)
MCH: 28.8 pg (ref 26.0–34.0)
MCHC: 32.7 g/dL (ref 30.0–36.0)
MCV: 87.8 fL (ref 80.0–100.0)
Platelet Count: 142 10*3/uL — ABNORMAL LOW (ref 150–400)
RBC: 4.52 MIL/uL (ref 4.22–5.81)
RDW: 16.8 % — ABNORMAL HIGH (ref 11.5–15.5)
WBC Count: 4.7 10*3/uL (ref 4.0–10.5)
nRBC: 0 % (ref 0.0–0.2)

## 2023-09-17 LAB — TYPE AND SCREEN
ABO/RH(D): A POS
Antibody Screen: NEGATIVE

## 2023-09-17 LAB — ABO/RH: ABO/RH(D): A POS

## 2023-09-17 LAB — MAGNESIUM: Magnesium: 1.8 mg/dL (ref 1.7–2.4)

## 2023-09-17 LAB — LACTATE DEHYDROGENASE: LDH: 148 U/L (ref 98–192)

## 2023-09-17 MED ORDER — HEPARIN SOD (PORK) LOCK FLUSH 100 UNIT/ML IV SOLN
500.0000 [IU] | Freq: Once | INTRAVENOUS | Status: AC | PRN
  Administered 2023-09-17: 500 [IU]

## 2023-09-17 MED ORDER — HEPARIN SOD (PORK) LOCK FLUSH 100 UNIT/ML IV SOLN: 500.0000 [IU] | Freq: Once | INTRAVENOUS | Status: DC

## 2023-09-17 MED ORDER — MAGNESIUM SULFATE 2 GM/50ML IV SOLN
2.0000 g | Freq: Once | INTRAVENOUS | Status: AC
  Administered 2023-09-17: 2 g via INTRAVENOUS
  Filled 2023-09-17: qty 50

## 2023-09-17 MED ORDER — SODIUM CHLORIDE 0.9 % IV SOLN
20.0000 mg/m2 | Freq: Once | INTRAVENOUS | Status: AC
  Administered 2023-09-17: 50 mg via INTRAVENOUS
  Filled 2023-09-17: qty 50

## 2023-09-17 MED ORDER — SODIUM CHLORIDE 0.9% FLUSH
10.0000 mL | Freq: Once | INTRAVENOUS | Status: AC
  Administered 2023-09-17: 10 mL

## 2023-09-17 MED ORDER — SODIUM CHLORIDE 0.9 % IV SOLN
100.0000 mg/m2 | Freq: Once | INTRAVENOUS | Status: AC
  Administered 2023-09-17: 240 mg via INTRAVENOUS
  Filled 2023-09-17: qty 12

## 2023-09-17 MED ORDER — POTASSIUM CHLORIDE IN NACL 20-0.9 MEQ/L-% IV SOLN
Freq: Once | INTRAVENOUS | Status: AC
  Filled 2023-09-17: qty 1000

## 2023-09-17 MED ORDER — SODIUM CHLORIDE 0.9% FLUSH
10.0000 mL | INTRAVENOUS | Status: DC | PRN
Start: 2023-09-17 — End: 2023-09-17
  Administered 2023-09-17: 10 mL

## 2023-09-17 MED ORDER — SODIUM CHLORIDE 0.9 % IV SOLN
150.0000 mg | Freq: Once | INTRAVENOUS | Status: AC
  Administered 2023-09-17: 150 mg via INTRAVENOUS
  Filled 2023-09-17: qty 5
  Filled 2023-09-17: qty 150
  Filled 2023-09-17: qty 5

## 2023-09-17 MED ORDER — DEXAMETHASONE SODIUM PHOSPHATE 10 MG/ML IJ SOLN
10.0000 mg | Freq: Once | INTRAMUSCULAR | Status: AC
  Administered 2023-09-17: 10 mg via INTRAVENOUS
  Filled 2023-09-17: qty 1

## 2023-09-17 MED ORDER — PALONOSETRON HCL INJECTION 0.25 MG/5ML
0.2500 mg | Freq: Once | INTRAVENOUS | Status: AC
  Administered 2023-09-17: 0.25 mg via INTRAVENOUS
  Filled 2023-09-17: qty 5

## 2023-09-17 MED ORDER — SODIUM CHLORIDE 0.9 % IV SOLN: Freq: Once | INTRAVENOUS | Status: AC

## 2023-09-17 NOTE — Progress Notes (Signed)
Ok to proceed with fluids and Mag while ANC and Mg are pending per Dr. Cherly Hensen.

## 2023-09-17 NOTE — Progress Notes (Signed)
Per Cherly Hensen MD, ok to run post-fluids with Cisplatin today.

## 2023-09-17 NOTE — Patient Instructions (Signed)
[facility]  Discharge Instructions: Thank you for choosing [facility] to provide your oncology and hematology care.   If you have a lab appointment with the Cancer Center, please go directly to the Cancer Center and check in at the registration area.   Wear comfortable clothing and clothing appropriate for easy access to any Portacath or PICC line.   We strive to give you quality time with your provider. You may need to reschedule your appointment if you arrive late (15 or more minutes).  Arriving late affects you and other patients whose appointments are after yours.  Also, if you miss three or more appointments without notifying the office, you may be dismissed from the clinic at the provider's discretion.      For prescription refill requests, have your pharmacy contact our office and allow 72 hours for refills to be completed.    Today you received the following chemotherapy and/or immunotherapy agents: Cisplatin, Etoposide.       To help prevent nausea and vomiting after your treatment, we encourage you to take your nausea medication as directed.  BELOW ARE SYMPTOMS THAT SHOULD BE REPORTED IMMEDIATELY: *FEVER GREATER THAN 100.4 F (38 C) OR HIGHER *CHILLS OR SWEATING *NAUSEA AND VOMITING THAT IS NOT CONTROLLED WITH YOUR NAUSEA MEDICATION *UNUSUAL SHORTNESS OF BREATH *UNUSUAL BRUISING OR BLEEDING *URINARY PROBLEMS (pain or burning when urinating, or frequent urination) *BOWEL PROBLEMS (unusual diarrhea, constipation, pain near the anus) TENDERNESS IN MOUTH AND THROAT WITH OR WITHOUT PRESENCE OF ULCERS (sore throat, sores in mouth, or a toothache) UNUSUAL RASH, SWELLING OR PAIN  UNUSUAL VAGINAL DISCHARGE OR ITCHING   Items with * indicate a potential emergency and should be followed up as soon as possible or go to the Emergency Department if any problems should occur.  Please show the CHEMOTHERAPY ALERT CARD or  IMMUNOTHERAPY ALERT CARD at check-in to the Emergency Department and triage nurse.  Should you have questions after your visit or need to cancel or reschedule your appointment, please contact [facility] - [facility]  Dept: [phone]  and follow the prompts.  Office hours are 8:00 a.m. to 4:30 p.m. Monday - Friday. Please note that voicemails left after 4:00 p.m. may not be returned until the following business day.  We are closed weekends and major holidays. You have access to a nurse at all times for urgent questions. Please call the main number to the clinic Dept: [phone] and follow the prompts.   For any non-urgent questions, you may also contact your provider using MyChart. We now offer e-Visits for anyone 37 and older to request care online for non-urgent symptoms. For details visit [email or web address].   Also download the MyChart app! Go to the app store, search "MyChart", open the app, select [place], and log in with your MyChart username and password.

## 2023-09-17 NOTE — Progress Notes (Signed)
[facility] CSW Progress Note  Clinical Child psychotherapist  met with patient during infusion as requested by patient  to provide updates on disability referral. CSW sent referral to DAP on 12/09 via email. CSW attempted to reach DAP program via phone for update, unsuccessful. CSW sent email to program and provided patient with DAP program's direct number.   Marguerita Merles, LCSW Clinical Social Worker [facility]

## 2023-09-17 NOTE — Assessment & Plan Note (Signed)
Recent diagnosis beginning of November 2024 Continue Biktarvy

## 2023-09-18 ENCOUNTER — Inpatient Hospital Stay: Payer: 59

## 2023-09-18 ENCOUNTER — Ambulatory Visit: Payer: 59 | Admitting: Family

## 2023-09-18 VITALS — BP 123/64 | HR 98 | Temp 97.9°F | Resp 18

## 2023-09-18 DIAGNOSIS — C6292 Malignant neoplasm of left testis, unspecified whether descended or undescended: Secondary | ICD-10-CM

## 2023-09-18 DIAGNOSIS — Z5111 Encounter for antineoplastic chemotherapy: Secondary | ICD-10-CM | POA: Diagnosis not present

## 2023-09-18 LAB — AFP TUMOR MARKER: AFP, Serum, Tumor Marker: <1.8 ng/mL (ref 0.0–6.9)

## 2023-09-18 LAB — BETA HCG QUANT (REF LAB): hCG Quant: <1 m[IU]/mL (ref 0–3)

## 2023-09-18 MED ORDER — SODIUM CHLORIDE 0.9% FLUSH
10.0000 mL | INTRAVENOUS | Status: DC | PRN
Start: 2023-09-18 — End: 2023-09-18
  Administered 2023-09-18: 10 mL

## 2023-09-18 MED ORDER — SODIUM CHLORIDE 0.9 % IV SOLN
100.0000 mg/m2 | Freq: Once | INTRAVENOUS | Status: AC
  Administered 2023-09-18: 240 mg via INTRAVENOUS
  Filled 2023-09-18: qty 12

## 2023-09-18 MED ORDER — MAGNESIUM SULFATE 2 GM/50ML IV SOLN
2.0000 g | Freq: Once | INTRAVENOUS | Status: AC
  Administered 2023-09-18: 2 g via INTRAVENOUS
  Filled 2023-09-18: qty 50

## 2023-09-18 MED ORDER — SODIUM CHLORIDE 0.9 % IV SOLN
20.0000 mg/m2 | Freq: Once | INTRAVENOUS | Status: AC
  Administered 2023-09-18: 50 mg via INTRAVENOUS
  Filled 2023-09-18: qty 50

## 2023-09-18 MED ORDER — DEXAMETHASONE SODIUM PHOSPHATE 10 MG/ML IJ SOLN
10.0000 mg | Freq: Once | INTRAMUSCULAR | Status: AC
  Administered 2023-09-18: 10 mg via INTRAVENOUS
  Filled 2023-09-18: qty 1

## 2023-09-18 MED ORDER — SODIUM CHLORIDE 0.9 % IV SOLN
30.0000 [IU] | Freq: Once | INTRAVENOUS | Status: AC
  Administered 2023-09-18: 30 [IU] via INTRAVENOUS
  Filled 2023-09-18: qty 10

## 2023-09-18 MED ORDER — SODIUM CHLORIDE 0.9 % IV SOLN: Freq: Once | INTRAVENOUS | Status: AC

## 2023-09-18 MED ORDER — HEPARIN SOD (PORK) LOCK FLUSH 100 UNIT/ML IV SOLN
500.0000 [IU] | Freq: Once | INTRAVENOUS | Status: AC | PRN
  Administered 2023-09-18: 500 [IU]

## 2023-09-18 MED ORDER — POTASSIUM CHLORIDE IN NACL 20-0.9 MEQ/L-% IV SOLN
Freq: Once | INTRAVENOUS | Status: AC
  Filled 2023-09-18: qty 1000

## 2023-09-18 MED FILL — Fosaprepitant Dimeglumine For IV Infusion 150 MG (Base Eq): INTRAVENOUS | Qty: 5 | Status: AC

## 2023-09-19 ENCOUNTER — Inpatient Hospital Stay: Payer: 59

## 2023-09-19 VITALS — BP 133/75 | HR 90 | Temp 97.8°F

## 2023-09-19 DIAGNOSIS — C6292 Malignant neoplasm of left testis, unspecified whether descended or undescended: Secondary | ICD-10-CM

## 2023-09-19 DIAGNOSIS — Z5111 Encounter for antineoplastic chemotherapy: Secondary | ICD-10-CM | POA: Diagnosis not present

## 2023-09-19 MED ORDER — SODIUM CHLORIDE 0.9 % IV SOLN
100.0000 mg/m2 | Freq: Once | INTRAVENOUS | Status: AC
  Administered 2023-09-19: 240 mg via INTRAVENOUS
  Filled 2023-09-19: qty 12

## 2023-09-19 MED ORDER — POTASSIUM CHLORIDE IN NACL 20-0.9 MEQ/L-% IV SOLN
Freq: Once | INTRAVENOUS | Status: AC
  Filled 2023-09-19: qty 1000

## 2023-09-19 MED ORDER — SODIUM CHLORIDE 0.9 % IV SOLN
150.0000 mg | Freq: Once | INTRAVENOUS | Status: AC
  Administered 2023-09-19: 150 mg via INTRAVENOUS
  Filled 2023-09-19: qty 150

## 2023-09-19 MED ORDER — MAGNESIUM SULFATE 2 GM/50ML IV SOLN
2.0000 g | Freq: Once | INTRAVENOUS | Status: AC
  Administered 2023-09-19: 2 g via INTRAVENOUS
  Filled 2023-09-19: qty 50

## 2023-09-19 MED ORDER — SODIUM CHLORIDE 0.9 % IV SOLN: Freq: Once | INTRAVENOUS | Status: AC

## 2023-09-19 MED ORDER — SODIUM CHLORIDE 0.9 % IV SOLN
20.0000 mg/m2 | Freq: Once | INTRAVENOUS | Status: AC
  Administered 2023-09-19: 50 mg via INTRAVENOUS
  Filled 2023-09-19: qty 50

## 2023-09-19 MED ORDER — HEPARIN SOD (PORK) LOCK FLUSH 100 UNIT/ML IV SOLN
500.0000 [IU] | Freq: Once | INTRAVENOUS | Status: AC | PRN
  Administered 2023-09-19: 500 [IU]

## 2023-09-19 MED ORDER — SODIUM CHLORIDE 0.9% FLUSH
10.0000 mL | INTRAVENOUS | Status: DC | PRN
  Administered 2023-09-19: 10 mL

## 2023-09-19 MED ORDER — PALONOSETRON HCL INJECTION 0.25 MG/5ML
0.2500 mg | Freq: Once | INTRAVENOUS | Status: AC
Start: 2023-09-19 — End: 2023-09-19
  Administered 2023-09-19: 0.25 mg via INTRAVENOUS
  Filled 2023-09-19: qty 5

## 2023-09-19 MED ORDER — DEXAMETHASONE SODIUM PHOSPHATE 10 MG/ML IJ SOLN
10.0000 mg | Freq: Once | INTRAMUSCULAR | Status: AC
  Administered 2023-09-19: 10 mg via INTRAVENOUS
  Filled 2023-09-19: qty 1

## 2023-09-19 NOTE — Progress Notes (Signed)
[facility] CSW Progress Note  Clinical [facility] Referral .    Marguerita Merles, LCSW Clinical Social Worker [facility]

## 2023-09-19 NOTE — Patient Instructions (Signed)
[facility]  Discharge Instructions: Thank you for choosing [facility] to provide your oncology and hematology care.   If you have a lab appointment with the Cancer Center, please go directly to the Cancer Center and check in at the registration area.   Wear comfortable clothing and clothing appropriate for easy access to any Portacath or PICC line.   We strive to give you quality time with your provider. You may need to reschedule your appointment if you arrive late (15 or more minutes).  Arriving late affects you and other patients whose appointments are after yours.  Also, if you miss three or more appointments without notifying the office, you may be dismissed from the clinic at the provider's discretion.      For prescription refill requests, have your pharmacy contact our office and allow 72 hours for refills to be completed.    Today you received the following chemotherapy and/or immunotherapy agents :  Cisplatin & Etoposide      To help prevent nausea and vomiting after your treatment, we encourage you to take your nausea medication as directed.  BELOW ARE SYMPTOMS THAT SHOULD BE REPORTED IMMEDIATELY: *FEVER GREATER THAN 100.4 F (38 C) OR HIGHER *CHILLS OR SWEATING *NAUSEA AND VOMITING THAT IS NOT CONTROLLED WITH YOUR NAUSEA MEDICATION *UNUSUAL SHORTNESS OF BREATH *UNUSUAL BRUISING OR BLEEDING *URINARY PROBLEMS (pain or burning when urinating, or frequent urination) *BOWEL PROBLEMS (unusual diarrhea, constipation, pain near the anus) TENDERNESS IN MOUTH AND THROAT WITH OR WITHOUT PRESENCE OF ULCERS (sore throat, sores in mouth, or a toothache) UNUSUAL RASH, SWELLING OR PAIN  UNUSUAL VAGINAL DISCHARGE OR ITCHING   Items with * indicate a potential emergency and should be followed up as soon as possible or go to the Emergency Department if any problems should occur.  Please show the CHEMOTHERAPY ALERT CARD or  IMMUNOTHERAPY ALERT CARD at check-in to the Emergency Department and triage nurse.  Should you have questions after your visit or need to cancel or reschedule your appointment, please contact [facility] - [facility]  Dept: [phone]  and follow the prompts.  Office hours are 8:00 a.m. to 4:30 p.m. Monday - Friday. Please note that voicemails left after 4:00 p.m. may not be returned until the following business day.  We are closed weekends and major holidays. You have access to a nurse at all times for urgent questions. Please call the main number to the clinic Dept: [phone] and follow the prompts.   For any non-urgent questions, you may also contact your provider using MyChart. We now offer e-Visits for anyone 49 and older to request care online for non-urgent symptoms. For details visit [email or web address].   Also download the MyChart app! Go to the app store, search "MyChart", open the app, select [place], and log in with your MyChart username and password.

## 2023-09-20 ENCOUNTER — Inpatient Hospital Stay: Payer: 59

## 2023-09-20 ENCOUNTER — Telehealth: Payer: Self-pay

## 2023-09-20 VITALS — BP 150/89 | HR 91 | Temp 98.0°F | Resp 16

## 2023-09-20 DIAGNOSIS — Z5111 Encounter for antineoplastic chemotherapy: Secondary | ICD-10-CM | POA: Diagnosis not present

## 2023-09-20 DIAGNOSIS — C6292 Malignant neoplasm of left testis, unspecified whether descended or undescended: Secondary | ICD-10-CM

## 2023-09-20 MED ORDER — SODIUM CHLORIDE 0.9 % IV SOLN: Freq: Once | INTRAVENOUS | Status: AC

## 2023-09-20 MED ORDER — PROCHLORPERAZINE MALEATE 10 MG PO TABS
10.0000 mg | ORAL_TABLET | Freq: Once | ORAL | Status: AC
  Administered 2023-09-20: 10 mg via ORAL
  Filled 2023-09-20: qty 1

## 2023-09-20 MED ORDER — SODIUM CHLORIDE 0.9 % IV SOLN
100.0000 mg/m2 | Freq: Once | INTRAVENOUS | Status: AC
  Administered 2023-09-20: 240 mg via INTRAVENOUS
  Filled 2023-09-20: qty 12

## 2023-09-20 MED ORDER — MAGNESIUM SULFATE 2 GM/50ML IV SOLN
2.0000 g | Freq: Once | INTRAVENOUS | Status: AC
  Administered 2023-09-20: 2 g via INTRAVENOUS
  Filled 2023-09-20: qty 50

## 2023-09-20 MED ORDER — DEXAMETHASONE SODIUM PHOSPHATE 10 MG/ML IJ SOLN
10.0000 mg | Freq: Once | INTRAMUSCULAR | Status: AC
  Administered 2023-09-20: 10 mg via INTRAVENOUS
  Filled 2023-09-20: qty 1

## 2023-09-20 MED ORDER — POTASSIUM CHLORIDE IN NACL 20-0.9 MEQ/L-% IV SOLN
Freq: Once | INTRAVENOUS | Status: AC
  Filled 2023-09-20: qty 1000

## 2023-09-20 MED ORDER — SODIUM CHLORIDE 0.9 % IV SOLN
20.0000 mg/m2 | Freq: Once | INTRAVENOUS | Status: AC
  Administered 2023-09-20: 50 mg via INTRAVENOUS
  Filled 2023-09-20: qty 50

## 2023-09-20 MED FILL — Fosaprepitant Dimeglumine For IV Infusion 150 MG (Base Eq): INTRAVENOUS | Qty: 5 | Status: AC

## 2023-09-20 NOTE — Telephone Encounter (Signed)
Detectable Viral Load Intervention (DVL)  Most recent VL:  HIV 1 RNA Quant  Date Value Ref Range Status  06/28/2023 117,000 (H) copies/mL Final  06/23/2023 59,200 copies/mL Corrected    Comment:    (NOTE) The reportable range for this assay is 20 to 10,000,000 copies HIV-1 RNA/mL.     Last Clinic Visit: 06/28/23  Current ART regimen: Biktarvy  Appointment status: patient has future appointment scheduled  Medication last dispensed (per chart review):   Medication Adherence   What pharmacy do you use for your ART?   Do you pick up your medication at the pharmacy or is it mailed to you?   How often do you miss a dose your ART?   Are you experiencing any side effects with your ART?   Are you having any trouble remembering what medication(s) you are supposed to take or how you are supposed to take them?   What helps you remember to take your medication(s)?    Barriers to Care   Lack of transportation to medical appointments?  2. Housing instability?   3. If you are currently employed, are you having difficulty taking time off of work for medical appointments?   4. Financial concerns (rent, utilities, etc.)   5. Lack of consistent access to food?   6. Trouble remembering and attending your appointments?   7. Are you experiencing any other barriers that make it hard for you to come to appointments or take medication regularly?    Interventions   Called patient to discuss medication adherence and possible barriers to care. Not able to assess. Left vm requesting call back Juanita Laster, RMA

## 2023-09-20 NOTE — Patient Instructions (Signed)
[facility]  Discharge Instructions: Thank you for choosing [facility] to provide your oncology and hematology care.   If you have a lab appointment with the Cancer Center, please go directly to the Cancer Center and check in at the registration area.   Wear comfortable clothing and clothing appropriate for easy access to any Portacath or PICC line.   We strive to give you quality time with your provider. You may need to reschedule your appointment if you arrive late (15 or more minutes).  Arriving late affects you and other patients whose appointments are after yours.  Also, if you miss three or more appointments without notifying the office, you may be dismissed from the clinic at the provider's discretion.      For prescription refill requests, have your pharmacy contact our office and allow 72 hours for refills to be completed.    Today you received the following chemotherapy and/or immunotherapy agents :  Cisplatin & Etoposide      To help prevent nausea and vomiting after your treatment, we encourage you to take your nausea medication as directed.  BELOW ARE SYMPTOMS THAT SHOULD BE REPORTED IMMEDIATELY: *FEVER GREATER THAN 100.4 F (38 C) OR HIGHER *CHILLS OR SWEATING *NAUSEA AND VOMITING THAT IS NOT CONTROLLED WITH YOUR NAUSEA MEDICATION *UNUSUAL SHORTNESS OF BREATH *UNUSUAL BRUISING OR BLEEDING *URINARY PROBLEMS (pain or burning when urinating, or frequent urination) *BOWEL PROBLEMS (unusual diarrhea, constipation, pain near the anus) TENDERNESS IN MOUTH AND THROAT WITH OR WITHOUT PRESENCE OF ULCERS (sore throat, sores in mouth, or a toothache) UNUSUAL RASH, SWELLING OR PAIN  UNUSUAL VAGINAL DISCHARGE OR ITCHING   Items with * indicate a potential emergency and should be followed up as soon as possible or go to the Emergency Department if any problems should occur.  Please show the CHEMOTHERAPY ALERT CARD or  IMMUNOTHERAPY ALERT CARD at check-in to the Emergency Department and triage nurse.  Should you have questions after your visit or need to cancel or reschedule your appointment, please contact [facility] - [facility]  Dept: [phone]  and follow the prompts.  Office hours are 8:00 a.m. to 4:30 p.m. Monday - Friday. Please note that voicemails left after 4:00 p.m. may not be returned until the following business day.  We are closed weekends and major holidays. You have access to a nurse at all times for urgent questions. Please call the main number to the clinic Dept: [phone] and follow the prompts.   For any non-urgent questions, you may also contact your provider using MyChart. We now offer e-Visits for anyone 49 and older to request care online for non-urgent symptoms. For details visit [email or web address].   Also download the MyChart app! Go to the app store, search "MyChart", open the app, select [place], and log in with your MyChart username and password.

## 2023-09-21 ENCOUNTER — Other Ambulatory Visit (HOSPITAL_COMMUNITY): Payer: Self-pay

## 2023-09-21 ENCOUNTER — Inpatient Hospital Stay: Payer: 59

## 2023-09-21 VITALS — BP 142/77 | HR 80 | Resp 16

## 2023-09-21 DIAGNOSIS — Z5111 Encounter for antineoplastic chemotherapy: Secondary | ICD-10-CM | POA: Diagnosis not present

## 2023-09-21 DIAGNOSIS — C6292 Malignant neoplasm of left testis, unspecified whether descended or undescended: Secondary | ICD-10-CM

## 2023-09-21 MED ORDER — SODIUM CHLORIDE 0.9 % IV SOLN
150.0000 mg | Freq: Once | INTRAVENOUS | Status: AC
  Administered 2023-09-21: 150 mg via INTRAVENOUS
  Filled 2023-09-21: qty 150

## 2023-09-21 MED ORDER — MAGNESIUM SULFATE 2 GM/50ML IV SOLN
2.0000 g | Freq: Once | INTRAVENOUS | Status: AC
  Administered 2023-09-21: 2 g via INTRAVENOUS
  Filled 2023-09-21: qty 50

## 2023-09-21 MED ORDER — PALONOSETRON HCL INJECTION 0.25 MG/5ML
0.2500 mg | Freq: Once | INTRAVENOUS | Status: AC
  Administered 2023-09-21: 0.25 mg via INTRAVENOUS
  Filled 2023-09-21: qty 5

## 2023-09-21 MED ORDER — POTASSIUM CHLORIDE IN NACL 20-0.9 MEQ/L-% IV SOLN
Freq: Once | INTRAVENOUS | Status: AC
  Filled 2023-09-21: qty 1000

## 2023-09-21 MED ORDER — CISPLATIN CHEMO INJECTION 100MG/100ML
20.0000 mg/m2 | Freq: Once | INTRAVENOUS | Status: AC
  Administered 2023-09-21: 50 mg via INTRAVENOUS
  Filled 2023-09-21: qty 50

## 2023-09-21 MED ORDER — SODIUM CHLORIDE 0.9 % IV SOLN
100.0000 mg/m2 | Freq: Once | INTRAVENOUS | Status: AC
  Administered 2023-09-21: 240 mg via INTRAVENOUS
  Filled 2023-09-21: qty 12

## 2023-09-21 MED ORDER — DEXAMETHASONE SODIUM PHOSPHATE 10 MG/ML IJ SOLN
10.0000 mg | Freq: Once | INTRAMUSCULAR | Status: AC
  Administered 2023-09-21: 10 mg via INTRAVENOUS
  Filled 2023-09-21: qty 1

## 2023-09-21 MED ORDER — SODIUM CHLORIDE 0.9 % IV SOLN: Freq: Once | INTRAVENOUS | Status: AC

## 2023-09-21 NOTE — Patient Instructions (Signed)
 [facility]  Discharge Instructions: Thank you for choosing [facility] to provide your oncology and hematology care.   If you have a lab appointment with the Cancer Center, please go directly to the Cancer Center and check in at the registration area.   Wear comfortable clothing and clothing appropriate for easy access to any Portacath or PICC line.   We strive to give you quality time with your provider. You may need to reschedule your appointment if you arrive late (15 or more minutes).  Arriving late affects you and other patients whose appointments are after yours.  Also, if you miss three or more appointments without notifying the office, you may be dismissed from the clinic at the provider's discretion.      For prescription refill requests, have your pharmacy contact our office and allow 72 hours for refills to be completed.    Today you received the following chemotherapy and/or immunotherapy agents :  Cisplatin & Etoposide      To help prevent nausea and vomiting after your treatment, we encourage you to take your nausea medication as directed.  BELOW ARE SYMPTOMS THAT SHOULD BE REPORTED IMMEDIATELY: *FEVER GREATER THAN 100.4 F (38 C) OR HIGHER *CHILLS OR SWEATING *NAUSEA AND VOMITING THAT IS NOT CONTROLLED WITH YOUR NAUSEA MEDICATION *UNUSUAL SHORTNESS OF BREATH *UNUSUAL BRUISING OR BLEEDING *URINARY PROBLEMS (pain or burning when urinating, or frequent urination) *BOWEL PROBLEMS (unusual diarrhea, constipation, pain near the anus) TENDERNESS IN MOUTH AND THROAT WITH OR WITHOUT PRESENCE OF ULCERS (sore throat, sores in mouth, or a toothache) UNUSUAL RASH, SWELLING OR PAIN  UNUSUAL VAGINAL DISCHARGE OR ITCHING   Items with * indicate a potential emergency and should be followed up as soon as possible or go to the Emergency Department if any problems should occur.  Please show the CHEMOTHERAPY ALERT CARD or  IMMUNOTHERAPY ALERT CARD at check-in to the Emergency Department and triage nurse.  Should you have questions after your visit or need to cancel or reschedule your appointment, please contact [facility]  Dept: [phone]  and follow the prompts.  Office hours are 8:00 a.m. to 4:30 p.m. Monday - Friday. Please note that voicemails left after 4:00 p.m. may not be returned until the following business day.  We are closed weekends and major holidays. You have access to a nurse at all times for urgent questions. Please call the main number to the clinic Dept: [phone] and follow the prompts.   For any non-urgent questions, you may also contact your provider using MyChart. We now offer e-Visits for anyone 49 and older to request care online for non-urgent symptoms. For details visit [email or web address].   Also download the MyChart app! Go to the app store, search "MyChart", open the app, select [place], and log in with your MyChart username and password.

## 2023-09-24 ENCOUNTER — Ambulatory Visit (INDEPENDENT_AMBULATORY_CARE_PROVIDER_SITE_OTHER): Payer: 59 | Admitting: Internal Medicine

## 2023-09-24 ENCOUNTER — Encounter: Payer: Self-pay | Admitting: Internal Medicine

## 2023-09-24 ENCOUNTER — Other Ambulatory Visit: Payer: Self-pay

## 2023-09-24 ENCOUNTER — Other Ambulatory Visit (HOSPITAL_COMMUNITY)
Admission: RE | Admit: 2023-09-24 | Discharge: 2023-09-24 | Disposition: A | Discharge status: Home / Self Care | Payer: 59 | Source: Ambulatory Visit | Attending: Internal Medicine | Admitting: Internal Medicine

## 2023-09-24 VITALS — BP 109/75 | HR 89 | Temp 97.4°F | Ht 71.0 in | Wt 272.0 lb

## 2023-09-24 DIAGNOSIS — Z21 Asymptomatic human immunodeficiency virus [HIV] infection status: Secondary | ICD-10-CM

## 2023-09-24 DIAGNOSIS — B20 Human immunodeficiency virus [HIV] disease: Secondary | ICD-10-CM

## 2023-09-24 DIAGNOSIS — C6292 Malignant neoplasm of left testis, unspecified whether descended or undescended: Secondary | ICD-10-CM

## 2023-09-24 NOTE — Progress Notes (Unsigned)
[facility]     HPI: Steve Stewart is a 33 y.o. male presents for HIV management on Biktarvy. Recent dx of testicular Ca on BEP on cycle 2 followed by Dr. Verl Bangs heme/onc.    One missed dose of biktarvy. No issues with tolerance.     Date of diagnosis 06/2023, cd4 255 ART exposure Past OIs Risk factors: MSM  Partners in last 2months 0, in the last 12 months1.  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 Current Outpatient Medications on File Prior to Visit  Medication Sig Dispense Refill   acetaminophen (TYLENOL) 650 MG CR tablet Take 1,300 mg by mouth every 8 (eight) hours as needed for pain.     albuterol (VENTOLIN HFA) 108 (90 Base) MCG/ACT inhaler Inhale 1 puff into the lungs every 6 (six) hours as needed for wheezing or shortness of breath.     Apixaban Starter Pack, 10mg  and 5mg , (ELIQUIS DVT/PE STARTER PACK) Take as directed on package: start with two-5mg  tablets twice daily for 7 days. On day 8, switch to one-5mg  tablet twice daily. 74 each 0   bictegravir-emtricitabine-tenofovir AF (BIKTARVY) 50-200-25 MG TABS tablet Take 1 tablet by mouth daily. 30 tablet 2   docusate sodium (COLACE) 100 MG capsule Take 2 capsules (200 mg total) by mouth 2 (two) times daily. 20 capsule 0   famotidine (PEPCID) 20 MG tablet Take 1  tablet (20 mg total) by mouth 2 (two) times daily. 30 tablet 2   lidocaine (XYLOCAINE) 2 % solution Use as directed 15 mLs in the mouth or throat every 3 (three) hours as needed for mouth pain. Mix 1 to 1 ratio with Maalox to rinse for pain 100 mL 0   lidocaine-prilocaine (EMLA) cream Apply to affected area once 30 g 3   Melatonin 10 MG TABS Take 1 tablet by mouth at bedtime. 30 tablet 0   polyethylene glycol (MIRALAX / GLYCOLAX) 17 g packet Take 17 g by mouth daily. 14 each 0   prochlorperazine (COMPAZINE) 10 MG tablet Take 1 tablet (10 mg total) by mouth every 6 (six) hours as needed for nausea or vomiting. 30 tablet 1   traZODone (DESYREL) 50 MG tablet Take 1 tablet (50 mg total) by mouth at bedtime. 90 tablet 1   ALPRAZolam (XANAX) 0.25 MG tablet Take 1 tablet (0.25 mg total) by mouth at bedtime as needed for anxiety or sleep. (Patient not taking: Reported on 09/24/2023) 5 tablet 0   dexamethasone (DECADRON) 4 MG tablet Take 2 tablets (8 mg total) by mouth daily. Start the day after last cisplatin dose on day 6 of chemo x 3 days.Take with food. (Patient not taking: Reported on 09/24/2023) 30 tablet 1   oxyCODONE-acetaminophen (PERCOCET) 10-325 MG tablet Take 1 tablet by mouth every 4 (four) hours as needed  for pain. (Patient not taking: Reported on 09/24/2023) 30 tablet 0   No current facility-administered medications on file prior to visit.    No Known Allergies    Lab Results HIV 1 RNA Quant (copies/mL)  Date Value  06/28/2023 117,000 (H)  06/23/2023 59,200   CD4 T Cell Abs (/uL)  Date Value  06/28/2023 255 (L)  06/23/2023    CANCELED BY LAB, SPECIMEN DOES NOT MEET COLLECTION REQUIREMENTS   No results found for: "HIV1GENOSEQ" Lab Results  Component Value Date   WBC 4.7 09/17/2023   WBC 4.8 09/17/2023   HGB 13.0 09/17/2023   HGB 13.0 09/17/2023   HCT 39.7 09/17/2023   HCT 39.5 09/17/2023   MCV 87.8 09/17/2023   MCV 87.8 09/17/2023   PLT 142 (L) 09/17/2023   PLT 146 (L)  09/17/2023    Lab Results  Component Value Date   CREATININE 1.01 09/17/2023   BUN 14 09/17/2023   NA 137 09/17/2023   K 4.1 09/17/2023   CL 106 09/17/2023   CO2 27 09/17/2023   Lab Results  Component Value Date   ALT 19 09/17/2023   AST 15 09/17/2023   ALKPHOS 45 09/17/2023   BILITOT 0.4 09/17/2023    Lab Results  Component Value Date   CHOL 140 06/28/2023   TRIG 141 06/28/2023   HDL 34 (L) 06/28/2023   LDLCALC 82 06/28/2023   Lab Results  Component Value Date   HAV NON-REACTIVE 06/28/2023   Lab Results  Component Value Date   HEPBSAG NON-REACTIVE 06/28/2023   HEPBSAB NON-REACTIVE 06/28/2023   No results found for: "HCVAB" No results found for: "CHLAMYDIAWP", "N" No results found for: "GCPROBEAPT" No results found for: "QUANTGOLD"  Assessment/Plan #HIV Results            Assessment and Plan #hIV  -CD4255, Vl 117k , on 06/28/23. RPV  PRB!E138A  Labs today F?U in 2months  #testicular C a -BEP   #Vaccination COVID-declined Flu-declined Monkeypox PCV-declined Meningitis-declined HepA-non immune  06/28/23 HEpB non immune 06/28/23 Tdap 09/08/22 Shingles  #Health maintenance -Quantiferon negative 06/28/23 -RPR 06/28/23 NR -HCV NR 06/28/23 -GC-today urine. Pt declined oral and rectal swab -Lipid- , on 06/28/23 -Dysplasia screen F/M -Mammogram  -Colonoscopy    Danelle Earthly, MD [facility]

## 2023-09-24 NOTE — Progress Notes (Signed)
CBC and sample to blood bank ordered for 2/4

## 2023-09-25 ENCOUNTER — Other Ambulatory Visit: Payer: Self-pay

## 2023-09-25 ENCOUNTER — Telehealth: Payer: Self-pay

## 2023-09-25 ENCOUNTER — Inpatient Hospital Stay: Payer: Medicaid Other

## 2023-09-25 ENCOUNTER — Telehealth: Payer: Self-pay | Admitting: *Deleted

## 2023-09-25 DIAGNOSIS — C6292 Malignant neoplasm of left testis, unspecified whether descended or undescended: Secondary | ICD-10-CM

## 2023-09-25 LAB — T-HELPER CELLS (CD4) COUNT (NOT AT ARMC)
CD4 % Helper T Cell: 28 % — ABNORMAL LOW (ref 33–65)
CD4 T Cell Abs: 572 /uL (ref 400–1790)

## 2023-09-25 LAB — URINE CYTOLOGY ANCILLARY ONLY
Chlamydia: NEGATIVE
Comment: NEGATIVE
Comment: NORMAL
Neisseria Gonorrhea: NEGATIVE

## 2023-09-25 NOTE — Telephone Encounter (Signed)
 [employer]. He cancelled today's appts because he was feeling anxious.  Notified of message below   Dr Chang:Recommend CBC, type and screen on Thursday. Transfuse one unit of platelet if <50 K. Stop apixaban  if spontaneous bleeding or bruising and report to ED.

## 2023-09-25 NOTE — Telephone Encounter (Signed)
Patient did not show up for lab and appt. Recommend CBC, type and screen on Thursday.  Transfuse one unit of platelet if <50 K.  Stop apixaban if spontaneous bleeding or bruising and report to ED.

## 2023-09-26 LAB — HIV-1 RNA QUANT-NO REFLEX-BLD
HIV 1 RNA Quant: <20 {copies}/mL — ABNORMAL HIGH
HIV-1 RNA Quant, Log: <1.3 {Log_copies}/mL — ABNORMAL HIGH

## 2023-09-27 ENCOUNTER — Inpatient Hospital Stay: Payer: Medicaid Other

## 2023-09-27 ENCOUNTER — Other Ambulatory Visit: Payer: Self-pay

## 2023-09-27 DIAGNOSIS — C6292 Malignant neoplasm of left testis, unspecified whether descended or undescended: Secondary | ICD-10-CM

## 2023-09-27 DIAGNOSIS — Z5111 Encounter for antineoplastic chemotherapy: Secondary | ICD-10-CM | POA: Diagnosis present

## 2023-09-27 DIAGNOSIS — B20 Human immunodeficiency virus [HIV] disease: Secondary | ICD-10-CM | POA: Insufficient documentation

## 2023-09-27 DIAGNOSIS — I2699 Other pulmonary embolism without acute cor pulmonale: Secondary | ICD-10-CM | POA: Diagnosis not present

## 2023-09-27 DIAGNOSIS — D696 Thrombocytopenia, unspecified: Secondary | ICD-10-CM | POA: Diagnosis not present

## 2023-09-27 LAB — CBC WITH DIFFERENTIAL (CANCER CENTER ONLY)
Abs Immature Granulocytes: 0.01 10*3/uL (ref 0.00–0.07)
Basophils Absolute: 0 10*3/uL (ref 0.0–0.1)
Basophils Relative: 0 %
Eosinophils Absolute: 0 10*3/uL (ref 0.0–0.5)
Eosinophils Relative: 0 %
HCT: 37.8 % — ABNORMAL LOW (ref 39.0–52.0)
Hemoglobin: 12.4 g/dL — ABNORMAL LOW (ref 13.0–17.0)
Immature Granulocytes: 0 %
Lymphocytes Relative: 60 %
Lymphs Abs: 1.6 10*3/uL (ref 0.7–4.0)
MCH: 29.1 pg (ref 26.0–34.0)
MCHC: 32.8 g/dL (ref 30.0–36.0)
MCV: 88.7 fL (ref 80.0–100.0)
Monocytes Absolute: 0.1 10*3/uL (ref 0.1–1.0)
Monocytes Relative: 2 %
Neutro Abs: 1 10*3/uL — ABNORMAL LOW (ref 1.7–7.7)
Neutrophils Relative %: 38 %
Platelet Count: 51 10*3/uL — ABNORMAL LOW (ref 150–400)
RBC: 4.26 MIL/uL (ref 4.22–5.81)
RDW: 15.9 % — ABNORMAL HIGH (ref 11.5–15.5)
WBC Count: 2.7 10*3/uL — ABNORMAL LOW (ref 4.0–10.5)
nRBC: 0 % (ref 0.0–0.2)

## 2023-09-27 LAB — TYPE AND SCREEN
ABO/RH(D): A POS
Antibody Screen: NEGATIVE

## 2023-09-27 LAB — SAMPLE TO BLOOD BANK

## 2023-09-27 MED ORDER — HEPARIN SOD (PORK) LOCK FLUSH 100 UNIT/ML IV SOLN
500.0000 [IU] | Freq: Once | INTRAVENOUS | Status: AC
  Administered 2023-09-27: 500 [IU] via INTRAVENOUS

## 2023-09-27 MED ORDER — SODIUM CHLORIDE 0.9% IV SOLUTION
250.0000 mL | INTRAVENOUS | Status: DC
  Administered 2023-09-27: 100 mL via INTRAVENOUS

## 2023-09-27 MED ORDER — SODIUM CHLORIDE 0.9% FLUSH
10.0000 mL | Freq: Once | INTRAVENOUS | Status: AC
  Administered 2023-09-27: 10 mL

## 2023-09-27 MED ORDER — SODIUM CHLORIDE 0.9% FLUSH
10.0000 mL | Freq: Once | INTRAVENOUS | Status: AC
  Administered 2023-09-27: 10 mL via INTRAVENOUS

## 2023-09-27 NOTE — Patient Instructions (Signed)
 Platelet Transfusion A platelet transfusion is a procedure in which a person receives donated platelets through an IV. Platelets are parts of blood that stick together and form a clot to help the body stop bleeding after an injury. If you have too few platelets, your blood may have trouble clotting. This may cause you to bleed and bruise very easily. You may need a platelet transfusion if you have a condition that causes a low number of platelets (thrombocytopenia). A platelet transfusion may be used to stop or prevent excessive bleeding. Tell a health care provider about: Any reactions you have had during previous transfusions. Any allergies you have. All medicines you are taking, including vitamins, herbs, eye drops, creams, and over-the-counter medicines. Any bleeding problems you have. Any surgeries you have had. Any medical conditions you have. Whether you are pregnant or may be pregnant. What are the risks? Generally, this is a safe procedure. However, problems may occur, including: Fever. Infection. Allergic reaction to the donated (donor) platelets. Your body's disease-fighting system (immune system) attacking the donor platelets (hemolytic reaction). This is rare. A rare reaction that causes lung damage (transfusion-related acute lung injury). What happens before the procedure? Medicines Ask your health care provider about: Changing or stopping your regular medicines. This is especially important if you are taking diabetes medicines or blood thinners. Taking medicines such as aspirin and ibuprofen. These medicines can thin your blood. Do not take these medicines unless your health care provider tells you to take them. Taking over-the-counter medicines, vitamins, herbs, and supplements. General instructions You will have a blood test to determine your blood type. Your blood type determines what kind of platelets you will be given. Follow instructions from your health care provider  about eating or drinking restrictions. If you have had an allergic reaction to a transfusion in the past, you may be given medicine to help prevent a reaction. Your temperature, blood pressure, pulse, and breathing will be monitored. What happens during the procedure?  An IV will be inserted into one of your veins. For your safety, two health care providers will verify your identity along with the donor platelets about to be infused. A bag of donor platelets will be connected to your IV. The platelets will flow into your bloodstream. This usually takes 30-60 minutes. Your temperature, blood pressure, pulse, and breathing will be monitored during the transfusion. This helps detect early signs of any reaction. You will also be monitored for other symptoms that may indicate a reaction, including chills, hives, or itching. If you have signs of a reaction at any time, your transfusion will be stopped, and you may be given medicine to help manage the reaction. When your transfusion is complete, your IV will be removed. Pressure may be applied to the IV site for a few minutes to stop any bleeding. The IV site will be covered with a bandage (dressing). The procedure may vary among health care providers and hospitals. What can I expect after the procedure? Your blood pressure, temperature, pulse, and breathing will be monitored until you leave the hospital or clinic. You may have some bruising and soreness at your IV site. Follow these instructions at home: Medicines Take over-the-counter and prescription medicines only as told by your health care provider. Talk with your health care provider before you take any medicines that contain aspirin or NSAIDs, such as ibuprofen. These medicines increase your risk for dangerous bleeding. IV site care Check your IV site every day for signs of infection. Check for:  Redness, swelling, or pain. Fluid or blood. If fluid or blood drains from your IV site, use your  hands to press down firmly on a bandage covering the area for a minute or two. Doing this should stop the bleeding. Warmth. Pus or a bad smell. General instructions Change or remove your dressing as told by your health care provider. Return to your normal activities as told by your health care provider. Ask your health care provider what activities are safe for you. Do not take baths, swim, or use a hot tub until your health care provider approves. Ask your health care provider if you may take showers. Keep all follow-up visits. This is important. Contact a health care provider if: You have a headache that does not go away with medicine. You have hives, rash, or itchy skin. You have nausea or vomiting. You feel unusually tired or weak. You have signs of infection at your IV site. Get help right away if: You have a fever or chills. You urinate less often than usual. Your urine is darker colored than normal. You have any of the following: Trouble breathing. Pain in your back, abdomen, or chest. Cool, clammy skin. A fast heartbeat. Summary Platelets are tiny pieces of blood cells that clump together to form a blood clot when you have an injury. If you have too few platelets, your blood may have trouble clotting. A platelet transfusion is a procedure in which you receive donated platelets through an IV. A platelet transfusion may be used to stop or prevent excessive bleeding. After the procedure, check your IV site every day for signs of infection. This information is not intended to replace advice given to you by your health care provider. Make sure you discuss any questions you have with your health care provider. Document Revised: 03/08/2023 Document Reviewed: 02/10/2021 Elsevier Patient Education  2024 [employer].

## 2023-09-28 LAB — PREPARE PLATELET PHERESIS: Unit division: 0

## 2023-09-28 LAB — BPAM PLATELET PHERESIS
Blood Product Expiration Date: 202502062359
ISSUE DATE / TIME: 202502061101
Unit Type and Rh: 7300

## 2023-09-30 DIAGNOSIS — D696 Thrombocytopenia, unspecified: Secondary | ICD-10-CM | POA: Insufficient documentation

## 2023-09-30 NOTE — Assessment & Plan Note (Deleted)
 Continue apixaban 5 mg twice daily. Follow-up for thrombocytopenia during week 2 and 3 Transfuse platelet to keep >50K

## 2023-09-30 NOTE — Assessment & Plan Note (Deleted)
 Keep platelet >50K on apixaban

## 2023-09-30 NOTE — Assessment & Plan Note (Deleted)
 Will proceed with cycle 2 day 9 this week Will monitor pancytopenia closely

## 2023-09-30 NOTE — Progress Notes (Deleted)
 Patient Care Team: Patient, [name] - General (General Practice)  Clinic Day:  09/30/2023  Referring physician: [name], MD  ASSESSMENT & PLAN:   Assessment & Plan: No problem-specific Assessment & Plan notes found for this encounter.    The patient understands the plans discussed today and is in agreement with them.  He knows to contact our office if he develops concerns prior to his next appointment.  [name], MD  [facility] DE[facility] [address] [place] [address] Dept: [phone] Dept Fax: [phone]   No orders of the defined types were placed in this encounter.     CHIEF COMPLAINT:  CC: ***  Current Treatment:  ***  INTERVAL HISTORY:  Ricard is here today for repeat clinical assessment. He denies fevers or chills. He denies pain. His appetite is good. His weight {Weight change:10426}.  I have reviewed the past medical history, past surgical history, social history and family history with the patient and they are unchanged from previous note.  ALLERGIES:  has no known allergies.  MEDICATIONS:  Current Outpatient Medications  Medication Sig Dispense Refill   acetaminophen (TYLENOL) 650 MG CR tablet Take 1,300 mg by mouth every 8 (eight) hours as needed for pain.     albuterol (VENTOLIN HFA) 108 (90 Base) MCG/ACT inhaler Inhale 1 puff into the lungs every 6 (six) hours as needed for wheezing or shortness of breath.     ALPRAZolam (XANAX) 0.25 MG tablet Take 1 tablet (0.25 mg total) by mouth at bedtime as needed for anxiety or sleep. (Patient not taking: Reported on 09/24/2023) 5 tablet 0   Apixaban Starter Pack, 10mg  and 5mg , (ELIQUIS DVT/PE STARTER PACK) Take as directed on package: start with two-5mg  tablets twice daily for 7 days. On day 8, switch to one-5mg  tablet twice daily. 74 each 0   bictegravir-emtricitabine-tenofovir AF (BIKTARVY) 50-200-25 MG  TABS tablet Take 1 tablet by mouth daily. 30 tablet 2   dexamethasone (DECADRON) 4 MG tablet Take 2 tablets (8 mg total) by mouth daily. Start the day after last cisplatin dose on day 6 of chemo x 3 days.Take with food. (Patient not taking: Reported on 09/24/2023) 30 tablet 1   docusate sodium (COLACE) 100 MG capsule Take 2 capsules (200 mg total) by mouth 2 (two) times daily. 20 capsule 0   famotidine (PEPCID) 20 MG tablet Take 1 tablet (20 mg total) by mouth 2 (two) times daily. 30 tablet 2   lidocaine (XYLOCAINE) 2 % solution Use as directed 15 mLs in the mouth or throat every 3 (three) hours as needed for mouth pain. Mix 1 to 1 ratio with Maalox to rinse for pain 100 mL 0   lidocaine-prilocaine (EMLA) cream Apply to affected area once 30 g 3   Melatonin 10 MG TABS Take 1 tablet by mouth at bedtime. 30 tablet 0   oxyCODONE-acetaminophen (PERCOCET) 10-325 MG tablet Take 1 tablet by mouth every 4 (four) hours as needed for pain. (Patient not taking: Reported on 09/24/2023) 30 tablet 0   polyethylene glycol (MIRALAX / GLYCOLAX) 17 g packet Take 17 g by mouth daily. 14 each 0   prochlorperazine (COMPAZINE) 10 MG tablet Take 1 tablet (10 mg total) by mouth every 6 (six) hours as needed for nausea or vomiting. 30 tablet 1   traZODone (DESYREL) 50 MG tablet Take 1 tablet (50 mg total) by  mouth at bedtime. 90 tablet 1   No current facility-administered medications for this visit.    HISTORY OF PRESENT ILLNESS:   Oncology History  Primary seminoma of left testis (HCC)  04/28/2023 Initial Diagnosis   Primary seminoma of left testis [facility]) Presented to ED for worsening testicular pain and presented with 2 months history of testicular mass.   04/28/2023 Imaging   [place] 1. Positive for abnormally enlarged and heterogeneous but vascular Left Testis highly suspicious for Testicular Neoplasm. Recommend Urology consultation. 2. Negative for testicular torsion.  Right testis appears normal.   04/28/2023 Imaging    CT CAP 1. Partially visualized thickening of the skin of the left scrotum. 2. Prominent bilateral axillary lymph nodes, right greater than left. Suspect at least 2 lymph nodes with abnormal cortical thickening in the right axilla. 3. Shotty retroperitoneal, bilateral iliac chain lymph nodes measuring up to 1.1 cm in short axis. 4. Bilateral inguinal lymph nodes with borderline thickened left inguinal lymph nodes measuring up to 1.1 cm in short axis. 5. 1.2 cm right adrenal mass, indeterminate. Attention on follow-up is recommended. Alternatively evaluation with MRI may be considered. 6. Hazy attenuation within the anterior mediastinum without defined mass may represent residual thymus. Attention on follow-up is recommended. 7. Cholelithiasis. 8. L4-L5 bilateral pars articularis defect. 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 10. Further evaluation with axillary and inguinal ultrasound may be considered.   05/02/2023 Surgery   TESTICLE, LEFT, ORCHIECTOMY:  Seminoma, size 6.1 cm  Tumor limited to testis with rete testis and lymphovascular invasion  (pT2)  Germ cell neoplasia in situ is present  Spermatic cord and all margins of resection are negative for tumor     06/25/2023 Imaging   CT AP IMPRESSION: 1. Stable mild periportal, bilateral inguinal, bilateral external iliac  and bilateral common iliac lymphadenopathy. 2. No new or progressive metastatic disease in the abdomen or pelvis. 3. Solid peripheral basilar 2.0 cm right lower lobe pulmonary nodule, new from 04/28/2023 CT, similar to recent 06/22/2023 chest CT, favoring evolving pulmonary infarct related to recent diagnosis of acute pulmonary embolism. Additional patchy consolidation at the posterior right lung base seen on prior chest CT has resolved. 4. Cholelithiasis. 5. Stable bilateral adrenal nodules characterized as benign adenomas on recent MRI, requiring no further imaging follow-up.    07/09/2023 -  Chemotherapy   Patient is on Treatment Plan : TESTICULAR BEP q21d x 3 Cycles         REVIEW OF SYSTEMS:   All relevant systems were reviewed with the patient and are negative.   VITALS:  There were no vitals taken for this visit.  Wt Readings from Last 3 Encounters:  09/27/23 278 lb 3 oz (126.2 kg)  09/24/23 272 lb (123.4 kg)  09/17/23 278 lb 8 oz (126.3 kg)    There is no height or weight on file to calculate BMI.  Performance status (ECOG): {CHL ONC [id number]  PHYSICAL EXAM:   GENERAL:alert, no distress and comfortable SKIN: skin color normal, no rashes  EYES: normal, sclera clear OROPHARYNX: no exudate, no erythema    NECK: supple,  non-tender, without nodularity LYMPH:  no palpable cervical lymphadenopathy LUNGS: clear to auscultation with normal breathing effort.  No wheeze or rales HEART: regular rate & rhythm and no murmurs and no lower extremity edema ABDOMEN: abdomen soft, non-tender and nondistended Musculoskeletal: no edema NEURO: alert, fluent speech, no focal motor/sensory deficits.  Strength and sensation equal bilaterally.  LABORATORY DATA:  I have reviewed the data as listed    Component Value Date/Time   NA 137 09/17/2023 0851   K 4.1 09/17/2023 0851   CL 106 09/17/2023 0851   CO2 27 09/17/2023 0851   GLUCOSE 88 09/17/2023 0851   BUN 14 09/17/2023 0851   CREATININE 1.01 09/17/2023 0851   CALCIUM 9.1 09/17/2023 0851   PROT 6.6 09/17/2023 0851   ALBUMIN 3.9 09/17/2023 0851   AST 15 09/17/2023 0851   ALT 19 09/17/2023 0851   ALKPHOS 45 09/17/2023 0851   BILITOT 0.4 09/17/2023 0851   GFRNONAA >60 09/17/2023 0851   GFRAA >90 06/16/2012 0048    No results found for: "SPEP", "UPEP"  Lab Results  Component Value Date   WBC 2.7 (L) 09/27/2023   NEUTROABS 1.0 (L) 09/27/2023   HGB 12.4 (L) 09/27/2023   HCT 37.8 (L) 09/27/2023   MCV 88.7 09/27/2023   PLT 51 (L) 09/27/2023      Chemistry      Component Value Date/Time    NA 137 09/17/2023 0851   K 4.1 09/17/2023 0851   CL 106 09/17/2023 0851   CO2 27 09/17/2023 0851   BUN 14 09/17/2023 0851   CREATININE 1.01 09/17/2023 0851      Component Value Date/Time   CALCIUM 9.1 09/17/2023 0851   ALKPHOS 45 09/17/2023 0851   AST 15 09/17/2023 0851   ALT 19 09/17/2023 0851   BILITOT 0.4 09/17/2023 0851       RADIOGRAPHIC STUDIES: I have personally reviewed the radiological images as listed and agreed with the findings in the report. No results found.

## 2023-10-01 ENCOUNTER — Inpatient Hospital Stay: Payer: 59

## 2023-10-01 ENCOUNTER — Inpatient Hospital Stay: Payer: Medicaid Other

## 2023-10-01 ENCOUNTER — Other Ambulatory Visit: Payer: Self-pay

## 2023-10-01 DIAGNOSIS — D696 Thrombocytopenia, unspecified: Secondary | ICD-10-CM

## 2023-10-01 DIAGNOSIS — Z5111 Encounter for antineoplastic chemotherapy: Secondary | ICD-10-CM | POA: Diagnosis not present

## 2023-10-01 DIAGNOSIS — C6292 Malignant neoplasm of left testis, unspecified whether descended or undescended: Secondary | ICD-10-CM

## 2023-10-01 LAB — CBC (CANCER CENTER ONLY)
HCT: 37.3 % — ABNORMAL LOW (ref 39.0–52.0)
Hemoglobin: 12.4 g/dL — ABNORMAL LOW (ref 13.0–17.0)
MCH: 29.5 pg (ref 26.0–34.0)
MCHC: 33.2 g/dL (ref 30.0–36.0)
MCV: 88.6 fL (ref 80.0–100.0)
Platelet Count: 47 10*3/uL — ABNORMAL LOW (ref 150–400)
RBC: 4.21 MIL/uL — ABNORMAL LOW (ref 4.22–5.81)
RDW: 15.8 % — ABNORMAL HIGH (ref 11.5–15.5)
WBC Count: 1.5 10*3/uL — ABNORMAL LOW (ref 4.0–10.5)
nRBC: 0 % (ref 0.0–0.2)

## 2023-10-01 LAB — CMP (CANCER CENTER ONLY)
ALT: 19 U/L (ref 0–44)
AST: 10 U/L — ABNORMAL LOW (ref 15–41)
Albumin: 4 g/dL (ref 3.5–5.0)
Alkaline Phosphatase: 51 U/L (ref 38–126)
Anion gap: 5 (ref 5–15)
BUN: 24 mg/dL — ABNORMAL HIGH (ref 6–20)
CO2: 26 mmol/L (ref 22–32)
Calcium: 9.2 mg/dL (ref 8.9–10.3)
Chloride: 106 mmol/L (ref 98–111)
Creatinine: 0.99 mg/dL (ref 0.61–1.24)
GFR, Estimated: >60 mL/min (ref >60)
Glucose, Bld: 76 mg/dL (ref 70–99)
Potassium: 4.7 mmol/L (ref 3.5–5.1)
Sodium: 137 mmol/L (ref 135–145)
Total Bilirubin: 0.2 mg/dL (ref 0.0–1.2)
Total Protein: 6.5 g/dL (ref 6.5–8.1)

## 2023-10-01 LAB — SAMPLE TO BLOOD BANK

## 2023-10-01 MED ORDER — SODIUM CHLORIDE 0.9% FLUSH
10.0000 mL | Freq: Once | INTRAVENOUS | Status: AC
  Administered 2023-10-01: 10 mL

## 2023-10-01 MED ORDER — SODIUM CHLORIDE 0.9% FLUSH
10.0000 mL | INTRAVENOUS | Status: AC | PRN
  Administered 2023-10-01: 10 mL

## 2023-10-01 MED ORDER — SODIUM CHLORIDE 0.9% IV SOLUTION
250.0000 mL | INTRAVENOUS | Status: DC
  Administered 2023-10-01: 100 mL via INTRAVENOUS

## 2023-10-01 MED ORDER — HEPARIN SOD (PORK) LOCK FLUSH 100 UNIT/ML IV SOLN
250.0000 [IU] | INTRAVENOUS | Status: AC | PRN
  Administered 2023-10-01: 500 [IU]

## 2023-10-01 NOTE — Patient Instructions (Signed)
 Platelet Transfusion A platelet transfusion is a procedure in which a person receives donated platelets through an IV. Platelets are parts of blood that stick together and form a clot to help the body stop bleeding after an injury. If you have too few platelets, your blood may have trouble clotting. This may cause you to bleed and bruise very easily. You may need a platelet transfusion if you have a condition that causes a low number of platelets (thrombocytopenia). A platelet transfusion may be used to stop or prevent excessive bleeding. Tell a health care provider about: Any reactions you have had during previous transfusions. Any allergies you have. All medicines you are taking, including vitamins, herbs, eye drops, creams, and over-the-counter medicines. Any bleeding problems you have. Any surgeries you have had. Any medical conditions you have. Whether you are pregnant or may be pregnant. What are the risks? Generally, this is a safe procedure. However, problems may occur, including: Fever. Infection. Allergic reaction to the donated (donor) platelets. Your body's disease-fighting system (immune system) attacking the donor platelets (hemolytic reaction). This is rare. A rare reaction that causes lung damage (transfusion-related acute lung injury). What happens before the procedure? Medicines Ask your health care provider about: Changing or stopping your regular medicines. This is especially important if you are taking diabetes medicines or blood thinners. Taking medicines such as aspirin and ibuprofen. These medicines can thin your blood. Do not take these medicines unless your health care provider tells you to take them. Taking over-the-counter medicines, vitamins, herbs, and supplements. General instructions You will have a blood test to determine your blood type. Your blood type determines what kind of platelets you will be given. Follow instructions from your health care provider  about eating or drinking restrictions. If you have had an allergic reaction to a transfusion in the past, you may be given medicine to help prevent a reaction. Your temperature, blood pressure, pulse, and breathing will be monitored. What happens during the procedure?  An IV will be inserted into one of your veins. For your safety, two health care providers will verify your identity along with the donor platelets about to be infused. A bag of donor platelets will be connected to your IV. The platelets will flow into your bloodstream. This usually takes 30-60 minutes. Your temperature, blood pressure, pulse, and breathing will be monitored during the transfusion. This helps detect early signs of any reaction. You will also be monitored for other symptoms that may indicate a reaction, including chills, hives, or itching. If you have signs of a reaction at any time, your transfusion will be stopped, and you may be given medicine to help manage the reaction. When your transfusion is complete, your IV will be removed. Pressure may be applied to the IV site for a few minutes to stop any bleeding. The IV site will be covered with a bandage (dressing). The procedure may vary among health care providers and hospitals. What can I expect after the procedure? Your blood pressure, temperature, pulse, and breathing will be monitored until you leave the hospital or clinic. You may have some bruising and soreness at your IV site. Follow these instructions at home: Medicines Take over-the-counter and prescription medicines only as told by your health care provider. Talk with your health care provider before you take any medicines that contain aspirin or NSAIDs, such as ibuprofen. These medicines increase your risk for dangerous bleeding. IV site care Check your IV site every day for signs of infection. Check for:  Redness, swelling, or pain. Fluid or blood. If fluid or blood drains from your IV site, use your  hands to press down firmly on a bandage covering the area for a minute or two. Doing this should stop the bleeding. Warmth. Pus or a bad smell. General instructions Change or remove your dressing as told by your health care provider. Return to your normal activities as told by your health care provider. Ask your health care provider what activities are safe for you. Do not take baths, swim, or use a hot tub until your health care provider approves. Ask your health care provider if you may take showers. Keep all follow-up visits. This is important. Contact a health care provider if: You have a headache that does not go away with medicine. You have hives, rash, or itchy skin. You have nausea or vomiting. You feel unusually tired or weak. You have signs of infection at your IV site. Get help right away if: You have a fever or chills. You urinate less often than usual. Your urine is darker colored than normal. You have any of the following: Trouble breathing. Pain in your back, abdomen, or chest. Cool, clammy skin. A fast heartbeat. Summary Platelets are tiny pieces of blood cells that clump together to form a blood clot when you have an injury. If you have too few platelets, your blood may have trouble clotting. A platelet transfusion is a procedure in which you receive donated platelets through an IV. A platelet transfusion may be used to stop or prevent excessive bleeding. After the procedure, check your IV site every day for signs of infection. This information is not intended to replace advice given to you by your health care provider. Make sure you discuss any questions you have with your health care provider. Document Revised: 03/08/2023 Document Reviewed: 02/10/2021 Elsevier Patient Education  2024 [employer].

## 2023-10-02 ENCOUNTER — Inpatient Hospital Stay: Payer: Medicaid Other

## 2023-10-02 ENCOUNTER — Other Ambulatory Visit: Payer: Self-pay

## 2023-10-02 ENCOUNTER — Telehealth: Payer: Self-pay

## 2023-10-02 ENCOUNTER — Telehealth: Payer: Self-pay | Admitting: *Deleted

## 2023-10-02 ENCOUNTER — Other Ambulatory Visit: Payer: 59

## 2023-10-02 ENCOUNTER — Inpatient Hospital Stay: Payer: 59

## 2023-10-02 DIAGNOSIS — C6292 Malignant neoplasm of left testis, unspecified whether descended or undescended: Secondary | ICD-10-CM

## 2023-10-02 DIAGNOSIS — D696 Thrombocytopenia, unspecified: Secondary | ICD-10-CM

## 2023-10-02 DIAGNOSIS — I2609 Other pulmonary embolism with acute cor pulmonale: Secondary | ICD-10-CM

## 2023-10-02 LAB — PREPARE PLATELET PHERESIS: Unit division: 0

## 2023-10-02 LAB — BPAM PLATELET PHERESIS
Blood Product Expiration Date: 202502122359
ISSUE DATE / TIME: 202502101016
Unit Type and Rh: 6200

## 2023-10-02 NOTE — Progress Notes (Signed)
Patient did not show for appointment and infusion again today. Nurse Tammi called and infusion was able to fit him in for Wed.  Ok to treatment without new labs on Wed 2/12.   Patient is scheduled to come back on 2/14 for lab (CBC, type and Screen) and transfusion if platelet 50K or less. Standing CBC and sample to blood bank orders in place.  If platelet 50K or less, will need one unit of platelet while on anticoagulation.

## 2023-10-02 NOTE — Telephone Encounter (Signed)
Called Steve Stewart to find out if [facility]. He states he didn't want to come out today because it was cold. Wants to reschedule to tomorrow. MD notified.

## 2023-10-03 ENCOUNTER — Inpatient Hospital Stay: Payer: Medicaid Other

## 2023-10-03 ENCOUNTER — Inpatient Hospital Stay: Payer: 59

## 2023-10-03 VITALS — BP 123/79 | HR 82 | Temp 98.0°F | Resp 20

## 2023-10-03 DIAGNOSIS — Z5111 Encounter for antineoplastic chemotherapy: Secondary | ICD-10-CM | POA: Diagnosis not present

## 2023-10-03 DIAGNOSIS — C6292 Malignant neoplasm of left testis, unspecified whether descended or undescended: Secondary | ICD-10-CM

## 2023-10-03 MED ORDER — SODIUM CHLORIDE 0.9% FLUSH
10.0000 mL | INTRAVENOUS | Status: DC | PRN
  Administered 2023-10-03: 10 mL

## 2023-10-03 MED ORDER — HEPARIN SOD (PORK) LOCK FLUSH 100 UNIT/ML IV SOLN
500.0000 [IU] | Freq: Once | INTRAVENOUS | Status: AC | PRN
  Administered 2023-10-03: 500 [IU]

## 2023-10-03 MED ORDER — PROCHLORPERAZINE MALEATE 10 MG PO TABS
10.0000 mg | ORAL_TABLET | Freq: Once | ORAL | Status: AC
Start: 2023-10-03 — End: 2023-10-03
  Administered 2023-10-03: 10 mg via ORAL
  Filled 2023-10-03: qty 1

## 2023-10-03 MED ORDER — SODIUM CHLORIDE 0.9 % IV SOLN: Freq: Once | INTRAVENOUS | Status: AC

## 2023-10-03 MED ORDER — SODIUM CHLORIDE 0.9 % IV SOLN
30.0000 [IU] | Freq: Once | INTRAVENOUS | Status: AC
  Administered 2023-10-03: 30 [IU] via INTRAVENOUS
  Filled 2023-10-03: qty 10

## 2023-10-03 NOTE — Patient Instructions (Signed)
Bleomycin Injection What is this medication? BLEOMYCIN (blee oh MYE sin) treats some types of cancer. It works by slowing down the growth of cancer cells. It may also be used to prevent and treat the buildup of fluid around the lungs that can be caused by cancer. It works by helping your body create less space around your lungs, so that fluid cannot build up. This medicine may be used for other purposes; ask your health care provider or pharmacist if you have questions. COMMON BRAND NAME(S): Blenoxane What should I tell my care team before I take this medication? They need to know if you have any of these conditions: Kidney disease Lung disease Recent or ongoing radiation therapy Tobacco use An unusual or allergic reaction to bleomycin, other medications, foods, dyes, or preservatives Pregnant or trying to get pregnant Breastfeeding How should I use this medication? This medication is injected into a vein or a body cavity. It can also be injected into a muscle or under the skin. It is given by your care team in a hospital or clinic setting. Talk to your care team about the use of this medication in children. Special care may be needed. Overdosage: If you think you have taken too much of this medicine contact a poison control center or emergency room at once. NOTE: This medicine is only for you. Do not share this medicine with others. What if I miss a dose? Keep appointments for follow-up doses. It is important not to miss your dose. Call your care team if you are unable to keep an appointment. What may interact with this medication? Do not take this medication with any of the following: Brentuximab vedotin This medication may also interact with the following: Certain antibiotics Cisplatin Cyclosporine Diuretics Foscarnet Medications to increase blood counts, such as filgrastim, pegfilgrastim, sargramostim Vaccines This list may not describe all possible interactions. Give your health  care provider a list of all the medicines, herbs, non-prescription drugs, or dietary supplements you use. Also tell them if you smoke, drink alcohol, or use illegal drugs. Some items may interact with your medicine. What should I watch for while using this medication? Your condition will be monitored carefully while you are receiving this medication. This medication may make you feel generally unwell. This is not uncommon as chemotherapy can affect healthy cells as well as cancer cells. Report any side effects. Continue your course of treatment even though you feel ill unless your care team tells you to stop. This medication may increase your risk of getting an infection. Call your care team for advice if you get a fever, chills, sore throat, or other symptoms of a cold or flu. Do not treat yourself. Try to avoid being around people who are sick. Avoid taking medications that contain aspirin, acetaminophen, ibuprofen, naproxen, or ketoprofen unless instructed by your care team. These medications may hide a fever. Talk to your care team if you may be pregnant. Serious birth defects can occur if you take this medication during pregnancy. Contraception is recommended while taking this medication. Your care team can help you find the option that works for you. Do not breastfeed while taking this medication. There is a maximum amount of this medication you should receive throughout your life. The amount depends on the medical condition being treated and your overall health. Your care team will watch how much of this medication you receive. Tell your care team if you have taken this medication before. What side effects may I notice from  receiving this medication? Side effects that you should report to your care team as soon as possible: Allergic reactions--skin rash, itching, hives, swelling of the face, lips, tongue, or throat Dry cough, shortness of breath or trouble breathing Infusion reactions--chest  pain, shortness of breath or trouble breathing, feeling faint or lightheaded Kidney injury--decrease in the amount of urine, swelling of the ankles, hands, or feet Liver injury--right upper belly pain, loss of appetite, nausea, light-colored stool, dark yellow or brown urine, yellowing skin or eyes, unusual weakness or fatigue Side effects that usually do not require medical attention (report to your care team if they continue or are bothersome): Change in nail shape, thickness, or color Change in skin color Hair loss Loss of appetite with weight loss Pain, redness, or irritation at injection site Vomiting This list may not describe all possible side effects. Call your doctor for medical advice about side effects. You may report side effects to FDA at 1-800-FDA-1088. Where should I keep my medication? This medication is given in a hospital or clinic. It will not be stored at home. NOTE: This sheet is a summary. It may not cover all possible information. If you have questions about this medicine, talk to your doctor, pharmacist, or health care provider.  2024 Elsevier/Gold Standard (2022-01-02 00:00:00)

## 2023-10-05 ENCOUNTER — Inpatient Hospital Stay: Payer: Medicaid Other

## 2023-10-05 ENCOUNTER — Telehealth: Payer: Self-pay

## 2023-10-05 ENCOUNTER — Other Ambulatory Visit: Payer: Self-pay

## 2023-10-05 VITALS — BP 130/83 | HR 95 | Temp 97.9°F | Resp 16 | Wt 279.0 lb

## 2023-10-05 DIAGNOSIS — C6292 Malignant neoplasm of left testis, unspecified whether descended or undescended: Secondary | ICD-10-CM

## 2023-10-05 DIAGNOSIS — D696 Thrombocytopenia, unspecified: Secondary | ICD-10-CM

## 2023-10-05 DIAGNOSIS — Z5111 Encounter for antineoplastic chemotherapy: Secondary | ICD-10-CM | POA: Diagnosis not present

## 2023-10-05 LAB — CBC WITH DIFFERENTIAL (CANCER CENTER ONLY)
Abs Immature Granulocytes: 0.01 10*3/uL (ref 0.00–0.07)
Basophils Absolute: 0 10*3/uL (ref 0.0–0.1)
Basophils Relative: 0 %
Eosinophils Absolute: 0 10*3/uL (ref 0.0–0.5)
Eosinophils Relative: 1 %
HCT: 37.5 % — ABNORMAL LOW (ref 39.0–52.0)
Hemoglobin: 12.6 g/dL — ABNORMAL LOW (ref 13.0–17.0)
Immature Granulocytes: 1 %
Lymphocytes Relative: 67 %
Lymphs Abs: 1.3 10*3/uL (ref 0.7–4.0)
MCH: 29 pg (ref 26.0–34.0)
MCHC: 33.6 g/dL (ref 30.0–36.0)
MCV: 86.4 fL (ref 80.0–100.0)
Monocytes Absolute: 0.4 10*3/uL (ref 0.1–1.0)
Monocytes Relative: 19 %
Neutro Abs: 0.2 10*3/uL — CL (ref 1.7–7.7)
Neutrophils Relative %: 12 %
Platelet Count: 120 10*3/uL — ABNORMAL LOW (ref 150–400)
RBC: 4.34 MIL/uL (ref 4.22–5.81)
RDW: 15.6 % — ABNORMAL HIGH (ref 11.5–15.5)
WBC Count: 1.9 10*3/uL — ABNORMAL LOW (ref 4.0–10.5)
nRBC: 0 % (ref 0.0–0.2)

## 2023-10-05 LAB — TYPE AND SCREEN
ABO/RH(D): A POS
Antibody Screen: NEGATIVE

## 2023-10-05 LAB — SAMPLE TO BLOOD BANK

## 2023-10-05 MED ORDER — SODIUM CHLORIDE 0.9% FLUSH
10.0000 mL | Freq: Once | INTRAVENOUS | Status: AC
  Administered 2023-10-05: 10 mL

## 2023-10-05 MED ORDER — HEPARIN SOD (PORK) LOCK FLUSH 100 UNIT/ML IV SOLN
500.0000 [IU] | Freq: Once | INTRAVENOUS | Status: AC
  Administered 2023-10-05: 500 [IU]

## 2023-10-05 NOTE — Telephone Encounter (Signed)
CRITICAL VALUE STICKER  CRITICAL VALUE: ANC 0.2  RECEIVER (on-site recipient of call): Sharlette Dense CMA  DATE & TIME NOTIFIED: 10/05/2023 0949  MESSENGER (representative from lab): Pam in lab  MD NOTIFIED: Cherly Hensen  TIME OF NOTIFICATION: 4098  RESPONSE: mad e nurse aware

## 2023-10-05 NOTE — Progress Notes (Signed)
Pt did not need a transfusion of plt/blood based off lab values. Pt informed, de-accessed, and d/c to lobby in stable condition. No further needs at this time.

## 2023-10-05 NOTE — Patient Instructions (Signed)
Platelet Transfusion A platelet transfusion is a procedure in which a person receives donated platelets through an IV. Platelets are parts of blood that stick together and form a clot to help the body stop bleeding after an injury. If you have too few platelets, your blood may have trouble clotting. This may cause you to bleed and bruise very easily. You may need a platelet transfusion if you have a condition that causes a low number of platelets (thrombocytopenia). A platelet transfusion may be used to stop or prevent excessive bleeding. Tell a health care provider about: Any reactions you have had during previous transfusions. Any allergies you have. All medicines you are taking, including vitamins, herbs, eye drops, creams, and over-the-counter medicines. Any bleeding problems you have. Any surgeries you have had. Any medical conditions you have. Whether you are pregnant or may be pregnant. What are the risks? Generally, this is a safe procedure. However, problems may occur, including: Fever. Infection. Allergic reaction to the donated (donor) platelets. Your body's disease-fighting system (immune system) attacking the donor platelets (hemolytic reaction). This is rare. A rare reaction that causes lung damage (transfusion-related acute lung injury). What happens before the procedure? Medicines Ask your health care provider about: Changing or stopping your regular medicines. This is especially important if you are taking diabetes medicines or blood thinners. Taking medicines such as aspirin and ibuprofen. These medicines can thin your blood. Do not take these medicines unless your health care provider tells you to take them. Taking over-the-counter medicines, vitamins, herbs, and supplements. General instructions You will have a blood test to determine your blood type. Your blood type determines what kind of platelets you will be given. Follow instructions from your health care provider  about eating or drinking restrictions. If you have had an allergic reaction to a transfusion in the past, you may be given medicine to help prevent a reaction. Your temperature, blood pressure, pulse, and breathing will be monitored. What happens during the procedure?  An IV will be inserted into one of your veins. For your safety, two health care providers will verify your identity along with the donor platelets about to be infused. A bag of donor platelets will be connected to your IV. The platelets will flow into your bloodstream. This usually takes 30-60 minutes. Your temperature, blood pressure, pulse, and breathing will be monitored during the transfusion. This helps detect early signs of any reaction. You will also be monitored for other symptoms that may indicate a reaction, including chills, hives, or itching. If you have signs of a reaction at any time, your transfusion will be stopped, and you may be given medicine to help manage the reaction. When your transfusion is complete, your IV will be removed. Pressure may be applied to the IV site for a few minutes to stop any bleeding. The IV site will be covered with a bandage (dressing). The procedure may vary among health care providers and hospitals. What can I expect after the procedure? Your blood pressure, temperature, pulse, and breathing will be monitored until you leave the hospital or clinic. You may have some bruising and soreness at your IV site. Follow these instructions at home: Medicines Take over-the-counter and prescription medicines only as told by your health care provider. Talk with your health care provider before you take any medicines that contain aspirin or NSAIDs, such as ibuprofen. These medicines increase your risk for dangerous bleeding. IV site care Check your IV site every day for signs of infection. Check for:  Redness, swelling, or pain. Fluid or blood. If fluid or blood drains from your IV site, use your  hands to press down firmly on a bandage covering the area for a minute or two. Doing this should stop the bleeding. Warmth. Pus or a bad smell. General instructions Change or remove your dressing as told by your health care provider. Return to your normal activities as told by your health care provider. Ask your health care provider what activities are safe for you. Do not take baths, swim, or use a hot tub until your health care provider approves. Ask your health care provider if you may take showers. Keep all follow-up visits. This is important. Contact a health care provider if: You have a headache that does not go away with medicine. You have hives, rash, or itchy skin. You have nausea or vomiting. You feel unusually tired or weak. You have signs of infection at your IV site. Get help right away if: You have a fever or chills. You urinate less often than usual. Your urine is darker colored than normal. You have any of the following: Trouble breathing. Pain in your back, abdomen, or chest. Cool, clammy skin. A fast heartbeat. Summary Platelets are tiny pieces of blood cells that clump together to form a blood clot when you have an injury. If you have too few platelets, your blood may have trouble clotting. A platelet transfusion is a procedure in which you receive donated platelets through an IV. A platelet transfusion may be used to stop or prevent excessive bleeding. After the procedure, check your IV site every day for signs of infection. This information is not intended to replace advice given to you by your health care provider. Make sure you discuss any questions you have with your health care provider. Document Revised: 03/08/2023 Document Reviewed: 02/10/2021 Elsevier Patient Education  2024 [employer].

## 2023-10-05 NOTE — Progress Notes (Signed)
Patient canceled this week's appointment and has delay of bleomycin.  Will postpone week 3 of bleomycin by one day next week.  For cycle 3, patient can be treated unless febrile neutropenia, infection or other acute complications.  CBC and Type and screen twice weekly to be continued until 3/31 and reassess.  If Platelet <50K, transfuse one unit of platelet. Patient is on anticoagulation for life threatening PE. If febrile neutropenia, bleeding or other acute complications report to ED and hold treatment. If hemolgobin <7, transfuse one unit of pRBC

## 2023-10-06 ENCOUNTER — Other Ambulatory Visit: Payer: Self-pay

## 2023-10-06 NOTE — Progress Notes (Unsigned)
Patient Care Team: Patient, [name] - General (General Practice)  Clinic Day:  10/12/2023  Referring physician: [name], MD  ASSESSMENT & PLAN:   Assessment & Plan: 33 y.o. male with diagnosis of left seminoma s/p orchiectomy presented for follow up for treatment of testicular cancer undergoing BEP.    Diagnosis: pT2 [id number] normal TM. Initial imaging showed bilateral axillary lymph nodes, right greater than left, suspect at least 2 lymph nodes with abnormal cortical thickening in the right axilla, retroperitoneal, bilateral iliac chain lymph nodes, bilateral inguinal lymph nodes. MRI showed benign adrenal nodule. Initially uninsured, delay start of treatment and ability for additional biopsy and HIV positive. He then developed PE and recurrence after cycle 1.  Patient has several self-cancellations and decided no-shows resulted in more delays.   Will continue treatment next week, patient will need twice weekly transfusion during week 2 and 3 and then until platelet is consistently above 50,000. Due to his delay on bleo will move to a later day in the following weeks.  Refill apixaban today.  Primary seminoma of left testis (HCC) Will proceed with bleomycin.  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 Follow-up for thrombocytopenia during week 2 and 3 Transfuse platelet to keep >50K  Thrombocytopenia (HCC) Keep platelet >50K on apixaban Transfuse 1 unit of platelet if 50 or less.   Patient should get platelet transfusions as needed with lab twice weekly during weeks 2 and 3 of next cycle. Become plan adjusted.  Added type and screen and CBC during week 2 and 3.  Total time 34 minutes. The patient understands the plans discussed today and is in agreement with them.  He knows to contact our office if he develops concerns prior to his next appointment.  [name], MD  [facility] - [facility] DE[facility] [address] [place] [address] Dept: [phone] Dept Fax: [phone]   Orders Placed This Encounter  Procedures   CBC (Cancer Center Only)    Standing Status:   Future    Expected Date:   10/23/2023    Expiration Date:   10/22/2024   CBC (Cancer Center Only)    Standing Status:   Future    Expected Date:   10/30/2023    Expiration Date:   10/29/2024      CHIEF COMPLAINT:  CC: Testicular cancer  Current Treatment:  BEP  INTERVAL HISTORY:  Steve Stewart is here today for repeat clinical assessment. Overall he is feeling well.  No chest pain, shortness of breath, coughing, difficulty breathing, stomach pain, diarrhea, bleeding.  Left leg swelling towards the end of the day improved in the morning.  He needs Eliquis refill.  No signs of infection.  I have reviewed the past medical history, past surgical history, social history and family history with the patient and they are unchanged from previous note.  ALLERGIES:  has no known allergies.  MEDICATIONS:  Current Outpatient Medications  Medication Sig Dispense Refill   apixaban (ELIQUIS) 5 MG TABS tablet Take 1 tablet (5 mg total) by mouth 2 (two) times daily. 180 tablet 0   acetaminophen (TYLENOL) 650 MG CR tablet Take 1,300 mg by mouth every 8 (eight) hours as needed for pain.     albuterol (VENTOLIN HFA) 108 (90 Base) MCG/ACT inhaler Inhale 1 puff into the lungs every 6 (  six) hours as needed for wheezing or shortness of breath.     ALPRAZolam (XANAX) 0.25 MG tablet Take 1 tablet (0.25 mg total) by mouth at bedtime as needed for anxiety or sleep. (Patient not taking: Reported on 09/24/2023) 5 tablet 0   bictegravir-emtricitabine-tenofovir AF (BIKTARVY) 50-200-25 MG TABS tablet Take 1 tablet by mouth daily. 30 tablet 2   dexamethasone (DECADRON) 4 MG tablet Take 2 tablets (8 mg total) by mouth daily. Start the day after last cisplatin dose  on day 6 of chemo x 3 days.Take with food. (Patient not taking: Reported on 09/24/2023) 30 tablet 1   docusate sodium (COLACE) 100 MG capsule Take 2 capsules (200 mg total) by mouth 2 (two) times daily. 20 capsule 0   famotidine (PEPCID) 20 MG tablet Take 1 tablet (20 mg total) by mouth 2 (two) times daily. 30 tablet 2   lidocaine (XYLOCAINE) 2 % solution Use as directed 15 mLs in the mouth or throat every 3 (three) hours as needed for mouth pain. Mix 1 to 1 ratio with Maalox to rinse for pain 100 mL 0   lidocaine-prilocaine (EMLA) cream Apply to affected area once 30 g 3   Melatonin 10 MG TABS Take 1 tablet by mouth at bedtime. 30 tablet 0   oxyCODONE-acetaminophen (PERCOCET) 10-325 MG tablet Take 1 tablet by mouth every 4 (four) hours as needed for pain. (Patient not taking: Reported on 09/24/2023) 30 tablet 0   polyethylene glycol (MIRALAX / GLYCOLAX) 17 g packet Take 17 g by mouth daily. 14 each 0   prochlorperazine (COMPAZINE) 10 MG tablet Take 1 tablet (10 mg total) by mouth every 6 (six) hours as needed for nausea or vomiting. 30 tablet 1   traZODone (DESYREL) 50 MG tablet Take 1 tablet (50 mg total) by mouth at bedtime. 90 tablet 1   No current facility-administered medications for this visit.   Facility-Administered Medications Ordered in Other Visits  Medication Dose Route Frequency Provider Last Rate Last Admin   bleomycin (BLEOCIN) 30 Units in sodium chloride 0.9 % 50 mL chemo infusion  30 Units Intravenous Once [name], MD 360 mL/hr at 10/12/23 0916 30 Units at 10/12/23 0916   sodium chloride flush (NS) 0.9 % injection 10 mL  10 mL Intracatheter PRN [name], MD   10 mL at 10/12/23 1610    HISTORY OF PRESENT ILLNESS:   Oncology History  Primary seminoma of left testis (HCC)  04/28/2023 Initial Diagnosis   Primary seminoma of left testis [facility]) Presented to ED for worsening testicular pain and presented with 2 months history of testicular mass.   04/28/2023 Imaging    [place] 1. Positive for abnormally enlarged and heterogeneous but vascular Left Testis highly suspicious for Testicular Neoplasm. Recommend Urology consultation. 2. Negative for testicular torsion.  Right testis appears normal.   04/28/2023 Imaging   CT CAP 1. Partially visualized thickening of the skin of the left scrotum. 2. Prominent bilateral axillary lymph nodes, right greater than left. Suspect at least 2 lymph nodes with abnormal cortical thickening in the right axilla. 3. Shotty retroperitoneal, bilateral iliac chain lymph nodes measuring up to 1.1 cm in short axis. 4. Bilateral inguinal lymph nodes with borderline thickened left inguinal lymph nodes measuring up to 1.1 cm in short axis. 5. 1.2 cm right adrenal mass, indeterminate. Attention on follow-up is recommended. Alternatively evaluation with MRI may be considered. 6. Hazy attenuation within the anterior mediastinum without defined mass may represent residual thymus. Attention on  follow-up is recommended. 7. Cholelithiasis. 8. L4-L5 bilateral pars articularis defect. 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 10. Further evaluation with axillary and inguinal ultrasound may be considered.   05/02/2023 Surgery   TESTICLE, LEFT, ORCHIECTOMY:  Seminoma, size 6.1 cm  Tumor limited to testis with rete testis and lymphovascular invasion  (pT2)  Germ cell neoplasia in situ is present  Spermatic cord and all margins of resection are negative for tumor     06/25/2023 Imaging   CT AP IMPRESSION: 1. Stable mild periportal, bilateral inguinal, bilateral external iliac  and bilateral common iliac lymphadenopathy. 2. No new or progressive metastatic disease in the abdomen or pelvis. 3. Solid peripheral basilar 2.0 cm right lower lobe pulmonary nodule, new from 04/28/2023 CT, similar to recent 06/22/2023 chest CT, favoring evolving pulmonary infarct related to recent diagnosis of acute pulmonary embolism.  Additional patchy consolidation at the posterior right lung base seen on prior chest CT has resolved. 4. Cholelithiasis. 5. Stable bilateral adrenal nodules characterized as benign adenomas on recent MRI, requiring no further imaging follow-up.   07/09/2023 -  Chemotherapy   Patient is on Treatment Plan : TESTICULAR BEP q21d x 3 Cycles         REVIEW OF SYSTEMS:   All relevant systems were reviewed with the patient and are negative.   VITALS:  There were no vitals taken for this visit.  Wt Readings from Last 3 Encounters:  10/12/23 285 lb 4 oz (129.4 kg)  10/08/23 283 lb 3 oz (128.5 kg)  10/05/23 279 lb (126.6 kg)    There is no height or weight on file to calculate BMI.  Performance status (ECOG): 0 - Asymptomatic  PHYSICAL EXAM:   GENERAL:alert, no distress and comfortable SKIN: skin color normal, no rashes  EYES: normal, sclera clear NECK: supple,  non-tender, without erythema LUNGS: clear to auscultation with normal breathing effort.  No wheeze or rales HEART: regular rate & rhythm and no murmurs and no lower extremity edema ABDOMEN: abdomen soft, non-tender and nondistended Musculoskeletal: no edema  LABORATORY DATA:  I have reviewed the data as listed    Component Value Date/Time   NA 137 10/12/2023 0811   K 4.1 10/12/2023 0811   CL 107 10/12/2023 0811   CO2 25 10/12/2023 0811   GLUCOSE 86 10/12/2023 0811   BUN 17 10/12/2023 0811   CREATININE 1.01 10/12/2023 0811   CALCIUM 8.9 10/12/2023 0811   PROT 6.4 (L) 10/12/2023 0811   ALBUMIN 3.9 10/12/2023 0811   AST 11 (L) 10/12/2023 0811   ALT 16 10/12/2023 0811   ALKPHOS 58 10/12/2023 0811   BILITOT 0.2 10/12/2023 0811   GFRNONAA >60 10/12/2023 0811   GFRAA >90 06/16/2012 0048    No results found for: "SPEP", "UPEP"  Lab Results  Component Value Date   WBC 6.4 10/12/2023   NEUTROABS 1.2 (L) 10/08/2023   HGB 12.3 (L) 10/12/2023   HCT 36.7 (L) 10/12/2023   MCV 85.9 10/12/2023   PLT 203 10/12/2023       Chemistry      Component Value Date/Time   NA 137 10/12/2023 0811   K 4.1 10/12/2023 0811   CL 107 10/12/2023 0811   CO2 25 10/12/2023 0811   BUN 17 10/12/2023 0811   CREATININE 1.01 10/12/2023 0811      Component Value Date/Time   CALCIUM 8.9 10/12/2023 0811   ALKPHOS 58 10/12/2023 0811   AST 11 (L) 10/12/2023 1610  ALT 16 10/12/2023 0811   BILITOT 0.2 10/12/2023 7829       RADIOGRAPHIC STUDIES: I have personally reviewed the radiological images as listed and agreed with the findings in the report. No results found.

## 2023-10-08 ENCOUNTER — Inpatient Hospital Stay: Payer: Medicaid Other

## 2023-10-08 ENCOUNTER — Telehealth: Payer: Self-pay

## 2023-10-08 ENCOUNTER — Other Ambulatory Visit: Payer: Self-pay

## 2023-10-08 VITALS — BP 126/78 | HR 88 | Temp 97.7°F | Resp 16 | Wt 283.2 lb

## 2023-10-08 DIAGNOSIS — D696 Thrombocytopenia, unspecified: Secondary | ICD-10-CM

## 2023-10-08 DIAGNOSIS — C6292 Malignant neoplasm of left testis, unspecified whether descended or undescended: Secondary | ICD-10-CM

## 2023-10-08 DIAGNOSIS — Z5111 Encounter for antineoplastic chemotherapy: Secondary | ICD-10-CM | POA: Diagnosis not present

## 2023-10-08 LAB — CBC WITH DIFFERENTIAL (CANCER CENTER ONLY)
Abs Immature Granulocytes: 0.15 10*3/uL — ABNORMAL HIGH (ref 0.00–0.07)
Basophils Absolute: 0 10*3/uL (ref 0.0–0.1)
Basophils Relative: 1 %
Eosinophils Absolute: 0 10*3/uL (ref 0.0–0.5)
Eosinophils Relative: 0 %
HCT: 36.2 % — ABNORMAL LOW (ref 39.0–52.0)
Hemoglobin: 12.3 g/dL — ABNORMAL LOW (ref 13.0–17.0)
Immature Granulocytes: 4 %
Lymphocytes Relative: 47 %
Lymphs Abs: 1.7 10*3/uL (ref 0.7–4.0)
MCH: 29.1 pg (ref 26.0–34.0)
MCHC: 34 g/dL (ref 30.0–36.0)
MCV: 85.8 fL (ref 80.0–100.0)
Monocytes Absolute: 0.6 10*3/uL (ref 0.1–1.0)
Monocytes Relative: 15 %
Neutro Abs: 1.2 10*3/uL — ABNORMAL LOW (ref 1.7–7.7)
Neutrophils Relative %: 33 %
Platelet Count: 182 10*3/uL (ref 150–400)
RBC: 4.22 MIL/uL (ref 4.22–5.81)
RDW: 15.6 % — ABNORMAL HIGH (ref 11.5–15.5)
WBC Count: 3.7 10*3/uL — ABNORMAL LOW (ref 4.0–10.5)
nRBC: 0 % (ref 0.0–0.2)

## 2023-10-08 LAB — SAMPLE TO BLOOD BANK

## 2023-10-08 MED ORDER — SODIUM CHLORIDE 0.9% FLUSH
10.0000 mL | Freq: Once | INTRAVENOUS | Status: AC
Start: 2023-10-08 — End: 2023-10-08
  Administered 2023-10-08: 10 mL

## 2023-10-08 MED ORDER — SODIUM CHLORIDE 0.9% FLUSH
10.0000 mL | Freq: Once | INTRAVENOUS | Status: AC
  Administered 2023-10-08: 10 mL via INTRAVENOUS

## 2023-10-08 MED ORDER — HEPARIN SOD (PORK) LOCK FLUSH 100 UNIT/ML IV SOLN
500.0000 [IU] | Freq: Once | INTRAVENOUS | Status: AC
  Administered 2023-10-08: 500 [IU] via INTRAVENOUS

## 2023-10-08 NOTE — Progress Notes (Signed)
Pt reported to [facility] today for platelet transfusion. Pt's Hgb 12.3 g/dL and Plts 169 K/uL. Per parameters Pt does not need transfusion today. This RN made Pt aware of results and provided Pt with a copy of labs. Pt agreeable to not receive transfusion today and stated "I feel good". Pt discharged home in stable condition without complaint.

## 2023-10-08 NOTE — Telephone Encounter (Signed)
Called Steve Stewart to make sure he was aware of his appts being adjusted this week. He was informed that his Wednesday appt was cancelled and he will get Bleocin and platelets on Friday together as well as see Dr. Cherly Hensen. Lorayne Marek, RN

## 2023-10-09 ENCOUNTER — Other Ambulatory Visit: Payer: Medicaid Other

## 2023-10-09 ENCOUNTER — Ambulatory Visit: Payer: Medicaid Other

## 2023-10-09 ENCOUNTER — Ambulatory Visit: Payer: Medicaid Other | Admitting: Physician Assistant

## 2023-10-10 ENCOUNTER — Inpatient Hospital Stay: Payer: 59

## 2023-10-10 ENCOUNTER — Inpatient Hospital Stay: Payer: Medicaid Other

## 2023-10-11 ENCOUNTER — Institutional Professional Consult (permissible substitution): Payer: 59 | Admitting: Internal Medicine

## 2023-10-11 NOTE — Progress Notes (Deleted)
 Steve Stewart, male    DOB: [birth year]   MRN: [id number]   Brief patient profile:  57  *** referred to pulmonary clinic 10/11/2023 by *** for ***        History of Present Illness  10/11/2023  Pulmonary/ 1st office eval/Steve Stewart  No chief complaint on file.    Dyspnea:  *** Cough: *** Sleep: *** SABA use: *** 02 use:*** LDSCT:***  No obvious day to day or daytime pattern/variability or assoc excess/ purulent sputum or mucus plugs or hemoptysis or cp or chest tightness, subjective wheeze or overt sinus or hb symptoms.    Also denies any obvious fluctuation of symptoms with weather or environmental changes or other aggravating or alleviating factors except as outlined above   No unusual exposure hx or h/o childhood pna/ asthma or knowledge of premature birth.  Current Allergies, Complete Past Medical History, Past Surgical History, Family History, and Social History were reviewed in [employer] record.  ROS  The following are not active complaints unless bolded Hoarseness, sore throat, dysphagia, dental problems, itching, sneezing,  nasal congestion or discharge of excess mucus or purulent secretions, ear ache,   fever, chills, sweats, unintended wt loss or wt gain, classically pleuritic or exertional cp,  orthopnea pnd or arm/hand swelling  or leg swelling, presyncope, palpitations, abdominal pain, anorexia, nausea, vomiting, diarrhea  or change in bowel habits or change in bladder habits, change in stools or change in urine, dysuria, hematuria,  rash, arthralgias, visual complaints, headache, numbness, weakness or ataxia or problems with walking or coordination,  change in mood or  memory.             Outpatient Medications Prior to Visit  Medication Sig Dispense Refill   acetaminophen (TYLENOL) 650 MG CR tablet Take 1,300 mg by mouth every 8 (eight) hours as needed for pain.     albuterol (VENTOLIN HFA) 108 (90 Base) MCG/ACT inhaler Inhale 1 puff into  the lungs every 6 (six) hours as needed for wheezing or shortness of breath.     ALPRAZolam (XANAX) 0.25 MG tablet Take 1 tablet (0.25 mg total) by mouth at bedtime as needed for anxiety or sleep. (Patient not taking: Reported on 09/24/2023) 5 tablet 0   Apixaban Starter Pack, 10mg  and 5mg , (ELIQUIS DVT/PE STARTER PACK) Take as directed on package: start with two-5mg  tablets twice daily for 7 days. On day 8, switch to one-5mg  tablet twice daily. 74 each 0   bictegravir-emtricitabine-tenofovir AF (BIKTARVY) 50-200-25 MG TABS tablet Take 1 tablet by mouth daily. 30 tablet 2   dexamethasone (DECADRON) 4 MG tablet Take 2 tablets (8 mg total) by mouth daily. Start the day after last cisplatin dose on day 6 of chemo x 3 days.Take with food. (Patient not taking: Reported on 09/24/2023) 30 tablet 1   docusate sodium (COLACE) 100 MG capsule Take 2 capsules (200 mg total) by mouth 2 (two) times daily. 20 capsule 0   famotidine (PEPCID) 20 MG tablet Take 1 tablet (20 mg total) by mouth 2 (two) times daily. 30 tablet 2   lidocaine (XYLOCAINE) 2 % solution Use as directed 15 mLs in the mouth or throat every 3 (three) hours as needed for mouth pain. Mix 1 to 1 ratio with Maalox to rinse for pain 100 mL 0   lidocaine-prilocaine (EMLA) cream Apply to affected area once 30 g 3   Melatonin 10 MG TABS Take 1 tablet by mouth at bedtime. 30 tablet 0   oxyCODONE-acetaminophen (  PERCOCET) 10-325 MG tablet Take 1 tablet by mouth every 4 (four) hours as needed for pain. (Patient not taking: Reported on 09/24/2023) 30 tablet 0   polyethylene glycol (MIRALAX / GLYCOLAX) 17 g packet Take 17 g by mouth daily. 14 each 0   prochlorperazine (COMPAZINE) 10 MG tablet Take 1 tablet (10 mg total) by mouth every 6 (six) hours as needed for nausea or vomiting. 30 tablet 1   traZODone (DESYREL) 50 MG tablet Take 1 tablet (50 mg total) by mouth at bedtime. 90 tablet 1   No facility-administered medications prior to visit.    Past Medical  History:  Diagnosis Date   HIV infection [facility])    Medical history non-contributory    Testicular cancer (HCC)       Objective:     There were no vitals taken for this visit.         Assessment   No problem-specific Assessment & Plan notes found for this encounter.     Steve Hughs, MD 10/11/2023

## 2023-10-11 NOTE — Assessment & Plan Note (Signed)
Keep platelet >50K on apixaban Transfuse 1 unit of platelet if 50 or less.

## 2023-10-11 NOTE — Assessment & Plan Note (Signed)
Will proceed with bleomycin.  Will monitor pancytopenia closely

## 2023-10-11 NOTE — Assessment & Plan Note (Signed)
Continue apixaban 5 mg twice daily. Follow-up for thrombocytopenia during week 2 and 3 Transfuse platelet to keep >50K

## 2023-10-12 ENCOUNTER — Inpatient Hospital Stay (HOSPITAL_BASED_OUTPATIENT_CLINIC_OR_DEPARTMENT_OTHER): Payer: 59

## 2023-10-12 ENCOUNTER — Inpatient Hospital Stay: Payer: Medicaid Other

## 2023-10-12 ENCOUNTER — Other Ambulatory Visit (HOSPITAL_COMMUNITY): Payer: Self-pay

## 2023-10-12 ENCOUNTER — Inpatient Hospital Stay: Payer: 59

## 2023-10-12 ENCOUNTER — Other Ambulatory Visit: Payer: Self-pay

## 2023-10-12 VITALS — BP 117/71 | HR 83 | Temp 98.6°F | Resp 16 | Wt 285.2 lb

## 2023-10-12 DIAGNOSIS — C6292 Malignant neoplasm of left testis, unspecified whether descended or undescended: Secondary | ICD-10-CM

## 2023-10-12 DIAGNOSIS — I2609 Other pulmonary embolism with acute cor pulmonale: Secondary | ICD-10-CM | POA: Diagnosis not present

## 2023-10-12 DIAGNOSIS — D696 Thrombocytopenia, unspecified: Secondary | ICD-10-CM

## 2023-10-12 DIAGNOSIS — Z5111 Encounter for antineoplastic chemotherapy: Secondary | ICD-10-CM | POA: Diagnosis not present

## 2023-10-12 LAB — CMP (CANCER CENTER ONLY)
ALT: 16 U/L (ref 0–44)
AST: 11 U/L — ABNORMAL LOW (ref 15–41)
Albumin: 3.9 g/dL (ref 3.5–5.0)
Alkaline Phosphatase: 58 U/L (ref 38–126)
Anion gap: 5 (ref 5–15)
BUN: 17 mg/dL (ref 6–20)
CO2: 25 mmol/L (ref 22–32)
Calcium: 8.9 mg/dL (ref 8.9–10.3)
Chloride: 107 mmol/L (ref 98–111)
Creatinine: 1.01 mg/dL (ref 0.61–1.24)
GFR, Estimated: >60 mL/min (ref >60)
Glucose, Bld: 86 mg/dL (ref 70–99)
Potassium: 4.1 mmol/L (ref 3.5–5.1)
Sodium: 137 mmol/L (ref 135–145)
Total Bilirubin: 0.2 mg/dL (ref 0.0–1.2)
Total Protein: 6.4 g/dL — ABNORMAL LOW (ref 6.5–8.1)

## 2023-10-12 LAB — CBC (CANCER CENTER ONLY)
HCT: 36.7 % — ABNORMAL LOW (ref 39.0–52.0)
Hemoglobin: 12.3 g/dL — ABNORMAL LOW (ref 13.0–17.0)
MCH: 28.8 pg (ref 26.0–34.0)
MCHC: 33.5 g/dL (ref 30.0–36.0)
MCV: 85.9 fL (ref 80.0–100.0)
Platelet Count: 203 10*3/uL (ref 150–400)
RBC: 4.27 MIL/uL (ref 4.22–5.81)
RDW: 15.8 % — ABNORMAL HIGH (ref 11.5–15.5)
WBC Count: 6.4 10*3/uL (ref 4.0–10.5)
nRBC: 0 % (ref 0.0–0.2)

## 2023-10-12 LAB — SAMPLE TO BLOOD BANK

## 2023-10-12 MED ORDER — PROCHLORPERAZINE MALEATE 10 MG PO TABS
10.0000 mg | ORAL_TABLET | Freq: Once | ORAL | Status: AC
Start: 2023-10-12 — End: 2023-10-12
  Administered 2023-10-12: 10 mg via ORAL
  Filled 2023-10-12: qty 1

## 2023-10-12 MED ORDER — HEPARIN SOD (PORK) LOCK FLUSH 100 UNIT/ML IV SOLN
500.0000 [IU] | Freq: Once | INTRAVENOUS | Status: AC | PRN
Start: 2023-10-12 — End: 2023-10-12
  Administered 2023-10-12: 500 [IU]

## 2023-10-12 MED ORDER — SODIUM CHLORIDE 0.9% FLUSH
10.0000 mL | Freq: Once | INTRAVENOUS | Status: AC
  Administered 2023-10-12: 10 mL

## 2023-10-12 MED ORDER — SODIUM CHLORIDE 0.9 % IV SOLN
30.0000 [IU] | Freq: Once | INTRAVENOUS | Status: AC
  Administered 2023-10-12: 30 [IU] via INTRAVENOUS
  Filled 2023-10-12: qty 10

## 2023-10-12 MED ORDER — APIXABAN 5 MG PO TABS
5.0000 mg | ORAL_TABLET | Freq: Two times a day (BID) | ORAL | 0 refills | Status: DC
  Filled 2023-10-12: qty 180, 90d supply, fill #0

## 2023-10-12 MED ORDER — SODIUM CHLORIDE 0.9 % IV SOLN: Freq: Once | INTRAVENOUS | Status: AC

## 2023-10-12 MED ORDER — SODIUM CHLORIDE 0.9% FLUSH
10.0000 mL | INTRAVENOUS | Status: DC | PRN
  Administered 2023-10-12: 10 mL

## 2023-10-12 MED FILL — Fosaprepitant Dimeglumine For IV Infusion 150 MG (Base Eq): INTRAVENOUS | Qty: 5 | Status: AC

## 2023-10-12 NOTE — Patient Instructions (Signed)
 [facility]  Discharge Instructions: Thank you for choosing [facility] to provide your oncology and hematology care.   If you have a lab appointment with the Cancer Center, please go directly to the Cancer Center and check in at the registration area.   Wear comfortable clothing and clothing appropriate for easy access to any Portacath or PICC line.   We strive to give you quality time with your provider. You may need to reschedule your appointment if you arrive late (15 or more minutes).  Arriving late affects you and other patients whose appointments are after yours.  Also, if you miss three or more appointments without notifying the office, you may be dismissed from the clinic at the provider's discretion.      For prescription refill requests, have your pharmacy contact our office and allow 72 hours for refills to be completed.    Today you received the following chemotherapy and/or immunotherapy agents :  Bleomycin  To help prevent nausea and vomiting after your treatment, we encourage you to take your nausea medication as directed.  BELOW ARE SYMPTOMS THAT SHOULD BE REPORTED IMMEDIATELY: *FEVER GREATER THAN 100.4 F (38 C) OR HIGHER *CHILLS OR SWEATING *NAUSEA AND VOMITING THAT IS NOT CONTROLLED WITH YOUR NAUSEA MEDICATION *UNUSUAL SHORTNESS OF BREATH *UNUSUAL BRUISING OR BLEEDING *URINARY PROBLEMS (pain or burning when urinating, or frequent urination) *BOWEL PROBLEMS (unusual diarrhea, constipation, pain near the anus) TENDERNESS IN MOUTH AND THROAT WITH OR WITHOUT PRESENCE OF ULCERS (sore throat, sores in mouth, or a toothache) UNUSUAL RASH, SWELLING OR PAIN  UNUSUAL VAGINAL DISCHARGE OR ITCHING   Items with * indicate a potential emergency and should be followed up as soon as possible or go to the Emergency Department if any problems should occur.  Please show the CHEMOTHERAPY ALERT CARD or IMMUNOTHERAPY  ALERT CARD at check-in to the Emergency Department and triage nurse.  Should you have questions after your visit or need to cancel or reschedule your appointment, please contact [facility] - A DEPT OF [facility]  Dept: [phone]  and follow the prompts.  Office hours are 8:00 a.m. to 4:30 p.m. Monday - Friday. Please note that voicemails left after 4:00 p.m. may not be returned until the following business day.  We are closed weekends and major holidays. You have access to a nurse at all times for urgent questions. Please call the main number to the clinic Dept: [phone] and follow the prompts.   For any non-urgent questions, you may also contact your provider using MyChart. We now offer e-Visits for anyone 41 and older to request care online for non-urgent symptoms. For details visit [email or web address].   Also download the MyChart app! Go to the app store, search "MyChart", open the app, select [place], and log in with your MyChart username and password.

## 2023-10-15 ENCOUNTER — Inpatient Hospital Stay: Payer: 59

## 2023-10-15 ENCOUNTER — Other Ambulatory Visit (HOSPITAL_COMMUNITY): Payer: Self-pay

## 2023-10-15 ENCOUNTER — Encounter: Payer: Self-pay | Admitting: Internal Medicine

## 2023-10-15 ENCOUNTER — Other Ambulatory Visit: Payer: Self-pay

## 2023-10-15 VITALS — BP 128/85 | HR 83 | Temp 98.3°F | Resp 18 | Wt 287.8 lb

## 2023-10-15 DIAGNOSIS — Z5111 Encounter for antineoplastic chemotherapy: Secondary | ICD-10-CM | POA: Diagnosis not present

## 2023-10-15 DIAGNOSIS — D696 Thrombocytopenia, unspecified: Secondary | ICD-10-CM

## 2023-10-15 DIAGNOSIS — C6292 Malignant neoplasm of left testis, unspecified whether descended or undescended: Secondary | ICD-10-CM

## 2023-10-15 LAB — CBC WITH DIFFERENTIAL (CANCER CENTER ONLY)
Abs Immature Granulocytes: 0.06 10*3/uL (ref 0.00–0.07)
Basophils Absolute: 0 10*3/uL (ref 0.0–0.1)
Basophils Relative: 1 %
Eosinophils Absolute: 0 10*3/uL (ref 0.0–0.5)
Eosinophils Relative: 0 %
HCT: 36.1 % — ABNORMAL LOW (ref 39.0–52.0)
Hemoglobin: 11.9 g/dL — ABNORMAL LOW (ref 13.0–17.0)
Immature Granulocytes: 1 %
Lymphocytes Relative: 31 %
Lymphs Abs: 1.7 10*3/uL (ref 0.7–4.0)
MCH: 29.2 pg (ref 26.0–34.0)
MCHC: 33 g/dL (ref 30.0–36.0)
MCV: 88.7 fL (ref 80.0–100.0)
Monocytes Absolute: 0.8 10*3/uL (ref 0.1–1.0)
Monocytes Relative: 14 %
Neutro Abs: 3 10*3/uL (ref 1.7–7.7)
Neutrophils Relative %: 53 %
Platelet Count: 158 10*3/uL (ref 150–400)
RBC: 4.07 MIL/uL — ABNORMAL LOW (ref 4.22–5.81)
RDW: 15.9 % — ABNORMAL HIGH (ref 11.5–15.5)
WBC Count: 5.6 10*3/uL (ref 4.0–10.5)
nRBC: 0 % (ref 0.0–0.2)

## 2023-10-15 LAB — SAMPLE TO BLOOD BANK

## 2023-10-15 LAB — CMP (CANCER CENTER ONLY)
ALT: 15 U/L (ref 0–44)
AST: 12 U/L — ABNORMAL LOW (ref 15–41)
Albumin: 3.9 g/dL (ref 3.5–5.0)
Alkaline Phosphatase: 54 U/L (ref 38–126)
Anion gap: 4 — ABNORMAL LOW (ref 5–15)
BUN: 23 mg/dL — ABNORMAL HIGH (ref 6–20)
CO2: 28 mmol/L (ref 22–32)
Calcium: 9 mg/dL (ref 8.9–10.3)
Chloride: 106 mmol/L (ref 98–111)
Creatinine: 1.11 mg/dL (ref 0.61–1.24)
GFR, Estimated: >60 mL/min (ref >60)
Glucose, Bld: 98 mg/dL (ref 70–99)
Potassium: 4.3 mmol/L (ref 3.5–5.1)
Sodium: 138 mmol/L (ref 135–145)
Total Bilirubin: 0.2 mg/dL (ref 0.0–1.2)
Total Protein: 6.5 g/dL (ref 6.5–8.1)

## 2023-10-15 LAB — LACTATE DEHYDROGENASE: LDH: 178 U/L (ref 98–192)

## 2023-10-15 LAB — MAGNESIUM: Magnesium: 1.7 mg/dL (ref 1.7–2.4)

## 2023-10-15 MED ORDER — SODIUM CHLORIDE 0.9 % IV SOLN: Freq: Once | INTRAVENOUS | Status: AC

## 2023-10-15 MED ORDER — HEPARIN SOD (PORK) LOCK FLUSH 100 UNIT/ML IV SOLN
500.0000 [IU] | Freq: Once | INTRAVENOUS | Status: AC | PRN
  Administered 2023-10-15: 500 [IU]

## 2023-10-15 MED ORDER — DEXAMETHASONE SODIUM PHOSPHATE 10 MG/ML IJ SOLN
10.0000 mg | Freq: Once | INTRAMUSCULAR | Status: AC
  Administered 2023-10-15: 10 mg via INTRAVENOUS
  Filled 2023-10-15: qty 1

## 2023-10-15 MED ORDER — SODIUM CHLORIDE 0.9% FLUSH
10.0000 mL | INTRAVENOUS | Status: DC | PRN
  Administered 2023-10-15: 10 mL

## 2023-10-15 MED ORDER — FOSAPREPITANT DIMEGLUMINE INJECTION 150 MG
150.0000 mg | Freq: Once | INTRAVENOUS | Status: AC
  Administered 2023-10-15: 150 mg via INTRAVENOUS
  Filled 2023-10-15: qty 150

## 2023-10-15 MED ORDER — SODIUM CHLORIDE 0.9 % IV SOLN
100.0000 mg/m2 | Freq: Once | INTRAVENOUS | Status: AC
  Administered 2023-10-15: 240 mg via INTRAVENOUS
  Filled 2023-10-15: qty 12

## 2023-10-15 MED ORDER — MAGNESIUM SULFATE 2 GM/50ML IV SOLN
2.0000 g | Freq: Once | INTRAVENOUS | Status: AC
Start: 2023-10-15 — End: 2023-10-15
  Administered 2023-10-15: 2 g via INTRAVENOUS
  Filled 2023-10-15: qty 50

## 2023-10-15 MED ORDER — CISPLATIN CHEMO INJECTION 100MG/100ML
20.0000 mg/m2 | Freq: Once | INTRAVENOUS | Status: AC
  Administered 2023-10-15: 50 mg via INTRAVENOUS
  Filled 2023-10-15: qty 50

## 2023-10-15 MED ORDER — POTASSIUM CHLORIDE IN NACL 20-0.9 MEQ/L-% IV SOLN
Freq: Once | INTRAVENOUS | Status: AC
  Filled 2023-10-15: qty 1000

## 2023-10-15 MED ORDER — PALONOSETRON HCL INJECTION 0.25 MG/5ML
0.2500 mg | Freq: Once | INTRAVENOUS | Status: AC
  Administered 2023-10-15: 0.25 mg via INTRAVENOUS
  Filled 2023-10-15: qty 5

## 2023-10-16 ENCOUNTER — Other Ambulatory Visit: Payer: Self-pay

## 2023-10-16 ENCOUNTER — Inpatient Hospital Stay: Payer: 59

## 2023-10-16 ENCOUNTER — Other Ambulatory Visit (HOSPITAL_COMMUNITY): Payer: Self-pay

## 2023-10-16 VITALS — BP 137/70 | HR 100 | Temp 97.7°F | Resp 19 | Wt 289.8 lb

## 2023-10-16 DIAGNOSIS — C6292 Malignant neoplasm of left testis, unspecified whether descended or undescended: Secondary | ICD-10-CM

## 2023-10-16 DIAGNOSIS — Z5111 Encounter for antineoplastic chemotherapy: Secondary | ICD-10-CM | POA: Diagnosis not present

## 2023-10-16 DIAGNOSIS — B20 Human immunodeficiency virus [HIV] disease: Secondary | ICD-10-CM

## 2023-10-16 LAB — AFP TUMOR MARKER: AFP, Serum, Tumor Marker: <1.8 ng/mL (ref 0.0–6.9)

## 2023-10-16 LAB — BETA HCG QUANT (REF LAB): hCG Quant: <1 m[IU]/mL (ref 0–3)

## 2023-10-16 MED ORDER — MAGNESIUM SULFATE 2 GM/50ML IV SOLN
2.0000 g | Freq: Once | INTRAVENOUS | Status: AC
  Administered 2023-10-16: 2 g via INTRAVENOUS
  Filled 2023-10-16: qty 50

## 2023-10-16 MED ORDER — BIKTARVY 50-200-25 MG PO TABS: 1.0000 | ORAL_TABLET | Freq: Every day | ORAL | 2 refills | Status: DC

## 2023-10-16 MED ORDER — SODIUM CHLORIDE 0.9% FLUSH
10.0000 mL | INTRAVENOUS | Status: DC | PRN
Start: 2023-10-16 — End: 2023-10-16
  Administered 2023-10-16: 10 mL

## 2023-10-16 MED ORDER — POTASSIUM CHLORIDE IN NACL 20-0.9 MEQ/L-% IV SOLN
Freq: Once | INTRAVENOUS | Status: AC
  Filled 2023-10-16: qty 1000

## 2023-10-16 MED ORDER — SODIUM CHLORIDE 0.9 % IV SOLN
20.0000 mg/m2 | Freq: Once | INTRAVENOUS | Status: AC
  Administered 2023-10-16: 50 mg via INTRAVENOUS
  Filled 2023-10-16: qty 50

## 2023-10-16 MED ORDER — HEPARIN SOD (PORK) LOCK FLUSH 100 UNIT/ML IV SOLN
500.0000 [IU] | Freq: Once | INTRAVENOUS | Status: AC | PRN
Start: 2023-10-16 — End: 2023-10-16
  Administered 2023-10-16: 500 [IU]

## 2023-10-16 MED ORDER — SODIUM CHLORIDE 0.9 % IV SOLN
Freq: Once | INTRAVENOUS | Status: AC
Start: 2023-10-16 — End: 2023-10-16

## 2023-10-16 MED ORDER — DEXAMETHASONE SODIUM PHOSPHATE 10 MG/ML IJ SOLN
10.0000 mg | Freq: Once | INTRAMUSCULAR | Status: AC
  Administered 2023-10-16: 10 mg via INTRAVENOUS
  Filled 2023-10-16: qty 1

## 2023-10-16 MED ORDER — SODIUM CHLORIDE 0.9 % IV SOLN
100.0000 mg/m2 | Freq: Once | INTRAVENOUS | Status: AC
  Administered 2023-10-16: 240 mg via INTRAVENOUS
  Filled 2023-10-16: qty 12

## 2023-10-16 MED FILL — Fosaprepitant Dimeglumine For IV Infusion 150 MG (Base Eq): INTRAVENOUS | Qty: 5 | Status: AC

## 2023-10-16 NOTE — Patient Instructions (Signed)
 [facility]  Discharge Instructions: Thank you for choosing [facility] to provide your oncology and hematology care.   If you have a lab appointment with the Cancer Center, please go directly to the Cancer Center and check in at the registration area.   Wear comfortable clothing and clothing appropriate for easy access to any Portacath or PICC line.   We strive to give you quality time with your provider. You may need to reschedule your appointment if you arrive late (15 or more minutes).  Arriving late affects you and other patients whose appointments are after yours.  Also, if you miss three or more appointments without notifying the office, you may be dismissed from the clinic at the provider's discretion.      For prescription refill requests, have your pharmacy contact our office and allow 72 hours for refills to be completed.    Today you received the following chemotherapy and/or immunotherapy agents :  Cisplatin & Etoposide      To help prevent nausea and vomiting after your treatment, we encourage you to take your nausea medication as directed.  BELOW ARE SYMPTOMS THAT SHOULD BE REPORTED IMMEDIATELY: *FEVER GREATER THAN 100.4 F (38 C) OR HIGHER *CHILLS OR SWEATING *NAUSEA AND VOMITING THAT IS NOT CONTROLLED WITH YOUR NAUSEA MEDICATION *UNUSUAL SHORTNESS OF BREATH *UNUSUAL BRUISING OR BLEEDING *URINARY PROBLEMS (pain or burning when urinating, or frequent urination) *BOWEL PROBLEMS (unusual diarrhea, constipation, pain near the anus) TENDERNESS IN MOUTH AND THROAT WITH OR WITHOUT PRESENCE OF ULCERS (sore throat, sores in mouth, or a toothache) UNUSUAL RASH, SWELLING OR PAIN  UNUSUAL VAGINAL DISCHARGE OR ITCHING   Items with * indicate a potential emergency and should be followed up as soon as possible or go to the Emergency Department if any problems should occur.  Please show the CHEMOTHERAPY ALERT CARD or  IMMUNOTHERAPY ALERT CARD at check-in to the Emergency Department and triage nurse.  Should you have questions after your visit or need to cancel or reschedule your appointment, please contact [facility]  Dept: [phone]  and follow the prompts.  Office hours are 8:00 a.m. to 4:30 p.m. Monday - Friday. Please note that voicemails left after 4:00 p.m. may not be returned until the following business day.  We are closed weekends and major holidays. You have access to a nurse at all times for urgent questions. Please call the main number to the clinic Dept: [phone] and follow the prompts.   For any non-urgent questions, you may also contact your provider using MyChart. We now offer e-Visits for anyone 49 and older to request care online for non-urgent symptoms. For details visit [email or web address].   Also download the MyChart app! Go to the app store, search "MyChart", open the app, select [place], and log in with your MyChart username and password.

## 2023-10-17 ENCOUNTER — Inpatient Hospital Stay: Payer: 59

## 2023-10-17 VITALS — BP 126/81 | HR 82 | Temp 97.6°F | Resp 18

## 2023-10-17 DIAGNOSIS — Z5111 Encounter for antineoplastic chemotherapy: Secondary | ICD-10-CM | POA: Diagnosis not present

## 2023-10-17 DIAGNOSIS — C6292 Malignant neoplasm of left testis, unspecified whether descended or undescended: Secondary | ICD-10-CM

## 2023-10-17 MED ORDER — SODIUM CHLORIDE 0.9 % IV SOLN
20.0000 mg/m2 | Freq: Once | INTRAVENOUS | Status: AC
  Administered 2023-10-17: 50 mg via INTRAVENOUS
  Filled 2023-10-17: qty 50

## 2023-10-17 MED ORDER — SODIUM CHLORIDE 0.9 % IV SOLN: Freq: Once | INTRAVENOUS | Status: AC

## 2023-10-17 MED ORDER — HEPARIN SOD (PORK) LOCK FLUSH 100 UNIT/ML IV SOLN
500.0000 [IU] | Freq: Once | INTRAVENOUS | Status: AC | PRN
  Administered 2023-10-17: 500 [IU]

## 2023-10-17 MED ORDER — SODIUM CHLORIDE 0.9% FLUSH
10.0000 mL | INTRAVENOUS | Status: DC | PRN
  Administered 2023-10-17: 10 mL

## 2023-10-17 MED ORDER — SODIUM CHLORIDE 0.9 % IV SOLN
100.0000 mg/m2 | Freq: Once | INTRAVENOUS | Status: AC
  Administered 2023-10-17: 240 mg via INTRAVENOUS
  Filled 2023-10-17: qty 12

## 2023-10-17 MED ORDER — DEXAMETHASONE SODIUM PHOSPHATE 10 MG/ML IJ SOLN
10.0000 mg | Freq: Once | INTRAMUSCULAR | Status: AC
  Administered 2023-10-17: 10 mg via INTRAVENOUS
  Filled 2023-10-17: qty 1

## 2023-10-17 MED ORDER — PALONOSETRON HCL INJECTION 0.25 MG/5ML
0.2500 mg | Freq: Once | INTRAVENOUS | Status: AC
Start: 2023-10-17 — End: 2023-10-17
  Administered 2023-10-17: 0.25 mg via INTRAVENOUS
  Filled 2023-10-17: qty 5

## 2023-10-17 MED ORDER — SODIUM CHLORIDE 0.9 % IV SOLN
150.0000 mg | Freq: Once | INTRAVENOUS | Status: AC
  Administered 2023-10-17: 150 mg via INTRAVENOUS
  Filled 2023-10-17: qty 150

## 2023-10-17 MED ORDER — MAGNESIUM SULFATE 2 GM/50ML IV SOLN
2.0000 g | Freq: Once | INTRAVENOUS | Status: AC
Start: 2023-10-17 — End: 2023-10-17
  Administered 2023-10-17: 2 g via INTRAVENOUS
  Filled 2023-10-17: qty 50

## 2023-10-17 MED ORDER — POTASSIUM CHLORIDE IN NACL 20-0.9 MEQ/L-% IV SOLN
Freq: Once | INTRAVENOUS | Status: AC
Start: 2023-10-17 — End: 2023-10-17
  Filled 2023-10-17: qty 1000

## 2023-10-17 MED ORDER — PROCHLORPERAZINE MALEATE 10 MG PO TABS
10.0000 mg | ORAL_TABLET | Freq: Once | ORAL | Status: AC
Start: 2023-10-17 — End: 2023-10-17
  Administered 2023-10-17: 10 mg via ORAL
  Filled 2023-10-17: qty 1

## 2023-10-17 NOTE — Patient Instructions (Signed)
 [facility]  Discharge Instructions: Thank you for choosing [facility] to provide your oncology and hematology care.   If you have a lab appointment with the Cancer Center, please go directly to the Cancer Center and check in at the registration area.   Wear comfortable clothing and clothing appropriate for easy access to any Portacath or PICC line.   We strive to give you quality time with your provider. You may need to reschedule your appointment if you arrive late (15 or more minutes).  Arriving late affects you and other patients whose appointments are after yours.  Also, if you miss three or more appointments without notifying the office, you may be dismissed from the clinic at the provider's discretion.      For prescription refill requests, have your pharmacy contact our office and allow 72 hours for refills to be completed.    Today you received the following chemotherapy and/or immunotherapy agents: Cisplatin, Etoposide.       To help prevent nausea and vomiting after your treatment, we encourage you to take your nausea medication as directed.  BELOW ARE SYMPTOMS THAT SHOULD BE REPORTED IMMEDIATELY: *FEVER GREATER THAN 100.4 F (38 C) OR HIGHER *CHILLS OR SWEATING *NAUSEA AND VOMITING THAT IS NOT CONTROLLED WITH YOUR NAUSEA MEDICATION *UNUSUAL SHORTNESS OF BREATH *UNUSUAL BRUISING OR BLEEDING *URINARY PROBLEMS (pain or burning when urinating, or frequent urination) *BOWEL PROBLEMS (unusual diarrhea, constipation, pain near the anus) TENDERNESS IN MOUTH AND THROAT WITH OR WITHOUT PRESENCE OF ULCERS (sore throat, sores in mouth, or a toothache) UNUSUAL RASH, SWELLING OR PAIN  UNUSUAL VAGINAL DISCHARGE OR ITCHING   Items with * indicate a potential emergency and should be followed up as soon as possible or go to the Emergency Department if any problems should occur.  Please show the CHEMOTHERAPY ALERT CARD or  IMMUNOTHERAPY ALERT CARD at check-in to the Emergency Department and triage nurse.  Should you have questions after your visit or need to cancel or reschedule your appointment, please contact [facility]  Dept: [phone]  and follow the prompts.  Office hours are 8:00 a.m. to 4:30 p.m. Monday - Friday. Please note that voicemails left after 4:00 p.m. may not be returned until the following business day.  We are closed weekends and major holidays. You have access to a nurse at all times for urgent questions. Please call the main number to the clinic Dept: [phone] and follow the prompts.   For any non-urgent questions, you may also contact your provider using MyChart. We now offer e-Visits for anyone 37 and older to request care online for non-urgent symptoms. For details visit [email or web address].   Also download the MyChart app! Go to the app store, search "MyChart", open the app, select [place], and log in with your MyChart username and password.

## 2023-10-18 ENCOUNTER — Inpatient Hospital Stay: Payer: 59

## 2023-10-18 VITALS — BP 126/81 | HR 88 | Temp 97.8°F | Resp 18

## 2023-10-18 DIAGNOSIS — C6292 Malignant neoplasm of left testis, unspecified whether descended or undescended: Secondary | ICD-10-CM

## 2023-10-18 DIAGNOSIS — Z5111 Encounter for antineoplastic chemotherapy: Secondary | ICD-10-CM | POA: Diagnosis not present

## 2023-10-18 MED ORDER — DEXAMETHASONE SODIUM PHOSPHATE 10 MG/ML IJ SOLN
10.0000 mg | Freq: Once | INTRAMUSCULAR | Status: AC
  Administered 2023-10-18: 10 mg via INTRAVENOUS
  Filled 2023-10-18: qty 1

## 2023-10-18 MED ORDER — SODIUM CHLORIDE 0.9% FLUSH: 10.0000 mL | INTRAVENOUS | Status: DC | PRN

## 2023-10-18 MED ORDER — POTASSIUM CHLORIDE IN NACL 20-0.9 MEQ/L-% IV SOLN
Freq: Once | INTRAVENOUS | Status: AC
  Filled 2023-10-18: qty 1000

## 2023-10-18 MED ORDER — SODIUM CHLORIDE 0.9 % IV SOLN
20.0000 mg/m2 | Freq: Once | INTRAVENOUS | Status: AC
  Administered 2023-10-18: 50 mg via INTRAVENOUS
  Filled 2023-10-18: qty 50

## 2023-10-18 MED ORDER — SODIUM CHLORIDE 0.9 % IV SOLN: Freq: Once | INTRAVENOUS | Status: AC

## 2023-10-18 MED ORDER — SODIUM CHLORIDE 0.9 % IV SOLN
100.0000 mg/m2 | Freq: Once | INTRAVENOUS | Status: AC
  Administered 2023-10-18: 240 mg via INTRAVENOUS
  Filled 2023-10-18: qty 12

## 2023-10-18 MED ORDER — MAGNESIUM SULFATE 2 GM/50ML IV SOLN
2.0000 g | Freq: Once | INTRAVENOUS | Status: AC
  Administered 2023-10-18: 2 g via INTRAVENOUS
  Filled 2023-10-18: qty 50

## 2023-10-18 MED ORDER — HEPARIN SOD (PORK) LOCK FLUSH 100 UNIT/ML IV SOLN: 500.0000 [IU] | Freq: Once | INTRAVENOUS | Status: DC | PRN

## 2023-10-18 MED FILL — Fosaprepitant Dimeglumine For IV Infusion 150 MG (Base Eq): INTRAVENOUS | Qty: 5 | Status: AC

## 2023-10-19 ENCOUNTER — Inpatient Hospital Stay: Payer: 59

## 2023-10-19 VITALS — BP 130/83 | HR 83 | Temp 98.6°F | Resp 18

## 2023-10-19 DIAGNOSIS — Z5111 Encounter for antineoplastic chemotherapy: Secondary | ICD-10-CM | POA: Diagnosis not present

## 2023-10-19 DIAGNOSIS — C6292 Malignant neoplasm of left testis, unspecified whether descended or undescended: Secondary | ICD-10-CM

## 2023-10-19 MED ORDER — SODIUM CHLORIDE 0.9 % IV SOLN
Freq: Once | INTRAVENOUS | Status: AC
Start: 2023-10-19 — End: 2023-10-19

## 2023-10-19 MED ORDER — DEXAMETHASONE SODIUM PHOSPHATE 10 MG/ML IJ SOLN
10.0000 mg | Freq: Once | INTRAMUSCULAR | Status: AC
  Administered 2023-10-19: 10 mg via INTRAVENOUS
  Filled 2023-10-19: qty 1

## 2023-10-19 MED ORDER — SODIUM CHLORIDE 0.9 % IV SOLN
30.0000 [IU] | Freq: Once | INTRAVENOUS | Status: AC
  Administered 2023-10-19: 30 [IU] via INTRAVENOUS
  Filled 2023-10-19: qty 10

## 2023-10-19 MED ORDER — MAGNESIUM SULFATE 2 GM/50ML IV SOLN
2.0000 g | Freq: Once | INTRAVENOUS | Status: AC
  Administered 2023-10-19: 2 g via INTRAVENOUS
  Filled 2023-10-19: qty 50

## 2023-10-19 MED ORDER — PALONOSETRON HCL INJECTION 0.25 MG/5ML
0.2500 mg | Freq: Once | INTRAVENOUS | Status: AC
  Administered 2023-10-19: 0.25 mg via INTRAVENOUS
  Filled 2023-10-19: qty 5

## 2023-10-19 MED ORDER — HEPARIN SOD (PORK) LOCK FLUSH 100 UNIT/ML IV SOLN
500.0000 [IU] | Freq: Once | INTRAVENOUS | Status: AC | PRN
  Administered 2023-10-19: 500 [IU]

## 2023-10-19 MED ORDER — POTASSIUM CHLORIDE IN NACL 20-0.9 MEQ/L-% IV SOLN
Freq: Once | INTRAVENOUS | Status: AC
  Filled 2023-10-19: qty 1000

## 2023-10-19 MED ORDER — SODIUM CHLORIDE 0.9% FLUSH
10.0000 mL | INTRAVENOUS | Status: DC | PRN
  Administered 2023-10-19: 10 mL

## 2023-10-19 MED ORDER — FOSAPREPITANT DIMEGLUMINE INJECTION 150 MG
150.0000 mg | Freq: Once | INTRAVENOUS | Status: AC
  Administered 2023-10-19: 150 mg via INTRAVENOUS
  Filled 2023-10-19: qty 150

## 2023-10-19 MED ORDER — SODIUM CHLORIDE 0.9 % IV SOLN
20.0000 mg/m2 | Freq: Once | INTRAVENOUS | Status: AC
  Administered 2023-10-19: 50 mg via INTRAVENOUS
  Filled 2023-10-19: qty 50

## 2023-10-19 NOTE — Patient Instructions (Signed)
 [facility]  Discharge Instructions: Thank you for choosing [facility] to provide your oncology and hematology care.   If you have a lab appointment with the Cancer Center, please go directly to the Cancer Center and check in at the registration area.   Wear comfortable clothing and clothing appropriate for easy access to any Portacath or PICC line.   We strive to give you quality time with your provider. You may need to reschedule your appointment if you arrive late (15 or more minutes).  Arriving late affects you and other patients whose appointments are after yours.  Also, if you miss three or more appointments without notifying the office, you may be dismissed from the clinic at the provider's discretion.      For prescription refill requests, have your pharmacy contact our office and allow 72 hours for refills to be completed.    Today you received the following chemotherapy and/or immunotherapy agents: Bleomycin and Cisplatin      To help prevent nausea and vomiting after your treatment, we encourage you to take your nausea medication as directed.  BELOW ARE SYMPTOMS THAT SHOULD BE REPORTED IMMEDIATELY: *FEVER GREATER THAN 100.4 F (38 C) OR HIGHER *CHILLS OR SWEATING *NAUSEA AND VOMITING THAT IS NOT CONTROLLED WITH YOUR NAUSEA MEDICATION *UNUSUAL SHORTNESS OF BREATH *UNUSUAL BRUISING OR BLEEDING *URINARY PROBLEMS (pain or burning when urinating, or frequent urination) *BOWEL PROBLEMS (unusual diarrhea, constipation, pain near the anus) TENDERNESS IN MOUTH AND THROAT WITH OR WITHOUT PRESENCE OF ULCERS (sore throat, sores in mouth, or a toothache) UNUSUAL RASH, SWELLING OR PAIN  UNUSUAL VAGINAL DISCHARGE OR ITCHING   Items with * indicate a potential emergency and should be followed up as soon as possible or go to the Emergency Department if any problems should occur.  Please show the CHEMOTHERAPY ALERT CARD or  IMMUNOTHERAPY ALERT CARD at check-in to the Emergency Department and triage nurse.  Should you have questions after your visit or need to cancel or reschedule your appointment, please contact [facility]  Dept: [phone]  and follow the prompts.  Office hours are 8:00 a.m. to 4:30 p.m. Monday - Friday. Please note that voicemails left after 4:00 p.m. may not be returned until the following business day.  We are closed weekends and major holidays. You have access to a nurse at all times for urgent questions. Please call the main number to the clinic Dept: [phone] and follow the prompts.   For any non-urgent questions, you may also contact your provider using MyChart. We now offer e-Visits for anyone 52 and older to request care online for non-urgent symptoms. For details visit [email or web address].   Also download the MyChart app! Go to the app store, search "MyChart", open the app, select [place], and log in with your MyChart username and password.

## 2023-10-22 NOTE — Assessment & Plan Note (Signed)
 Will proceed with bleomycin.  Will monitor pancytopenia closely

## 2023-10-22 NOTE — Progress Notes (Unsigned)
 Patient Care Team: Patient, [name] - General (General Practice)  Clinic Day:  10/23/2023  Referring physician: [name], MD  ASSESSMENT & PLAN:   Assessment & Plan: 33 y.o. male with diagnosis of left seminoma s/p orchiectomy presented for follow up for treatment of testicular cancer undergoing BEP   Diagnosis: pT2 [id number] normal TM. 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  MRI showed benign adrenal nodule.   Treatment: Status post cycle 1 BEP course complicated by severe thrombocytopenia and recurrent PE/DVT.  Patient had several delays and no-shows due to weather and personal reasons. Currently C3 week 2.   Patient is doing well. Discussed close follow-up during week 2 and 3 for thrombocytopenia, bleeding.  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 Follow-up for thrombocytopenia twice weekly  Transfuse platelet to keep >50K  Nausea without vomiting Take Compazine twice daily on days of chemotherapy as prophylaxis  Thrombocytopenia (HCC) Keep platelet >50K on apixaban Transfuse 1 unit of platelet if 50 or less. Labs ordered for this Friday, cbc and sample to blood bank.   The patient understands the plans discussed today and is in agreement with them.  He knows to contact our office if he develops concerns prior to his next appointment.  [name], MD   CANCER CENTER [facility] - [facility] [address] [place] [address] Dept: [phone] Dept Fax: [phone]   Orders Placed This Encounter  Procedures   CBC with Differential (Cancer Center Only)    Standing Status:   Future    Expiration Date:   10/22/2024   Sample to Blood Bank    Standing Status:   Future    Expiration Date:   10/22/2024       CHIEF COMPLAINT:  CC: Seminoma  Current Treatment: BEP  INTERVAL HISTORY:  Steve Stewart is here today for repeat clinical assessment. He denies fevers or chills.  No chest pain. Appetite is good.  No bleeding, bloody stool or dark stool or bloody urine from anticoagulation  I have reviewed the past medical history, past surgical history, social history and family history with the patient and they are unchanged from previous note.  ALLERGIES:  has no known allergies.  MEDICATIONS:  Current Outpatient Medications  Medication Sig Dispense Refill   acetaminophen (TYLENOL) 650 MG CR tablet Take 1,300 mg by mouth every 8 (eight) hours as needed for pain.     albuterol (VENTOLIN HFA) 108 (90 Base) MCG/ACT inhaler Inhale 1 puff into the lungs every 6 (six) hours as needed for wheezing or shortness of breath.     ALPRAZolam (XANAX) 0.25 MG tablet Take 1 tablet (0.25 mg total) by mouth at bedtime as needed for anxiety or sleep. (Patient not taking: Reported on 09/24/2023) 5 tablet 0   apixaban (ELIQUIS) 5 MG TABS tablet Take 1 tablet (5 mg total) by mouth 2 (two) times daily. 180 tablet 0   bictegravir-emtricitabine-tenofovir AF (BIKTARVY) 50-200-25 MG TABS tablet Take 1 tablet by mouth daily. 30 tablet 2   dexamethasone (DECADRON) 4 MG tablet Take 2 tablets (8 mg total) by mouth daily. Start the day after last cisplatin dose on day 6 of chemo x 3 days.Take with food. (Patient not taking: Reported on 09/24/2023) 30 tablet  1   docusate sodium (COLACE) 100 MG capsule Take 2 capsules (200 mg total) by mouth 2 (two) times daily. 20 capsule 0   famotidine (PEPCID) 20 MG tablet Take 1 tablet (20 mg total) by mouth 2 (two) times daily. 30 tablet 2   lidocaine (XYLOCAINE) 2 % solution Use as directed 15 mLs in the mouth or throat every 3 (three) hours as needed for mouth pain. Mix 1 to 1 ratio with Maalox to rinse for pain 100 mL 0   lidocaine-prilocaine (EMLA) cream Apply to affected area once 30 g 3   Melatonin  10 MG TABS Take 1 tablet by mouth at bedtime. 30 tablet 0   oxyCODONE-acetaminophen (PERCOCET) 10-325 MG tablet Take 1 tablet by mouth every 4 (four) hours as needed for pain. (Patient not taking: Reported on 09/24/2023) 30 tablet 0   polyethylene glycol (MIRALAX / GLYCOLAX) 17 g packet Take 17 g by mouth daily. 14 each 0   prochlorperazine (COMPAZINE) 10 MG tablet Take 1 tablet (10 mg total) by mouth every 6 (six) hours as needed for nausea or vomiting. 30 tablet 1   traZODone (DESYREL) 50 MG tablet Take 1 tablet (50 mg total) by mouth at bedtime. 90 tablet 1   No current facility-administered medications for this visit.    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   04/28/2023 Imaging   [place] 1. Positive for abnormally enlarged and heterogeneous but vascular Left Testis highly suspicious for Testicular Neoplasm. Recommend Urology consultation. 2. Negative for testicular torsion.  Right testis appears normal.   04/28/2023 Imaging   CT CAP 1. Partially visualized thickening of the skin of the left scrotum. 2. Prominent bilateral axillary lymph nodes, right greater than left. Suspect at least 2 lymph nodes with abnormal cortical thickening in the right axilla. 3. Shotty retroperitoneal, bilateral iliac chain lymph nodes measuring up to 1.1 cm in short axis. 4. Bilateral inguinal lymph nodes with borderline thickened left inguinal lymph nodes measuring up to 1.1 cm in short axis. 5. 1.2 cm right adrenal mass, indeterminate. Attention on follow-up is recommended. Alternatively evaluation with MRI may be considered. 6. Hazy attenuation within the anterior mediastinum without defined mass may represent residual thymus. Attention on follow-up is recommended. 7. Cholelithiasis. 8.  L4-L5 bilateral pars articularis defect. 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 10. Further evaluation with axillary and inguinal ultrasound may be considered.   05/02/2023 Surgery   TESTICLE, LEFT, ORCHIECTOMY:  Seminoma, size 6.1 cm  Tumor limited to testis with rete testis and lymphovascular invasion  (pT2)  Germ cell neoplasia in situ is present  Spermatic cord and all margins of resection are negative for tumor     06/25/2023 Imaging   CT AP IMPRESSION: 1. Stable mild periportal, bilateral inguinal, bilateral external iliac  and bilateral common iliac lymphadenopathy. 2. No new or progressive metastatic disease in the abdomen or pelvis. 3. Solid peripheral basilar 2.0 cm right lower lobe pulmonary nodule, new from 04/28/2023 CT, similar to recent 06/22/2023 chest CT, favoring evolving pulmonary infarct related to recent diagnosis of acute pulmonary embolism. Additional patchy consolidation at the posterior right lung base seen on prior chest CT has resolved. 4. Cholelithiasis. 5. Stable bilateral adrenal nodules characterized as benign adenomas on recent MRI,  requiring no further imaging follow-up.   07/09/2023 -  Chemotherapy   Patient is on Treatment Plan : TESTICULAR BEP q21d x 3 Cycles         REVIEW OF SYSTEMS:   All relevant systems were reviewed with the patient and are negative.   VITALS:  Blood pressure (!) 105/90, pulse 84, temperature 98.1 F (36.7 C), temperature source Oral, resp. rate 18, weight 285 lb 12.8 oz (129.6 kg), SpO2 98%.  Wt Readings from Last 3 Encounters:  10/23/23 285 lb 12.8 oz (129.6 kg)  10/16/23 289 lb 12 oz (131.4 kg)  10/15/23 287 lb 12 oz (130.5 kg)    Body mass index is 39.86 kg/m.  Performance status (ECOG): 0 - Asymptomatic  PHYSICAL EXAM:   GENERAL:alert, no distress and comfortable SKIN: skin color normal, no rashes  EYES: normal, sclera clear LUNGS: clear to auscultation with normal breathing  effort.  No wheeze or rales HEART: regular rate & rhythm and no murmurs and no lower extremity edema ABDOMEN: abdomen soft, non-tender and nondistended Musculoskeletal: no edema  LABORATORY DATA:  I have reviewed the data as listed    Component Value Date/Time   NA 135 10/23/2023 0849   K 4.4 10/23/2023 0849   CL 104 10/23/2023 0849   CO2 26 10/23/2023 0849   GLUCOSE 102 (H) 10/23/2023 0849   BUN 23 (H) 10/23/2023 0849   CREATININE 0.91 10/23/2023 0849   CALCIUM 8.7 (L) 10/23/2023 0849   PROT 6.2 (L) 10/23/2023 0849   ALBUMIN 3.9 10/23/2023 0849   AST 11 (L) 10/23/2023 0849   ALT 28 10/23/2023 0849   ALKPHOS 43 10/23/2023 0849   BILITOT 0.3 10/23/2023 0849   GFRNONAA >60 10/23/2023 0849   GFRAA >90 06/16/2012 0048    No results found for: "SPEP", "UPEP"  Lab Results  Component Value Date   WBC 4.0 10/23/2023   NEUTROABS 3.0 10/15/2023   HGB 12.0 (L) 10/23/2023   HCT 35.1 (L) 10/23/2023   MCV 86.2 10/23/2023   PLT 97 (L) 10/23/2023      Chemistry      Component Value Date/Time   NA 135 10/23/2023 0849   K 4.4 10/23/2023 0849   CL 104 10/23/2023 0849   CO2 26 10/23/2023 0849   BUN 23 (H) 10/23/2023 0849   CREATININE 0.91 10/23/2023 0849      Component Value Date/Time   CALCIUM 8.7 (L) 10/23/2023 0849   ALKPHOS 43 10/23/2023 0849   AST 11 (L) 10/23/2023 0849   ALT 28 10/23/2023 0849   BILITOT 0.3 10/23/2023 0849       RADIOGRAPHIC STUDIES: I have personally reviewed the radiological images as listed and agreed with the findings in the report. No results found.

## 2023-10-23 ENCOUNTER — Inpatient Hospital Stay (HOSPITAL_BASED_OUTPATIENT_CLINIC_OR_DEPARTMENT_OTHER): Payer: 59

## 2023-10-23 ENCOUNTER — Ambulatory Visit: Payer: 59

## 2023-10-23 ENCOUNTER — Inpatient Hospital Stay: Payer: 59

## 2023-10-23 ENCOUNTER — Other Ambulatory Visit: Payer: 59

## 2023-10-23 VITALS — BP 105/90 | HR 84 | Temp 98.1°F | Resp 18 | Wt 285.8 lb

## 2023-10-23 DIAGNOSIS — Z86718 Personal history of other venous thrombosis and embolism: Secondary | ICD-10-CM | POA: Insufficient documentation

## 2023-10-23 DIAGNOSIS — Z7901 Long term (current) use of anticoagulants: Secondary | ICD-10-CM | POA: Diagnosis not present

## 2023-10-23 DIAGNOSIS — D696 Thrombocytopenia, unspecified: Secondary | ICD-10-CM | POA: Diagnosis not present

## 2023-10-23 DIAGNOSIS — C6292 Malignant neoplasm of left testis, unspecified whether descended or undescended: Secondary | ICD-10-CM

## 2023-10-23 DIAGNOSIS — R110 Nausea: Secondary | ICD-10-CM | POA: Diagnosis not present

## 2023-10-23 DIAGNOSIS — Z5111 Encounter for antineoplastic chemotherapy: Secondary | ICD-10-CM | POA: Insufficient documentation

## 2023-10-23 DIAGNOSIS — I2609 Other pulmonary embolism with acute cor pulmonale: Secondary | ICD-10-CM | POA: Diagnosis not present

## 2023-10-23 LAB — CBC (CANCER CENTER ONLY)
HCT: 35.1 % — ABNORMAL LOW (ref 39.0–52.0)
Hemoglobin: 12 g/dL — ABNORMAL LOW (ref 13.0–17.0)
MCH: 29.5 pg (ref 26.0–34.0)
MCHC: 34.2 g/dL (ref 30.0–36.0)
MCV: 86.2 fL (ref 80.0–100.0)
Platelet Count: 97 10*3/uL — ABNORMAL LOW (ref 150–400)
RBC: 4.07 MIL/uL — ABNORMAL LOW (ref 4.22–5.81)
RDW: 14.9 % (ref 11.5–15.5)
WBC Count: 4 10*3/uL (ref 4.0–10.5)
nRBC: 0 % (ref 0.0–0.2)

## 2023-10-23 LAB — CMP (CANCER CENTER ONLY)
ALT: 28 U/L (ref 0–44)
AST: 11 U/L — ABNORMAL LOW (ref 15–41)
Albumin: 3.9 g/dL (ref 3.5–5.0)
Alkaline Phosphatase: 43 U/L (ref 38–126)
Anion gap: 5 (ref 5–15)
BUN: 23 mg/dL — ABNORMAL HIGH (ref 6–20)
CO2: 26 mmol/L (ref 22–32)
Calcium: 8.7 mg/dL — ABNORMAL LOW (ref 8.9–10.3)
Chloride: 104 mmol/L (ref 98–111)
Creatinine: 0.91 mg/dL (ref 0.61–1.24)
GFR, Estimated: >60 mL/min (ref >60)
Glucose, Bld: 102 mg/dL — ABNORMAL HIGH (ref 70–99)
Potassium: 4.4 mmol/L (ref 3.5–5.1)
Sodium: 135 mmol/L (ref 135–145)
Total Bilirubin: 0.3 mg/dL (ref 0.0–1.2)
Total Protein: 6.2 g/dL — ABNORMAL LOW (ref 6.5–8.1)

## 2023-10-23 LAB — SAMPLE TO BLOOD BANK

## 2023-10-23 MED ORDER — HEPARIN SOD (PORK) LOCK FLUSH 100 UNIT/ML IV SOLN
500.0000 [IU] | Freq: Once | INTRAVENOUS | Status: AC
  Administered 2023-10-23: 500 [IU]

## 2023-10-23 MED ORDER — SODIUM CHLORIDE 0.9% FLUSH
10.0000 mL | Freq: Once | INTRAVENOUS | Status: AC
  Administered 2023-10-23: 10 mL

## 2023-10-23 NOTE — Assessment & Plan Note (Signed)
 Take Compazine twice daily on days of chemotherapy as prophylaxis

## 2023-10-23 NOTE — Assessment & Plan Note (Signed)
 Continue apixaban 5 mg twice daily. Follow-up for thrombocytopenia twice weekly  Transfuse platelet to keep >50K

## 2023-10-23 NOTE — Assessment & Plan Note (Signed)
 Keep platelet >50K on apixaban Transfuse 1 unit of platelet if 50 or less. Labs ordered for this Friday, cbc and sample to blood bank.

## 2023-10-24 ENCOUNTER — Ambulatory Visit: Payer: 59

## 2023-10-24 ENCOUNTER — Inpatient Hospital Stay: Payer: 59

## 2023-10-26 ENCOUNTER — Inpatient Hospital Stay

## 2023-10-26 ENCOUNTER — Other Ambulatory Visit: Payer: 59

## 2023-10-26 ENCOUNTER — Inpatient Hospital Stay: Payer: 59

## 2023-10-26 ENCOUNTER — Other Ambulatory Visit: Payer: Self-pay

## 2023-10-26 VITALS — BP 110/75 | HR 73 | Temp 98.2°F | Resp 16 | Wt 285.6 lb

## 2023-10-26 DIAGNOSIS — Z5111 Encounter for antineoplastic chemotherapy: Secondary | ICD-10-CM | POA: Diagnosis not present

## 2023-10-26 DIAGNOSIS — D696 Thrombocytopenia, unspecified: Secondary | ICD-10-CM

## 2023-10-26 DIAGNOSIS — C6292 Malignant neoplasm of left testis, unspecified whether descended or undescended: Secondary | ICD-10-CM

## 2023-10-26 LAB — CMP (CANCER CENTER ONLY)
ALT: 19 U/L (ref 0–44)
AST: 10 U/L — ABNORMAL LOW (ref 15–41)
Albumin: 3.9 g/dL (ref 3.5–5.0)
Alkaline Phosphatase: 45 U/L (ref 38–126)
Anion gap: 4 — ABNORMAL LOW (ref 5–15)
BUN: 19 mg/dL (ref 6–20)
CO2: 25 mmol/L (ref 22–32)
Calcium: 8.4 mg/dL — ABNORMAL LOW (ref 8.9–10.3)
Chloride: 108 mmol/L (ref 98–111)
Creatinine: 0.89 mg/dL (ref 0.61–1.24)
GFR, Estimated: >60 mL/min (ref >60)
Glucose, Bld: 91 mg/dL (ref 70–99)
Potassium: 4.6 mmol/L (ref 3.5–5.1)
Sodium: 137 mmol/L (ref 135–145)
Total Bilirubin: 0.2 mg/dL (ref 0.0–1.2)
Total Protein: 6.2 g/dL — ABNORMAL LOW (ref 6.5–8.1)

## 2023-10-26 LAB — CBC WITH DIFFERENTIAL (CANCER CENTER ONLY)
Abs Immature Granulocytes: 0.02 10*3/uL (ref 0.00–0.07)
Basophils Absolute: 0 10*3/uL (ref 0.0–0.1)
Basophils Relative: 0 %
Eosinophils Absolute: 0.1 10*3/uL (ref 0.0–0.5)
Eosinophils Relative: 4 %
HCT: 34 % — ABNORMAL LOW (ref 39.0–52.0)
Hemoglobin: 11.4 g/dL — ABNORMAL LOW (ref 13.0–17.0)
Immature Granulocytes: 1 %
Lymphocytes Relative: 31 %
Lymphs Abs: 1.1 10*3/uL (ref 0.7–4.0)
MCH: 29.9 pg (ref 26.0–34.0)
MCHC: 33.5 g/dL (ref 30.0–36.0)
MCV: 89.2 fL (ref 80.0–100.0)
Monocytes Absolute: 0.4 10*3/uL (ref 0.1–1.0)
Monocytes Relative: 11 %
Neutro Abs: 1.9 10*3/uL (ref 1.7–7.7)
Neutrophils Relative %: 53 %
Platelet Count: 43 10*3/uL — ABNORMAL LOW (ref 150–400)
RBC: 3.81 MIL/uL — ABNORMAL LOW (ref 4.22–5.81)
RDW: 14.8 % (ref 11.5–15.5)
WBC Count: 3.5 10*3/uL — ABNORMAL LOW (ref 4.0–10.5)
nRBC: 0 % (ref 0.0–0.2)

## 2023-10-26 LAB — TYPE AND SCREEN
ABO/RH(D): A POS
Antibody Screen: NEGATIVE

## 2023-10-26 LAB — SAMPLE TO BLOOD BANK

## 2023-10-26 MED ORDER — SODIUM CHLORIDE 0.9% FLUSH
10.0000 mL | Freq: Once | INTRAVENOUS | Status: AC
Start: 2023-10-26 — End: 2023-10-26
  Administered 2023-10-26: 10 mL

## 2023-10-26 MED ORDER — SODIUM CHLORIDE 0.9 % IV SOLN: Freq: Once | INTRAVENOUS | Status: AC

## 2023-10-26 MED ORDER — SODIUM CHLORIDE 0.9 % IV SOLN
30.0000 [IU] | Freq: Once | INTRAVENOUS | Status: AC
  Administered 2023-10-26: 30 [IU] via INTRAVENOUS
  Filled 2023-10-26: qty 10

## 2023-10-26 MED ORDER — SODIUM CHLORIDE 0.9% IV SOLUTION
250.0000 mL | INTRAVENOUS | Status: DC
  Administered 2023-10-26: 100 mL via INTRAVENOUS

## 2023-10-26 NOTE — Progress Notes (Signed)
 Needs one U of platelet

## 2023-10-29 LAB — BPAM PLATELET PHERESIS
Blood Product Expiration Date: 202503102359
ISSUE DATE / TIME: 202503071035
Unit Type and Rh: 5100

## 2023-10-29 LAB — PREPARE PLATELET PHERESIS: Unit division: 0

## 2023-10-29 NOTE — Assessment & Plan Note (Signed)
 Keep platelet >50K on apixaban Transfuse 1 unit of platelet if 50 or less. Labs ordered for this Friday, cbc and sample to blood bank.

## 2023-10-29 NOTE — Assessment & Plan Note (Signed)
 Continue apixaban 5 mg twice daily. Follow-up for thrombocytopenia twice weekly until count recovery Transfuse platelet to keep >50K

## 2023-10-29 NOTE — Assessment & Plan Note (Addendum)
 Will proceed with bleomycin this week. Last dose Will monitor pancytopenia closely CT about week of 4/21

## 2023-10-29 NOTE — Progress Notes (Unsigned)
 Patient Care Team: Patient, [name] - General (General Practice)  Clinic Day:  10/30/2023  Referring physician: [name], MD  ASSESSMENT & PLAN:   Assessment & Plan: 33 y.o. male with diagnosis of left seminoma s/p orchiectomy presented for follow up for treatment of testicular cancer undergoing BEP   Diagnosis: pT2 [id number] normal TM. 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  MRI showed benign adrenal nodule.   Treatment: Status post cycle 1 BEP course complicated by severe thrombocytopenia and recurrent PE/DVT.  Patient had several delays and no-shows due to weather and personal reasons. Currently C3 week 2.   O2 saturation 99% on RA.  Patient is doing well. Discussed close follow-up during week 2 and 3 for thrombocytopenia, bleeding.  Transfuse one unit of platelet today.   Primary seminoma of left testis (HCC) Will proceed with bleomycin this week. Last dose Will monitor pancytopenia closely CT about week of 4/21  Acute pulmonary embolism with acute cor pulmonale (HCC) Continue apixaban 5 mg twice daily. Follow-up for thrombocytopenia twice weekly until count recovery Transfuse platelet to keep >50K  Thrombocytopenia (HCC) Keep platelet >50K on apixaban Transfuse 1 unit of platelet if 50 or less. Labs ordered for this Friday, cbc and sample to blood bank.   CT on weekly of 4/21 and see me on first week of May.  The patient understands the plans discussed today and is in agreement with them.  He knows to contact our office if he develops concerns prior to his next appointment.  [name], MD  [facility] [address] [place] [address] Dept: [phone] Dept Fax: [phone]   Orders Placed This Encounter  Procedures   CT CHEST ABDOMEN PELVIS W CONTRAST    Standing Status:   Future    Expected Date:   12/10/2023    Expiration  Date:   10/29/2024    If indicated for the ordered procedure, I authorize the administration of contrast media per Radiology protocol:   Yes    Does the patient have a contrast media/X-ray dye allergy?:   No    Preferred imaging location?:   [facility]    If indicated for the ordered procedure, I authorize the administration of oral contrast media per Radiology protocol:   Yes   CBC with Differential (Cancer Center Only)    Standing Status:   Standing    Number of Occurrences:   10    Expiration Date:   01/30/2024   Sample to Blood Bank    Standing Status:   Standing    Number of Occurrences:   10    Next Expected Occurrence:   11/02/2023    Expiration Date:   01/30/2024   Prepare platelet pheresis    Standing Status:   Standing    Number of Occurrences:   1    Number of Apheresis Units (1 unit of apheresis platelets will increase platelets 30,000/mL in an avg sized adult):   1 unit    Transfusion Indications:   Other    Comments:   Plt = 49    Date/Time blood product needed:   For transfusion    If emergent release call blood bank:   Not emergent release      CHIEF COMPLAINT:  CC: seminoma  Current Treatment:  BEP  INTERVAL HISTORY:  Steve Stewart is  here today for repeat clinical assessment. He denies fevers or chills, coughing, chest pain, pain on urination, bloody stool, dark stool, or bleeding.  His appetite is good. Taking apixaban twice daily.  I have reviewed the past medical history, past surgical history, social history and family history with the patient and they are unchanged from previous note.  ALLERGIES:  has no known allergies.  MEDICATIONS:  Current Outpatient Medications  Medication Sig Dispense Refill   acetaminophen (TYLENOL) 650 MG CR tablet Take 1,300 mg by mouth every 8 (eight) hours as needed for pain.     albuterol (VENTOLIN HFA) 108 (90 Base) MCG/ACT inhaler Inhale 1 puff into the lungs every 6 (six) hours as needed for wheezing or shortness of breath.      ALPRAZolam (XANAX) 0.25 MG tablet Take 1 tablet (0.25 mg total) by mouth at bedtime as needed for anxiety or sleep. (Patient not taking: Reported on 09/24/2023) 5 tablet 0   apixaban (ELIQUIS) 5 MG TABS tablet Take 1 tablet (5 mg total) by mouth 2 (two) times daily. 180 tablet 0   bictegravir-emtricitabine-tenofovir AF (BIKTARVY) 50-200-25 MG TABS tablet Take 1 tablet by mouth daily. 30 tablet 2   dexamethasone (DECADRON) 4 MG tablet Take 2 tablets (8 mg total) by mouth daily. Start the day after last cisplatin dose on day 6 of chemo x 3 days.Take with food. (Patient not taking: Reported on 09/24/2023) 30 tablet 1   docusate sodium (COLACE) 100 MG capsule Take 2 capsules (200 mg total) by mouth 2 (two) times daily. 20 capsule 0   famotidine (PEPCID) 20 MG tablet Take 1 tablet (20 mg total) by mouth 2 (two) times daily. 30 tablet 2   lidocaine (XYLOCAINE) 2 % solution Use as directed 15 mLs in the mouth or throat every 3 (three) hours as needed for mouth pain. Mix 1 to 1 ratio with Maalox to rinse for pain 100 mL 0   lidocaine-prilocaine (EMLA) cream Apply to affected area once 30 g 3   Melatonin 10 MG TABS Take 1 tablet by mouth at bedtime. 30 tablet 0   oxyCODONE-acetaminophen (PERCOCET) 10-325 MG tablet Take 1 tablet by mouth every 4 (four) hours as needed for pain. (Patient not taking: Reported on 09/24/2023) 30 tablet 0   polyethylene glycol (MIRALAX / GLYCOLAX) 17 g packet Take 17 g by mouth daily. 14 each 0   prochlorperazine (COMPAZINE) 10 MG tablet Take 1 tablet (10 mg total) by mouth every 6 (six) hours as needed for nausea or vomiting. 30 tablet 1   traZODone (DESYREL) 50 MG tablet Take 1 tablet (50 mg total) by mouth at bedtime. 90 tablet 1   No current facility-administered medications for this visit.   Facility-Administered Medications Ordered in Other Visits  Medication Dose Route Frequency Provider Last Rate Last Admin   0.9 %  sodium chloride infusion (Manually program via Guardrails  IV Fluids)  250 mL Intravenous Continuous [name], MD       heparin lock flush 100 unit/mL  500 Units Intracatheter Daily PRN [name], MD       sodium chloride flush (NS) 0.9 % injection 10 mL  10 mL Intracatheter PRN [name], MD        HISTORY OF PRESENT ILLNESS:   Oncology History  Primary seminoma of left testis (HCC)  04/28/2023 Initial Diagnosis   Primary seminoma of left testis [facility]) Presented to ED for worsening testicular pain and presented with 2 months history of testicular  mass.   04/28/2023 Imaging   [place] 1. Positive for abnormally enlarged and heterogeneous but vascular Left Testis highly suspicious for Testicular Neoplasm. Recommend Urology consultation. 2. Negative for testicular torsion.  Right testis appears normal.   04/28/2023 Imaging   CT CAP 1. Partially visualized thickening of the skin of the left scrotum. 2. Prominent bilateral axillary lymph nodes, right greater than left. Suspect at least 2 lymph nodes with abnormal cortical thickening in the right axilla. 3. Shotty retroperitoneal, bilateral iliac chain lymph nodes measuring up to 1.1 cm in short axis. 4. Bilateral inguinal lymph nodes with borderline thickened left inguinal lymph nodes measuring up to 1.1 cm in short axis. 5. 1.2 cm right adrenal mass, indeterminate. Attention on follow-up is recommended. Alternatively evaluation with MRI may be considered. 6. Hazy attenuation within the anterior mediastinum without defined mass may represent residual thymus. Attention on follow-up is recommended. 7. Cholelithiasis. 8. L4-L5 bilateral pars articularis defect. 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 10. Further evaluation with axillary and inguinal ultrasound may be considered.   05/02/2023 Surgery   TESTICLE, LEFT, ORCHIECTOMY:  Seminoma, size 6.1 cm  Tumor limited to testis with rete testis and lymphovascular invasion  (pT2)  Germ cell neoplasia in situ  is present  Spermatic cord and all margins of resection are negative for tumor     06/25/2023 Imaging   CT AP IMPRESSION: 1. Stable mild periportal, bilateral inguinal, bilateral external iliac  and bilateral common iliac lymphadenopathy. 2. No new or progressive metastatic disease in the abdomen or pelvis. 3. Solid peripheral basilar 2.0 cm right lower lobe pulmonary nodule, new from 04/28/2023 CT, similar to recent 06/22/2023 chest CT, favoring evolving pulmonary infarct related to recent diagnosis of acute pulmonary embolism. Additional patchy consolidation at the posterior right lung base seen on prior chest CT has resolved. 4. Cholelithiasis. 5. Stable bilateral adrenal nodules characterized as benign adenomas on recent MRI, requiring no further imaging follow-up.   07/09/2023 -  Chemotherapy   Patient is on Treatment Plan : TESTICULAR BEP q21d x 3 Cycles         REVIEW OF SYSTEMS:   All relevant systems were reviewed with the patient and are negative.   VITALS:  Blood pressure 116/75, pulse 69, temperature 98 F (36.7 C), temperature source Temporal, resp. rate 16, height 5\' 11"  (1.803 m), weight 288 lb 11.2 oz (131 kg), SpO2 99%.  Wt Readings from Last 3 Encounters:  10/30/23 288 lb 11.2 oz (131 kg)  10/26/23 285 lb 10 oz (129.6 kg)  10/23/23 285 lb 12.8 oz (129.6 kg)    Body mass index is 40.27 kg/m.  Performance status (ECOG): 0 - Asymptomatic  PHYSICAL EXAM:   GENERAL:alert, no distress and comfortable LUNGS: normal breathing effort.    LABORATORY DATA:  I have reviewed the data as listed    Component Value Date/Time   NA 137 10/26/2023 0803   K 4.6 10/26/2023 0803   CL 108 10/26/2023 0803   CO2 25 10/26/2023 0803   GLUCOSE 91 10/26/2023 0803   BUN 19 10/26/2023 0803   CREATININE 0.89 10/26/2023 0803   CALCIUM 8.4 (L) 10/26/2023 0803   PROT 6.2 (L) 10/26/2023 0803   ALBUMIN 3.9 10/26/2023 0803   AST 10 (L) 10/26/2023 0803   ALT 19 10/26/2023 0803    ALKPHOS 45 10/26/2023 0803   BILITOT 0.2 10/26/2023 0803   GFRNONAA >60 10/26/2023 0803   GFRAA >90 06/16/2012 0048  No results found for: "SPEP", "UPEP"  Lab Results  Component Value Date   WBC 3.0 (L) 10/30/2023   NEUTROABS 1.9 10/26/2023   HGB 11.3 (L) 10/30/2023   HCT 33.8 (L) 10/30/2023   MCV 88.0 10/30/2023   PLT 49 (L) 10/30/2023      Chemistry      Component Value Date/Time   NA 137 10/26/2023 0803   K 4.6 10/26/2023 0803   CL 108 10/26/2023 0803   CO2 25 10/26/2023 0803   BUN 19 10/26/2023 0803   CREATININE 0.89 10/26/2023 0803      Component Value Date/Time   CALCIUM 8.4 (L) 10/26/2023 0803   ALKPHOS 45 10/26/2023 0803   AST 10 (L) 10/26/2023 0803   ALT 19 10/26/2023 0803   BILITOT 0.2 10/26/2023 0803       RADIOGRAPHIC STUDIES: I have personally reviewed the radiological images as listed and agreed with the findings in the report. No results found.

## 2023-10-30 ENCOUNTER — Inpatient Hospital Stay: Payer: 59

## 2023-10-30 ENCOUNTER — Ambulatory Visit: Payer: 59

## 2023-10-30 ENCOUNTER — Other Ambulatory Visit: Payer: Self-pay

## 2023-10-30 ENCOUNTER — Other Ambulatory Visit (HOSPITAL_COMMUNITY): Payer: Self-pay

## 2023-10-30 ENCOUNTER — Telehealth: Payer: Self-pay

## 2023-10-30 ENCOUNTER — Other Ambulatory Visit: Payer: 59

## 2023-10-30 ENCOUNTER — Inpatient Hospital Stay (HOSPITAL_BASED_OUTPATIENT_CLINIC_OR_DEPARTMENT_OTHER): Payer: 59

## 2023-10-30 VITALS — BP 116/75 | HR 69 | Temp 98.0°F | Resp 16 | Ht 71.0 in | Wt 288.7 lb

## 2023-10-30 DIAGNOSIS — D696 Thrombocytopenia, unspecified: Secondary | ICD-10-CM

## 2023-10-30 DIAGNOSIS — C6292 Malignant neoplasm of left testis, unspecified whether descended or undescended: Secondary | ICD-10-CM

## 2023-10-30 DIAGNOSIS — Z95828 Presence of other vascular implants and grafts: Secondary | ICD-10-CM | POA: Diagnosis not present

## 2023-10-30 DIAGNOSIS — I2602 Saddle embolus of pulmonary artery with acute cor pulmonale: Secondary | ICD-10-CM

## 2023-10-30 DIAGNOSIS — Z5111 Encounter for antineoplastic chemotherapy: Secondary | ICD-10-CM | POA: Diagnosis not present

## 2023-10-30 LAB — CBC (CANCER CENTER ONLY)
HCT: 33.8 % — ABNORMAL LOW (ref 39.0–52.0)
Hemoglobin: 11.3 g/dL — ABNORMAL LOW (ref 13.0–17.0)
MCH: 29.4 pg (ref 26.0–34.0)
MCHC: 33.4 g/dL (ref 30.0–36.0)
MCV: 88 fL (ref 80.0–100.0)
Platelet Count: 49 10*3/uL — ABNORMAL LOW (ref 150–400)
RBC: 3.84 MIL/uL — ABNORMAL LOW (ref 4.22–5.81)
RDW: 15 % (ref 11.5–15.5)
WBC Count: 3 10*3/uL — ABNORMAL LOW (ref 4.0–10.5)
nRBC: 0 % (ref 0.0–0.2)

## 2023-10-30 LAB — SAMPLE TO BLOOD BANK

## 2023-10-30 MED ORDER — PROCHLORPERAZINE MALEATE 10 MG PO TABS
10.0000 mg | ORAL_TABLET | Freq: Once | ORAL | Status: AC
  Administered 2023-10-30: 10 mg via ORAL
  Filled 2023-10-30: qty 1

## 2023-10-30 MED ORDER — SODIUM CHLORIDE 0.9% IV SOLUTION
250.0000 mL | INTRAVENOUS | Status: DC
Start: 2023-10-30 — End: 2023-10-30
  Administered 2023-10-30: 100 mL via INTRAVENOUS

## 2023-10-30 MED ORDER — HEPARIN SOD (PORK) LOCK FLUSH 100 UNIT/ML IV SOLN
500.0000 [IU] | Freq: Every day | INTRAVENOUS | Status: AC | PRN
  Administered 2023-10-30: 500 [IU]

## 2023-10-30 MED ORDER — LIDOCAINE-PRILOCAINE 2.5-2.5 % EX CREA
TOPICAL_CREAM | CUTANEOUS | 3 refills | Status: AC
  Filled 2023-10-30 – 2023-12-31 (×2): qty 30, 1d supply, fill #0

## 2023-10-30 MED ORDER — SODIUM CHLORIDE 0.9% FLUSH
10.0000 mL | INTRAVENOUS | Status: AC | PRN
Start: 2023-10-30 — End: 2023-10-30
  Administered 2023-10-30: 10 mL

## 2023-10-30 MED ORDER — SODIUM CHLORIDE 0.9% FLUSH
10.0000 mL | Freq: Once | INTRAVENOUS | Status: AC
Start: 2023-10-30 — End: 2023-10-30
  Administered 2023-10-30: 10 mL

## 2023-10-30 NOTE — Patient Instructions (Signed)
 Platelet Transfusion A platelet transfusion is a procedure in which a person receives donated platelets through an IV. Platelets are parts of blood that stick together and form a clot to help the body stop bleeding after an injury. If you have too few platelets, your blood may have trouble clotting. This may cause you to bleed and bruise very easily. You may need a platelet transfusion if you have a condition that causes a low number of platelets (thrombocytopenia). A platelet transfusion may be used to stop or prevent excessive bleeding. Tell a health care provider about: Any reactions you have had during previous transfusions. Any allergies you have. All medicines you are taking, including vitamins, herbs, eye drops, creams, and over-the-counter medicines. Any bleeding problems you have. Any surgeries you have had. Any medical conditions you have. Whether you are pregnant or may be pregnant. What are the risks? Generally, this is a safe procedure. However, problems may occur, including: Fever. Infection. Allergic reaction to the donated (donor) platelets. Your body's disease-fighting system (immune system) attacking the donor platelets (hemolytic reaction). This is rare. A rare reaction that causes lung damage (transfusion-related acute lung injury). What happens before the procedure? Medicines Ask your health care provider about: Changing or stopping your regular medicines. This is especially important if you are taking diabetes medicines or blood thinners. Taking medicines such as aspirin and ibuprofen. These medicines can thin your blood. Do not take these medicines unless your health care provider tells you to take them. Taking over-the-counter medicines, vitamins, herbs, and supplements. General instructions You will have a blood test to determine your blood type. Your blood type determines what kind of platelets you will be given. Follow instructions from your health care provider  about eating or drinking restrictions. If you have had an allergic reaction to a transfusion in the past, you may be given medicine to help prevent a reaction. Your temperature, blood pressure, pulse, and breathing will be monitored. What happens during the procedure?  An IV will be inserted into one of your veins. For your safety, two health care providers will verify your identity along with the donor platelets about to be infused. A bag of donor platelets will be connected to your IV. The platelets will flow into your bloodstream. This usually takes 30-60 minutes. Your temperature, blood pressure, pulse, and breathing will be monitored during the transfusion. This helps detect early signs of any reaction. You will also be monitored for other symptoms that may indicate a reaction, including chills, hives, or itching. If you have signs of a reaction at any time, your transfusion will be stopped, and you may be given medicine to help manage the reaction. When your transfusion is complete, your IV will be removed. Pressure may be applied to the IV site for a few minutes to stop any bleeding. The IV site will be covered with a bandage (dressing). The procedure may vary among health care providers and hospitals. What can I expect after the procedure? Your blood pressure, temperature, pulse, and breathing will be monitored until you leave the hospital or clinic. You may have some bruising and soreness at your IV site. Follow these instructions at home: Medicines Take over-the-counter and prescription medicines only as told by your health care provider. Talk with your health care provider before you take any medicines that contain aspirin or NSAIDs, such as ibuprofen. These medicines increase your risk for dangerous bleeding. IV site care Check your IV site every day for signs of infection. Check for:  Redness, swelling, or pain. Fluid or blood. If fluid or blood drains from your IV site, use your  hands to press down firmly on a bandage covering the area for a minute or two. Doing this should stop the bleeding. Warmth. Pus or a bad smell. General instructions Change or remove your dressing as told by your health care provider. Return to your normal activities as told by your health care provider. Ask your health care provider what activities are safe for you. Do not take baths, swim, or use a hot tub until your health care provider approves. Ask your health care provider if you may take showers. Keep all follow-up visits. This is important. Contact a health care provider if: You have a headache that does not go away with medicine. You have hives, rash, or itchy skin. You have nausea or vomiting. You feel unusually tired or weak. You have signs of infection at your IV site. Get help right away if: You have a fever or chills. You urinate less often than usual. Your urine is darker colored than normal. You have any of the following: Trouble breathing. Pain in your back, abdomen, or chest. Cool, clammy skin. A fast heartbeat. Summary Platelets are tiny pieces of blood cells that clump together to form a blood clot when you have an injury. If you have too few platelets, your blood may have trouble clotting. A platelet transfusion is a procedure in which you receive donated platelets through an IV. A platelet transfusion may be used to stop or prevent excessive bleeding. After the procedure, check your IV site every day for signs of infection. This information is not intended to replace advice given to you by your health care provider. Make sure you discuss any questions you have with your health care provider. Document Revised: 03/08/2023 Document Reviewed: 02/10/2021 Elsevier Patient Education  2024 [employer].

## 2023-10-30 NOTE — Telephone Encounter (Signed)
 Scheduled patient's appts. Advised patient to contact us if rescheduling is needed. Provided my direct line.

## 2023-10-30 NOTE — Telephone Encounter (Signed)
 Left patient a voicemail in regards to changes in appointment times for 11/06/2023 and also 11/08/2023 per secure chat on 10/30/2023; left callback number for patient if having conflicts or needing to change appointments

## 2023-10-31 ENCOUNTER — Other Ambulatory Visit: Payer: Self-pay

## 2023-10-31 LAB — PREPARE PLATELET PHERESIS: Unit division: 0

## 2023-10-31 LAB — BPAM PLATELET PHERESIS
Blood Product Expiration Date: 202503132359
ISSUE DATE / TIME: 202503111131
Unit Type and Rh: 5100

## 2023-11-01 ENCOUNTER — Other Ambulatory Visit: Payer: 59

## 2023-11-02 ENCOUNTER — Other Ambulatory Visit

## 2023-11-02 ENCOUNTER — Inpatient Hospital Stay: Payer: 59

## 2023-11-02 ENCOUNTER — Inpatient Hospital Stay

## 2023-11-02 VITALS — BP 113/73 | HR 77 | Temp 97.6°F | Resp 16

## 2023-11-02 DIAGNOSIS — Z5111 Encounter for antineoplastic chemotherapy: Secondary | ICD-10-CM | POA: Diagnosis not present

## 2023-11-02 DIAGNOSIS — C6292 Malignant neoplasm of left testis, unspecified whether descended or undescended: Secondary | ICD-10-CM

## 2023-11-02 DIAGNOSIS — D696 Thrombocytopenia, unspecified: Secondary | ICD-10-CM

## 2023-11-02 LAB — CBC (CANCER CENTER ONLY)
HCT: 34 % — ABNORMAL LOW (ref 39.0–52.0)
Hemoglobin: 11.4 g/dL — ABNORMAL LOW (ref 13.0–17.0)
MCH: 30.2 pg (ref 26.0–34.0)
MCHC: 33.5 g/dL (ref 30.0–36.0)
MCV: 90.2 fL (ref 80.0–100.0)
Platelet Count: 131 10*3/uL — ABNORMAL LOW (ref 150–400)
RBC: 3.77 MIL/uL — ABNORMAL LOW (ref 4.22–5.81)
RDW: 15.2 % (ref 11.5–15.5)
WBC Count: 2.1 10*3/uL — ABNORMAL LOW (ref 4.0–10.5)
nRBC: 0 % (ref 0.0–0.2)

## 2023-11-02 LAB — CMP (CANCER CENTER ONLY)
ALT: 15 U/L (ref 0–44)
AST: 10 U/L — ABNORMAL LOW (ref 15–41)
Albumin: 3.9 g/dL (ref 3.5–5.0)
Alkaline Phosphatase: 52 U/L (ref 38–126)
Anion gap: 5 (ref 5–15)
BUN: 19 mg/dL (ref 6–20)
CO2: 25 mmol/L (ref 22–32)
Calcium: 8.4 mg/dL — ABNORMAL LOW (ref 8.9–10.3)
Chloride: 107 mmol/L (ref 98–111)
Creatinine: 1.08 mg/dL (ref 0.61–1.24)
GFR, Estimated: >60 mL/min (ref >60)
Glucose, Bld: 96 mg/dL (ref 70–99)
Potassium: 4.6 mmol/L (ref 3.5–5.1)
Sodium: 137 mmol/L (ref 135–145)
Total Bilirubin: 0.2 mg/dL (ref 0.0–1.2)
Total Protein: 6.4 g/dL — ABNORMAL LOW (ref 6.5–8.1)

## 2023-11-02 LAB — SAMPLE TO BLOOD BANK

## 2023-11-02 MED ORDER — PROCHLORPERAZINE MALEATE 10 MG PO TABS
10.0000 mg | ORAL_TABLET | Freq: Once | ORAL | Status: AC
  Administered 2023-11-02: 10 mg via ORAL
  Filled 2023-11-02: qty 1

## 2023-11-02 MED ORDER — SODIUM CHLORIDE 0.9% FLUSH
10.0000 mL | INTRAVENOUS | Status: DC | PRN
Start: 2023-11-02 — End: 2023-11-02
  Administered 2023-11-02: 10 mL

## 2023-11-02 MED ORDER — SODIUM CHLORIDE 0.9 % IV SOLN
30.0000 [IU] | Freq: Once | INTRAVENOUS | Status: AC
  Administered 2023-11-02: 30 [IU] via INTRAVENOUS
  Filled 2023-11-02: qty 10

## 2023-11-02 MED ORDER — SODIUM CHLORIDE 0.9% FLUSH
10.0000 mL | Freq: Once | INTRAVENOUS | Status: AC
  Administered 2023-11-02: 10 mL

## 2023-11-02 MED ORDER — HEPARIN SOD (PORK) LOCK FLUSH 100 UNIT/ML IV SOLN
500.0000 [IU] | Freq: Once | INTRAVENOUS | Status: AC | PRN
  Administered 2023-11-02: 500 [IU]

## 2023-11-02 MED ORDER — SODIUM CHLORIDE 0.9 % IV SOLN: Freq: Once | INTRAVENOUS | Status: AC

## 2023-11-02 NOTE — Patient Instructions (Signed)
 [facility]  Discharge Instructions: Thank you for choosing [facility] to provide your oncology and hematology care.   If you have a lab appointment with the Cancer Center, please go directly to the Cancer Center and check in at the registration area.   Wear comfortable clothing and clothing appropriate for easy access to any Portacath or PICC line.   We strive to give you quality time with your provider. You may need to reschedule your appointment if you arrive late (15 or more minutes).  Arriving late affects you and other patients whose appointments are after yours.  Also, if you miss three or more appointments without notifying the office, you may be dismissed from the clinic at the provider's discretion.      For prescription refill requests, have your pharmacy contact our office and allow 72 hours for refills to be completed.    Today you received the following chemotherapy and/or immunotherapy agents: Bleocin      To help prevent nausea and vomiting after your treatment, we encourage you to take your nausea medication as directed.  BELOW ARE SYMPTOMS THAT SHOULD BE REPORTED IMMEDIATELY: *FEVER GREATER THAN 100.4 F (38 C) OR HIGHER *CHILLS OR SWEATING *NAUSEA AND VOMITING THAT IS NOT CONTROLLED WITH YOUR NAUSEA MEDICATION *UNUSUAL SHORTNESS OF BREATH *UNUSUAL BRUISING OR BLEEDING *URINARY PROBLEMS (pain or burning when urinating, or frequent urination) *BOWEL PROBLEMS (unusual diarrhea, constipation, pain near the anus) TENDERNESS IN MOUTH AND THROAT WITH OR WITHOUT PRESENCE OF ULCERS (sore throat, sores in mouth, or a toothache) UNUSUAL RASH, SWELLING OR PAIN  UNUSUAL VAGINAL DISCHARGE OR ITCHING   Items with * indicate a potential emergency and should be followed up as soon as possible or go to the Emergency Department if any problems should occur.  Please show the CHEMOTHERAPY ALERT CARD or IMMUNOTHERAPY  ALERT CARD at check-in to the Emergency Department and triage nurse.  Should you have questions after your visit or need to cancel or reschedule your appointment, please contact [facility] - [facility]  Dept: [phone]  and follow the prompts.  Office hours are 8:00 a.m. to 4:30 p.m. Monday - Friday. Please note that voicemails left after 4:00 p.m. may not be returned until the following business day.  We are closed weekends and major holidays. You have access to a nurse at all times for urgent questions. Please call the main number to the clinic Dept: [phone] and follow the prompts.   For any non-urgent questions, you may also contact your provider using MyChart. We now offer e-Visits for anyone 56 and older to request care online for non-urgent symptoms. For details visit [email or web address].   Also download the MyChart app! Go to the app store, search "MyChart", open the app, select [place], and log in with your MyChart username and password.

## 2023-11-06 ENCOUNTER — Inpatient Hospital Stay: Payer: 59

## 2023-11-06 ENCOUNTER — Telehealth: Payer: Self-pay

## 2023-11-06 ENCOUNTER — Other Ambulatory Visit: Payer: 59

## 2023-11-06 DIAGNOSIS — D696 Thrombocytopenia, unspecified: Secondary | ICD-10-CM

## 2023-11-06 DIAGNOSIS — Z5111 Encounter for antineoplastic chemotherapy: Secondary | ICD-10-CM | POA: Diagnosis not present

## 2023-11-06 DIAGNOSIS — C6292 Malignant neoplasm of left testis, unspecified whether descended or undescended: Secondary | ICD-10-CM

## 2023-11-06 LAB — CBC WITH DIFFERENTIAL (CANCER CENTER ONLY)
Abs Immature Granulocytes: 0.1 10*3/uL — ABNORMAL HIGH (ref 0.00–0.07)
Basophils Absolute: 0 10*3/uL (ref 0.0–0.1)
Basophils Relative: 0 %
Eosinophils Absolute: 0 10*3/uL (ref 0.0–0.5)
Eosinophils Relative: 1 %
HCT: 35.5 % — ABNORMAL LOW (ref 39.0–52.0)
Hemoglobin: 11.7 g/dL — ABNORMAL LOW (ref 13.0–17.0)
Immature Granulocytes: 3 %
Lymphocytes Relative: 40 %
Lymphs Abs: 1.4 10*3/uL (ref 0.7–4.0)
MCH: 29.5 pg (ref 26.0–34.0)
MCHC: 33 g/dL (ref 30.0–36.0)
MCV: 89.6 fL (ref 80.0–100.0)
Monocytes Absolute: 0.6 10*3/uL (ref 0.1–1.0)
Monocytes Relative: 17 %
Neutro Abs: 1.3 10*3/uL — ABNORMAL LOW (ref 1.7–7.7)
Neutrophils Relative %: 39 %
Platelet Count: 231 10*3/uL (ref 150–400)
RBC: 3.96 MIL/uL — ABNORMAL LOW (ref 4.22–5.81)
RDW: 14.9 % (ref 11.5–15.5)
WBC Count: 3.4 10*3/uL — ABNORMAL LOW (ref 4.0–10.5)
nRBC: 0 % (ref 0.0–0.2)

## 2023-11-06 MED ORDER — HEPARIN SOD (PORK) LOCK FLUSH 100 UNIT/ML IV SOLN
500.0000 [IU] | Freq: Once | INTRAVENOUS | Status: AC
  Administered 2023-11-06: 500 [IU]

## 2023-11-06 MED ORDER — SODIUM CHLORIDE 0.9% FLUSH
10.0000 mL | Freq: Once | INTRAVENOUS | Status: AC
Start: 2023-11-06 — End: 2023-11-06
  Administered 2023-11-06: 10 mL

## 2023-11-06 NOTE — Telephone Encounter (Signed)
 All appts for March and April cancelled as per Dr. Nelta Numbers request. Informed pt of this and advised that he needs to schedule CT scan ordered by Dr. Cherly Hensen approx 2 weeks before his appt on 5/2. Number for Central Scheduling provided. Also informed that he will need to come in for labs prior to his appt on 5/2 and that scheduling should contact him about this. He verbalizes understanding of all the information.

## 2023-11-06 NOTE — Telephone Encounter (Signed)
 Mandy please let him know platelet counts are normalized so he does not need transfusion anymore. We can cancel all the lab appointment for March and April.  He should have CT scheduled about 2 weeks before my visit on 5/2.  Adding a Lab appointment before my visit on 5/2. thanks

## 2023-11-06 NOTE — Progress Notes (Signed)
 Patient waited for CBC to return. Plts are 231 today. No transfusion indicated. Port de-accessed and patient discharged from infusion ambulatory. Patient reports he is feeling well. No concerns noted.

## 2023-11-06 NOTE — Telephone Encounter (Signed)
 Completed - Informed patient of added appt on 5/2.

## 2023-11-08 ENCOUNTER — Other Ambulatory Visit: Payer: Self-pay

## 2023-11-09 ENCOUNTER — Other Ambulatory Visit (HOSPITAL_COMMUNITY): Payer: Self-pay

## 2023-11-10 NOTE — Progress Notes (Deleted)
 Steve Stewart, male    DOB: [birth year]   MRN: [id number]   Brief patient profile:  33 yo***  *** referred to pulmonary clinic 11/13/2023 by *** for ***        History of Present Illness  11/13/2023  Pulmonary/ 1st office eval/Luanna Weesner  No chief complaint on file.    Dyspnea:  *** Cough: *** Sleep: *** SABA use: *** 02 use:*** LDSCT:***  No obvious day to day or daytime pattern/variability or assoc excess/ purulent sputum or mucus plugs or hemoptysis or cp or chest tightness, subjective wheeze or overt sinus or hb symptoms.    Also denies any obvious fluctuation of symptoms with weather or environmental changes or other aggravating or alleviating factors except as outlined above   No unusual exposure hx or h/o childhood pna/ asthma or knowledge of premature birth.  Current Allergies, Complete Past Medical History, Past Surgical History, Family History, and Social History were reviewed in [employer] record.  ROS  The following are not active complaints unless bolded Hoarseness, sore throat, dysphagia, dental problems, itching, sneezing,  nasal congestion or discharge of excess mucus or purulent secretions, ear ache,   fever, chills, sweats, unintended wt loss or wt gain, classically pleuritic or exertional cp,  orthopnea pnd or arm/hand swelling  or leg swelling, presyncope, palpitations, abdominal pain, anorexia, nausea, vomiting, diarrhea  or change in bowel habits or change in bladder habits, change in stools or change in urine, dysuria, hematuria,  rash, arthralgias, visual complaints, headache, numbness, weakness or ataxia or problems with walking or coordination,  change in mood or  memory.             Outpatient Medications Prior to Visit  Medication Sig Dispense Refill   acetaminophen (TYLENOL) 650 MG CR tablet Take 1,300 mg by mouth every 8 (eight) hours as needed for pain.     albuterol (VENTOLIN HFA) 108 (90 Base) MCG/ACT inhaler Inhale 1  puff into the lungs every 6 (six) hours as needed for wheezing or shortness of breath.     ALPRAZolam (XANAX) 0.25 MG tablet Take 1 tablet (0.25 mg total) by mouth at bedtime as needed for anxiety or sleep. (Patient not taking: Reported on 09/24/2023) 5 tablet 0   apixaban (ELIQUIS) 5 MG TABS tablet Take 1 tablet (5 mg total) by mouth 2 (two) times daily. 180 tablet 0   bictegravir-emtricitabine-tenofovir AF (BIKTARVY) 50-200-25 MG TABS tablet Take 1 tablet by mouth daily. 30 tablet 2   dexamethasone (DECADRON) 4 MG tablet Take 2 tablets (8 mg total) by mouth daily. Start the day after last cisplatin dose on day 6 of chemo x 3 days.Take with food. (Patient not taking: Reported on 09/24/2023) 30 tablet 1   docusate sodium (COLACE) 100 MG capsule Take 2 capsules (200 mg total) by mouth 2 (two) times daily. 20 capsule 0   famotidine (PEPCID) 20 MG tablet Take 1 tablet (20 mg total) by mouth 2 (two) times daily. 30 tablet 2   lidocaine (XYLOCAINE) 2 % solution Use as directed 15 mLs in the mouth or throat every 3 (three) hours as needed for mouth pain. Mix 1 to 1 ratio with Maalox to rinse for pain 100 mL 0   lidocaine-prilocaine (EMLA) cream Apply to affected area once 30 g 3   Melatonin 10 MG TABS Take 1 tablet by mouth at bedtime. 30 tablet 0   oxyCODONE-acetaminophen (PERCOCET) 10-325 MG tablet Take 1 tablet by mouth every 4 (four) hours as  needed for pain. (Patient not taking: Reported on 09/24/2023) 30 tablet 0   polyethylene glycol (MIRALAX / GLYCOLAX) 17 g packet Take 17 g by mouth daily. 14 each 0   prochlorperazine (COMPAZINE) 10 MG tablet Take 1 tablet (10 mg total) by mouth every 6 (six) hours as needed for nausea or vomiting. 30 tablet 1   traZODone (DESYREL) 50 MG tablet Take 1 tablet (50 mg total) by mouth at bedtime. 90 tablet 1   No facility-administered medications prior to visit.    Past Medical History:  Diagnosis Date   HIV infection [facility])    Medical history non-contributory     Testicular cancer (HCC)       Objective:     There were no vitals taken for this visit.         Assessment   No problem-specific Assessment & Plan notes found for this encounter.     [name], MD 11/10/2023

## 2023-11-12 ENCOUNTER — Telehealth: Payer: Self-pay

## 2023-11-12 NOTE — Telephone Encounter (Signed)
 Rescheduled patient's appts. Advised patient to contact us if rescheduling is needed. Provided my direct line.

## 2023-11-13 ENCOUNTER — Other Ambulatory Visit: Payer: Self-pay

## 2023-11-13 ENCOUNTER — Institutional Professional Consult (permissible substitution): Admitting: Internal Medicine

## 2023-11-13 ENCOUNTER — Other Ambulatory Visit: Payer: 59

## 2023-11-15 ENCOUNTER — Other Ambulatory Visit: Payer: 59

## 2023-11-20 ENCOUNTER — Other Ambulatory Visit: Payer: 59

## 2023-11-22 ENCOUNTER — Ambulatory Visit: Payer: 59 | Admitting: Internal Medicine

## 2023-11-23 ENCOUNTER — Other Ambulatory Visit: Payer: 59

## 2023-12-07 ENCOUNTER — Other Ambulatory Visit (HOSPITAL_COMMUNITY): Payer: Self-pay

## 2023-12-07 ENCOUNTER — Ambulatory Visit (HOSPITAL_COMMUNITY)
Admission: RE | Admit: 2023-12-07 | Discharge: 2023-12-07 | Disposition: A | Discharge status: Home / Self Care | Source: Ambulatory Visit

## 2023-12-07 DIAGNOSIS — C6292 Malignant neoplasm of left testis, unspecified whether descended or undescended: Secondary | ICD-10-CM | POA: Diagnosis present

## 2023-12-07 MED ORDER — SODIUM CHLORIDE (PF) 0.9 % IJ SOLN
INTRAMUSCULAR | Status: AC
Start: 2023-12-07 — End: ?
  Filled 2023-12-07: qty 50

## 2023-12-07 MED ORDER — IOHEXOL 300 MG/ML  SOLN
100.0000 mL | Freq: Once | INTRAMUSCULAR | Status: AC | PRN
  Administered 2023-12-07: 100 mL via INTRAVENOUS

## 2023-12-11 ENCOUNTER — Other Ambulatory Visit: Payer: Self-pay

## 2023-12-11 DIAGNOSIS — R591 Generalized enlarged lymph nodes: Secondary | ICD-10-CM

## 2023-12-11 DIAGNOSIS — C6292 Malignant neoplasm of left testis, unspecified whether descended or undescended: Secondary | ICD-10-CM

## 2023-12-11 NOTE — Progress Notes (Signed)
 Discussed CT results with patient. Recommend PET for evaluation. He understands. PET ordered.

## 2023-12-11 NOTE — Addendum Note (Signed)
 Addended by: Janeann Mean on: 12/11/2023 03:32 PM   Modules accepted: Orders

## 2023-12-12 ENCOUNTER — Telehealth: Payer: Self-pay

## 2023-12-12 NOTE — Telephone Encounter (Signed)
 I spoke with Steve Stewart regarding the scheduling of his PET scan. I advised Steve Stewart to get his PET scan scheduled one hour after his Lab appointment on 5/2. Steve Stewart understands and will be expecting a call from Radiology.

## 2023-12-19 ENCOUNTER — Encounter (HOSPITAL_COMMUNITY)

## 2023-12-20 ENCOUNTER — Telehealth: Payer: Self-pay

## 2023-12-20 ENCOUNTER — Ambulatory Visit (HOSPITAL_COMMUNITY): Admission: RE | Admit: 2023-12-20 | Source: Ambulatory Visit

## 2023-12-20 ENCOUNTER — Inpatient Hospital Stay

## 2023-12-20 DIAGNOSIS — Z7901 Long term (current) use of anticoagulants: Secondary | ICD-10-CM | POA: Insufficient documentation

## 2023-12-20 DIAGNOSIS — Z9221 Personal history of antineoplastic chemotherapy: Secondary | ICD-10-CM | POA: Insufficient documentation

## 2023-12-20 DIAGNOSIS — C6292 Malignant neoplasm of left testis, unspecified whether descended or undescended: Secondary | ICD-10-CM | POA: Diagnosis present

## 2023-12-20 DIAGNOSIS — Z86718 Personal history of other venous thrombosis and embolism: Secondary | ICD-10-CM | POA: Diagnosis not present

## 2023-12-20 DIAGNOSIS — D696 Thrombocytopenia, unspecified: Secondary | ICD-10-CM | POA: Insufficient documentation

## 2023-12-20 LAB — CBC WITH DIFFERENTIAL (CANCER CENTER ONLY)
Abs Immature Granulocytes: 0.01 10*3/uL (ref 0.00–0.07)
Basophils Absolute: 0 10*3/uL (ref 0.0–0.1)
Basophils Relative: 1 %
Eosinophils Absolute: 0.1 10*3/uL (ref 0.0–0.5)
Eosinophils Relative: 2 %
HCT: 38.7 % — ABNORMAL LOW (ref 39.0–52.0)
Hemoglobin: 13.2 g/dL (ref 13.0–17.0)
Immature Granulocytes: 0 %
Lymphocytes Relative: 36 %
Lymphs Abs: 1.9 10*3/uL (ref 0.7–4.0)
MCH: 29.5 pg (ref 26.0–34.0)
MCHC: 34.1 g/dL (ref 30.0–36.0)
MCV: 86.4 fL (ref 80.0–100.0)
Monocytes Absolute: 0.3 10*3/uL (ref 0.1–1.0)
Monocytes Relative: 6 %
Neutro Abs: 2.8 10*3/uL (ref 1.7–7.7)
Neutrophils Relative %: 55 %
Platelet Count: 142 10*3/uL — ABNORMAL LOW (ref 150–400)
RBC: 4.48 MIL/uL (ref 4.22–5.81)
RDW: 14.3 % (ref 11.5–15.5)
WBC Count: 5.2 10*3/uL (ref 4.0–10.5)
nRBC: 0 % (ref 0.0–0.2)

## 2023-12-20 LAB — CMP (CANCER CENTER ONLY)
ALT: 18 U/L (ref 0–44)
AST: 15 U/L (ref 15–41)
Albumin: 4.1 g/dL (ref 3.5–5.0)
Alkaline Phosphatase: 51 U/L (ref 38–126)
Anion gap: 4 — ABNORMAL LOW (ref 5–15)
BUN: 15 mg/dL (ref 6–20)
CO2: 28 mmol/L (ref 22–32)
Calcium: 9.3 mg/dL (ref 8.9–10.3)
Chloride: 105 mmol/L (ref 98–111)
Creatinine: 1 mg/dL (ref 0.61–1.24)
GFR, Estimated: >60 mL/min (ref >60)
Glucose, Bld: 132 mg/dL — ABNORMAL HIGH (ref 70–99)
Potassium: 3.9 mmol/L (ref 3.5–5.1)
Sodium: 137 mmol/L (ref 135–145)
Total Bilirubin: 0.4 mg/dL (ref 0.0–1.2)
Total Protein: 6.8 g/dL (ref 6.5–8.1)

## 2023-12-20 LAB — SAMPLE TO BLOOD BANK

## 2023-12-20 LAB — LACTATE DEHYDROGENASE: LDH: 227 U/L — ABNORMAL HIGH (ref 98–192)

## 2023-12-20 MED ORDER — SODIUM CHLORIDE 0.9% FLUSH
10.0000 mL | Freq: Once | INTRAVENOUS | Status: AC
  Administered 2023-12-20: 10 mL

## 2023-12-20 MED ORDER — HEPARIN SOD (PORK) LOCK FLUSH 100 UNIT/ML IV SOLN
250.0000 [IU] | Freq: Once | INTRAVENOUS | Status: AC
  Administered 2023-12-20: 250 [IU]

## 2023-12-20 NOTE — Telephone Encounter (Signed)
 I re-scheduled Steve Stewart's lab appointment per a message I received in the patient calls message pool. I called Steve Stewart to inform him of his lab appointmnet change. I left a detailed message for Steve Stewart to call back if he needs to re-arrange his appointments.

## 2023-12-20 NOTE — Addendum Note (Signed)
 Addended by: Barney Boozer A on: 12/20/2023 01:01 PM   Modules accepted: Orders

## 2023-12-21 ENCOUNTER — Inpatient Hospital Stay

## 2023-12-21 ENCOUNTER — Ambulatory Visit

## 2023-12-21 ENCOUNTER — Other Ambulatory Visit

## 2023-12-21 ENCOUNTER — Ambulatory Visit (HOSPITAL_COMMUNITY)
Admission: RE | Admit: 2023-12-21 | Discharge: 2023-12-21 | Disposition: A | Discharge status: Home / Self Care | Source: Ambulatory Visit

## 2023-12-21 ENCOUNTER — Telehealth: Payer: Self-pay

## 2023-12-21 DIAGNOSIS — R591 Generalized enlarged lymph nodes: Secondary | ICD-10-CM | POA: Insufficient documentation

## 2023-12-21 DIAGNOSIS — C6292 Malignant neoplasm of left testis, unspecified whether descended or undescended: Secondary | ICD-10-CM | POA: Diagnosis present

## 2023-12-21 LAB — BETA HCG QUANT (REF LAB): hCG Quant: <1 m[IU]/mL (ref 0–3)

## 2023-12-21 LAB — AFP TUMOR MARKER: AFP, Serum, Tumor Marker: <1.8 ng/mL (ref 0.0–6.9)

## 2023-12-21 LAB — GLUCOSE, CAPILLARY: Glucose-Capillary: 93 mg/dL (ref 70–99)

## 2023-12-21 MED ORDER — FLUDEOXYGLUCOSE F - 18 (FDG) INJECTION
14.4000 | Freq: Once | INTRAVENOUS | Status: AC
  Administered 2023-12-21: 14.4 via INTRAVENOUS

## 2023-12-21 NOTE — Telephone Encounter (Signed)
 Scheduled patient's appts. Advised patient to contact us if rescheduling is needed. Provided my direct line.

## 2023-12-31 ENCOUNTER — Other Ambulatory Visit (HOSPITAL_COMMUNITY): Payer: Self-pay

## 2023-12-31 ENCOUNTER — Inpatient Hospital Stay (HOSPITAL_BASED_OUTPATIENT_CLINIC_OR_DEPARTMENT_OTHER)

## 2023-12-31 VITALS — BP 112/66 | HR 90 | Temp 97.1°F | Resp 17 | Wt 300.9 lb

## 2023-12-31 DIAGNOSIS — D696 Thrombocytopenia, unspecified: Secondary | ICD-10-CM

## 2023-12-31 DIAGNOSIS — Z86718 Personal history of other venous thrombosis and embolism: Secondary | ICD-10-CM | POA: Diagnosis not present

## 2023-12-31 DIAGNOSIS — C6292 Malignant neoplasm of left testis, unspecified whether descended or undescended: Secondary | ICD-10-CM | POA: Diagnosis not present

## 2023-12-31 NOTE — Assessment & Plan Note (Signed)
 Continue apixaban 5 mg twice daily

## 2023-12-31 NOTE — Progress Notes (Signed)
 [facility] OFFICE PROGRESS NOTE  Patient Care Team: Patient, [name] - General (General Practice)  33 y.o. male with diagnosis of left seminoma s/p orchiectomy presented for follow up for after recent CT scan for testicular cancer post BEP.   Diagnosis: pT2 [id number] normal TM. 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  MRI showed benign adrenal nodule. Patient declined biopsy initially.  Treatment: BEP x3 completed in 10/2023.   O2 saturation 100% on RA.  Discussed CT findings. HIV can have such findings.  Continue treatment for HIV is important.  Given his leg is finally feeling better, will continue anticoagulation. Discussed precautions. Assessment & Plan Primary seminoma of left testis [facility]) New labs and CT ordered. Follow up in 3 months with labs on 8/1 Follow up in 3 months with CT CAP on 8/1 Port removal ordered Follow up with me about mid Aug to allow CT result to be read Thrombocytopenia (HCC) Mild. Recovering well History of DVT (deep vein thrombosis) Symptoms of swelling resolved about a few weeks ago. Will continue apixaban   Orders Placed This Encounter  Procedures   CT CHEST ABDOMEN PELVIS W CONTRAST    Standing Status:   Future    Expected Date:   03/21/2024    Expiration Date:   12/30/2024    If indicated for the ordered procedure, I authorize the administration of contrast media per Radiology protocol:   Yes    Does the patient have a contrast media/X-ray dye allergy?:   No    Preferred imaging location?:   [facility]    If indicated for the ordered procedure, I authorize the administration of oral contrast media per Radiology protocol:   Yes   IR Removal Tun Access W/ Port W/O FL    Standing Status:   Future    Expected Date:   01/07/2024    Expiration Date:   12/30/2024    Reason for exam::   remove mediport    Preferred Imaging Location?:   [facility]   CBC with Differential (Cancer Center Only)     Standing Status:   Future    Expiration Date:   12/30/2024   CMP (Cancer Center only)    Standing Status:   Future    Expiration Date:   12/30/2024   Beta hCG quant (ref lab)    Standing Status:   Future    Expiration Date:   12/30/2024   AFP tumor marker    Standing Status:   Future    Expiration Date:   12/30/2024   Lactate dehydrogenase    Standing Status:   Future    Expiration Date:   12/30/2024     Lowanda Ruddy, MD  INTERVAL HISTORY: Patient returns for follow-up. Overall recovering well. No fever, night sweats, weight loss, palpable lump.  Oncology History  Primary seminoma of left testis (HCC)  04/28/2023 Initial Diagnosis   Primary seminoma of left testis [facility]) Presented to ED for worsening testicular pain and presented with 2 months history of testicular mass.   04/28/2023 Imaging   US  1. Positive for abnormally enlarged and heterogeneous but vascular Left Testis highly suspicious for Testicular Neoplasm. Recommend Urology consultation. 2. Negative for testicular torsion.  Right testis appears normal.   04/28/2023 Imaging   CT CAP 1. Partially visualized thickening of the skin of the left scrotum. 2. Prominent bilateral axillary lymph nodes, right greater than left. Suspect at least 2 lymph  nodes with abnormal cortical thickening in the right axilla. 3. Shotty retroperitoneal, bilateral iliac chain lymph nodes measuring up to 1.1 cm in short axis. 4. Bilateral inguinal lymph nodes with borderline thickened left inguinal lymph nodes measuring up to 1.1 cm in short axis. 5. 1.2 cm right adrenal mass, indeterminate. Attention on follow-up is recommended. Alternatively evaluation with MRI may be considered. 6. Hazy attenuation within the anterior mediastinum without defined mass may represent residual thymus. Attention on follow-up is recommended. 7. Cholelithiasis. 8. L4-L5 bilateral pars articularis defect. 9. 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 10. Further evaluation with axillary and inguinal ultrasound may be considered.   05/02/2023 Surgery   TESTICLE, LEFT, ORCHIECTOMY:  Seminoma, size 6.1 cm  Tumor limited to testis with rete testis and lymphovascular invasion  (pT2)  Germ cell neoplasia in situ is present  Spermatic cord and all margins of resection are negative for tumor     06/25/2023 Imaging   CT AP IMPRESSION: 1. Stable mild periportal, bilateral inguinal, bilateral external iliac  and bilateral common iliac lymphadenopathy. 2. No new or progressive metastatic disease in the abdomen or pelvis. 3. Solid peripheral basilar 2.0 cm right lower lobe pulmonary nodule, new from 04/28/2023 CT, similar to recent 06/22/2023 chest CT, favoring evolving pulmonary infarct related to recent diagnosis of acute pulmonary embolism. Additional patchy consolidation at the posterior right lung base seen on prior chest CT has resolved. 4. Cholelithiasis. 5. Stable bilateral adrenal nodules characterized as benign adenomas on recent MRI, requiring no further imaging follow-up.     07/09/2023 -  Chemotherapy   Patient is on Treatment Plan : TESTICULAR BEP q21d x 3 Cycles     12/21/2023 PET scan   PET Scattered mild lymph node enlargement identified in the axillary regions, pelvic sidewall and inguinal regions. These have relatively low to medium level hypermetabolism on the PET-CT scan. Based on level of uptake and the patient's history of HIV these could be related to the patient's HIV status. Please correlate with the patient's viral load. Malignant nodes are in the differential but based on level of uptake in the distribution as well as the morphologic appearance on CT, is felt to be less likely at this time. Recommend close follow-up.   Fatty liver infiltration. Gallstone. Surgical changes of prior left orchiectomy.      PHYSICAL EXAMINATION: ECOG PERFORMANCE STATUS: 0 - Asymptomatic  Vitals:   12/31/23 1417   BP: 112/66  Pulse: 90  Resp: 17  Temp: (!) 97.1 F (36.2 C)  SpO2: 100%   Filed Weights   12/31/23 1417  Weight: (!) 300 lb 14.4 oz (136.5 kg)    Relevant data reviewed during this visit included labs and imaging.

## 2023-12-31 NOTE — Assessment & Plan Note (Addendum)
 Mild. Recovering well

## 2023-12-31 NOTE — Assessment & Plan Note (Addendum)
 Symptoms of swelling resolved about a few weeks ago. Will continue apixaban 

## 2023-12-31 NOTE — Assessment & Plan Note (Addendum)
 New labs and CT ordered. Follow up in 3 months with labs on 8/1 Follow up in 3 months with CT CAP on 8/1 Port removal ordered Follow up with me about mid Aug to allow CT result to be read

## 2024-01-01 ENCOUNTER — Other Ambulatory Visit: Payer: Self-pay

## 2024-01-01 ENCOUNTER — Other Ambulatory Visit: Payer: Self-pay | Admitting: Internal Medicine

## 2024-01-01 DIAGNOSIS — B20 Human immunodeficiency virus [HIV] disease: Secondary | ICD-10-CM

## 2024-01-02 NOTE — Progress Notes (Unsigned)
 Steve Stewart, male    DOB: Feb 15, 1991   MRN: [id number]   Brief patient profile:  32  quit smoking sept 2024   referred to pulmonary clinic 01/03/2024 by  for doe s/p PE and Rx with Bleomycin  final dose ? 10/2023     Testicular  L orchiectomy sept 2024  then chemo no radiation   PFTs nl 06/21/23  PE Nov 2024  L DVT /PE with residual L leg swelling and doe   Admit date:     07/26/2023  Discharge date: 08/03/2023    Discharge Diagnoses: Principal Problem:   Acute pulmonary embolism with acute cor pulmonale (HCC)   Primary seminoma of left testis (HCC)   Acute respiratory failure with hypoxia (HCC)   HIV disease (HCC)   DVT (deep venous thrombosis) (HCC)   Hyponatremia   Pancytopenia (HCC)   Antineoplastic chemotherapy induced pancytopenia (HCC)   Embolism, pulmonary with infarction (HCC)   Normocytic anemia   Brief hospital course: PMH seminoma s/p orchiectomy, acute PE diagnosed Nov 1st 2024 with LLE DVT, HIV on HAART.  Pt currently on bleomycin  for seminoma (cycle 1 completed 11/18 - 12/3). Pt had been on eliquis  for DVT/PE but this was put on hold this past week due to severe thrombocytopenia with platelets in the 20s.   Repeat CT angio chest performed 07/20/2023 and showed resolution of previously seen right sided pulmonary emboli. Presented with chest pain, again underwent CT angio chest on 07/27/2023 on admission which showed bilateral pulmonary emboli right greater than left and peripheral groundglass opacities in the right lower lobe favored as pulmonary infarcts. Lower extremity duplex also revealed left lower extremity acute DVT involving multiple veins. Echo was performed on 07/27/2023 which showed normal RV function with moderately enlarged right ventricle.  Echo was again repeated on 07/28/2023 which showed normal RV function and normal RV size at that time. Pulmonary and oncology were following the patient. Was on heparin  drip. Now on Eliquis .   Assessment and  Plan: Acute pulmonary embolism with acute cor pulmonale Acute left leg DVT CT angio chest performed on 07/27/2023 showing bilateral PE right greater than left along with patchy peripheral groundglass opacities in the right lower lobe concerning for pulmonary infarcts Initial echo on 07/27/2023 showed moderate right ventricular enlargement, normal RV function.  Repeat echo on 07/28/2023 showed normalized right heart Pulmonary was consulted.  Currently on Eliquis .  Oxygenation improving.  Was on BiPAP.  Now only on 2 LPM.   Pain control PCCM started the patient on Dilaudid  PCA. At present pain is improving. Patient was successfully able to be weaned off from PCA. Based on his last 24-hour use on oral pain medications will continue oral Percocet on discharge as as needed only.   Acute respiratory failure with hypoxia due to underlying PE, with presumed pulmonary infarct and GGO Will continue oxygen therapy as the patient remains hypoxic.   Concern for OSA. Outpatient sleep study recommended.   Primary seminoma of left testis (HCC) Pt on chemo with heme/onc including bleomycin  - cycle 1 completed 11/18 - 12/3 Outpatient management per oncology.   HIV disease (HCC) Recent diagnosis beginning of November 2024 Continue Biktarvy  CD4 255 on 06/28/2023; HIV VL 117k   Hyponatremia Likely from poor p.o. intake.  Improving.  Monitor.  asymptomatic.   Pancytopenia Chemo effect from bleomycin  Cell counts are recovering   Procedures performed:  BiPAP     History of Present Illness  01/03/2024  Pulmonary/ 1st office eval/Steve Stewart  maint on  eliquis   Chief Complaint  Patient presents with   Consult    Consult for pulmonary embolism.  Dyspnea:  about half normal / planning to join gym  Cough: none  Sleep: flat bad one pillow s resp cc  SABA use: none  02 ZOX:WRUEA nov admit 2lpm hs only     No obvious day to day or daytime pattern/variability or assoc excess/ purulent sputum or mucus plugs  or hemoptysis or cp or chest tightness, subjective wheeze or overt sinus or hb symptoms.    Also denies any obvious fluctuation of symptoms with weather or environmental changes or other aggravating or alleviating factors except as outlined above   No unusual exposure hx or h/o childhood pna/ asthma or knowledge of premature birth.  Current Allergies, Complete Past Medical History, Past Surgical History, Family History, and Social History were reviewed in [employer] record.  ROS  The following are not active complaints unless bolded Hoarseness, sore throat, dysphagia, dental problems, itching, sneezing,  nasal congestion or discharge of excess mucus or purulent secretions, ear ache,   fever, chills, sweats, unintended wt loss or wt gain, classically pleuritic or exertional cp,  orthopnea pnd or arm/hand swelling  or leg swelling, presyncope, palpitations, abdominal pain, anorexia, nausea, vomiting, diarrhea  or change in bowel habits or change in bladder habits, change in stools or change in urine, dysuria, hematuria,  rash, arthralgias, visual complaints, headache, numbness, weakness or ataxia or problems with walking or coordination,  change in mood or  memory.             Outpatient Medications Prior to Visit  Medication Sig Dispense Refill   albuterol  (VENTOLIN  HFA) 108 (90 Base) MCG/ACT inhaler Inhale 1 puff into the lungs every 6 (six) hours as needed for wheezing or shortness of breath.     apixaban  (ELIQUIS ) 5 MG TABS tablet Take 1 tablet (5 mg total) by mouth 2 (two) times daily. 180 tablet 0   BIKTARVY  50-200-25 MG TABS tablet TAKE 1 TABLET BY MOUTH DAILY 30 tablet 0   docusate sodium  (COLACE) 100 MG capsule Take 2 capsules (200 mg total) by mouth 2 (two) times daily. 20 capsule 0   acetaminophen  (TYLENOL ) 650 MG CR tablet Take 1,300 mg by mouth every 8 (eight) hours as needed for pain. (Patient not taking: Reported on 01/03/2024)     ALPRAZolam  (XANAX ) 0.25 MG  tablet Take 1 tablet (0.25 mg total) by mouth at bedtime as needed for anxiety or sleep. (Patient not taking: Reported on 01/03/2024) 5 tablet 0   dexamethasone  (DECADRON ) 4 MG tablet Take 2 tablets (8 mg total) by mouth daily. Start the day after last cisplatin  dose on day 6 of chemo x 3 days.Take with food. (Patient not taking: Reported on 09/24/2023) 30 tablet 1   famotidine  (PEPCID ) 20 MG tablet Take 1 tablet (20 mg total) by mouth 2 (two) times daily. (Patient not taking: Reported on 01/03/2024) 30 tablet 2   lidocaine  (XYLOCAINE ) 2 % solution Use as directed 15 mLs in the mouth or throat every 3 (three) hours as needed for mouth pain. Mix 1 to 1 ratio with Maalox to rinse for pain (Patient not taking: Reported on 01/03/2024) 100 mL 0   lidocaine -prilocaine  (EMLA ) cream Apply to affected area once (Patient not taking: Reported on 01/03/2024) 30 g 3   Melatonin 10 MG TABS Take 1 tablet by mouth at bedtime. (Patient not taking: Reported on 01/03/2024) 30 tablet 0   oxyCODONE -acetaminophen  (PERCOCET) 10-325  MG tablet Take 1 tablet by mouth every 4 (four) hours as needed for pain. (Patient not taking: Reported on 01/03/2024) 30 tablet 0   polyethylene glycol (MIRALAX  / GLYCOLAX ) 17 g packet Take 17 g by mouth daily. (Patient not taking: Reported on 01/03/2024) 14 each 0   prochlorperazine  (COMPAZINE ) 10 MG tablet Take 1 tablet (10 mg total) by mouth every 6 (six) hours as needed for nausea or vomiting. (Patient not taking: Reported on 01/03/2024) 30 tablet 1   traZODone  (DESYREL ) 50 MG tablet Take 1 tablet (50 mg total) by mouth at bedtime. (Patient not taking: Reported on 01/03/2024) 90 tablet 1   No facility-administered medications prior to visit.    Past Medical History:  Diagnosis Date   HIV infection (HCC)    Medical history non-contributory    Testicular cancer (HCC)       Objective:     BP 132/86 (BP Location: Left Arm, Patient Position: Sitting, Cuff Size: Large)   Pulse 98   Ht 5\' 11"   (1.803 m)   Wt 300 lb (136.1 kg)   SpO2 99%   BMI 41.84 kg/m   SpO2: 99 % RA  Amb MO (by BMI) wm nad   HEENT : Oropharynx  clear/ poor dentition     Nasal turbinates nl    NECK :  without  apparent JVD/ palpable Nodes/TM    LUNGS: no acc muscle use,  Nl contour chest which is clear to A and P bilaterally without cough on insp or exp maneuvers   CV:  RRR  no s3 or murmur or increase in P2, and no edema   ABD: obese   soft and nontender   MS:  Gait nl   ext warm without deformities Or obvious joint restrictions  calf tenderness, cyanosis or clubbing    SKIN: warm and dry without lesions    NEURO:  alert, approp, nl sensorium with  no motor or cerebellar deficits apparent.    I personally reviewed images and agree with radiology impression as follows:   Chest CT chest with contrast   12/07/23  Near complete resolution of the bilateral consolidative opacities. Residual opacity in the right lower lobe with interlobular septal thickening is also improved from prior. No suspicious pulmonary nodules or masses.      Assessment   DOE (dyspnea on exertion) Quit smoking 04/2023 with dx of L testicular ca  - PFTs   @ wt 257 on 06/21/23 wnl  - s/p Bleo chemo complete 10/2023  - S/p PE 06/22/23  and again 07/26/23(while off doac due to low plts) assoc L DVT - Echo 07/28/23 with nl RV - CT chest 12/07/23 neg for ILD / bleo toxicity  -  01/03/2024   Walked on RA @ wt  300 lb  x  3  lap(s) =  approx 750  ft  @ moderate  pace, stopped due to end of study with lowest 02 sats 99% and minimal sob   Luckily despite smoking hx he started out with nl lung function and his residual doe at this point is likely more related to wt gain than lung or heart dz from above potential concerns.   Rec Make sure you check your oxygen saturation at your highest level of activity(NOT after you stop)  to be sure it stays over 90% and keep track of it at least once a week, more often if breathing getting worse, and  let me know if losing ground. (Collect the dots to  connect the dots approach)    F/u 3 m , sooner if needed   Morbid obesity due to excess calories (HCC) Body mass index is 41.84 kg/m.  -     Contributing to doe and risk of GERD/ worsening venous dz s/p PE and OSA  >>>   reviewed the need and the process to achieve and maintain neg calorie balance > defer f/u primary care including intermittently monitoring thyroid status     Each maintenance medication was reviewed in detail including emphasizing most importantly the difference between maintenance and prns and under what circumstances the prns are to be triggered using an action plan format where appropriate.  Total time for H and P, chart review, counseling,  directly observing portions of ambulatory 02 saturation study/ and generating customized AVS unique to this office visit / same day charting = 45 min with pt new to me                   Vernestine Gondola, MD 01/03/2024

## 2024-01-03 ENCOUNTER — Ambulatory Visit (INDEPENDENT_AMBULATORY_CARE_PROVIDER_SITE_OTHER): Admitting: Internal Medicine

## 2024-01-03 ENCOUNTER — Encounter: Payer: Self-pay | Admitting: Internal Medicine

## 2024-01-03 VITALS — BP 132/86 | HR 98 | Ht 71.0 in | Wt 300.0 lb

## 2024-01-03 DIAGNOSIS — R0609 Other forms of dyspnea: Secondary | ICD-10-CM | POA: Diagnosis not present

## 2024-01-03 DIAGNOSIS — Z6841 Body Mass Index (BMI) 40.0 and over, adult: Secondary | ICD-10-CM | POA: Diagnosis not present

## 2024-01-03 NOTE — Assessment & Plan Note (Signed)
 Quit smoking 04/2023 with dx of L testicular ca  - PFTs   @ wt 257 on 06/21/23 wnl  - s/p Bleo chemo complete 10/2023  - S/p PE 06/22/23  and again 07/26/23(while off doac due to low plts) assoc L DVT - Echo 07/28/23 with nl RV - CT chest 12/07/23 neg for ILD / bleo toxicity  -  01/03/2024   Walked on RA @ wt  300 lb  x  3  lap(s) =  approx 750  ft  @ moderate  pace, stopped due to end of study with lowest 02 sats 99% and minimal sob   Luckily despite smoking hx he started out with nl lung function and his residual doe at this point is likely more related to wt gain than lung or heart dz from above potential concerns.   Rec Make sure you check your oxygen saturation at your highest level of activity(NOT after you stop)  to be sure it stays over 90% and keep track of it at least once a week, more often if breathing getting worse, and let me know if losing ground. (Collect the dots to connect the dots approach)    F/u 3 m , sooner if needed

## 2024-01-03 NOTE — Assessment & Plan Note (Signed)
 Body mass index is 41.84 kg/m.  -     Contributing to doe and risk of GERD/ worsening venous dz s/p PE and OSA  >>>   reviewed the need and the process to achieve and maintain neg calorie balance > defer f/u primary care including intermittently monitoring thyroid status     Each maintenance medication was reviewed in detail including emphasizing most importantly the difference between maintenance and prns and under what circumstances the prns are to be triggered using an action plan format where appropriate.  Total time for H and P, chart review, counseling,  directly observing portions of ambulatory 02 saturation study/ and generating customized AVS unique to this office visit / same day charting = 45 min with pt new to me

## 2024-01-03 NOTE — Patient Instructions (Signed)
 Make sure you check your oxygen saturation at your highest level of activity (NOT after you stop)  to be sure it stays over 90% and keep track of it at least once a week, more often if breathing getting worse, and let me know if losing ground. (Collect the dots to connect the dots approach)    Pulse oximeter  Please schedule a follow up visit in 3 months but call sooner if needed

## 2024-01-10 ENCOUNTER — Other Ambulatory Visit (HOSPITAL_COMMUNITY): Payer: Self-pay

## 2024-01-18 ENCOUNTER — Inpatient Hospital Stay (HOSPITAL_COMMUNITY): Admission: RE | Admit: 2024-01-18 | Source: Ambulatory Visit

## 2024-02-12 ENCOUNTER — Other Ambulatory Visit: Payer: Self-pay

## 2024-02-12 DIAGNOSIS — B20 Human immunodeficiency virus [HIV] disease: Secondary | ICD-10-CM

## 2024-02-12 MED ORDER — BIKTARVY 50-200-25 MG PO TABS
1.0000 | ORAL_TABLET | Freq: Every day | ORAL | 1 refills | Status: DC
Start: 2024-02-12 — End: 2024-02-27

## 2024-02-18 ENCOUNTER — Other Ambulatory Visit (HOSPITAL_COMMUNITY): Payer: Self-pay

## 2024-02-18 ENCOUNTER — Telehealth: Payer: Self-pay

## 2024-02-18 ENCOUNTER — Inpatient Hospital Stay (HOSPITAL_COMMUNITY): Admission: RE | Admit: 2024-02-18 | Source: Ambulatory Visit

## 2024-02-18 NOTE — Telephone Encounter (Signed)
 [facility] Patient Product/process development scientist completed.    The patient is insured through [facility] and has a $0.00 copay.  [employer] $0.00  We will continue to follow to see if copay assistance is needed.  Arland Hutchinson, CPhT Specialty Pharmacy Patient [facility] Phone: [phone] Fax:  [phone]

## 2024-02-19 ENCOUNTER — Telehealth: Payer: Self-pay | Admitting: *Deleted

## 2024-02-19 NOTE — Telephone Encounter (Signed)
 LM for Steve Stewart from Interventional Radiology:   I have scheduled this patient twice now for port removal. 6/30 & 5/30, I spoke with patient to schedule both times and confirmed appts the day before with him. If he needs to be rescheduled someone will need to make appt for him and make sure he realizes we will not schedule after a 3rd no show. He is taking a slot from those who want and come to their appointments

## 2024-02-27 ENCOUNTER — Encounter: Payer: Self-pay | Admitting: Internal Medicine

## 2024-02-27 ENCOUNTER — Other Ambulatory Visit (HOSPITAL_COMMUNITY): Payer: Self-pay

## 2024-02-27 ENCOUNTER — Other Ambulatory Visit: Payer: Self-pay

## 2024-02-27 ENCOUNTER — Ambulatory Visit: Admitting: Internal Medicine

## 2024-02-27 VITALS — BP 117/81 | HR 89 | Temp 97.7°F | Ht 71.0 in | Wt 303.0 lb

## 2024-02-27 DIAGNOSIS — Z23 Encounter for immunization: Secondary | ICD-10-CM | POA: Diagnosis present

## 2024-02-27 DIAGNOSIS — B20 Human immunodeficiency virus [HIV] disease: Secondary | ICD-10-CM

## 2024-02-27 DIAGNOSIS — C6292 Malignant neoplasm of left testis, unspecified whether descended or undescended: Secondary | ICD-10-CM | POA: Diagnosis not present

## 2024-02-27 MED ORDER — BIKTARVY 50-200-25 MG PO TABS
1.0000 | ORAL_TABLET | Freq: Every day | ORAL | 5 refills | Status: DC
  Filled 2024-02-27 – 2024-03-13 (×2): qty 30, 30d supply, fill #0
  Filled 2024-04-10: qty 30, 30d supply, fill #1
  Filled 2024-05-09 – 2024-05-29 (×3): qty 30, 30d supply, fill #2
  Filled 2024-06-25 – 2024-07-16 (×3): qty 30, 30d supply, fill #3
  Filled 2024-08-11: qty 30, 30d supply, fill #4

## 2024-02-27 NOTE — Progress Notes (Signed)
 Specialty Pharmacy Refill Coordination Note  Steve Stewart is a 33 y.o. male contacted today regarding refills of specialty medication(s) Bictegravir-Emtricitab-Tenofov (Biktarvy )   Patient requested [name] at [facility] at [place] date: 02/29/24   Medication will be filled on 02/28/24.

## 2024-02-27 NOTE — Progress Notes (Signed)
 [facility]     HPI: Steve Stewart is a 33 y.o. male presents for HIV management. Missed a couple doses of biktarvy .  Has not been sexually active since last visit.   Past Medical History:  Diagnosis Date   HIV infection [facility])    Medical history non-contributory    Testicular cancer [facility])     Past Surgical History:  Procedure Laterality Date   IR IMAGING GUIDED PORT INSERTION  06/18/2023   NO PAST SURGERIES     ORCHIECTOMY Left 05/02/2023   Procedure: LEFT INGUINAL ORCHIECTOMY;  Surgeon: Devere Lonni Righter, MD;  Location: [facility];  Service: Urology;  Laterality: Left;    No family history on file. Current Outpatient Medications on File Prior to Visit  Medication Sig Dispense Refill   apixaban  (ELIQUIS ) 5 MG TABS tablet Take 1 tablet (5 mg total) by mouth 2 (two) times daily. 180 tablet 0   bictegravir-emtricitabine -tenofovir  AF (BIKTARVY ) 50-200-25 MG TABS tablet Take 1 tablet by mouth daily. 30 tablet 1   acetaminophen  (TYLENOL ) 650 MG CR tablet Take 1,300 mg by mouth every 8 (eight) hours as needed for pain. (Patient not taking: Reported on 02/27/2024)     albuterol  (VENTOLIN  HFA) 108 (90 Base) MCG/ACT inhaler Inhale 1 puff into the lungs every 6 (six) hours as needed for wheezing or shortness of breath.     ALPRAZolam  (XANAX ) 0.25 MG tablet Take 1 tablet (0.25 mg total) by mouth at bedtime as needed for anxiety or sleep. (Patient not taking: Reported on 02/27/2024) 5 tablet 0   dexamethasone  (DECADRON ) 4 MG tablet Take 2 tablets (8 mg total) by mouth daily. Start the day after last cisplatin  dose on day 6 of chemo x 3 days.Take with food. (Patient not taking: Reported on 02/27/2024) 30 tablet 1   docusate sodium  (COLACE) 100 MG capsule Take 2 capsules (200 mg total) by mouth 2 (two) times daily. (Patient not taking: Reported on 02/27/2024) 20 capsule 0   famotidine  (PEPCID ) 20 MG tablet Take 1 tablet (20 mg total) by mouth 2 (two) times daily.  (Patient not taking: Reported on 02/27/2024) 30 tablet 2   lidocaine  (XYLOCAINE ) 2 % solution Use as directed 15 mLs in the mouth or throat every 3 (three) hours as needed for mouth pain. Mix 1 to 1 ratio with Maalox to rinse for pain (Patient not taking: Reported on 02/27/2024) 100 mL 0   lidocaine -prilocaine  (EMLA ) cream Apply to affected area once (Patient not taking: Reported on 02/27/2024) 30 g 3   Melatonin 10 MG TABS Take 1 tablet by mouth at bedtime. (Patient not taking: Reported on 02/27/2024) 30 tablet 0   oxyCODONE -acetaminophen  (PERCOCET) 10-325 MG tablet Take 1 tablet by mouth every 4 (four) hours as needed for pain. (Patient not taking: Reported on 02/27/2024) 30 tablet 0   polyethylene glycol (MIRALAX  / GLYCOLAX ) 17 g packet Take 17 g by mouth daily. (Patient not taking: Reported on 02/27/2024) 14 each 0   prochlorperazine  (COMPAZINE ) 10 MG tablet Take 1 tablet (10 mg total) by mouth every 6 (six) hours as needed for nausea or vomiting. (Patient not taking: Reported on 02/27/2024) 30 tablet 1   traZODone  (DESYREL ) 50 MG tablet Take 1 tablet (50 mg total) by mouth at bedtime. (Patient not taking: Reported on 02/27/2024) 90 tablet 1   No current facility-administered medications on file prior to visit.    No Known Allergies    Lab Results HIV 1 RNA  Quant  Date Value  09/24/2023 <20 Copies/mL (H)  06/28/2023 117,000 copies/mL (H)  06/23/2023 59,200 copies/mL   CD4 T Cell Abs (/uL)  Date Value  09/24/2023 572  06/28/2023 255 (L)  06/23/2023    CANCELED BY LAB, SPECIMEN DOES NOT MEET COLLECTION REQUIREMENTS   No results found for: HIV1GENOSEQ Lab Results  Component Value Date   WBC 5.2 12/20/2023   HGB 13.2 12/20/2023   HCT 38.7 (L) 12/20/2023   MCV 86.4 12/20/2023   PLT 142 (L) 12/20/2023    Lab Results  Component Value Date   CREATININE 1.00 12/20/2023   BUN 15 12/20/2023   NA 137 12/20/2023   K 3.9 12/20/2023   CL 105 12/20/2023   CO2 28 12/20/2023   Lab Results   Component Value Date   ALT 18 12/20/2023   AST 15 12/20/2023   ALKPHOS 51 12/20/2023   BILITOT 0.4 12/20/2023    Lab Results  Component Value Date   CHOL 140 06/28/2023   TRIG 141 06/28/2023   HDL 34 (L) 06/28/2023   LDLCALC 82 06/28/2023   Lab Results  Component Value Date   HAV NON-REACTIVE 06/28/2023   Lab Results  Component Value Date   HEPBSAG NON-REACTIVE 06/28/2023   HEPBSAB NON-REACTIVE 06/28/2023   No results found for: HCVAB Lab Results  Component Value Date   CHLAMYDIAWP Negative 09/24/2023   N Negative 09/24/2023   No results found for: GCPROBEAPT No results found for: QUANTGOLD  Assessment/Plan #HIV/Asymptomatic -CD4 572, Vl nd , on 2/25. 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    #testicular Ca -On BEP, follows with Heme/onc     #Vaccination COVID-declined Flu-declined Monkeypox PCV-declined Meningitis-declined HepA-non immune  06/28/23 HEpB non immune 06/28/23 Tdap 09/08/22 HPV->Today  Shingles   #Health maintenance -Quantiferon negative 06/28/23 -RPR 06/28/23 NR -HCV NR 06/28/23 -GC2/3/25y urine. Pt declined oral and rectal swab -Lipid- , on 06/28/23 Age dependent: -Dysplasia screen M-next time -Colonoscopy    Loney Stank, MD [facility]  I have personally spent 49 minutes involved in face-to-face and non-face-to-face activities for this patient on the day of the visit. Professional time spent includes the following activities: Preparing to see the patient (review of tests), Obtaining and/or reviewing separately obtained history (admission/discharge record), Performing a medically appropriate examination and/or evaluation , Ordering medications/tests/procedures, referring and communicating with other health care professionals, Documenting clinical information in the EMR, Independently  interpreting results (not separately reported), Communicating results to the patient/family/caregiver, Counseling and educating the patient/family/caregiver and Care coordination (not separately reported).

## 2024-02-28 ENCOUNTER — Other Ambulatory Visit: Payer: Self-pay

## 2024-02-29 ENCOUNTER — Other Ambulatory Visit: Payer: Self-pay

## 2024-02-29 LAB — COMPLETE METABOLIC PANEL WITHOUT GFR
AG Ratio: 1.8 (calc) (ref 1.0–2.5)
ALT: 19 U/L (ref 9–46)
AST: 15 U/L (ref 10–40)
Albumin: 4.4 g/dL (ref 3.6–5.1)
Alkaline phosphatase (APISO): 57 U/L (ref 36–130)
BUN: 18 mg/dL (ref 7–25)
CO2: 24 mmol/L (ref 20–32)
Calcium: 9.7 mg/dL (ref 8.6–10.3)
Chloride: 106 mmol/L (ref 98–110)
Creat: 1.06 mg/dL (ref 0.60–1.26)
Globulin: 2.4 g/dL (ref 1.9–3.7)
Glucose, Bld: 92 mg/dL (ref 65–99)
Potassium: 4.1 mmol/L (ref 3.5–5.3)
Sodium: 139 mmol/L (ref 135–146)
Total Bilirubin: 0.5 mg/dL (ref 0.2–1.2)
Total Protein: 6.8 g/dL (ref 6.1–8.1)

## 2024-02-29 LAB — CBC WITH DIFFERENTIAL/PLATELET
Absolute Lymphocytes: 2030 {cells}/uL (ref 850–3900)
Absolute Monocytes: 384 {cells}/uL (ref 200–950)
Basophils Absolute: 10 {cells}/uL (ref 0–200)
Basophils Relative: 0.2 %
Eosinophils Absolute: 62 {cells}/uL (ref 15–500)
Eosinophils Relative: 1.3 %
HCT: 44.1 % (ref 38.5–50.0)
Hemoglobin: 14.3 g/dL (ref 13.2–17.1)
MCH: 28.6 pg (ref 27.0–33.0)
MCHC: 32.4 g/dL (ref 32.0–36.0)
MCV: 88.2 fL (ref 80.0–100.0)
MPV: 11.8 fL (ref 7.5–12.5)
Monocytes Relative: 8 %
Neutro Abs: 2314 {cells}/uL (ref 1500–7800)
Neutrophils Relative %: 48.2 %
Platelets: 140 Thousand/uL (ref 140–400)
RBC: 5 Million/uL (ref 4.20–5.80)
RDW: 14.5 % (ref 11.0–15.0)
Total Lymphocyte: 42.3 %
WBC: 4.8 Thousand/uL (ref 3.8–10.8)

## 2024-02-29 LAB — HIV-1 RNA QUANT-NO REFLEX-BLD
HIV 1 RNA Quant: <20 {copies}/mL — AB
HIV-1 RNA Quant, Log: <1.3 {Log_copies}/mL — AB

## 2024-02-29 LAB — T-HELPER CELLS (CD4) COUNT (NOT AT ARMC)
CD4 % Helper T Cell: 30 % — ABNORMAL LOW (ref 33–65)
CD4 T Cell Abs: 569 /uL (ref 400–1790)

## 2024-03-10 ENCOUNTER — Other Ambulatory Visit: Payer: Self-pay

## 2024-03-11 ENCOUNTER — Other Ambulatory Visit (HOSPITAL_COMMUNITY): Payer: Self-pay

## 2024-03-12 ENCOUNTER — Other Ambulatory Visit (HOSPITAL_COMMUNITY): Payer: Self-pay

## 2024-03-13 ENCOUNTER — Other Ambulatory Visit (HOSPITAL_COMMUNITY): Payer: Self-pay

## 2024-03-13 ENCOUNTER — Other Ambulatory Visit: Payer: Self-pay

## 2024-03-13 NOTE — Progress Notes (Signed)
 Specialty Pharmacy Refill Coordination Note  Spoke with Steve Stewart is a 33 y.o. male contacted today regarding refills of specialty medication(s) Bictegravir-Emtricitab-Tenofov (Biktarvy )  Doses on hand: 2   Patient requested: Pickup at [facility] at [place] date: 03/14/24  Medication will be filled on 03/14/24.

## 2024-03-13 NOTE — Progress Notes (Signed)
 Medication returned to stock, call center attempted to reach patient. Medication never picked up, [place] also lvm for patient and is aware.

## 2024-03-15 ENCOUNTER — Other Ambulatory Visit (HOSPITAL_COMMUNITY): Payer: Self-pay

## 2024-03-21 ENCOUNTER — Ambulatory Visit (HOSPITAL_COMMUNITY)
Admission: RE | Admit: 2024-03-21 | Discharge: 2024-03-21 | Disposition: A | Discharge status: Home / Self Care | Source: Ambulatory Visit

## 2024-03-21 ENCOUNTER — Inpatient Hospital Stay

## 2024-03-21 DIAGNOSIS — Z7901 Long term (current) use of anticoagulants: Secondary | ICD-10-CM | POA: Diagnosis not present

## 2024-03-21 DIAGNOSIS — D696 Thrombocytopenia, unspecified: Secondary | ICD-10-CM

## 2024-03-21 DIAGNOSIS — B20 Human immunodeficiency virus [HIV] disease: Secondary | ICD-10-CM | POA: Insufficient documentation

## 2024-03-21 DIAGNOSIS — R590 Localized enlarged lymph nodes: Secondary | ICD-10-CM | POA: Insufficient documentation

## 2024-03-21 DIAGNOSIS — Z9221 Personal history of antineoplastic chemotherapy: Secondary | ICD-10-CM | POA: Insufficient documentation

## 2024-03-21 DIAGNOSIS — C6292 Malignant neoplasm of left testis, unspecified whether descended or undescended: Secondary | ICD-10-CM | POA: Diagnosis present

## 2024-03-21 DIAGNOSIS — Z86711 Personal history of pulmonary embolism: Secondary | ICD-10-CM | POA: Diagnosis not present

## 2024-03-21 LAB — CMP (CANCER CENTER ONLY)
ALT: 27 U/L (ref 0–44)
AST: 21 U/L (ref 15–41)
Albumin: 4.7 g/dL (ref 3.5–5.0)
Alkaline Phosphatase: 55 U/L (ref 38–126)
Anion gap: 8 (ref 5–15)
BUN: 21 mg/dL — ABNORMAL HIGH (ref 6–20)
CO2: 24 mmol/L (ref 22–32)
Calcium: 9.4 mg/dL (ref 8.9–10.3)
Chloride: 106 mmol/L (ref 98–111)
Creatinine: 1.39 mg/dL — ABNORMAL HIGH (ref 0.61–1.24)
GFR, Estimated: >60 mL/min (ref >60)
Glucose, Bld: 87 mg/dL (ref 70–99)
Potassium: 4 mmol/L (ref 3.5–5.1)
Sodium: 138 mmol/L (ref 135–145)
Total Bilirubin: 0.6 mg/dL (ref 0.0–1.2)
Total Protein: 7.6 g/dL (ref 6.5–8.1)

## 2024-03-21 LAB — CBC WITH DIFFERENTIAL (CANCER CENTER ONLY)
Abs Immature Granulocytes: 0.01 K/uL (ref 0.00–0.07)
Basophils Absolute: 0 K/uL (ref 0.0–0.1)
Basophils Relative: 0 %
Eosinophils Absolute: 0 K/uL (ref 0.0–0.5)
Eosinophils Relative: 1 %
HCT: 42.6 % (ref 39.0–52.0)
Hemoglobin: 14.7 g/dL (ref 13.0–17.0)
Immature Granulocytes: 0 %
Lymphocytes Relative: 43 %
Lymphs Abs: 2.5 K/uL (ref 0.7–4.0)
MCH: 28.7 pg (ref 26.0–34.0)
MCHC: 34.5 g/dL (ref 30.0–36.0)
MCV: 83.2 fL (ref 80.0–100.0)
Monocytes Absolute: 0.5 K/uL (ref 0.1–1.0)
Monocytes Relative: 8 %
Neutro Abs: 2.8 K/uL (ref 1.7–7.7)
Neutrophils Relative %: 48 %
Platelet Count: 116 K/uL — ABNORMAL LOW (ref 150–400)
RBC: 5.12 MIL/uL (ref 4.22–5.81)
RDW: 13.9 % (ref 11.5–15.5)
WBC Count: 5.9 K/uL (ref 4.0–10.5)
nRBC: 0 % (ref 0.0–0.2)

## 2024-03-21 LAB — SAMPLE TO BLOOD BANK

## 2024-03-21 LAB — LACTATE DEHYDROGENASE: LDH: 165 U/L (ref 98–192)

## 2024-03-21 MED ORDER — IOHEXOL 300 MG/ML  SOLN
100.0000 mL | Freq: Once | INTRAMUSCULAR | Status: AC | PRN
  Administered 2024-03-21: 100 mL via INTRAVENOUS

## 2024-03-21 NOTE — Assessment & Plan Note (Addendum)
 Continue apixaban  5 mg twice daily. Can stop anticoagulation after port removed. Advised not to miss more appointment

## 2024-03-21 NOTE — Progress Notes (Unsigned)
  Cancer Center OFFICE PROGRESS NOTE  Patient Care Team: Patient, [name] - General (General Practice)  33 y.o. male with diagnosis of left seminoma s/p orchiectomy presented for follow up for after recent CT scan for testicular cancer post BEP.   Diagnosis: pT2 [id number] normal TM. 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  MRI showed benign adrenal nodule. Patient declined biopsy initially. Treatment: BEP x3 completed in 10/2023.   O2 saturation 95% on RA.  Discussed results of CT. shotty lymph nodes likely from HIV. He is on active treatment.  Discuss follow up plan. Repeat CT and lab in 6 months, about 2/2 and see me about mid Feb.  Once port removed, can stop anticoagulation. Patient will call for new appointment for port removal. Will ask nurse to call once port is removed.  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 Follow up in 6 months with labs in Feb about 2/2  Follow up in 6 months with CT CAP on 2/2 Follow up with me about mid 2nd week of Feb to allow CT result to be read History of pulmonary embolism Continue apixaban  5 mg twice daily. Can stop anticoagulation after port removed. Advised not to miss more appointment Lymphadenopathy From HIV  Orders Placed This Encounter  Procedures   CT CHEST ABDOMEN PELVIS W CONTRAST    Standing Status:   Future    Expected Date:   09/22/2024    Expiration Date:   03/31/2025    If indicated for the ordered procedure, I authorize the administration of contrast media per Radiology protocol:   Yes    Does the patient have a contrast media/X-ray dye allergy?:   No    Preferred imaging location?:   [facility]    If indicated for the ordered procedure, I authorize the administration of oral contrast media per Radiology protocol:   Yes     Pauletta JAYSON Chihuahua, MD  INTERVAL HISTORY: Patient returns for follow-up. Overall feeling better. He is considering  other job.   Drinking a lot of fluid. No trouble urinating.  No chest pain, short of breath. No residual leg pain from blood clot.  No bloody stool, melena or bleeding from apixaban .  HIV under controlled.  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   04/28/2023 Imaging   US  1. Positive for abnormally enlarged and heterogeneous but vascular Left Testis highly suspicious for Testicular Neoplasm. Recommend Urology consultation. 2. Negative for testicular torsion.  Right testis appears normal.   04/28/2023 Imaging   CT CAP 1. Partially visualized thickening of the skin of the left scrotum. 2. Prominent bilateral axillary lymph nodes, right greater than left. Suspect at least 2 lymph nodes with abnormal cortical thickening in the right axilla. 3. Shotty retroperitoneal, bilateral iliac chain lymph nodes measuring up to 1.1 cm in short axis. 4. Bilateral inguinal lymph nodes with borderline thickened left inguinal lymph nodes measuring up to 1.1 cm in short axis. 5. 1.2 cm right adrenal mass, indeterminate. Attention on follow-up is recommended. Alternatively evaluation with MRI may be considered. 6. Hazy attenuation within the anterior mediastinum without defined mass may represent residual thymus. Attention on follow-up is recommended. 7. Cholelithiasis. 8. L4-L5 bilateral pars articularis defect. 9. 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 10. Further evaluation with axillary and inguinal ultrasound may be considered.   05/02/2023 Surgery   TESTICLE, LEFT, ORCHIECTOMY:  Seminoma, size 6.1 cm  Tumor limited to testis with rete testis and lymphovascular invasion  (pT2)  Germ cell neoplasia in situ is present  Spermatic cord and all margins of resection are negative for tumor      06/25/2023 Imaging   CT AP IMPRESSION: 1. Stable mild periportal, bilateral inguinal, bilateral external iliac  and bilateral common iliac lymphadenopathy. 2. No new or progressive metastatic disease in the abdomen or pelvis. 3. Solid peripheral basilar 2.0 cm right lower lobe pulmonary nodule, new from 04/28/2023 CT, similar to recent 06/22/2023 chest CT, favoring evolving pulmonary infarct related to recent diagnosis of acute pulmonary embolism. Additional patchy consolidation at the posterior right lung base seen on prior chest CT has resolved. 4. Cholelithiasis. 5. Stable bilateral adrenal nodules characterized as benign adenomas on recent MRI, requiring no further imaging follow-up.     07/09/2023 -  Chemotherapy   Patient is on Treatment Plan : TESTICULAR BEP q21d x 3 Cycles     12/21/2023 PET scan   PET Scattered mild lymph node enlargement identified in the axillary regions, pelvic sidewall and inguinal regions. These have relatively low to medium level hypermetabolism on the PET-CT scan. Based on level of uptake and the patient's history of HIV these could be related to the patient's HIV status. Please correlate with the patient's viral load. Malignant nodes are in the differential but based on level of uptake in the distribution as well as the morphologic appearance on CT, is felt to be less likely at this time. Recommend close follow-up.   Fatty liver infiltration. Gallstone. Surgical changes of prior left orchiectomy.   03/21/2024 Imaging   CT CAP Stable shotty lymph nodes in the mediastinum, axillary regions, and pelvis, likely benign and possibly related to HIV. No definite evidence of metastatic disease.   Cholelithiasis. No radiographic evidence of cholecystitis.      PHYSICAL EXAMINATION: ECOG PERFORMANCE STATUS: 0 - Asymptomatic  Vitals:   03/31/24 0845  BP: 117/80  Pulse: 70  Resp: 17  Temp: 97.6 F (36.4 C)  SpO2: 95%   Filed Weights    03/31/24 0845  Weight: (!) 305 lb 4.8 oz (138.5 kg)   GENERAL: alert, no distress and comfortable SKIN: skin color normal and no jaundice LUNGS: normal breathing effort Musculoskeletal: no edema  Relevant data reviewed during this visit included imaging, labs. New labs and imaging ordered.

## 2024-03-21 NOTE — Assessment & Plan Note (Addendum)
 New labs and CT ordered. Follow up in 6 months with labs in Feb about 2/2  Follow up in 6 months with CT CAP on 2/2 Follow up with me about mid 2nd week of Feb to allow CT result to be read

## 2024-03-22 LAB — BETA HCG QUANT (REF LAB): hCG Quant: <1 m[IU]/mL (ref 0–3)

## 2024-03-22 LAB — AFP TUMOR MARKER: AFP, Serum, Tumor Marker: <1.8 ng/mL (ref 0.0–6.9)

## 2024-03-25 ENCOUNTER — Ambulatory Visit: Payer: Self-pay

## 2024-03-31 ENCOUNTER — Inpatient Hospital Stay (HOSPITAL_BASED_OUTPATIENT_CLINIC_OR_DEPARTMENT_OTHER)

## 2024-03-31 VITALS — BP 117/80 | HR 70 | Temp 97.6°F | Resp 17 | Wt 305.3 lb

## 2024-03-31 DIAGNOSIS — C6292 Malignant neoplasm of left testis, unspecified whether descended or undescended: Secondary | ICD-10-CM

## 2024-03-31 DIAGNOSIS — Z86711 Personal history of pulmonary embolism: Secondary | ICD-10-CM

## 2024-03-31 DIAGNOSIS — R591 Generalized enlarged lymph nodes: Secondary | ICD-10-CM

## 2024-04-01 ENCOUNTER — Telehealth: Payer: Self-pay

## 2024-04-01 NOTE — Telephone Encounter (Signed)
 Scheduled appointments with the patient per LOS notes.

## 2024-04-02 ENCOUNTER — Other Ambulatory Visit: Payer: Self-pay

## 2024-04-07 ENCOUNTER — Ambulatory Visit: Admitting: Internal Medicine

## 2024-04-07 NOTE — Progress Notes (Deleted)
 [facility]     HPI: Steve Stewart is a 33 y.o. male presents for HIV management. Missed a couple doses of biktarvy .  Has not been sexually active since last visit.  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  Anal sex receptive***, insertive***. Contraception**** Oral sex, contraception*** Vaginal penile sex, contraception***  Social: Occupation: Housing: Support: Understanding of HIV: Etoh/drug/tobacco use:  Past Medical History:  Diagnosis Date   HIV infection (HCC)    Medical history non-contributory    Testicular cancer (HCC)     Past Surgical History:  Procedure Laterality Date   IR IMAGING GUIDED PORT INSERTION  06/18/2023   NO PAST SURGERIES     ORCHIECTOMY Left 05/02/2023   Procedure: LEFT INGUINAL ORCHIECTOMY;  Surgeon: Devere Lonni Righter, MD;  Location: [facility];  Service: Urology;  Laterality: Left;    No family history on file. Current Outpatient Medications on File Prior to Visit  Medication Sig Dispense Refill   acetaminophen  (TYLENOL ) 650 MG CR tablet Take 1,300 mg by mouth every 8 (eight) hours as needed for pain. (Patient not taking: Reported on 02/27/2024)     albuterol  (VENTOLIN  HFA) 108 (90 Base) MCG/ACT inhaler Inhale 1 puff into the lungs every 6 (six) hours as needed for wheezing or shortness of breath.     ALPRAZolam  (XANAX ) 0.25 MG tablet Take 1 tablet (0.25 mg total) by mouth at bedtime as needed for anxiety or sleep. (Patient not taking: Reported on 02/27/2024) 5 tablet 0   apixaban  (ELIQUIS ) 5 MG TABS tablet Take 1 tablet (5 mg total) by mouth 2 (two) times daily. 180 tablet 0   bictegravir-emtricitabine -tenofovir  AF (BIKTARVY ) 50-200-25 MG TABS tablet Take 1 tablet by mouth daily. 30 tablet 5   dexamethasone  (DECADRON ) 4 MG tablet Take 2 tablets (8 mg total) by mouth daily. Start the day after last cisplatin  dose  on day 6 of chemo x 3 days.Take with food. (Patient not taking: Reported on 02/27/2024) 30 tablet 1   docusate sodium  (COLACE) 100 MG capsule Take 2 capsules (200 mg total) by mouth 2 (two) times daily. (Patient not taking: Reported on 02/27/2024) 20 capsule 0   famotidine  (PEPCID ) 20 MG tablet Take 1 tablet (20 mg total) by mouth 2 (two) times daily. (Patient not taking: Reported on 02/27/2024) 30 tablet 2   lidocaine  (XYLOCAINE ) 2 % solution Use as directed 15 mLs in the mouth or throat every 3 (three) hours as needed for mouth pain. Mix 1 to 1 ratio with Maalox to rinse for pain (Patient not taking: Reported on 02/27/2024) 100 mL 0   lidocaine -prilocaine  (EMLA ) cream Apply to affected area once (Patient not taking: Reported on 02/27/2024) 30 g 3   Melatonin 10 MG TABS Take 1 tablet by mouth at bedtime. (Patient not taking: Reported on 02/27/2024) 30 tablet 0   oxyCODONE -acetaminophen  (PERCOCET) 10-325 MG tablet Take 1 tablet by mouth every 4 (four) hours as needed for pain. (Patient not taking: Reported on 02/27/2024) 30 tablet 0   polyethylene glycol (MIRALAX  / GLYCOLAX ) 17 g packet Take 17 g by mouth daily. (Patient not taking: Reported on 02/27/2024) 14 each 0   prochlorperazine  (COMPAZINE ) 10 MG tablet Take 1 tablet (10 mg total) by mouth every 6 (six) hours as needed for nausea or vomiting. (Patient not taking: Reported on 02/27/2024) 30 tablet 1   traZODone  (DESYREL ) 50 MG  tablet Take 1 tablet (50 mg total) by mouth at bedtime. (Patient not taking: Reported on 02/27/2024) 90 tablet 1   No current facility-administered medications on file prior to visit.    No Known Allergies    Lab Results HIV 1 RNA Quant  Date Value  02/27/2024 <20 DETECTED copies/mL (A)  09/24/2023 <20 Copies/mL (H)  06/28/2023 117,000 copies/mL (H)   CD4 T Cell Abs (/uL)  Date Value  02/27/2024 569  09/24/2023 572  06/28/2023 255 (L)   No results found for: HIV1GENOSEQ Lab Results  Component Value Date   WBC 5.9 03/21/2024    HGB 14.7 03/21/2024   HCT 42.6 03/21/2024   MCV 83.2 03/21/2024   PLT 116 (L) 03/21/2024    Lab Results  Component Value Date   CREATININE 1.39 (H) 03/21/2024   BUN 21 (H) 03/21/2024   NA 138 03/21/2024   K 4.0 03/21/2024   CL 106 03/21/2024   CO2 24 03/21/2024   Lab Results  Component Value Date   ALT 27 03/21/2024   AST 21 03/21/2024   ALKPHOS 55 03/21/2024   BILITOT 0.6 03/21/2024    Lab Results  Component Value Date   CHOL 140 06/28/2023   TRIG 141 06/28/2023   HDL 34 (L) 06/28/2023   LDLCALC 82 06/28/2023   Lab Results  Component Value Date   HAV NON-REACTIVE 06/28/2023   Lab Results  Component Value Date   HEPBSAG NON-REACTIVE 06/28/2023   HEPBSAB NON-REACTIVE 06/28/2023   No results found for: HCVAB Lab Results  Component Value Date   CHLAMYDIAWP Negative 09/24/2023   N Negative 09/24/2023   No results found for: GCPROBEAPT No results found for: QUANTGOLD  Assessment/Plan #HIV/Asymptomatic -CD4 572, Vl nd , on 2/25. 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    #testicular Ca -On BEP, follows with Heme/onc     #Vaccination COVID-declined Flu-declined Monkeypox PCV-declined Meningitis-declined HepA-non immune  06/28/23 HEpB non immune 06/28/23 Tdap 09/08/22 HPV->Today  Shingles   #Health maintenance -Quantiferon negative 06/28/23 -RPR 06/28/23 NR -HCV NR 06/28/23 -GC2/3/25y urine. Pt declined oral and rectal swab -Lipid- , on 06/28/23 Age dependent: -Dysplasia screen M-next time -Colonoscopy  Loney Stank, MD [facility]

## 2024-04-09 ENCOUNTER — Other Ambulatory Visit: Payer: Self-pay

## 2024-04-10 ENCOUNTER — Other Ambulatory Visit: Payer: Self-pay

## 2024-04-10 NOTE — Progress Notes (Signed)
 Specialty Pharmacy Refill Coordination Note  Steve Stewart is a 33 y.o. male contacted today regarding refills of specialty medication(s) Bictegravir-Emtricitab-Tenofov (Biktarvy )   Patient requested [name] at [facility] at [place] date: 04/11/24   Medication will be filled on 04/11/24.

## 2024-04-11 ENCOUNTER — Other Ambulatory Visit (HOSPITAL_COMMUNITY): Payer: Self-pay

## 2024-04-11 ENCOUNTER — Other Ambulatory Visit: Payer: Self-pay

## 2024-04-11 MED ORDER — APIXABAN 5 MG PO TABS
5.0000 mg | ORAL_TABLET | Freq: Two times a day (BID) | ORAL | 0 refills | Status: AC
  Filled 2024-04-11: qty 180, 90d supply, fill #0

## 2024-04-23 ENCOUNTER — Telehealth: Payer: Self-pay

## 2024-04-23 NOTE — Telephone Encounter (Signed)
 Received DME from [facility] for Oxygen POC.  Placed on Dr. Leisa desk in A POC for signature.  Once signed, will fax to [facility] at  [phone]

## 2024-05-07 ENCOUNTER — Other Ambulatory Visit (HOSPITAL_COMMUNITY): Payer: Self-pay

## 2024-05-09 ENCOUNTER — Other Ambulatory Visit: Payer: Self-pay

## 2024-05-12 ENCOUNTER — Other Ambulatory Visit: Payer: Self-pay | Admitting: Pharmacy Technician

## 2024-05-12 ENCOUNTER — Other Ambulatory Visit: Payer: Self-pay

## 2024-05-12 NOTE — Progress Notes (Signed)
 Specialty Pharmacy Refill Coordination Note  Steve Stewart is a 33 y.o. male contacted today regarding refills of specialty medication(s) Bictegravir-Emtricitab-Tenofov (Biktarvy )   Patient requested [name] at [facility] at [place] date: 05/12/24   Medication will be filled on 05/12/24.

## 2024-05-19 ENCOUNTER — Other Ambulatory Visit (HOSPITAL_COMMUNITY): Payer: Self-pay

## 2024-05-29 ENCOUNTER — Other Ambulatory Visit: Payer: Self-pay | Admitting: Pharmacy Technician

## 2024-05-29 ENCOUNTER — Other Ambulatory Visit: Payer: Self-pay

## 2024-05-29 ENCOUNTER — Other Ambulatory Visit (HOSPITAL_COMMUNITY): Payer: Self-pay

## 2024-05-29 NOTE — Progress Notes (Signed)
 Specialty Pharmacy Refill Coordination Note  Steve Stewart is a 33 y.o. male contacted today regarding refills of specialty medication(s) Bictegravir-Emtricitab-Tenofov (Biktarvy )   Patient requested [name] at [facility] at [place] date: 05/29/24   Medication will be filled on 05/29/24.

## 2024-06-25 ENCOUNTER — Other Ambulatory Visit: Payer: Self-pay

## 2024-06-27 ENCOUNTER — Other Ambulatory Visit: Payer: Self-pay

## 2024-07-01 ENCOUNTER — Other Ambulatory Visit: Payer: Self-pay

## 2024-07-01 NOTE — Progress Notes (Signed)
 Specialty Pharmacy Refill Coordination Note  Steve Stewart is a 33 y.o. male contacted today regarding refills of specialty medication(s) Bictegravir-Emtricitab-Tenofov (Biktarvy )   Patient requested [name] at [facility] at [place] date: 07/03/24   Medication will be filled on: 06/30/24

## 2024-07-06 ENCOUNTER — Other Ambulatory Visit (HOSPITAL_COMMUNITY): Payer: Self-pay

## 2024-07-15 ENCOUNTER — Other Ambulatory Visit: Payer: Self-pay

## 2024-07-16 ENCOUNTER — Other Ambulatory Visit: Payer: Self-pay

## 2024-07-16 NOTE — Progress Notes (Signed)
 Patient's medication was returned to stock on 07/06/24 because it was never picked up. Patient called back today for refill. Patient stated he will pick up refill on 11/28.

## 2024-07-19 ENCOUNTER — Other Ambulatory Visit (HOSPITAL_COMMUNITY): Payer: Self-pay

## 2024-07-21 NOTE — Telephone Encounter (Signed)
 LM to [place] to call Dr Fredrik nurse to discuss eliquis  and port removal.   Trysta Showman I told him can stop apixaban  once port removed. Looks like he did not follow up. Would you call him and asked him to follow up. It's better not to be on blood thinner longer than he needs to. We can stop the apixaban  but he should have the port removed as that is a risk factor for future DVT's. He can take a baby aspirin until the port removed. Thanks.

## 2024-07-22 ENCOUNTER — Telehealth: Payer: Self-pay | Admitting: *Deleted

## 2024-07-22 ENCOUNTER — Other Ambulatory Visit: Payer: Self-pay

## 2024-07-22 NOTE — Telephone Encounter (Signed)
 Spoke with Joane. He is going to schedule port removal. Instructed to start taking baby aspirin and stop eliquis .   Explained message from Dr Tina:   Zorita I told him can stop apixaban  once port removed. Looks like he did not follow up. Would you call him and asked him to follow up. It's better not to be on blood thinner longer than he needs to. We can stop the apixaban  but he should have the port removed as that is a risk factor for future DVT's. He can take a baby aspirin until the port removed. Thanks.

## 2024-07-22 NOTE — Telephone Encounter (Signed)
 LM to call to discuss blood thinners.  Spoke with mother. She is going to have him call us .

## 2024-07-24 ENCOUNTER — Inpatient Hospital Stay (HOSPITAL_COMMUNITY): Admission: RE | Admit: 2024-07-24 | Discharge: 2024-07-24

## 2024-07-24 DIAGNOSIS — C6292 Malignant neoplasm of left testis, unspecified whether descended or undescended: Secondary | ICD-10-CM

## 2024-07-24 HISTORY — PX: IR REMOVAL TUN ACCESS W/ PORT W/O FL MOD SED: IMG2290

## 2024-07-24 MED ORDER — LIDOCAINE-EPINEPHRINE 1 %-1:100000 IJ SOLN
20.0000 mL | Freq: Once | INTRAMUSCULAR | Status: AC
  Administered 2024-07-24: 10 mL via INTRADERMAL

## 2024-07-24 MED ORDER — LIDOCAINE-EPINEPHRINE 1 %-1:100000 IJ SOLN
INTRAMUSCULAR | Status: AC
  Filled 2024-07-24: qty 1

## 2024-07-24 NOTE — Procedures (Signed)
 Interventional Radiology Procedure:   Indications: Testicular cancer and port is no longer needed.   Procedure: Port explant  Findings: Complete removal of right chest port   Complications: None     EBL: Minimal   Plan:  Keep port covered and dry for 24 hours   Maxi Rodas R. Philip, MD  Pager: [phone]

## 2024-08-11 ENCOUNTER — Other Ambulatory Visit: Payer: Self-pay

## 2024-08-13 ENCOUNTER — Other Ambulatory Visit: Payer: Self-pay

## 2024-08-13 NOTE — Progress Notes (Signed)
 Specialty Pharmacy Refill Coordination Note  Steve Stewart is a 33 y.o. male contacted today regarding refills of specialty medication(s) Bictegravir-Emtricitab-Tenofov (Biktarvy )   Patient requested [name] at [facility] at [place] date: 08/15/24   Medication will be filled on: 08/15/24

## 2024-08-15 ENCOUNTER — Other Ambulatory Visit: Payer: Self-pay

## 2024-08-19 ENCOUNTER — Other Ambulatory Visit: Payer: Self-pay

## 2024-08-20 ENCOUNTER — Other Ambulatory Visit: Payer: Self-pay

## 2024-08-20 NOTE — Progress Notes (Signed)
 Specialty Pharmacy Ongoing Clinical Assessment Note  Steve Stewart is a 33 y.o. male who is being followed by the specialty pharmacy service for RxSp HIV   Patient's specialty medication(s) reviewed today: Bictegravir-Emtricitab-Tenofov (Biktarvy )   Missed doses in the last 4 weeks: 0   Patient/Caregiver did not have any additional questions or concerns.   Therapeutic benefit summary: Patient is achieving benefit   Adverse events/side effects summary: No adverse events/side effects   Patient's therapy is appropriate to: Continue    Goals Addressed             This Visit's Progress    Achieve Undetectable HIV Viral Load < 20   On track    Patient is on track. Patient will maintain adherence and adhere to provider and/or lab appointments. Patient's viral load was <20 copies/mL as of 02/27/24         Follow up: 12 months  [facility] Specialty Pharmacist

## 2024-08-25 ENCOUNTER — Ambulatory Visit: Admitting: Internal Medicine

## 2024-08-25 ENCOUNTER — Other Ambulatory Visit (HOSPITAL_COMMUNITY): Payer: Self-pay

## 2024-08-25 ENCOUNTER — Encounter: Payer: Self-pay | Admitting: Internal Medicine

## 2024-08-25 ENCOUNTER — Other Ambulatory Visit (HOSPITAL_COMMUNITY)
Admission: RE | Admit: 2024-08-25 | Discharge: 2024-08-25 | Disposition: A | Discharge status: Home / Self Care | Source: Ambulatory Visit | Attending: Internal Medicine | Admitting: Internal Medicine

## 2024-08-25 ENCOUNTER — Other Ambulatory Visit: Payer: Self-pay

## 2024-08-25 DIAGNOSIS — Z21 Asymptomatic human immunodeficiency virus [HIV] infection status: Secondary | ICD-10-CM

## 2024-08-25 DIAGNOSIS — C6290 Malignant neoplasm of unspecified testis, unspecified whether descended or undescended: Secondary | ICD-10-CM

## 2024-08-25 DIAGNOSIS — Z2821 Immunization not carried out because of patient refusal: Secondary | ICD-10-CM

## 2024-08-25 DIAGNOSIS — B20 Human immunodeficiency virus [HIV] disease: Secondary | ICD-10-CM

## 2024-08-25 MED ORDER — BIKTARVY 50-200-25 MG PO TABS
1.0000 | ORAL_TABLET | Freq: Every day | ORAL | 5 refills | Status: AC
  Filled 2024-08-25: qty 30, 30d supply, fill #0
  Filled 2024-09-17: qty 30, 30d supply, fill #1

## 2024-08-25 NOTE — Progress Notes (Signed)
 "      [facility]     HPI: Steve Stewart is a 34 y.o. male presents for HIV management. He is on biktarvy . Missed done dose since last visit. No new sexual partners  Past Medical History:  Diagnosis Date   HIV infection [facility])    Medical history non-contributory    Testicular cancer [facility])     Past Surgical History:  Procedure Laterality Date   IR IMAGING GUIDED PORT INSERTION  06/18/2023   IR REMOVAL TUN ACCESS W/ PORT W/O FL MOD SED  07/24/2024   NO PAST SURGERIES     ORCHIECTOMY Left 05/02/2023   Procedure: LEFT INGUINAL ORCHIECTOMY;  Surgeon: Devere Lonni Righter, MD;  Location: [facility];  Service: Urology;  Laterality: Left;    No family history on file. Medications Ordered Prior to Encounter[1]  Allergies[2]    Lab Results HIV 1 RNA Quant  Date Value  02/27/2024 <20 DETECTED copies/mL (A)  09/24/2023 <20 Copies/mL (H)  06/28/2023 117,000 copies/mL (H)   CD4 T Cell Abs (/uL)  Date Value  02/27/2024 569  09/24/2023 572  06/28/2023 255 (L)   No results found for: HIV1GENOSEQ Lab Results  Component Value Date   WBC 5.9 03/21/2024   HGB 14.7 03/21/2024   HCT 42.6 03/21/2024   MCV 83.2 03/21/2024   PLT 116 (L) 03/21/2024    Lab Results  Component Value Date   CREATININE 1.39 (H) 03/21/2024   BUN 21 (H) 03/21/2024   NA 138 03/21/2024   K 4.0 03/21/2024   CL 106 03/21/2024   CO2 24 03/21/2024   Lab Results  Component Value Date   ALT 27 03/21/2024   AST 21 03/21/2024   ALKPHOS 55 03/21/2024   BILITOT 0.6 03/21/2024    Lab Results  Component Value Date   CHOL 140 06/28/2023   TRIG 141 06/28/2023   HDL 34 (L) 06/28/2023   LDLCALC 82 06/28/2023   Lab Results  Component Value Date   HAV NON-REACTIVE 06/28/2023   Lab Results  Component Value Date   HEPBSAG NON-REACTIVE 06/28/2023   HEPBSAB NON-REACTIVE 06/28/2023   No results found for: HCVAB Lab Results  Component Value Date   CHLAMYDIAWP Negative  09/24/2023   N Negative 09/24/2023   No results found for: GCPROBEAPT No results found for: QUANTGOLD  Assessment/Plan #HIV/Asymptomatic -CD4 569, Vl nd , on 02/27/24. 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    #testicular Ca -On BEP, follows with Heme/onc     #Vaccination COVID-declined Flu-declined Monkeypox PCV-declined Meningitis-declined HepA-non immune  06/28/23 HEpB non immune 06/28/23 Tdap 09/08/22 HPV->02/27/24 Shingles   #Health maintenance -Quantiferon negative 06/28/23 -RPR 06/28/23 NR, today 08/25/24 -HCV NR 06/28/23, today  08/25/24 -GC2/3/25y urine. Pt declined oral and rectal swab Urine todau 08/25/24 -Lipid- , on 06/28/23 Age dependent: -Dysplasia screen M-declined -Colonoscopy       Loney Stank, MD [facility] I personally spent a total of 41 minutes in the care of the patient today including preparing to see the patient, getting/reviewing separately obtained history, performing a medically appropriate exam/evaluation, counseling and educating, placing orders, documenting clinical information in the EHR, independently interpreting results, and communicating results.     [1]  Current Outpatient Medications on File Prior to Visit  Medication Sig Dispense Refill   albuterol  (VENTOLIN  HFA) 108 (90 Base) MCG/ACT  inhaler Inhale 1 puff into the lungs every 6 (six) hours as needed for wheezing or shortness of breath.     bictegravir-emtricitabine -tenofovir  AF (BIKTARVY ) 50-200-25 MG TABS tablet Take 1 tablet by mouth daily. 30 tablet 5   acetaminophen  (TYLENOL ) 650 MG CR tablet Take 1,300 mg by mouth every 8 (eight) hours as needed for pain. (Patient not taking: Reported on 08/25/2024)     ALPRAZolam  (XANAX ) 0.25 MG tablet Take 1 tablet (0.25 mg total) by mouth at bedtime as needed for anxiety or sleep. (Patient not  taking: Reported on 08/25/2024) 5 tablet 0   apixaban  (ELIQUIS ) 5 MG TABS tablet Take 1 tablet (5 mg total) by mouth 2 (two) times daily. (Patient not taking: Reported on 08/25/2024) 180 tablet 0   dexamethasone  (DECADRON ) 4 MG tablet Take 2 tablets (8 mg total) by mouth daily. Start the day after last cisplatin  dose on day 6 of chemo x 3 days.Take with food. (Patient not taking: Reported on 08/25/2024) 30 tablet 1   docusate sodium  (COLACE) 100 MG capsule Take 2 capsules (200 mg total) by mouth 2 (two) times daily. (Patient not taking: Reported on 08/25/2024) 20 capsule 0   famotidine  (PEPCID ) 20 MG tablet Take 1 tablet (20 mg total) by mouth 2 (two) times daily. (Patient not taking: Reported on 08/25/2024) 30 tablet 2   lidocaine  (XYLOCAINE ) 2 % solution Use as directed 15 mLs in the mouth or throat every 3 (three) hours as needed for mouth pain. Mix 1 to 1 ratio with Maalox to rinse for pain (Patient not taking: Reported on 08/25/2024) 100 mL 0   lidocaine -prilocaine  (EMLA ) cream Apply to affected area once (Patient not taking: Reported on 08/25/2024) 30 g 3   Melatonin 10 MG TABS Take 1 tablet by mouth at bedtime. (Patient not taking: Reported on 08/25/2024) 30 tablet 0   polyethylene glycol (MIRALAX  / GLYCOLAX ) 17 g packet Take 17 g by mouth daily. (Patient not taking: Reported on 08/25/2024) 14 each 0   prochlorperazine  (COMPAZINE ) 10 MG tablet Take 1 tablet (10 mg total) by mouth every 6 (six) hours as needed for nausea or vomiting. (Patient not taking: Reported on 08/25/2024) 30 tablet 1   traZODone  (DESYREL ) 50 MG tablet Take 1 tablet (50 mg total) by mouth at bedtime. (Patient not taking: Reported on 08/25/2024) 90 tablet 1   No current facility-administered medications on file prior to visit.  [2] No Known Allergies  "

## 2024-08-26 ENCOUNTER — Other Ambulatory Visit: Payer: Self-pay

## 2024-08-26 LAB — T-HELPER CELLS (CD4) COUNT (NOT AT ARMC)
CD4 % Helper T Cell: 30 % — ABNORMAL LOW (ref 33–65)
CD4 T Cell Abs: 782 /uL (ref 400–1790)

## 2024-08-27 LAB — CBC WITH DIFFERENTIAL/PLATELET
Absolute Lymphocytes: 2844 {cells}/uL (ref 850–3900)
Absolute Monocytes: 547 {cells}/uL (ref 200–950)
Basophils Absolute: 29 {cells}/uL (ref 0–200)
Basophils Relative: 0.4 %
Eosinophils Absolute: 108 {cells}/uL (ref 15–500)
Eosinophils Relative: 1.5 %
HCT: 42.5 % (ref 39.4–51.1)
Hemoglobin: 13.9 g/dL (ref 13.2–17.1)
MCH: 28.5 pg (ref 27.0–33.0)
MCHC: 32.7 g/dL (ref 31.6–35.4)
MCV: 87.3 fL (ref 81.4–101.7)
MPV: 11.8 fL (ref 7.5–12.5)
Monocytes Relative: 7.6 %
Neutro Abs: 3672 {cells}/uL (ref 1500–7800)
Neutrophils Relative %: 51 %
Platelets: 189 Thousand/uL (ref 140–400)
RBC: 4.87 Million/uL (ref 4.20–5.80)
RDW: 14.4 % (ref 11.0–15.0)
Total Lymphocyte: 39.5 %
WBC: 7.2 Thousand/uL (ref 3.8–10.8)

## 2024-08-27 LAB — URINE CYTOLOGY ANCILLARY ONLY
Chlamydia: NEGATIVE
Comment: NEGATIVE
Comment: NORMAL
Neisseria Gonorrhea: NEGATIVE

## 2024-08-27 LAB — HIV-1 RNA QUANT-NO REFLEX-BLD
HIV 1 RNA Quant: NOT DETECTED {copies}/mL
HIV-1 RNA Quant, Log: NOT DETECTED {Log_copies}/mL

## 2024-08-27 LAB — COMPLETE METABOLIC PANEL WITHOUT GFR
AG Ratio: 1.7 (calc) (ref 1.0–2.5)
ALT: 25 U/L (ref 9–46)
AST: 13 U/L (ref 10–40)
Albumin: 4.3 g/dL (ref 3.6–5.1)
Alkaline phosphatase (APISO): 70 U/L (ref 36–130)
BUN: 14 mg/dL (ref 7–25)
CO2: 26 mmol/L (ref 20–32)
Calcium: 9 mg/dL (ref 8.6–10.3)
Chloride: 104 mmol/L (ref 98–110)
Creat: 1 mg/dL (ref 0.60–1.26)
Globulin: 2.6 g/dL (ref 1.9–3.7)
Glucose, Bld: 89 mg/dL (ref 65–99)
Potassium: 4.5 mmol/L (ref 3.5–5.3)
Sodium: 138 mmol/L (ref 135–146)
Total Bilirubin: 0.2 mg/dL (ref 0.2–1.2)
Total Protein: 6.9 g/dL (ref 6.1–8.1)

## 2024-08-27 LAB — HEPATITIS C ANTIBODY: Hepatitis C Ab: NONREACTIVE

## 2024-08-27 LAB — SYPHILIS: RPR W/REFLEX TO RPR TITER AND TREPONEMAL ANTIBODIES, TRADITIONAL SCREENING AND DIAGNOSIS ALGORITHM: RPR Ser Ql: NONREACTIVE

## 2024-08-31 ENCOUNTER — Ambulatory Visit: Payer: Self-pay | Admitting: Internal Medicine

## 2024-09-15 ENCOUNTER — Other Ambulatory Visit: Payer: Self-pay

## 2024-09-17 ENCOUNTER — Other Ambulatory Visit: Payer: Self-pay

## 2024-09-19 ENCOUNTER — Other Ambulatory Visit: Payer: Self-pay

## 2024-09-22 ENCOUNTER — Inpatient Hospital Stay

## 2024-09-22 ENCOUNTER — Ambulatory Visit (HOSPITAL_COMMUNITY)

## 2024-09-23 ENCOUNTER — Other Ambulatory Visit (HOSPITAL_COMMUNITY): Payer: Self-pay

## 2024-09-23 NOTE — Progress Notes (Signed)
 Specialty Pharmacy Refill Coordination Note  Steve Stewart is a 34 y.o. male contacted today regarding refills of specialty medication(s) Bictegravir-Emtricitab-Tenofov (Biktarvy )   Patient requested [name] at [facility] at [place] date: 09/25/24   Medication will be filled on: 09/24/24

## 2024-09-24 ENCOUNTER — Other Ambulatory Visit: Payer: Self-pay

## 2024-09-25 ENCOUNTER — Ambulatory Visit (HOSPITAL_COMMUNITY): Admission: RE | Admit: 2024-09-25 | Discharge: 2024-09-25

## 2024-09-25 ENCOUNTER — Inpatient Hospital Stay

## 2024-09-25 DIAGNOSIS — C6292 Malignant neoplasm of left testis, unspecified whether descended or undescended: Secondary | ICD-10-CM

## 2024-09-25 DIAGNOSIS — R591 Generalized enlarged lymph nodes: Secondary | ICD-10-CM

## 2024-09-25 LAB — CBC WITH DIFFERENTIAL (CANCER CENTER ONLY)
Abs Immature Granulocytes: 0 10*3/uL (ref 0.00–0.07)
Basophils Absolute: 0 10*3/uL (ref 0.0–0.1)
Basophils Relative: 0 %
Eosinophils Absolute: 0.1 10*3/uL (ref 0.0–0.5)
Eosinophils Relative: 2 %
HCT: 42.1 % (ref 39.0–52.0)
Hemoglobin: 14.6 g/dL (ref 13.0–17.0)
Immature Granulocytes: 0 %
Lymphocytes Relative: 45 %
Lymphs Abs: 2.7 10*3/uL (ref 0.7–4.0)
MCH: 29.6 pg (ref 26.0–34.0)
MCHC: 34.7 g/dL (ref 30.0–36.0)
MCV: 85.4 fL (ref 80.0–100.0)
Monocytes Absolute: 0.5 10*3/uL (ref 0.1–1.0)
Monocytes Relative: 8 %
Neutro Abs: 2.7 10*3/uL (ref 1.7–7.7)
Neutrophils Relative %: 45 %
Platelet Count: 144 10*3/uL — ABNORMAL LOW (ref 150–400)
RBC: 4.93 MIL/uL (ref 4.22–5.81)
RDW: 14.3 % (ref 11.5–15.5)
WBC Count: 6 10*3/uL (ref 4.0–10.5)
nRBC: 0 % (ref 0.0–0.2)

## 2024-09-25 LAB — CMP (CANCER CENTER ONLY)
ALT: 29 U/L (ref 0–44)
AST: 21 U/L (ref 15–41)
Albumin: 4.4 g/dL (ref 3.5–5.0)
Alkaline Phosphatase: 73 U/L (ref 38–126)
Anion gap: 11 (ref 5–15)
BUN: 18 mg/dL (ref 6–20)
CO2: 24 mmol/L (ref 22–32)
Calcium: 9.7 mg/dL (ref 8.9–10.3)
Chloride: 103 mmol/L (ref 98–111)
Creatinine: 1.13 mg/dL (ref 0.61–1.24)
GFR, Estimated: >60 mL/min
Glucose, Bld: 95 mg/dL (ref 70–99)
Potassium: 4.3 mmol/L (ref 3.5–5.1)
Sodium: 137 mmol/L (ref 135–145)
Total Bilirubin: 0.3 mg/dL (ref 0.0–1.2)
Total Protein: 7.3 g/dL (ref 6.5–8.1)

## 2024-09-25 LAB — LACTATE DEHYDROGENASE: LDH: 169 U/L (ref 105–235)

## 2024-09-25 MED ORDER — IOHEXOL 300 MG/ML  SOLN
100.0000 mL | Freq: Once | INTRAMUSCULAR | Status: AC | PRN
  Administered 2024-09-25: 100 mL via INTRAVENOUS

## 2024-09-26 ENCOUNTER — Ambulatory Visit: Payer: Self-pay

## 2024-09-26 LAB — BETA HCG QUANT (REF LAB): hCG Quant: <1 m[IU]/mL (ref 0–3)

## 2024-09-26 LAB — AFP TUMOR MARKER: AFP, Serum, Tumor Marker: <1.8 ng/mL (ref 0.0–6.9)

## 2024-10-06 ENCOUNTER — Ambulatory Visit

## 2025-02-23 ENCOUNTER — Ambulatory Visit: Payer: Self-pay | Admitting: Internal Medicine
